# Patient Record
Sex: Male | Born: 1970 | Race: White | Hispanic: No | Smoking: Current every day smoker
Health system: Southern US, Community
[De-identification: ages and names within clinical notes are randomized; demographics above are authoritative.]

## PROBLEM LIST (undated history)

## (undated) DIAGNOSIS — I10 Essential (primary) hypertension: Secondary | ICD-10-CM

## (undated) DIAGNOSIS — I639 Cerebral infarction, unspecified: Secondary | ICD-10-CM

## (undated) DIAGNOSIS — E119 Type 2 diabetes mellitus without complications: Secondary | ICD-10-CM

## (undated) HISTORY — PX: BACK SURGERY: SHX140

## (undated) HISTORY — PX: APPENDECTOMY: SHX54

---

## 2002-09-10 ENCOUNTER — Ambulatory Visit (HOSPITAL_COMMUNITY): Admission: RE | Admit: 2002-09-10 | Discharge: 2002-09-10 | Payer: Self-pay | Admitting: Neurosurgery

## 2002-09-10 ENCOUNTER — Encounter: Payer: Self-pay | Admitting: Neurosurgery

## 2003-01-08 ENCOUNTER — Encounter: Payer: Self-pay | Admitting: Neurosurgery

## 2003-01-08 ENCOUNTER — Ambulatory Visit (HOSPITAL_COMMUNITY): Admission: RE | Admit: 2003-01-08 | Discharge: 2003-01-08 | Payer: Self-pay | Admitting: Neurosurgery

## 2003-03-04 ENCOUNTER — Ambulatory Visit (HOSPITAL_COMMUNITY): Admission: RE | Admit: 2003-03-04 | Discharge: 2003-03-04 | Payer: Self-pay | Admitting: Neurosurgery

## 2003-04-06 ENCOUNTER — Ambulatory Visit (HOSPITAL_COMMUNITY): Admission: RE | Admit: 2003-04-06 | Discharge: 2003-04-07 | Payer: Self-pay | Admitting: Neurosurgery

## 2003-05-28 ENCOUNTER — Encounter: Admission: RE | Admit: 2003-05-28 | Discharge: 2003-05-28 | Payer: Self-pay | Admitting: Neurosurgery

## 2004-03-20 ENCOUNTER — Emergency Department: Payer: Self-pay | Admitting: Internal Medicine

## 2004-09-21 ENCOUNTER — Emergency Department: Payer: Self-pay | Admitting: Emergency Medicine

## 2006-01-26 ENCOUNTER — Ambulatory Visit: Payer: Self-pay | Admitting: Unknown Physician Specialty

## 2008-01-27 ENCOUNTER — Ambulatory Visit: Payer: Self-pay | Admitting: Specialist

## 2011-01-14 ENCOUNTER — Emergency Department: Payer: Self-pay | Admitting: Emergency Medicine

## 2011-01-16 ENCOUNTER — Emergency Department: Payer: Self-pay | Admitting: Emergency Medicine

## 2013-03-09 ENCOUNTER — Emergency Department: Payer: Self-pay | Admitting: Internal Medicine

## 2013-03-09 LAB — ETHANOL
Ethanol %: <0.003 % (ref 0.000–0.080)
Ethanol: <3 mg/dL

## 2013-03-09 LAB — DRUG SCREEN, URINE

## 2013-03-09 LAB — CK TOTAL AND CKMB (NOT AT ARMC)
CK, Total: 145 U/L (ref 35–232)
CK-MB: 1.5 ng/mL (ref 0.5–3.6)

## 2013-03-09 LAB — COMPREHENSIVE METABOLIC PANEL
Albumin: 3.7 g/dL (ref 3.4–5.0)
Alkaline Phosphatase: 111 U/L (ref 50–136)
Anion Gap: 5 — ABNORMAL LOW (ref 7–16)
BUN: 15 mg/dL (ref 7–18)
Bilirubin,Total: 0.5 mg/dL (ref 0.2–1.0)
Calcium, Total: 9.2 mg/dL (ref 8.5–10.1)
Chloride: 97 mmol/L — ABNORMAL LOW (ref 98–107)
Co2: 30 mmol/L (ref 21–32)
Creatinine: 1.01 mg/dL (ref 0.60–1.30)
EGFR (African American): >60
EGFR (Non-African Amer.): >60
Glucose: 392 mg/dL — ABNORMAL HIGH (ref 65–99)
Osmolality: 282 (ref 275–301)
Potassium: 3.5 mmol/L (ref 3.5–5.1)
SGOT(AST): 30 U/L (ref 15–37)
SGPT (ALT): 57 U/L (ref 12–78)
Sodium: 132 mmol/L — ABNORMAL LOW (ref 136–145)
Total Protein: 7.1 g/dL (ref 6.4–8.2)

## 2013-03-09 LAB — CBC
HCT: 42.5 % (ref 40.0–52.0)
HGB: 15 g/dL (ref 13.0–18.0)
MCH: 30.5 pg (ref 26.0–34.0)
MCHC: 35.3 g/dL (ref 32.0–36.0)
MCV: 86 fL (ref 80–100)
Platelet: 192 10*3/uL (ref 150–440)
RBC: 4.92 10*6/uL (ref 4.40–5.90)
RDW: 12.2 % (ref 11.5–14.5)
WBC: 11 10*3/uL — ABNORMAL HIGH (ref 3.8–10.6)

## 2013-03-09 LAB — ACETAMINOPHEN LEVEL: Acetaminophen: <2 ug/mL

## 2013-03-09 LAB — TROPONIN I: Troponin-I: 0.02 ng/mL

## 2013-03-09 LAB — SALICYLATE LEVEL: Salicylates, Serum: <1.7 mg/dL

## 2013-03-09 LAB — HEMOGLOBIN A1C: Hemoglobin A1C: 12.8 % — ABNORMAL HIGH (ref 4.2–6.3)

## 2013-03-09 LAB — TSH: Thyroid Stimulating Horm: 1.08 u[IU]/mL

## 2016-09-26 ENCOUNTER — Emergency Department: Payer: Self-pay

## 2016-09-26 ENCOUNTER — Encounter: Payer: Self-pay | Admitting: Emergency Medicine

## 2016-09-26 ENCOUNTER — Emergency Department
Admission: EM | Admit: 2016-09-26 | Discharge: 2016-09-26 | Disposition: A | Discharge status: Home / Self Care | Payer: Self-pay | Attending: Emergency Medicine | Admitting: Emergency Medicine

## 2016-09-26 DIAGNOSIS — F17200 Nicotine dependence, unspecified, uncomplicated: Secondary | ICD-10-CM | POA: Insufficient documentation

## 2016-09-26 DIAGNOSIS — R739 Hyperglycemia, unspecified: Secondary | ICD-10-CM

## 2016-09-26 DIAGNOSIS — Z794 Long term (current) use of insulin: Secondary | ICD-10-CM | POA: Insufficient documentation

## 2016-09-26 DIAGNOSIS — N23 Unspecified renal colic: Secondary | ICD-10-CM

## 2016-09-26 DIAGNOSIS — E1165 Type 2 diabetes mellitus with hyperglycemia: Secondary | ICD-10-CM | POA: Insufficient documentation

## 2016-09-26 DIAGNOSIS — I10 Essential (primary) hypertension: Secondary | ICD-10-CM | POA: Insufficient documentation

## 2016-09-26 DIAGNOSIS — Z7984 Long term (current) use of oral hypoglycemic drugs: Secondary | ICD-10-CM | POA: Insufficient documentation

## 2016-09-26 DIAGNOSIS — Z79899 Other long term (current) drug therapy: Secondary | ICD-10-CM | POA: Insufficient documentation

## 2016-09-26 DIAGNOSIS — R109 Unspecified abdominal pain: Secondary | ICD-10-CM | POA: Insufficient documentation

## 2016-09-26 HISTORY — DX: Essential (primary) hypertension: I10

## 2016-09-26 HISTORY — DX: Type 2 diabetes mellitus without complications: E11.9

## 2016-09-26 HISTORY — DX: Cerebral infarction, unspecified: I63.9

## 2016-09-26 LAB — COMPREHENSIVE METABOLIC PANEL
ALT: 34 U/L (ref 17–63)
AST: 25 U/L (ref 15–41)
Albumin: 4 g/dL (ref 3.5–5.0)
Alkaline Phosphatase: 106 U/L (ref 38–126)
Anion gap: 11 (ref 5–15)
BUN: 14 mg/dL (ref 6–20)
CO2: 29 mmol/L (ref 22–32)
Calcium: 10.2 mg/dL (ref 8.9–10.3)
Chloride: 91 mmol/L — ABNORMAL LOW (ref 101–111)
Creatinine, Ser: 1.28 mg/dL — ABNORMAL HIGH (ref 0.61–1.24)
GFR calc Af Amer: >60 mL/min (ref >60)
GFR calc non Af Amer: >60 mL/min (ref >60)
Glucose, Bld: 544 mg/dL (ref 65–99)
Potassium: 3.9 mmol/L (ref 3.5–5.1)
Sodium: 131 mmol/L — ABNORMAL LOW (ref 135–145)
Total Bilirubin: 0.8 mg/dL (ref 0.3–1.2)
Total Protein: 8.3 g/dL — ABNORMAL HIGH (ref 6.5–8.1)

## 2016-09-26 LAB — TROPONIN I
Troponin I: 0.03 ng/mL (ref <0.03)
Troponin I: <0.03 ng/mL (ref <0.03)

## 2016-09-26 LAB — URINALYSIS, COMPLETE (UACMP) WITH MICROSCOPIC
Bacteria, UA: NONE SEEN
Bilirubin Urine: NEGATIVE
Glucose, UA: >=500 mg/dL — AB
Ketones, ur: NEGATIVE mg/dL
Leukocytes, UA: NEGATIVE
Nitrite: NEGATIVE
Protein, ur: 100 mg/dL — AB
Specific Gravity, Urine: 1.021 (ref 1.005–1.030)
Squamous Epithelial / HPF: NONE SEEN
pH: 6 (ref 5.0–8.0)

## 2016-09-26 LAB — GLUCOSE, CAPILLARY: Glucose-Capillary: 394 mg/dL — ABNORMAL HIGH (ref 65–99)

## 2016-09-26 LAB — CBC
HCT: 46.3 % (ref 40.0–52.0)
Hemoglobin: 16.1 g/dL (ref 13.0–18.0)
MCH: 29.7 pg (ref 26.0–34.0)
MCHC: 34.8 g/dL (ref 32.0–36.0)
MCV: 85.4 fL (ref 80.0–100.0)
Platelets: 295 10*3/uL (ref 150–440)
RBC: 5.43 MIL/uL (ref 4.40–5.90)
RDW: 12.9 % (ref 11.5–14.5)
WBC: 13.6 10*3/uL — ABNORMAL HIGH (ref 3.8–10.6)

## 2016-09-26 LAB — LIPASE, BLOOD: Lipase: 42 U/L (ref 11–51)

## 2016-09-26 MED ORDER — INSULIN ASPART 100 UNIT/ML ~~LOC~~ SOLN
10.0000 [IU] | Freq: Once | SUBCUTANEOUS | Status: AC
  Administered 2016-09-26: 10 [IU] via INTRAVENOUS
  Filled 2016-09-26: qty 10

## 2016-09-26 MED ORDER — MORPHINE SULFATE (PF) 4 MG/ML IV SOLN: 4.0000 mg | Freq: Once | INTRAVENOUS | Status: DC

## 2016-09-26 MED ORDER — ONDANSETRON HCL 4 MG PO TABS: 4.0000 mg | ORAL_TABLET | Freq: Three times a day (TID) | ORAL | 0 refills | Status: DC | PRN

## 2016-09-26 MED ORDER — IOPAMIDOL (ISOVUE-300) INJECTION 61%
100.0000 mL | Freq: Once | INTRAVENOUS | Status: AC | PRN
  Administered 2016-09-26: 100 mL via INTRAVENOUS

## 2016-09-26 MED ORDER — IOPAMIDOL (ISOVUE-300) INJECTION 61%
30.0000 mL | Freq: Once | INTRAVENOUS | Status: AC | PRN
  Administered 2016-09-26: 30 mL via ORAL

## 2016-09-26 MED ORDER — SODIUM CHLORIDE 0.9 % IV BOLUS (SEPSIS)
1000.0000 mL | Freq: Once | INTRAVENOUS | Status: AC
  Administered 2016-09-26: 1000 mL via INTRAVENOUS

## 2016-09-26 NOTE — ED Notes (Signed)
Pt alert and oriented X4, active, cooperative, pt in NAD. RR even and unlabored, color WNL.  Pt informed to return if any life threatening symptoms occur.   

## 2016-09-26 NOTE — ED Notes (Signed)
Blood Sugar 544, results from lab.  To room 2.  Morrie Sheldon RN aware of lab results and arrival in Room 2.

## 2016-09-26 NOTE — ED Notes (Signed)
Resumed care from laura rn/heather rn.   Pt up to bathroom.  Iv in place. Pt alert.

## 2016-09-26 NOTE — Discharge Instructions (Signed)
Return to the emergency room for any new or worrisome symptoms including increased pain, fever, vomiting, keep your  sugar closely watched, take your medications as prescribed and follow closely with primary care doctor tomorrow. If you cannot reach them, we have also provided the number for the local clinic. We are also giving you nausea medication. We are also giving information as needed with a urologist although you may wish to see her primary care doctor first.

## 2016-09-26 NOTE — ED Provider Notes (Signed)
[facility] Emergency Department Provider Note  ____________________________________________   I have reviewed the triage vital signs and the nursing notes.   HISTORY  Chief Complaint Abdominal Pain    HPI [name] [name] is a 46 y.o. male history of poorly 90 status post appendectomy years ago presents with right-sided middle abdominal pain. Nausea no fever no chills no vomiting, no diarrhea. Normal bowel movements. States he just doesn't feel very well. Denies chest pain shortness of breath. The pain is in the low to mid right abdomen. It is sharp, nothing makes it better, nothing makes it worse. Patient takes insulin at night and then metformin during the day. He is not sure what his normal sugars are. He states he may not have been taking his medications in the last couple days per the pain was sharp, sudden onset. Did not radiate towards the groin. No dysuria no fever.  Past Medical History:  Diagnosis Date  . Diabetes mellitus without complication (HCC)   . Hypertension   . Stroke [facility])     There are no active problems to display for this patient.   Past Surgical History:  Procedure Laterality Date  . APPENDECTOMY    . BACK SURGERY      Prior to Admission medications   Medication Sig Start Date End Date Taking? Authorizing Provider  amLODipine (NORVASC) 10 MG tablet Take 10 mg by mouth daily.   Yes [provider]  atenolol (TENORMIN) 50 MG tablet Take 50 mg by mouth daily.   Yes [provider]  atorvastatin (LIPITOR) 80 MG tablet Take 80 mg by mouth daily.   Yes [provider]  chlorthalidone (HYGROTON) 50 MG tablet Take 50 mg by mouth daily.   Yes [provider]  citalopram (CELEXA) 40 MG tablet Take 40 mg by mouth daily.   Yes [provider]  folic acid (FOLVITE) 1 MG tablet Take 1 mg by mouth daily.   Yes [provider]  insulin glargine (LANTUS) 100 UNIT/ML injection Inject 50 Units  into the skin daily.   Yes [provider]  lisinopril (PRINIVIL,ZESTRIL) 40 MG tablet Take 40 mg by mouth daily.   Yes [provider]  metFORMIN (GLUCOPHAGE) 500 MG tablet Take 2,000 mg by mouth daily with breakfast.   Yes [provider]  sertraline (ZOLOFT) 100 MG tablet Take 100 mg by mouth daily.   Yes [provider]    Allergies Penicillins  No family history on file.  Social History Social History  Substance Use Topics  . Smoking status: Current Every Day Smoker  . Smokeless tobacco: Never Used  . Alcohol use No    Review of Systems Constitutional: No fever/chills Eyes: No visual changes. ENT: No sore throat. No stiff neck no neck pain Cardiovascular: Denies chest pain. Respiratory: Denies shortness of breath. Gastrointestinal:   no vomiting.  No diarrhea.  No constipation. Genitourinary: Negative for dysuria. Musculoskeletal: Negative lower extremity swelling Skin: Negative for rash. Neurological: Negative for severe headaches, focal weakness or numbness.   ____________________________________________   PHYSICAL EXAM:  VITAL SIGNS: ED Triage Vitals  Enc Vitals Group     BP 09/26/16 1014 (!) 186/92     Pulse Rate 09/26/16 1014 78     Resp 09/26/16 1014 20     Temp 09/26/16 1014 97.5 F (36.4 C)     Temp src --      SpO2 09/26/16 1014 98 %     Weight 09/26/16 1015 184  lb (83.5 kg)     Height 09/26/16 1015 5\' 9"  (1.753 m)     Head Circumference --      Peak Flow --      Pain Score 09/26/16 1023 4     Pain Loc --      Pain Edu? --      Excl. in GC? --     Constitutional: Alert and oriented. Well appearing and in no acute distress. Eyes: Conjunctivae are normal Head: Atraumatic HEENT: No congestion/rhinnorhea. Mucous membranes are moist.  Oropharynx non-erythematous Neck:   Nontender with no meningismus, no masses, no stridor Cardiovascular: Normal rate, regular rhythm. Grossly normal heart sounds.  Good peripheral  circulation. Respiratory: Normal respiratory effort.  No retractions. Lungs CTAB. Abdominal: Positive tenderness to palpation in the mid abdominal region not exactly right upper quadrant not right lower quadrant nontender. No distention. No guarding no rebound Back:  There is no focal tenderness or step off.  there is no midline tenderness there are no lesions noted. there is Slight right CVA tenderness Musculoskeletal: No lower extremity tenderness, no upper extremity tenderness. No joint effusions, no DVT signs strong distal pulses no edema Neurologic:  Normal speech and language. No gross focal neurologic deficits are appreciated.  Skin:  Skin is warm, dry and intact. No rash noted. Psychiatric: Mood and affect are normal. Speech and behavior are normal.  ____________________________________________   LABS (all labs ordered are listed, but only abnormal results are displayed)  Labs Reviewed  COMPREHENSIVE METABOLIC PANEL - Abnormal; Notable for the following:       Result Value   Sodium 131 (*)    Chloride 91 (*)    Glucose, Bld 544 (*)    Creatinine, Ser 1.28 (*)    Total Protein 8.3 (*)    All other components within normal limits  CBC - Abnormal; Notable for the following:    WBC 13.6 (*)    All other components within normal limits  URINALYSIS, COMPLETE (UACMP) WITH MICROSCOPIC - Abnormal; Notable for the following:    Color, Urine YELLOW (*)    APPearance CLEAR (*)    Glucose, UA >=500 (*)    Hgb urine dipstick SMALL (*)    Protein, ur 100 (*)    All other components within normal limits  GLUCOSE, CAPILLARY - Abnormal; Notable for the following:    Glucose-Capillary 394 (*)    All other components within normal limits  LIPASE, BLOOD  TROPONIN I   ____________________________________________  EKG  I personally interpreted any EKGs ordered by me or triage Normal sinus rhythm rate 66 bpm, repolarization abnormality noted. Normal axis no acute  ischemia ____________________________________________  RADIOLOGY  I reviewed any imaging ordered by me or triage that were performed during my shift and, if possible, patient and/or family made aware of any abnormal findings. ____________________________________________   PROCEDURES  Procedure(s) performed: None  Procedures  Critical Care performed: None  ____________________________________________   INITIAL IMPRESSION / ASSESSMENT AND PLAN / ED COURSE  Pertinent labs & imaging results that were available during my care of the patient were reviewed by me and considered in my medical decision making (see chart for details).  Patient here with sudden onset right-sided abdominal pain and elevated sugar. Ultrasound gallbladder is reassuring blood work is reassuring evidence of DKA. We did give him insulin and fluids his sugars come down considerably, he is asymptomatic with this. CT scan was performed given elevated sugars, and risk factors for undiagnosed abdominal pathology, this shows  likely passed kidney stone. Very low suspicion for ACS given reproducible plane and CT findings however, we did do cardiac enzymes which are not going up. CT scan findings of possible passed kidney stone are consistent with symptoms which were sudden onset severe right-sided abdominal pain which is now greatly reduced. He has no tenderness or pain at this moment. He is eager to go home. We will discharge him with nausea medication and return precautions given and understood. No evidence of urinary tract infection noted.    ____________________________________________   FINAL CLINICAL IMPRESSION(S) / ED DIAGNOSES  Final diagnoses:  Abdominal pain      This chart was dictated using voice recognition software.  Despite best efforts to proofread,  errors can occur which can change meaning.      Jeanmarie Plant, MD 09/26/16 [phone]

## 2016-09-26 NOTE — ED Triage Notes (Signed)
Pt with right side abd pain started yesterday,

## 2016-12-25 ENCOUNTER — Emergency Department (HOSPITAL_COMMUNITY): Payer: Non-veteran care

## 2016-12-25 ENCOUNTER — Inpatient Hospital Stay (HOSPITAL_COMMUNITY): Payer: Non-veteran care

## 2016-12-25 ENCOUNTER — Encounter (HOSPITAL_COMMUNITY): Payer: Self-pay | Admitting: Emergency Medicine

## 2016-12-25 ENCOUNTER — Inpatient Hospital Stay (HOSPITAL_COMMUNITY)
Admission: EM | Admit: 2016-12-25 | Discharge: 2017-01-02 | DRG: 062 | Disposition: A | Discharge status: Skilled Nursing Facility | Payer: Self-pay | Attending: Neurology | Admitting: Neurology

## 2016-12-25 DIAGNOSIS — Z8673 Personal history of transient ischemic attack (TIA), and cerebral infarction without residual deficits: Secondary | ICD-10-CM | POA: Diagnosis present

## 2016-12-25 DIAGNOSIS — F17210 Nicotine dependence, cigarettes, uncomplicated: Secondary | ICD-10-CM | POA: Diagnosis present

## 2016-12-25 DIAGNOSIS — G8191 Hemiplegia, unspecified affecting right dominant side: Secondary | ICD-10-CM | POA: Diagnosis present

## 2016-12-25 DIAGNOSIS — I6930 Unspecified sequelae of cerebral infarction: Secondary | ICD-10-CM | POA: Diagnosis present

## 2016-12-25 DIAGNOSIS — Z7982 Long term (current) use of aspirin: Secondary | ICD-10-CM

## 2016-12-25 DIAGNOSIS — I639 Cerebral infarction, unspecified: Principal | ICD-10-CM | POA: Diagnosis present

## 2016-12-25 DIAGNOSIS — E785 Hyperlipidemia, unspecified: Secondary | ICD-10-CM

## 2016-12-25 DIAGNOSIS — Z9119 Patient's noncompliance with other medical treatment and regimen: Secondary | ICD-10-CM

## 2016-12-25 DIAGNOSIS — R29706 NIHSS score 6: Secondary | ICD-10-CM | POA: Diagnosis present

## 2016-12-25 DIAGNOSIS — R29810 Facial weakness: Secondary | ICD-10-CM | POA: Diagnosis present

## 2016-12-25 DIAGNOSIS — Z794 Long term (current) use of insulin: Secondary | ICD-10-CM

## 2016-12-25 DIAGNOSIS — E1151 Type 2 diabetes mellitus with diabetic peripheral angiopathy without gangrene: Secondary | ICD-10-CM | POA: Diagnosis present

## 2016-12-25 DIAGNOSIS — E119 Type 2 diabetes mellitus without complications: Secondary | ICD-10-CM

## 2016-12-25 DIAGNOSIS — I10 Essential (primary) hypertension: Secondary | ICD-10-CM

## 2016-12-25 DIAGNOSIS — Z23 Encounter for immunization: Secondary | ICD-10-CM

## 2016-12-25 DIAGNOSIS — E1165 Type 2 diabetes mellitus with hyperglycemia: Secondary | ICD-10-CM | POA: Diagnosis present

## 2016-12-25 DIAGNOSIS — I161 Hypertensive emergency: Secondary | ICD-10-CM | POA: Diagnosis present

## 2016-12-25 DIAGNOSIS — E876 Hypokalemia: Secondary | ICD-10-CM | POA: Diagnosis present

## 2016-12-25 LAB — CBC
HCT: 42.7 % (ref 39.0–52.0)
Hemoglobin: 15.6 g/dL (ref 13.0–17.0)
MCH: 29.9 pg (ref 26.0–34.0)
MCHC: 36.5 g/dL — AB (ref 30.0–36.0)
MCV: 81.8 fL (ref 78.0–100.0)
PLATELETS: 249 10*3/uL (ref 150–400)
RBC: 5.22 MIL/uL (ref 4.22–5.81)
RDW: 12.3 % (ref 11.5–15.5)
WBC: 9.4 10*3/uL (ref 4.0–10.5)

## 2016-12-25 LAB — COMPREHENSIVE METABOLIC PANEL
ALT: 24 U/L (ref 17–63)
ANION GAP: 13 (ref 5–15)
AST: 18 U/L (ref 15–41)
Albumin: 3.8 g/dL (ref 3.5–5.0)
Alkaline Phosphatase: 92 U/L (ref 38–126)
BUN: 9 mg/dL (ref 6–20)
CHLORIDE: 97 mmol/L — AB (ref 101–111)
CO2: 25 mmol/L (ref 22–32)
Calcium: 9.9 mg/dL (ref 8.9–10.3)
Creatinine, Ser: 1.02 mg/dL (ref 0.61–1.24)
GFR calc Af Amer: >60 mL/min (ref >60)
Glucose, Bld: 398 mg/dL — ABNORMAL HIGH (ref 65–99)
POTASSIUM: 3.2 mmol/L — AB (ref 3.5–5.1)
SODIUM: 135 mmol/L (ref 135–145)
Total Bilirubin: 0.6 mg/dL (ref 0.3–1.2)
Total Protein: 7.5 g/dL (ref 6.5–8.1)

## 2016-12-25 LAB — URINALYSIS, ROUTINE W REFLEX MICROSCOPIC
Bilirubin Urine: NEGATIVE
Hgb urine dipstick: NEGATIVE
KETONES UR: NEGATIVE mg/dL
Leukocytes, UA: NEGATIVE
Nitrite: NEGATIVE
SQUAMOUS EPITHELIAL / LPF: NONE SEEN
Specific Gravity, Urine: 1.026 (ref 1.005–1.030)
pH: 6 (ref 5.0–8.0)

## 2016-12-25 LAB — DIFFERENTIAL
BASOS PCT: 0 %
Basophils Absolute: 0 10*3/uL (ref 0.0–0.1)
EOS ABS: 0.1 10*3/uL (ref 0.0–0.7)
Eosinophils Relative: 1 %
Lymphocytes Relative: 27 %
Lymphs Abs: 2.6 10*3/uL (ref 0.7–4.0)
MONO ABS: 0.5 10*3/uL (ref 0.1–1.0)
Monocytes Relative: 5 %
NEUTROS ABS: 6.2 10*3/uL (ref 1.7–7.7)
Neutrophils Relative %: 67 %

## 2016-12-25 LAB — GLUCOSE, CAPILLARY: GLUCOSE-CAPILLARY: 401 mg/dL — AB (ref 65–99)

## 2016-12-25 LAB — I-STAT TROPONIN, ED: TROPONIN I, POC: 0 ng/mL (ref 0.00–0.08)

## 2016-12-25 LAB — I-STAT CHEM 8, ED
BUN: 12 mg/dL (ref 6–20)
CALCIUM ION: 1.1 mmol/L — AB (ref 1.15–1.40)
CHLORIDE: 97 mmol/L — AB (ref 101–111)
CREATININE: 0.8 mg/dL (ref 0.61–1.24)
GLUCOSE: 403 mg/dL — AB (ref 65–99)
HCT: 46 % (ref 39.0–52.0)
Hemoglobin: 15.6 g/dL (ref 13.0–17.0)
Potassium: 4.3 mmol/L (ref 3.5–5.1)
Sodium: 134 mmol/L — ABNORMAL LOW (ref 135–145)
TCO2: 29 mmol/L (ref 22–32)

## 2016-12-25 LAB — RAPID URINE DRUG SCREEN, HOSP PERFORMED
Amphetamines: NOT DETECTED
Barbiturates: NOT DETECTED
Benzodiazepines: NOT DETECTED
COCAINE: NOT DETECTED
OPIATES: NOT DETECTED
Tetrahydrocannabinol: NOT DETECTED

## 2016-12-25 LAB — APTT: aPTT: 22 seconds — ABNORMAL LOW (ref 24–36)

## 2016-12-25 LAB — PROTIME-INR
INR: 0.89
Prothrombin Time: 12 seconds (ref 11.4–15.2)

## 2016-12-25 LAB — ETHANOL: Alcohol, Ethyl (B): <5 mg/dL (ref <5)

## 2016-12-25 MED ORDER — ACETAMINOPHEN 650 MG RE SUPP
650.0000 mg | RECTAL | Status: DC | PRN
Start: 2016-12-25 — End: 2017-01-02

## 2016-12-25 MED ORDER — PANTOPRAZOLE SODIUM 40 MG IV SOLR
40.0000 mg | Freq: Every day | INTRAVENOUS | Status: DC
  Administered 2016-12-26 – 2016-12-27 (×3): 40 mg via INTRAVENOUS
  Filled 2016-12-25 (×3): qty 40

## 2016-12-25 MED ORDER — INSULIN ASPART 100 UNIT/ML ~~LOC~~ SOLN: 2.0000 [IU] | SUBCUTANEOUS | Status: DC

## 2016-12-25 MED ORDER — POTASSIUM CHLORIDE 10 MEQ/100ML IV SOLN
10.0000 meq | INTRAVENOUS | Status: AC
  Administered 2016-12-25 (×2): 10 meq via INTRAVENOUS
  Filled 2016-12-25 (×2): qty 100

## 2016-12-25 MED ORDER — STROKE: EARLY STAGES OF RECOVERY BOOK
Freq: Once | Status: AC
  Administered 2016-12-26
  Filled 2016-12-25: qty 1

## 2016-12-25 MED ORDER — CLEVIDIPINE BUTYRATE 0.5 MG/ML IV EMUL
0.0000 mg/h | INTRAVENOUS | Status: DC
  Administered 2016-12-26: 4 mg/h via INTRAVENOUS
  Administered 2016-12-26: 12 mg/h via INTRAVENOUS
  Administered 2016-12-26 (×2): 10 mg/h via INTRAVENOUS
  Administered 2016-12-26: 6 mg/h via INTRAVENOUS
  Administered 2016-12-26: 4 mg/h via INTRAVENOUS
  Administered 2016-12-26: 12 mg/h via INTRAVENOUS
  Administered 2016-12-26: 4 mg/h via INTRAVENOUS
  Administered 2016-12-26: 12 mg/h via INTRAVENOUS
  Filled 2016-12-25 (×9): qty 50

## 2016-12-25 MED ORDER — ALTEPLASE (STROKE) FULL DOSE INFUSION
0.9000 mg/kg | Freq: Once | INTRAVENOUS | Status: AC
  Administered 2016-12-25: 76 mg via INTRAVENOUS

## 2016-12-25 MED ORDER — SODIUM CHLORIDE 0.9 % IV SOLN
INTRAVENOUS | Status: DC
  Administered 2016-12-25 – 2016-12-29 (×5): via INTRAVENOUS

## 2016-12-25 MED ORDER — ACETAMINOPHEN 325 MG PO TABS
650.0000 mg | ORAL_TABLET | ORAL | Status: DC | PRN
  Administered 2016-12-29 – 2016-12-31 (×2): 650 mg via ORAL
  Filled 2016-12-25 (×3): qty 2

## 2016-12-25 MED ORDER — SODIUM CHLORIDE 0.9 % IV SOLN
50.0000 mL | Freq: Once | INTRAVENOUS | Status: AC
  Administered 2016-12-25: 50 mL via INTRAVENOUS

## 2016-12-25 MED ORDER — CLEVIDIPINE BUTYRATE 0.5 MG/ML IV EMUL
0.0000 mg/h | INTRAVENOUS | Status: DC
  Administered 2016-12-25: 4 mg/h via INTRAVENOUS
  Filled 2016-12-25: qty 50

## 2016-12-25 MED ORDER — LABETALOL HCL 5 MG/ML IV SOLN
20.0000 mg | Freq: Once | INTRAVENOUS | Status: AC
  Administered 2016-12-25: 20 mg via INTRAVENOUS

## 2016-12-25 MED ORDER — ACETAMINOPHEN 160 MG/5ML PO SOLN
650.0000 mg | ORAL | Status: DC | PRN
Start: 2016-12-25 — End: 2017-01-02

## 2016-12-25 MED ORDER — IOPAMIDOL (ISOVUE-370) INJECTION 76%: 50.0000 mL | Freq: Once | INTRAVENOUS | Status: DC | PRN

## 2016-12-25 NOTE — ED Notes (Signed)
tPA ready, working on BP

## 2016-12-25 NOTE — ED Notes (Signed)
Up to 8 on cleviprex, BP 202/131

## 2016-12-25 NOTE — ED Notes (Signed)
SP02 down to 82% on room air when pt removes [place]. Prompted to replace.

## 2016-12-25 NOTE — ED Notes (Signed)
Gum bleeding continues. No other bleeding sites.

## 2016-12-25 NOTE — ED Notes (Signed)
Family at bedside. 

## 2016-12-25 NOTE — ED Notes (Signed)
Up to 16MG  on cleviprex, BP200/126

## 2016-12-25 NOTE — ED Notes (Signed)
Dr. Amada Jupiter aware of progressive weakness in R hand.

## 2016-12-25 NOTE — ED Notes (Signed)
Repositioned.

## 2016-12-25 NOTE — ED Notes (Signed)
TPA start time NOW

## 2016-12-25 NOTE — ED Notes (Signed)
Pt arrives via EMS as a code stroke via [facility]. Was driving in car with sister at onset, began vomiting shortly after slurred speech began.

## 2016-12-25 NOTE — ED Notes (Signed)
Dr. Amada Jupiter at bedside, restart tPA despite gum bleeding. RR RN Verlon Au at bedside.

## 2016-12-25 NOTE — ED Notes (Signed)
cleviprex down to 12

## 2016-12-25 NOTE — ED Notes (Addendum)
Per Dr. Amada Jupiter, avoid anxiety medication at this time with sedating side effects in order to monitor mental status Updated family and patient of this plan. Turned up air, provided cool washcloth and ice chips to relieve anxiety. Coughing a bit on ice chips. Pt aggreeable to this. Opened the curtain to room to [employer].

## 2016-12-25 NOTE — ED Provider Notes (Signed)
[facility]-EMERGENCY DEPT Provider Note   CSN: [id number] Arrival date & time: 12/25/16  1906     History   Chief Complaint No chief complaint on file.   HPI [name] [name] is a 46 y.o. male.  HPI  The pt is a 46 y/o male - has hx of prior ischemic stroke causing L sided weakness - on ASA daily - was normal until 40 minutes prior to arrival at 6:30 PM when he had acute onset of slurred speech and R sided weakness in arm and leg - improved slightly en route but has had constant sx - noted to be hyperglycemic > 400 prior to arrival by EMS - code stroke was not activated in the field.  Pt denies seizures, headache, vomiting, visual changes, C P or SOB.  Sx are constant. tob use + BP noted to be 233 / 140's prehospital  Past Medical History:  Diagnosis Date  . Diabetes mellitus without complication (HCC)   . Hypertension   . Stroke [facility])     There are no active problems to display for this patient.   Past Surgical History:  Procedure Laterality Date  . APPENDECTOMY    . BACK SURGERY         Home Medications    Prior to Admission medications   Medication Sig Start Date End Date Taking? Authorizing Provider  amLODipine (NORVASC) 10 MG tablet Take 10 mg by mouth daily.    [provider]  atenolol (TENORMIN) 50 MG tablet Take 50 mg by mouth daily.    [provider]  atorvastatin (LIPITOR) 80 MG tablet Take 80 mg by mouth daily.    [provider]  chlorthalidone (HYGROTON) 50 MG tablet Take 50 mg by mouth daily.    [provider]  citalopram (CELEXA) 40 MG tablet Take 40 mg by mouth daily.    [provider]  folic acid (FOLVITE) 1 MG tablet Take 1 mg by mouth daily.    [provider]  insulin glargine (LANTUS) 100 UNIT/ML injection Inject 50 Units into the skin daily.    [provider]  lisinopril (PRINIVIL,ZESTRIL) 40 MG tablet Take 40 mg by mouth daily.    [provider]  metFORMIN (GLUCOPHAGE) 500  MG tablet Take 2,000 mg by mouth daily with breakfast.    [provider]  ondansetron (ZOFRAN) 4 MG tablet Take 1 tablet (4 mg total) by mouth every 8 (eight) hours as needed for nausea or vomiting. 09/26/16   Jeanmarie Plant, MD  sertraline (ZOLOFT) 100 MG tablet Take 100 mg by mouth daily.    [provider]    Family History No family history on file.  Social History Social History  Substance Use Topics  . Smoking status: Current Every Day Smoker  . Smokeless tobacco: Never Used  . Alcohol use No     Allergies   Penicillins   Review of Systems Review of Systems  All other systems reviewed and are negative.    Physical Exam Updated Vital Signs BP (!) 156/97   Pulse (!) 103   Temp 99 F (37.2 C)   Resp (!) 22   Wt 84.9 kg (187 lb 3.2 oz)   SpO2 93%   BMI 27.64 kg/m   Physical Exam  Constitutional: He appears well-developed and well-nourished. No distress.  HENT:  Head: Normocephalic and atraumatic.  Mouth/Throat: Oropharynx is clear and moist. No oropharyngeal exudate.  Eyes: Pupils are equal, round, and reactive to light. Conjunctivae  and EOM are normal. Right eye exhibits no discharge. Left eye exhibits no discharge. No scleral icterus.  Neck: Normal range of motion. Neck supple. No JVD present. No thyromegaly present.  Cardiovascular: Regular rhythm, normal heart sounds and intact distal pulses.  Exam reveals no gallop and no friction rub.   No murmur heard. Mild tachycardia  Pulmonary/Chest: Effort normal and breath sounds normal. No respiratory distress. He has no wheezes. He has no rales.  Abdominal: Soft. Bowel sounds are normal. He exhibits no distension and no mass. There is no tenderness.  Musculoskeletal: Normal range of motion. He exhibits no edema or tenderness.  Lymphadenopathy:    He has no cervical adenopathy.  Neurological: He is alert. Coordination normal.  Slight R arm and leg weakness (slight). R sided facial droop and  slurred speech, no neglect, normal peripheral visual fields and normal EOM.  Skin: Skin is warm and dry. No rash noted. No erythema.  Psychiatric: He has a normal mood and affect. His behavior is normal.  Nursing note and vitals reviewed.    ED Treatments / Results  Labs (all labs ordered are listed, but only abnormal results are displayed) Labs Reviewed  APTT - Abnormal; Notable for the following:       Result Value   aPTT 22 (*)    All other components within normal limits  CBC - Abnormal; Notable for the following:    MCHC 36.5 (*)    All other components within normal limits  I-STAT CHEM 8, ED - Abnormal; Notable for the following:    Sodium 134 (*)    Chloride 97 (*)    Glucose, Bld 403 (*)    Calcium, Ion 1.10 (*)    All other components within normal limits  PROTIME-INR  DIFFERENTIAL  ETHANOL  COMPREHENSIVE METABOLIC PANEL  RAPID URINE DRUG SCREEN, HOSP PERFORMED  URINALYSIS, ROUTINE W REFLEX MICROSCOPIC  I-STAT TROPONIN, ED    EKG  EKG Interpretation None       Radiology Ct Head Code Stroke Wo Contrast  Result Date: 12/25/2016 CLINICAL DATA:  Code stroke. 46 year old male with right side weakness. Last seen normal 1830 hours. EXAM: CT HEAD WITHOUT CONTRAST TECHNIQUE: Contiguous axial images were obtained from the base of the skull through the vertex without intravenous contrast. COMPARISON:  Head CT 01/15/2011. FINDINGS: Brain: Age advanced cerebral volume loss, significantly progressed since 2012. New patchy scattered bilateral cerebral white matter hypodensity. 3-4 mm hypodensity in the medial left thalamus. Chronic appearing lacunar infarct in the right lentiform. No acute intracranial hemorrhage identified. No midline shift, mass effect, or evidence of intracranial mass lesion. Ex vacuo appearing ventricular enlargement is new since 2012. No cortically based acute infarct identified. Vascular: Mild Calcified atherosclerosis at the skull base. No suspicious  intracranial vascular hyperdensity. Skull: Negative. No acute osseous abnormality identified. Stable appearing C3-C4 ACDF changes on the scout view today and in 2012. Sinuses/Orbits: Visualized paranasal sinuses and mastoids are stable and well pneumatized. Other: Visualized orbit soft tissues are within normal limits. Visualized scalp soft tissues are within normal limits. ASPECTS [facility] Stroke Program Early CT Score) - Ganglionic level infarction (caudate, lentiform nuclei, internal capsule, insula, M1-M3 cortex): 7 - Supraganglionic infarction (M4-M6 cortex): 3 Total score (0-10 with 10 being normal): 10 IMPRESSION: 1. No acute cortically based infarct or acute intracranial hemorrhage identified. 2. ASPECTS is 10. 3. Markedly advanced cerebral volume loss and new evidence of bilateral small vessel ischemia since a prior head CT in 2012. 4. The above was  relayed via text pager to Dr. Ritta Slot on 12/25/2016 at 19:27 . Electronically Signed   By: Odessa Fleming M.D.   On: 12/25/2016 19:28    Procedures Procedures (including critical care time)  Medications Ordered in ED Medications  labetalol (NORMODYNE,TRANDATE) injection 20 mg (not administered)  iopamidol (ISOVUE-370) 76 % injection 50 mL (not administered)  alteplase (ACTIVASE) 1 mg/mL infusion 76 mg (not administered)    Followed by  0.9 %  sodium chloride infusion (not administered)  clevidipine (CLEVIPREX) infusion 0.5 mg/mL (not administered)     Initial Impression / Assessment and Plan / ED Course  I have reviewed the triage vital signs and the nursing notes.  Pertinent labs & imaging results that were available during my care of the patient were reviewed by me and considered in my medical decision making (see chart for details).    BP is very high - does not qualify for TPA unless treated Labetalol ordered with agreement of Neurolgosit Dr. Amada Jupiter [facility] at the bedside to assist with meds Pt is criticall ill with acute  onset of stroke like syndrome - Ct shows no hemorrhage.  D/w Dr. Amada Jupiter - agreeable to TPA and admit to ICU   CRITICAL CARE Performed by: Vida Roller Total critical care time: 35 minutes Critical care time was exclusive of separately billable procedures and treating other patients. Critical care was necessary to treat or prevent imminent or life-threatening deterioration. Critical care was time spent personally by me on the following activities: development of treatment plan with patient and/or surrogate as well as nursing, discussions with consultants, evaluation of patient's response to treatment, examination of patient, obtaining history from patient or surrogate, ordering and performing treatments and interventions, ordering and review of laboratory studies, ordering and review of radiographic studies, pulse oximetry and re-evaluation of patient's condition.   Final Clinical Impressions(s) / ED Diagnoses   Final diagnoses:  Acute ischemic stroke [facility])  Hypertensive emergency    New Prescriptions New Prescriptions   No medications on file     Eber Hong, MD 12/25/16 1944

## 2016-12-25 NOTE — ED Notes (Signed)
No swallow screen done in ED - tPA actively infusing.

## 2016-12-25 NOTE — H&P (Signed)
Neurology H&P  CC: Facial droop  History is obtained from: Patient  HPI: [name] [name] is a 46 y.o. male who was last seen well 6:30 PM which time he began experiencing slurred speech, difficult walking, facial droop. He has a history of diabetes and hypertension and smokes 2-3 cigarettes per day.  After arrival, he was taken for a stat head CT which was negative, was within the time window for TPA he received  It after some difficulty controlling his blood pressure.  LKW: 6:30 PM tpa given?: Yes Modified Rankin Score: 1  ROS: A 14 point ROS was performed and is negative except as noted in the HPI.   Past Medical History:  Diagnosis Date  . Diabetes mellitus without complication (HCC)   . Hypertension   . Stroke [facility])      Family history: No history of stroke   Social History:  reports that he has been smoking.  He has never used smokeless tobacco. He reports that he does not drink alcohol or use drugs.   Exam: Current vital signs: BP (!) 149/103   Pulse (!) 102   Temp 99 F (37.2 C)   Resp (!) 22   Wt 84.9 kg (187 lb 3.2 oz)   SpO2 (!) 88%   BMI 27.64 kg/m  Vital signs in last 24 hours: Temp:  [99 F (37.2 C)] 99 F (37.2 C) (09/03 1916) Pulse Rate:  [92-103] 102 (09/03 1945) Resp:  [22-104] 22 (09/03 1933) BP: (149-206)/(97-126) 149/103 (09/03 1945) SpO2:  [88 %-94 %] 88 % (09/03 1945) Weight:  [84.9 kg (187 lb 3.2 oz)] 84.9 kg (187 lb 3.2 oz) (09/03 1907)  Physical Exam  Constitutional: Appears well-developed and well-nourished.  Psych: Affect appropriate to situation Eyes: No scleral injection HENT: No OP obstrucion Head: Normocephalic.  Cardiovascular: Normal rate and regular rhythm.  Respiratory: Effort normal and breath sounds normal to anterior ascultation GI: Soft.  No distension. There is no tenderness.  Skin: WDI  Neuro: Mental Status: Patient is awake, alert, oriented to person, place, month, year, and situation. Patient is able to give a  clear and coherent history. No signs of aphasia or neglect Cranial Nerves: II: Visual Fields are full. Pupils are equal, round, and reactive to light.   III,IV, VI: EOMI without ptosis or diploplia.  V: Facial sensation is symmetric to pin VII: Facial movement is notable for right facial droop VIII: hearing is intact to voice X: Uvula elevates symmetrically XI: Shoulder shrug is symmetric. XII: tongue is midline without atrophy or fasciculations.  Motor: Tone is normal. Bulk is normal. He has mild drift to the right arm and leg, but possibly more due to ataxia and weakness, 4+5 Sensory: Sensation is symmetric to light touch and pin in the arms and legs. Cerebellar: He has ataxia in the right arm> right leg. I question mild ataxia in the left leg, but this could be more due to effort. Intact in the left arm.   I have reviewed labs in epic and the results pertinent to this consultation are: CMP-hyperglycemia at 398, hypokalemia at 3.2  I have reviewed the images obtained: CT head-significant atrophy progressed since 2012, CTA-no large vessel occlusion  Impression: 46 year old male with history of hypertension and diabetes who presents with signs of what is likely a small brainstem stroke. He was within the timeframe for IV TPA and received this.  Recommendations: 1. HgbA1c, fasting lipid panel 2. MRI the brain without contrast 3. Frequent neuro checks 4. Echocardiogram  5. Prophylactic therapy- none for 24 hour 6. Risk factor modification 7. Telemetry monitoring 8. PT consult, OT consult, Speech consult 9. ICU hyperglycemia protocol for diabetes 10. 20 mEQ IV potassium for hypokalemia 11. IV cleviprex for accelerated hypertension 12. please page stroke NP  Or  PA  Or MD  from 8am -4 pm as this patient will be followed by the stroke team at this point.   You can look them up on [email or web address]      This patient is critically ill and at significant risk of neurological worsening,  death and care requires constant monitoring of vital signs, hemodynamics,respiratory and cardiac monitoring, neurological assessment, discussion with family, other specialists and medical decision making of high complexity. I spent 45 minutes of neurocritical care time  in the care of  this patient.  Ritta Slot, MD [facility] [phone]  If 7pm- 7am, please page neurology on call as listed in AMION. 12/25/2016  7:59 PM

## 2016-12-25 NOTE — ED Notes (Signed)
Pt c/o increased anxiety/claustophobia. Requests medications, text page sent to Dr. Amada Jupiter.

## 2016-12-25 NOTE — ED Notes (Signed)
Remains dyshaphic , other s/s improving.

## 2016-12-26 ENCOUNTER — Inpatient Hospital Stay (HOSPITAL_COMMUNITY): Payer: Non-veteran care

## 2016-12-26 ENCOUNTER — Other Ambulatory Visit (HOSPITAL_COMMUNITY): Payer: Non-veteran care

## 2016-12-26 DIAGNOSIS — R002 Palpitations: Secondary | ICD-10-CM

## 2016-12-26 DIAGNOSIS — I6300 Cerebral infarction due to thrombosis of unspecified precerebral artery: Secondary | ICD-10-CM

## 2016-12-26 LAB — GLUCOSE, CAPILLARY
GLUCOSE-CAPILLARY: 339 mg/dL — AB (ref 65–99)
GLUCOSE-CAPILLARY: 304 mg/dL — AB (ref 65–99)
GLUCOSE-CAPILLARY: 198 mg/dL — AB (ref 65–99)
GLUCOSE-CAPILLARY: 183 mg/dL — AB (ref 65–99)
GLUCOSE-CAPILLARY: 149 mg/dL — AB (ref 65–99)
GLUCOSE-CAPILLARY: 129 mg/dL — AB (ref 65–99)
GLUCOSE-CAPILLARY: 242 mg/dL — AB (ref 65–99)
GLUCOSE-CAPILLARY: 272 mg/dL — AB (ref 65–99)
GLUCOSE-CAPILLARY: 255 mg/dL — AB (ref 65–99)
GLUCOSE-CAPILLARY: 229 mg/dL — AB (ref 65–99)
Glucose-Capillary: 124 mg/dL — ABNORMAL HIGH (ref 65–99)
Glucose-Capillary: 187 mg/dL — ABNORMAL HIGH (ref 65–99)
Glucose-Capillary: 223 mg/dL — ABNORMAL HIGH (ref 65–99)
Glucose-Capillary: 261 mg/dL — ABNORMAL HIGH (ref 65–99)
Glucose-Capillary: 395 mg/dL — ABNORMAL HIGH (ref 65–99)
Glucose-Capillary: 455 mg/dL — ABNORMAL HIGH (ref 65–99)

## 2016-12-26 LAB — ECHOCARDIOGRAM COMPLETE
AVLVOTPG: 6 mmHg
E decel time: 198 msec
E/e' ratio: 7.87
FS: 25 % — AB (ref 28–44)
Height: 69 in
IV/PV OW: 1.09
LA ID, A-P, ES: 32 mm
LA diam end sys: 32 mm
LA diam index: 1.61 cm/m2
LA vol A4C: 31.1 ml
LA vol: 38.3 mL
LAVOLIN: 19.2 mL/m2
LDCA: 2.54 cm2
LV E/e'average: 7.87
LV PW d: 12.8 mm — AB (ref 0.6–1.1)
LV TDI E'LATERAL: 9.36
LV TDI E'MEDIAL: 7.4
LVEEMED: 7.87
LVELAT: 9.36 cm/s
LVOT SV: 65 mL
LVOT VTI: 25.5 cm
LVOT diameter: 18 mm
LVOTPV: 121 cm/s
Lateral S' vel: 15.9 cm/s
MV Dec: 198
MV pk A vel: 97.4 m/s
MV pk E vel: 73.7 m/s
MVPG: 2 mmHg
RV TAPSE: 20.3 mm
WEIGHTICAEL: 2917.13 [oz_av]

## 2016-12-26 LAB — LIPID PANEL
Cholesterol: 222 mg/dL — ABNORMAL HIGH (ref 0–200)
HDL: 39 mg/dL — ABNORMAL LOW (ref >40)
LDL CALC: UNDETERMINED mg/dL (ref 0–99)
TRIGLYCERIDES: 439 mg/dL — AB (ref <150)
Total CHOL/HDL Ratio: 5.7 RATIO
VLDL: UNDETERMINED mg/dL (ref 0–40)

## 2016-12-26 LAB — HEMOGLOBIN A1C
Hgb A1c MFr Bld: 12.3 % — ABNORMAL HIGH (ref 4.8–5.6)
Mean Plasma Glucose: 306.31 mg/dL

## 2016-12-26 LAB — COMPREHENSIVE METABOLIC PANEL
ALT: 26 U/L (ref 17–63)
AST: 25 U/L (ref 15–41)
Albumin: 3.6 g/dL (ref 3.5–5.0)
Alkaline Phosphatase: 86 U/L (ref 38–126)
Anion gap: 13 (ref 5–15)
BUN: 10 mg/dL (ref 6–20)
CHLORIDE: 100 mmol/L — AB (ref 101–111)
CO2: 23 mmol/L (ref 22–32)
CREATININE: 1.02 mg/dL (ref 0.61–1.24)
Calcium: 9.9 mg/dL (ref 8.9–10.3)
GFR calc Af Amer: >60 mL/min (ref >60)
Glucose, Bld: 363 mg/dL — ABNORMAL HIGH (ref 65–99)
POTASSIUM: 3.5 mmol/L (ref 3.5–5.1)
SODIUM: 136 mmol/L (ref 135–145)
Total Bilirubin: 0.9 mg/dL (ref 0.3–1.2)
Total Protein: 7.5 g/dL (ref 6.5–8.1)

## 2016-12-26 LAB — HIV ANTIBODY (ROUTINE TESTING W REFLEX): HIV SCREEN 4TH GENERATION: NONREACTIVE

## 2016-12-26 LAB — MRSA PCR SCREENING: MRSA BY PCR: NEGATIVE

## 2016-12-26 MED ORDER — PNEUMOCOCCAL VAC POLYVALENT 25 MCG/0.5ML IJ INJ
0.5000 mL | INJECTION | INTRAMUSCULAR | Status: AC
  Administered 2016-12-27: 0.5 mL via INTRAMUSCULAR
  Filled 2016-12-26: qty 0.5

## 2016-12-26 MED ORDER — LORAZEPAM 2 MG/ML IJ SOLN
1.0000 mg | Freq: Once | INTRAMUSCULAR | Status: AC | PRN
  Administered 2016-12-26: 1 mg via INTRAVENOUS
  Filled 2016-12-26: qty 1

## 2016-12-26 MED ORDER — SODIUM CHLORIDE 0.9 % IV SOLN
INTRAVENOUS | Status: DC
  Administered 2016-12-26: 4 [IU]/h via INTRAVENOUS
  Filled 2016-12-26: qty 1

## 2016-12-26 MED ORDER — LIVING WELL WITH DIABETES BOOK
Freq: Once | Status: DC
  Filled 2016-12-26: qty 1

## 2016-12-26 MED ORDER — HALOPERIDOL LACTATE 5 MG/ML IJ SOLN
1.0000 mg | Freq: Once | INTRAMUSCULAR | Status: AC | PRN
  Administered 2016-12-26: 1 mg via INTRAVENOUS
  Filled 2016-12-26: qty 1

## 2016-12-26 MED ORDER — INSULIN GLARGINE 100 UNIT/ML ~~LOC~~ SOLN
20.0000 [IU] | Freq: Every day | SUBCUTANEOUS | Status: DC
  Administered 2016-12-26 – 2017-01-02 (×8): 20 [IU] via SUBCUTANEOUS
  Filled 2016-12-26 (×8): qty 0.2

## 2016-12-26 MED ORDER — RESOURCE THICKENUP CLEAR PO POWD
ORAL | Status: DC | PRN
  Filled 2016-12-26 (×2): qty 125

## 2016-12-26 MED ORDER — INSULIN ASPART 100 UNIT/ML ~~LOC~~ SOLN
0.0000 [IU] | SUBCUTANEOUS | Status: DC
  Administered 2016-12-26: 5 [IU] via SUBCUTANEOUS
  Administered 2016-12-26: 8 [IU] via SUBCUTANEOUS
  Administered 2016-12-26: 5 [IU] via SUBCUTANEOUS
  Administered 2016-12-27: 8 [IU] via SUBCUTANEOUS
  Administered 2016-12-27: 3 [IU] via SUBCUTANEOUS
  Administered 2016-12-27: 8 [IU] via SUBCUTANEOUS
  Administered 2016-12-27: 5 [IU] via SUBCUTANEOUS
  Administered 2016-12-27: 8 [IU] via SUBCUTANEOUS
  Administered 2016-12-28: 5 [IU] via SUBCUTANEOUS
  Administered 2016-12-28 (×3): 8 [IU] via SUBCUTANEOUS
  Administered 2016-12-28: 5 [IU] via SUBCUTANEOUS
  Administered 2016-12-28: 3 [IU] via SUBCUTANEOUS
  Administered 2016-12-29: 8 [IU] via SUBCUTANEOUS
  Administered 2016-12-29: 5 [IU] via SUBCUTANEOUS
  Administered 2016-12-29 (×2): 2 [IU] via SUBCUTANEOUS
  Administered 2016-12-29: 5 [IU] via SUBCUTANEOUS
  Administered 2016-12-29: 8 [IU] via SUBCUTANEOUS
  Administered 2016-12-30: 7 [IU] via SUBCUTANEOUS
  Administered 2016-12-30: 2 [IU] via SUBCUTANEOUS
  Administered 2016-12-30: 8 [IU] via SUBCUTANEOUS
  Administered 2016-12-30: 3 [IU] via SUBCUTANEOUS
  Administered 2016-12-30: 5 [IU] via SUBCUTANEOUS
  Administered 2016-12-31: 2 [IU] via SUBCUTANEOUS
  Administered 2016-12-31: 8 [IU] via SUBCUTANEOUS
  Administered 2016-12-31: 3 [IU] via SUBCUTANEOUS
  Administered 2016-12-31 – 2017-01-01 (×4): 5 [IU] via SUBCUTANEOUS
  Administered 2017-01-01 (×2): 2 [IU] via SUBCUTANEOUS

## 2016-12-26 NOTE — Progress Notes (Signed)
  Echocardiogram 2D Echocardiogram has been performed.  [name] [name] 12/26/2016, 4:29 PM

## 2016-12-26 NOTE — Plan of Care (Signed)
Problem: Nutrition: Goal: Risk of aspiration will decrease Outcome: Progressing SLP evaluated pt - diet of honey thick liquids at this time d/t risk of aspiration. Goal: Dietary intake will improve Outcome: Progressing SLP determined pt able to have honey thick liquids at this time.

## 2016-12-26 NOTE — Progress Notes (Signed)
STROKE TEAM PROGRESS NOTE   HISTORY OF PRESENT ILLNESS (per record) [name] [name] is a 45 y.o. male with a history of DM2, HTN, and current tobacco abuse (2-3 cigarettes per day), LSN 6:30 PM on 12/25/2016, who presented with slurred speech, difficulty walking, L-sided weakness, and L-sided facial droop.  After arrival, he was taken for a stat head CT, which was negative.  He was within the time window for tPA, which, after some difficulty controlling his blood pressure, was administered at 2027 on 12/25/2016.  MRI brain with 9 mm acute infarct in the posterior limb left internal capsule.  LKW: 6:30 PM on 12/25/2016 Modified Rankin Score: 1  Patient was administered IV t-PA at [address]217 on 12/25/2016. He was admitted to the neuro ICU for further evaluation and treatment.   SUBJECTIVE (INTERVAL HISTORY) His nurse is at the bedside.  Pt is very dysarthric.  Pt has sinus tachycardia to 120s on the monitor.  Pt reports only moderate alcohol intake, which his sister confirmed.  Serum ethanol < 5.  Will continue to monitor for any evolving sepsis secondary to aspiration.     OBJECTIVE Temp:  [98 F (36.7 C)-99 F (37.2 C)] 98.2 F (36.8 C) (09/04 0400) Pulse Rate:  [86-129] 92 (09/04 0730) Cardiac Rhythm: Normal sinus rhythm;Sinus tachycardia (09/04 0400) Resp:  [0-104] 14 (09/04 0730) BP: (124-206)/(81-126) 143/86 (09/04 0730) SpO2:  [86 %-96 %] 95 % (09/04 0730) Weight:  [84.9 kg (187 lb 3.2 oz)] 84.9 kg (187 lb 3.2 oz) (09/03 1907)  CBC:   Recent Labs Lab 12/25/16 1902 12/25/16 1922  WBC 9.4  --   NEUTROABS 6.2  --   HGB 15.6 15.6  HCT 42.7 46.0  MCV 81.8  --   PLT 249  --     Basic Metabolic Panel:   Recent Labs Lab 12/25/16 1902 12/25/16 1922  NA 135 134*  K 3.2* 4.3  CL 97* 97*  CO2 25  --   GLUCOSE 398* 403*  BUN 9 12  CREATININE 1.02 0.80  CALCIUM 9.9  --     Lipid Panel:     Component Value Date/Time   CHOL 222 (H) 12/26/2016 0226   TRIG 439 (H) 12/26/2016 0226    HDL 39 (L) 12/26/2016 0226   CHOLHDL 5.7 12/26/2016 0226   VLDL UNABLE TO CALCULATE IF TRIGLYCERIDE OVER 400 mg/dL 16/01/9603 5409   LDLCALC UNABLE TO CALCULATE IF TRIGLYCERIDE OVER 400 mg/dL 81/19/1478 2956   OZHY8M:  Lab Results  Component Value Date   HGBA1C 12.3 (H) 12/26/2016   Urine Drug Screen:     Component Value Date/Time   LABOPIA NONE DETECTED 12/25/2016 1949   COCAINSCRNUR NONE DETECTED 12/25/2016 1949   COCAINSCRNUR NEGATIVE 03/09/2013 1841   LABBENZ NONE DETECTED 12/25/2016 1949   AMPHETMU NONE DETECTED 12/25/2016 1949   THCU NONE DETECTED 12/25/2016 1949   LABBARB NONE DETECTED 12/25/2016 1949    Alcohol Level     Component Value Date/Time   ETH <5 12/25/2016 1902    IMAGING  Ct Angio Head W Or Wo Contrast Ct Angio Neck W And/or Wo Contrast 12/25/2016 IMPRESSION: 1. Negative for emergent large vessel occlusion but positive for severe stenosis of the right PCA P1 and P2 segments. 2. No arterial occlusion identified. Mild or up to moderate irregularity of other circle of Willis branches, including the left PCA and left MCA. 3. No Basilar Artery or extracranial atherosclerosis or stenosis. 4. No acute findings in the neck.  Prior ACDF.  Ct Head Wo Contrast 12/25/2016 IMPRESSION: 1. No acute intracranial abnormality identified. 2. Stable advanced chronic microvascular ischemic changes of the brain for age with multiple chronic lacunar infarcts in basal ganglia and the pons.   Ct Head Code Stroke Wo Contrast 12/25/2016 IMPRESSION: 1. No acute cortically based infarct or acute intracranial hemorrhage identified. 2. ASPECTS is 10. 3. Markedly advanced cerebral volume loss and new evidence of bilateral small vessel ischemia since a prior head CT in 2012. 4.  MRI Brain Wo Contrast 12/26/2016 IMPRESSION: 1. 9 mm acute infarct in the posterior limb left internal capsule. 2. Chronic small vessel ischemia with multiple remote lacunar infarcts, extensive for  age.  TTE 12/26/2016  pending       PHYSICAL EXAM Pleasant middle-age Caucasian male currently not in distress. . Afebrile. Head is nontraumatic. Neck is supple without bruit.    Cardiac exam no murmur or gallop. Lungs are clear to auscultation. Distal pulses are well felt. Blood pressure (!) 149/99, pulse 94, temperature 98.1 F (36.7 C), temperature source Axillary, resp. rate 16, height 5\' 9"  (1.753 m), weight 182 lb 5.1 oz (82.7 kg), SpO2 97 %. Neurological exam ;  Awake alert oriented 3. Severe dysarthria but can be understood. No aphasia. Extraocular moments are full range but he has saccadic dysmetria in either direction. Mild right lower facial weakness. Tongue midline. Weak cough and gag. Motor system exam no upper or lower extremity drift but mild weakness of right grip and intrinsic hand muscles. Fine finger movements are diminished on the right. Orbits left over right apex daily. Symmetric lower extremity strength. Deep tendon reflexes are symmetric. Sensation intact. Plantars downgoing. Gait not tested. ASSESSMENT/PLAN [name] is a 46 y.o. male with history of DM2, HTN, and current tobacco abuse (2-3 cigarettes per day), LSN 6:30 PM on 12/25/2016, who presented with slurred speech, difficulty walking, L-sided weakness, and L-sided facial droop. He received IV t-PA at 2027 on 12/25/2016.   Stroke: 9 mm acute infarct in the posterior limb left internal capsule in the setting of advanced small vessel disease, multiple remote lacunar infarcts, and poorly controlled diabetes and hyperlipidemia.  Resultant  Dysarthria and right face weakness  CT head: no acute stroke  MRI head: . 9 mm acute infarct in the posterior limb left internal capsule  MRA head: not performed  CTA head/neck: Severe R P1 and P2 segment stenosis  2D Echo  pending   LDL > 400  HgbA1c 12.3  SCDs for VTE prophylaxis Diet NPO time specified  aspirin 81 mg daily prior to admission, now on No  antithrombotic  Patient counseled to be compliant with his antithrombotic medications  Ongoing aggressive stroke risk factor management  Therapy recommendations: pending  Disposition:  pending  Hypertension  Unstable  Clevidipine gtt for initial control, discontinued last night  Permissive hypertension (OK if < 220/120) but gradually normalize in 5-7 days  Long-term BP goal normotensive  Hyperlipidemia  Home meds: atorvastatin 80 mg PO, resumed in hospital  Pt not taking statin  LDL > 400, goal < 70  Continue statin at discharge  Diabetes  HgbA1c 12.3, goal < 7.0  Uncontrolled  Home regimen of lantus 50 units QD, 20 units lantus QHS, and lispro 20 units TID with meals  Pt no taking full prescribed dose of home lantus  Diabetes Coordinator consulted  Other Stroke Risk Factors  Cigarette smoker, advised to stop smoking  Other Active Problems  ?UTI: rare bacteria in UA, no squamous cells  Hospital day # 1  I have personally examined this patient, reviewed notes, independently viewed imaging studies, participated in medical decision making and plan of care.ROS completed by me personally and pertinent positives fully documented  I have made any additions or clarifications directly to the above note.  He presented with slurred speech and facial droop secondary to small left subcortical infarct from small vessel disease likely related to uncontrolled multiple risk factors. Recommend Plavix for stroke prevention and consult to quit smoking. Strict control of hypertension diabetes and hyperlipidemia. Continue ongoing stroke workup. Strict control of blood pressure and close neurological monitoring as per post TPA protocol. This patient is critically ill and at significant risk of neurological worsening, death and care requires constant monitoring of vital signs, hemodynamics,respiratory and cardiac monitoring, extensive review of multiple databases, frequent neurological  assessment, discussion with family, other specialists and medical decision making of high complexity.I have made any additions or clarifications directly to the above note.This critical care time does not reflect procedure time, or teaching time or supervisory time of PA/NP/Med Resident etc but could involve care discussion time.  I spent 32 minutes of neurocritical care time  in the care of  this patient.      Delia Heady, MD Medical Director [facility] Pager: [phone] 12/26/2016 5:22 PM   To contact Stroke Continuity provider, please refer to [email or web address]. After hours, contact General Neurology

## 2016-12-26 NOTE — Evaluation (Signed)
Clinical/Bedside Swallow Evaluation Patient Details  Name: [name] [name] MRN: [id number] Date of Birth: 1970-12-17  Today's Date: 12/26/2016 Time: SLP Start Time (ACUTE ONLY): 1000 SLP Stop Time (ACUTE ONLY): 1208 SLP Time Calculation (min) (ACUTE ONLY): 128 min  Past Medical History:  Past Medical History:  Diagnosis Date  . Diabetes mellitus without complication (HCC)   . Hypertension   . Stroke [facility])    Past Surgical History:  Past Surgical History:  Procedure Laterality Date  . APPENDECTOMY    . BACK SURGERY     HPI:  BARTOLOMEO SACHAR a 46 y.o.malewho was last seen well 6:30 PM which time he began experiencing slurred speech, difficult walking, facial droop. He has a history of diabetes and hypertension and smokes 2-3 cigarettes per day. After arrival, he was taken for a stat head CT which was negative, was within the time window for TPA he received    Assessment / Plan / Recommendation Clinical Impression  Pt demosntrates concern for dysphagia secondary to neuromuscular imparment following CVA given moderate to severe dysarthria and immediate coughing with ice chip trials.  Pt able to iniaite realtively timely swallow of puree without signs of aspiration.  Will procced with objective testing to determine best diet.  SLP Visit Diagnosis: Dysphagia, oropharyngeal phase (R13.12)    Aspiration Risk  Severe aspiration risk    Diet Recommendation NPO        Other  Recommendations Oral Care Recommendations: Oral care BID   Follow up Recommendations        Frequency and Duration            Prognosis Prognosis for Safe Diet Advancement: Guarded      Swallow Study   General HPI: Kingslee Zales Harperis a 46 y.o.malewho was last seen well 6:30 PM which time he began experiencing slurred speech, difficult walking, facial droop. He has a history of diabetes and hypertension and smokes 2-3 cigarettes per day. After arrival, he was taken for a stat head CT which was negative,  was within the time window for TPA he received  Type of Study: Bedside Swallow Evaluation Previous Swallow Assessment: none Diet Prior to this Study: NPO Temperature Spikes Noted: No Respiratory Status: Room air History of Recent Intubation: No Behavior/Cognition: Alert;Cooperative Oral Cavity Assessment: Within Functional Limits Oral Care Completed by SLP: No Oral Cavity - Dentition: Adequate natural dentition Vision: Functional for self-feeding Self-Feeding Abilities: Able to feed self Patient Positioning: Upright in bed Baseline Vocal Quality: Suspected CN X (Vagus) involvement Volitional Cough: Weak Volitional Swallow: Able to elicit    Oral/Motor/Sensory Function Overall Oral Motor/Sensory Function: Moderate impairment Facial ROM: Reduced right;Suspected CN VII (facial) dysfunction Facial Symmetry: Abnormal symmetry right;Suspected CN VII (facial) dysfunction Facial Strength: Reduced right;Suspected CN VII (facial) dysfunction Lingual ROM: Suspected CN XII (hypoglossal) dysfunction (base of tongue) Lingual Symmetry: Within Functional Limits Lingual Strength: Reduced Velum: Impaired right;Suspected CN X (Vagus) dysfunction;Impaired left   Ice Chips Ice chips: Impaired Presentation: Cup Pharyngeal Phase Impairments: Cough - Immediate   Thin Liquid Thin Liquid: Not tested    Nectar Thick Nectar Thick Liquid: Not tested   Honey Thick Honey Thick Liquid: Not tested   Puree Puree: Within functional limits   Solid   GO   Solid: Not tested       Harlon Ditty, MA CCC-SLP [phone]  Claudine Mouton 12/26/2016,12:05 PM

## 2016-12-26 NOTE — Progress Notes (Signed)
OT Cancellation Note  Patient Details Name: [name] [name] MRN: [id number] DOB: 03-Dec-1970   Cancelled Treatment:    Reason Eval/Treat Not Completed: Patient not medically ready (strict bedrest) OT order received and appreciated however this conflicts with current bedrest order set. Please increase activity tolerance as appropriate and remove bedrest from orders. OT will hold evaluation at this time and will check back as time allows pending increased activity orders.   Harolyn Rutherford  [phone] 12/26/2016, 7:16 AM

## 2016-12-26 NOTE — Progress Notes (Signed)
Inpatient Diabetes Program Recommendations  AACE/ADA: New Consensus Statement on Inpatient Glycemic Control (2015)  Target Ranges:  Prepandial:   less than 140 mg/dL      Peak postprandial:   less than 180 mg/dL (1-2 hours)      Critically ill patients:  140 - 180 mg/dL   Spoke with patient about diabetes and home regimen for diabetes control. Patient has been living with his sister for about 9 months. Patient has dysarthria and slurred speech. Patient tearful during conversation and states " It doesn't look like a bright future for me." I encouraged patient and discussed his A1c level with him and with his sister who was also present. They live in between [place] and [facility]. Patient mentioned he had been working with his PCP for a long time to control his glucose mentioned to him about seeing an Endocrinologist. Sister mentioned to me that Mr. Cowell is usually home by himself and she is not 100% on him taking his medications as prescribed. Though she does mention seeing him administer insulin occasionally. Spoke with patient and sister about diet modifications and Glucose and A1c goals. Mentioned to patient about checking his glucose frequently every day to determine the need to call the MD for adjustments.  Explained how hyperglycemia leads to damage within blood vessels which lead to the common complications seen with uncontrolled diabetes. Stressed to the patient the importance of improving glycemic control to prevent further complications from uncontrolled diabetes. Discussed impact of nutrition, exercise, stress, sickness, and medications on diabetes control. Patient verbalized understanding of information discussed. Discussed with patient and sister to inform the RN's if they would like another visit while they are here before d/c to clarify information and/or questions not able to be answered by RNs..   Thanks,  Christena Deem RN, MSN, [facility] Inpatient Diabetes Coordinator Team Pager [phone]  (8a-5p)

## 2016-12-26 NOTE — Progress Notes (Signed)
PT Cancellation Note  Patient Details Name: [name] [name] MRN: [id number] DOB: Dec 07, 1970   Cancelled Treatment:    Reason Eval/Treat Not Completed: Medical issues which prohibited therapy; patient s/p TPA and currently on bedrest.  Will attempt to see when ready for OOB mobility.   Elray Mcgregor 12/26/2016, 8:24 AM  Sheran Lawless, PT [phone] 12/26/2016

## 2016-12-26 NOTE — Progress Notes (Signed)
SLP Cancellation Note  Patient Details Name: MADELYN LEET MRN: [id number] DOB: Dec 31, 1970   Cancelled treatment:       Reason Eval/Treat Not Completed: Medical issues which prohibited therapy. Pt currently NPO due to glucose stabilizer, but will need swallow eval orders when pt ready to initiate PO given struggle swallowing water after passing RNSS. Await BSE orders, discussed with RN. Will address cognitive linguistic eval at that time as well.    Nyxon Strupp, Riley Nearing 12/26/2016, 8:29 AM

## 2016-12-26 NOTE — Procedures (Signed)
Objective Swallowing Evaluation: Type of Study: FEES-Fiberoptic Endoscopic Evaluation of Swallow  Patient Details  Name: [name] [name] MRN: [id number] Date of Birth: [birth year]  Today's Date: 12/26/2016 Time: SLP Start Time (ACUTE ONLY): 1036-SLP Stop Time (ACUTE ONLY): 1115 SLP Time Calculation (min) (ACUTE ONLY): 39 min  Past Medical History:  Past Medical History:  Diagnosis Date  . Diabetes mellitus without complication (HCC)   . Hypertension   . Stroke [facility])    Past Surgical History:  Past Surgical History:  Procedure Laterality Date  . APPENDECTOMY    . BACK SURGERY     HPI: RUE TINNEL a 46 y.o.malewho was last seen well 6:30 PM which time he began experiencing slurred speech, difficult walking, facial droop. He has a history of diabetes and hypertension and smokes 2-3 cigarettes per day. 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 Pt observed to have decreased base of tongue retraction, velar elevation, pharyngeal constriction and decreased laryngeal sensation of aspiration. Visualization of airway was difficult to achieve conssitently during FEES, making assessment of moment of aspiration (before/during/after) impossible. Pt was definitley seen to have standing aspiration of nectar thick liquids below vocal cords without sensatin, suspected to have occurred during the swallow. Honey thick liquids, even after consecutive swallows were tolerated well without the moderate residuals seen with puree that pt could not clear. Recommend pt initiate honey thick liquids only with f/u MBS in 2-3 days for diet upgrade. Instructed pt to complete Shaker/CTAR with demonstration and teach back for base of tongue/pharyngeal  strengthening.   SLP Visit Diagnosis Dysphagia, oropharyngeal phase (R13.12)  Attention and concentration deficit following --  Frontal lobe and executive function deficit following --  Impact on safety and function Moderate aspiration risk      CHL IP TREATMENT RECOMMENDATION 12/26/2016  Treatment Recommendations F/U MBS in --- days (Comment)     Prognosis 12/26/2016  Prognosis for Safe Diet Advancement Good  Barriers to Reach Goals --  Barriers/Prognosis Comment --    CHL IP DIET RECOMMENDATION 12/26/2016  SLP Diet Recommendations Honey thick liquids  Liquid Administration via --  Medication Administration Crushed with puree  Compensations Slow rate;Small sips/bites;Multiple dry swallows after each bite/sip  Postural Changes --      CHL IP OTHER RECOMMENDATIONS 12/26/2016  Recommended Consults --  Oral Care Recommendations Oral care BID  Other Recommendations --      CHL IP FOLLOW UP RECOMMENDATIONS 12/26/2016  Follow up Recommendations Inpatient Rehab      CHL IP FREQUENCY AND DURATION 12/26/2016  Speech Therapy Frequency (ACUTE ONLY) min 2x/week  Treatment Duration 2 weeks           CHL IP ORAL PHASE 12/26/2016  Oral Phase Impaired  Oral - Pudding Teaspoon --  Oral - Pudding Cup --  Oral - Honey Teaspoon --  Oral - Honey Cup Delayed oral transit  Oral - Nectar Teaspoon --  Oral - Nectar Cup Delayed oral transit  Oral - Nectar Straw --  Oral - Thin Teaspoon --  Oral - Thin Cup --  Oral - Thin Straw --  Oral - Puree Delayed oral transit  Oral - Mech Soft --  Oral - Regular --  Oral - Multi-Consistency --  Oral - Pill --  Oral Phase - Comment --    CHL IP PHARYNGEAL PHASE 12/26/2016  Pharyngeal Phase Impaired  Pharyngeal- Pudding Teaspoon --  Pharyngeal --  Pharyngeal- Pudding Cup --  Pharyngeal --  Pharyngeal- Honey Teaspoon --  Pharyngeal --  Pharyngeal- Honey Cup Delayed swallow initiation-pyriform sinuses;Reduced tongue base retraction;Reduced pharyngeal  peristalsis;Pharyngeal residue - valleculae;Pharyngeal residue - pyriform;Pharyngeal residue - posterior pharnyx;Lateral channel residue  Pharyngeal --  Pharyngeal- Nectar Teaspoon --  Pharyngeal --  Pharyngeal- Nectar Cup Reduced tongue base retraction;Reduced airway/laryngeal closure;Pharyngeal residue - valleculae;Pharyngeal residue - pyriform;Lateral channel residue;Penetration/Aspiration during swallow;Compensatory strategies attempted (with notebox)  Pharyngeal --  Pharyngeal- Nectar Straw --  Pharyngeal --  Pharyngeal- Thin Teaspoon --  Pharyngeal --  Pharyngeal- Thin Cup --  Pharyngeal --  Pharyngeal- Thin Straw --  Pharyngeal --  Pharyngeal- Puree Pharyngeal residue - valleculae;Pharyngeal residue - pyriform  Pharyngeal --  Pharyngeal- Mechanical Soft --  Pharyngeal --  Pharyngeal- Regular --  Pharyngeal --  Pharyngeal- Multi-consistency --  Pharyngeal --  Pharyngeal- Pill --  Pharyngeal --  Pharyngeal Comment --     No flowsheet data found.  No flowsheet data found. Harlon Ditty, [place] CCC-SLP [phone]  [name] [name] 12/26/2016, 12:31 PM

## 2016-12-27 ENCOUNTER — Other Ambulatory Visit (HOSPITAL_COMMUNITY): Payer: Non-veteran care

## 2016-12-27 LAB — GLUCOSE, CAPILLARY
GLUCOSE-CAPILLARY: 247 mg/dL — AB (ref 65–99)
GLUCOSE-CAPILLARY: 258 mg/dL — AB (ref 65–99)
GLUCOSE-CAPILLARY: 262 mg/dL — AB (ref 65–99)
GLUCOSE-CAPILLARY: 199 mg/dL — AB (ref 65–99)
Glucose-Capillary: 251 mg/dL — ABNORMAL HIGH (ref 65–99)
Glucose-Capillary: 267 mg/dL — ABNORMAL HIGH (ref 65–99)

## 2016-12-27 LAB — CBC
HEMATOCRIT: 41.5 % (ref 39.0–52.0)
HEMOGLOBIN: 14.7 g/dL (ref 13.0–17.0)
MCH: 29.6 pg (ref 26.0–34.0)
MCHC: 35.4 g/dL (ref 30.0–36.0)
MCV: 83.5 fL (ref 78.0–100.0)
Platelets: 271 10*3/uL (ref 150–400)
RBC: 4.97 MIL/uL (ref 4.22–5.81)
RDW: 12.6 % (ref 11.5–15.5)
WBC: 10.7 10*3/uL — AB (ref 4.0–10.5)

## 2016-12-27 MED ORDER — ALPRAZOLAM 0.5 MG PO TABS
0.5000 mg | ORAL_TABLET | Freq: Once | ORAL | Status: AC
  Administered 2016-12-27: 0.5 mg via ORAL
  Filled 2016-12-27: qty 1

## 2016-12-27 MED ORDER — SERTRALINE HCL 100 MG PO TABS
100.0000 mg | ORAL_TABLET | Freq: Every day | ORAL | Status: DC
  Administered 2016-12-27 – 2017-01-02 (×7): 100 mg via ORAL
  Filled 2016-12-27: qty 1
  Filled 2016-12-27: qty 2
  Filled 2016-12-27 (×5): qty 1

## 2016-12-27 MED ORDER — ATORVASTATIN CALCIUM 80 MG PO TABS
80.0000 mg | ORAL_TABLET | Freq: Every day | ORAL | Status: DC
  Administered 2016-12-27 – 2017-01-02 (×7): 80 mg via ORAL
  Filled 2016-12-27 (×7): qty 1

## 2016-12-27 MED ORDER — ATENOLOL 25 MG PO TABS
50.0000 mg | ORAL_TABLET | Freq: Every day | ORAL | Status: DC
  Administered 2016-12-27 – 2017-01-01 (×6): 50 mg via ORAL
  Filled 2016-12-27 (×6): qty 2

## 2016-12-27 MED ORDER — CITALOPRAM HYDROBROMIDE 40 MG PO TABS
40.0000 mg | ORAL_TABLET | Freq: Every day | ORAL | Status: DC
Start: 2016-12-27 — End: 2017-01-02
  Administered 2016-12-27 – 2017-01-02 (×7): 40 mg via ORAL
  Filled 2016-12-27: qty 1
  Filled 2016-12-27: qty 4
  Filled 2016-12-27 (×5): qty 1

## 2016-12-27 MED ORDER — AMLODIPINE BESYLATE 10 MG PO TABS
10.0000 mg | ORAL_TABLET | Freq: Every day | ORAL | Status: DC
  Administered 2016-12-27 – 2017-01-01 (×6): 10 mg via ORAL
  Filled 2016-12-27 (×6): qty 1

## 2016-12-27 MED ORDER — LISINOPRIL 20 MG PO TABS
40.0000 mg | ORAL_TABLET | Freq: Every day | ORAL | Status: DC
  Administered 2016-12-27 – 2017-01-02 (×7): 40 mg via ORAL
  Filled 2016-12-27 (×7): qty 2

## 2016-12-27 MED ORDER — CHLORTHALIDONE 25 MG PO TABS
50.0000 mg | ORAL_TABLET | Freq: Every day | ORAL | Status: DC
Start: 2016-12-27 — End: 2017-01-02
  Administered 2016-12-27 – 2017-01-02 (×7): 50 mg via ORAL
  Filled 2016-12-27 (×3): qty 2
  Filled 2016-12-27: qty 1
  Filled 2016-12-27 (×3): qty 2

## 2016-12-27 MED ORDER — FOLIC ACID 1 MG PO TABS
1.0000 mg | ORAL_TABLET | Freq: Every day | ORAL | Status: DC
Start: 2016-12-27 — End: 2017-01-02
  Administered 2016-12-27 – 2017-01-02 (×7): 1 mg via ORAL
  Filled 2016-12-27 (×7): qty 1

## 2016-12-27 MED ORDER — ASPIRIN 325 MG PO TABS
325.0000 mg | ORAL_TABLET | Freq: Every day | ORAL | Status: DC
  Administered 2016-12-27 – 2016-12-29 (×3): 325 mg via ORAL
  Filled 2016-12-27 (×3): qty 1

## 2016-12-27 MED ORDER — ASPIRIN 300 MG RE SUPP
300.0000 mg | Freq: Every day | RECTAL | Status: DC
  Administered 2016-12-27: 300 mg via RECTAL
  Filled 2016-12-27: qty 1

## 2016-12-27 NOTE — Progress Notes (Signed)
  Speech Language Pathology Treatment: Dysphagia  Patient Details Name: [name] [name] MRN: [id number] DOB: 12/09/1970 Today's Date: 12/27/2016 Time: 3790-2409 SLP Time Calculation (min) (ACUTE ONLY): 23 min  Assessment / Plan / Recommendation Clinical Impression  SLP provided f/u for diet modification and precautions. Pt observed to tolerate honey thick liquids well but was noted to have increased sensation of residuals with pudding textures as noted on FEES. Encouraged requests for assist with meal set up, and self feeding with adaptations as needed. Guided pt in 20 repetitions of Shaker exercise with success, pt reported 5/10 effort. Will plan to complete MBS on or before Friday this week. Recommend CIR at d/c.   HPI HPI: [name] [name] a 46 y.o.malewho was last seen well 6:30 PM which time he began experiencing slurred speech, difficult walking, facial droop. He has a history of diabetes and hypertension and smokes 2-3 cigarettes per day. After arrival, he was taken for a stat head CT which was negative, was within the time window for TPA he received       SLP Plan  Continue with current plan of care  Patient needs continued Speech Lanaguage Pathology Services    Recommendations  Diet recommendations: Honey-thick liquid Liquids provided via: Cup Medication Administration: Whole meds with puree Supervision: Staff to assist with self feeding (needs set up assist) Compensations: Slow rate;Small sips/bites;Multiple dry swallows after each bite/sip                General recommendations: Rehab consult Oral Care Recommendations: Oral care BID Follow up Recommendations: Inpatient Rehab SLP Visit Diagnosis: Dysphagia, oropharyngeal phase (R13.12) Plan: Continue with current plan of care       GO               [facility], MA CCC-SLP [phone]  Claudine Mouton 12/27/2016, 2:41 PM

## 2016-12-27 NOTE — Progress Notes (Signed)
OT Cancellation Note  Patient Details Name: [name] [name] MRN: [id number] DOB: May 19, 1970   Cancelled Treatment:    Reason Eval/Treat Not Completed: Patient not medically ready  Harolyn Rutherford  [phone] 12/27/2016, 7:28 AM

## 2016-12-27 NOTE — Progress Notes (Signed)
PT Cancellation Note  Patient Details Name: KEO SEBASTIANI MRN: [id number] DOB: 02-21-1971   Cancelled Treatment:    Reason Eval/Treat Not Completed: Medical issues which prohibited therapy. Pt currently on bedrest. Will await increased activity orders prior to initiating PT eval.    Marylynn Pearson 12/27/2016, 7:55 AM  Conni Slipper, PT, DPT Acute Rehabilitation Services Pager: [phone]

## 2016-12-27 NOTE — Progress Notes (Signed)
STROKE TEAM PROGRESS NOTE   HISTORY OF PRESENT ILLNESS (per record) [name] [name] is a 46 y.o. male with a history of DM2, HTN, and current tobacco abuse (2-3 cigarettes per day), LSN 6:30 PM on 12/25/2016, who presented with slurred speech, difficulty walking, L-sided weakness, and L-sided facial droop.  After arrival, he was taken for a stat head CT, which was negative.  He was within the time window for tPA, which, after some difficulty controlling his blood pressure, was administered at 2027 on 12/25/2016.  MRI brain with 9 mm acute infarct in the posterior limb left internal capsule.  LKW: 6:30 PM on 12/25/2016 Modified Rankin Score: 1  Patient was administered [place] t-[place] at [address]217 on 12/25/2016. He was admitted to the neuro ICU for further evaluation and treatment.   SUBJECTIVE (INTERVAL HISTORY) His sister is at the bedside.  Pt is improving but remains dysarthric.  Pt has sinus tachycardia to 120s on the monitor.   I have reviewed the patient's medical records in care everywhere. He was admitted to [facility] in [place] for a stroke a few years ago. He did have cardiac tachycardia without explanation hence underwent loop recorder placed by cardiologist there. It is unclear as to who is following the loop recorder as he has moved to [place] area and is followed at the [facility] clinic    OBJECTIVE Temp:  [98.1 F (36.7 C)-98.7 F (37.1 C)] 98.4 F (36.9 C) (09/05 1519) Pulse Rate:  [83-126] 89 (09/05 1519) Cardiac Rhythm: Normal sinus rhythm;Sinus tachycardia (09/05 0800) Resp:  [0-21] 17 (09/05 1519) BP: (135-179)/(80-163) 158/109 (09/05 1519) SpO2:  [92 %-100 %] 97 % (09/05 1519)  CBC:   Recent Labs Lab 12/25/16 1902 12/25/16 1922 12/27/16 0236  WBC 9.4  --  10.7*  NEUTROABS 6.2  --   --   HGB 15.6 15.6 14.7  HCT 42.7 46.0 41.5  MCV 81.8  --  83.5  PLT 249  --  271    Basic Metabolic Panel:   Recent Labs Lab 12/25/16 1902 12/25/16 1922  12/26/16 0226  NA 135 134* 136  K 3.2* 4.3 3.5  CL 97* 97* 100*  CO2 25  --  23  GLUCOSE 398* 403* 363*  BUN 9 12 10   CREATININE 1.02 0.80 1.02  CALCIUM 9.9  --  9.9    Lipid Panel:     Component Value Date/Time   CHOL 222 (H) 12/26/2016 0226   TRIG 439 (H) 12/26/2016 0226   HDL 39 (L) 12/26/2016 0226   CHOLHDL 5.7 12/26/2016 0226   VLDL UNABLE TO CALCULATE IF TRIGLYCERIDE OVER 400 mg/dL 16/01/9603 5409   LDLCALC UNABLE TO CALCULATE IF TRIGLYCERIDE OVER 400 mg/dL 81/19/1478 2956   OZHY8M:  Lab Results  Component Value Date   HGBA1C 12.3 (H) 12/26/2016   Urine Drug Screen:     Component Value Date/Time   LABOPIA NONE DETECTED 12/25/2016 1949   COCAINSCRNUR NONE DETECTED 12/25/2016 1949   COCAINSCRNUR NEGATIVE 03/09/2013 1841   LABBENZ NONE DETECTED 12/25/2016 1949   AMPHETMU NONE DETECTED 12/25/2016 1949   THCU NONE DETECTED 12/25/2016 1949   LABBARB NONE DETECTED 12/25/2016 1949    Alcohol Level     Component Value Date/Time   ETH <5 12/25/2016 1902    IMAGING  Ct Angio Head W Or Wo Contrast Ct Angio Neck W And/or Wo Contrast 12/25/2016 IMPRESSION: 1. Negative for emergent large vessel occlusion but positive for severe stenosis of the right PCA P1 and  P2 segments. 2. No arterial occlusion identified. Mild or up to moderate irregularity of other circle of Willis branches, including the left PCA and left MCA. 3. No Basilar Artery or extracranial atherosclerosis or stenosis. 4. No acute findings in the neck.  Prior ACDF.   Ct Head Wo Contrast 12/25/2016 IMPRESSION: 1. No acute intracranial abnormality identified. 2. Stable advanced chronic microvascular ischemic changes of the brain for age with multiple chronic lacunar infarcts in basal ganglia and the pons.   Ct Head Code Stroke Wo Contrast 12/25/2016 IMPRESSION: 1. No acute cortically based infarct or acute intracranial hemorrhage identified. 2. ASPECTS is 10. 3. Markedly advanced cerebral volume loss and new  evidence of bilateral small vessel ischemia since a prior head CT in 2012. 4.  MRI Brain Wo Contrast 12/26/2016 IMPRESSION: 1. 9 mm acute infarct in the posterior limb left internal capsule. 2. Chronic small vessel ischemia with multiple remote lacunar infarcts, extensive for age.  TTE 12/26/2016  Left ventricle: The cavity size was normal. There was mild   concentric hypertrophy. Systolic function was normal. The   estimated ejection fraction was in the range of 50% to 55%. Wall   motion was normal; there were no regional wall motion   abnormalities     PHYSICAL EXAM Pleasant middle-age Caucasian male currently not in distress. . Afebrile. Head is nontraumatic. Neck is supple without bruit.    Cardiac exam no murmur or gallop. Lungs are clear to auscultation. Distal pulses are well felt. Blood pressure (!) 149/99, pulse 94, temperature 98.1 F (36.7 C), temperature source Axillary, resp. rate 16, height 5\' 9"  (1.753 m), weight 182 lb 5.1 oz (82.7 kg), SpO2 97 %. Neurological exam ;  Awake alert oriented 3. Severe dysarthria but can be understood. No aphasia. Extraocular moments are full range but he has saccadic dysmetria in either direction. Mild right lower facial weakness. Tongue midline. Weak cough and gag. Motor system exam no upper or lower extremity drift but mild weakness of right grip and intrinsic hand muscles. Fine finger movements are diminished on the right. Orbits left over right apex daily. Symmetric lower extremity strength. Deep tendon reflexes are symmetric. Sensation intact. Plantars downgoing. Gait not tested. ASSESSMENT/PLAN [name] is a 46 y.o. male with history of DM2, HTN, and current tobacco abuse (2-3 cigarettes per day), LSN 6:30 PM on 12/25/2016, who presented with slurred speech, difficulty walking, L-sided weakness, and L-sided facial droop. He received IV t-PA at 2027 on 12/25/2016.   Stroke: 9 mm acute infarct in the posterior limb left internal capsule  in the setting of advanced small vessel disease, multiple remote lacunar infarcts, and poorly controlled diabetes and hyperlipidemia.  Resultant  Dysarthria and right face weakness  CT head: no acute stroke  MRI head: . 9 mm acute infarct in the posterior limb left internal capsule  MRA head: not performed  CTA head/neck: Severe R P1 and P2 segment stenosis 2D Echo  Left ventricle: The cavity size was normal. There was mild   concentric hypertrophy. Systolic function was normal. The   estimated ejection fraction was in the range of 50% to 55%. Wall   motion was normal; there were no regional wall motion    abnormalities   LDL > 400  HgbA1c 12.3  SCDs for VTE prophylaxis Diet full liquid Room service appropriate? Yes; Fluid consistency: Honey Thick  aspirin 81 mg daily prior to admission, now on No antithrombotic  Patient counseled to be compliant with his antithrombotic medications  Ongoing aggressive stroke risk factor management  Therapy recommendations: pending  Disposition:  pending  Hypertension  Unstable  Clevidipine gtt for initial control, discontinued last night  Permissive hypertension (OK if < 220/120) but gradually normalize in 5-7 days  Long-term BP goal normotensive  Hyperlipidemia  Home meds: atorvastatin 80 mg PO, resumed in hospital  Pt not taking statin  LDL > 400, goal < 70  Continue statin at discharge  Diabetes  HgbA1c 12.3, goal < 7.0  Uncontrolled  Home regimen of lantus 50 units QD, 20 units lantus QHS, and lispro 20 units TID with meals  Pt no taking full prescribed dose of home lantus  Diabetes Coordinator consulted  Other Stroke Risk Factors  Cigarette smoker, advised to stop smoking  Other Active Problems  ?UTI: rare bacteria in UA, no squamous cells  Hospital day # 2  I have personally examined this patient, reviewed notes, independently viewed imaging studies, participated in medical decision making and plan of  care.ROS completed by me personally and pertinent positives fully documented  I have made any additions or clarifications directly to the above note.  He presented with slurred speech and facial droop secondary to small left subcortical infarct from small vessel disease likely related to uncontrolled multiple risk factors. Recommend Plavix for stroke prevention and consult to quit smoking. Strict control of hypertension diabetes and hyperlipidemia. Recommend mobilize out of bed with physical occupational therapy and rehabilitation consults. Transfer to floor bed. Loop recorder interrogation by cardiology requested. Long discussion with patient and sister and answered questions. Greater than 50% time during this 35 minute visit was spent on counseling and coordination of care about his stroke and loop recorder on answering questions      Delia Heady, MD Medical Director [facility] Pager: [phone] 12/27/2016 4:25 PM   To contact Stroke Continuity provider, please refer to [email or web address]. After hours, contact General Neurology

## 2016-12-27 NOTE — Progress Notes (Signed)
Inpatient Diabetes Program Recommendations  AACE/ADA: New Consensus Statement on Inpatient Glycemic Control (2015)  Target Ranges:  Prepandial:   less than 140 mg/dL      Peak postprandial:   less than 180 mg/dL (1-2 hours)      Critically ill patients:  140 - 180 mg/dL   Results for DORMAN, CLINKSCALES (MRN [id number]) as of 12/27/2016 10:16  Ref. Range 12/26/2016 12:55 12/26/2016 15:05 12/26/2016 15:30 12/26/2016 19:18 12/26/2016 23:12 12/27/2016 03:23 12/27/2016 07:42  Glucose-Capillary Latest Ref Range: 65 - 99 mg/dL 505 (H) 183 (H) 358 (H) 255 (H) 229 (H) 247 (H) 267 (H)   Review of Glycemic Control  Diabetes history: DM 2 Outpatient Diabetes medications: Lantus 20 units QHS, Humalog 20 units tid with meals Current orders for Inpatient glycemic control: Lantus 20 units, Novolog Moderate Correction 0-15 units Q4 hours  Inpatient Diabetes Program Recommendations:    Glucose in 200's. Consider increasing Lantus to 26 units Daily.   Thanks, Christena Deem RN, MSN, [facility] Inpatient Diabetes Coordinator Team Pager [phone] (8a-5p)

## 2016-12-27 NOTE — Evaluation (Signed)
Speech Language Pathology Evaluation Patient Details Name: [name] [name] MRN: [id number] DOB: 03-07-1971 Today's Date: 12/27/2016 Time: 7408-1448 SLP Time Calculation (min) (ACUTE ONLY): 23 min  Problem List:  Patient Active Problem List   Diagnosis Date Noted  . Stroke (HCC) 12/25/2016  . Stroke (cerebrum) (HCC) 12/25/2016   Past Medical History:  Past Medical History:  Diagnosis Date  . Diabetes mellitus without complication (HCC)   . Hypertension   . Stroke [facility])    Past Surgical History:  Past Surgical History:  Procedure Laterality Date  . APPENDECTOMY    . BACK SURGERY     HPI:  [name] [name] a 46 y.o.malewho was last seen well 6:30 PM which time he began experiencing slurred speech, difficult walking, facial droop. He has a history of diabetes and hypertension and smokes 2-3 cigarettes per day. After arrival, he was taken for a stat head CT which was negative, was within the time window for TPA he received    Assessment / Plan / Recommendation Clinical Impression  Pt demonstrates adequate cognition and language function though speech iintelligiliby is impaired due to a moderate to severe dysarthria. Hypernasal resonance and imprecise lingual and labial articualtion noted. Pt is able to improve phrase and conversation from 20% intelligibility to 100% intelligbility after SLP modeled increased volume with a breath between words and overarticulation. Pt successful during the rest of session. Recommend ongoing compensatory strategies.     SLP Assessment  SLP Recommendation/Assessment: Patient needs continued Speech Lanaguage Pathology Services SLP Visit Diagnosis: Dysarthria and anarthria (R47.1)    Follow Up Recommendations  Inpatient Rehab    Frequency and Duration min 2x/week  2 weeks      SLP Evaluation Cognition  Overall Cognitive Status: Within Functional Limits for tasks assessed Orientation Level: Oriented X4       Comprehension  Auditory  Comprehension Overall Auditory Comprehension: Appears within functional limits for tasks assessed    Expression Verbal Expression Overall Verbal Expression: Appears within functional limits for tasks assessed   Oral / Motor  Oral Motor/Sensory Function Overall Oral Motor/Sensory Function: Moderate impairment Facial ROM: Reduced right;Suspected CN VII (facial) dysfunction Facial Symmetry: Abnormal symmetry right;Suspected CN VII (facial) dysfunction Facial Strength: Reduced right;Suspected CN VII (facial) dysfunction Facial Sensation: Reduced right Lingual ROM: Suspected CN XII (hypoglossal) dysfunction Lingual Symmetry: Within Functional Limits Lingual Strength: Reduced Lingual Sensation: Within Functional Limits Velum: Impaired right;Suspected CN X (Vagus) dysfunction;Impaired left Mandible: Within Functional Limits Motor Speech Overall Motor Speech: Impaired Respiration: Within functional limits Phonation: Normal Resonance: Hypernasality Articulation: Impaired Level of Impairment: Word Intelligibility: Intelligibility reduced Word: 25-49% accurate Phrase: 25-49% accurate Sentence: 25-49% accurate Conversation: 25-49% accurate Motor Planning: Witnin functional limits Motor Speech Errors: Aware;Consistent Effective Techniques: Increased vocal intensity;Over-articulate;Pacing   GO                   Harlon Ditty, MA CCC-SLP [phone]  Claudine Mouton 12/27/2016, 2:33 PM

## 2016-12-28 ENCOUNTER — Inpatient Hospital Stay (HOSPITAL_COMMUNITY): Payer: Non-veteran care

## 2016-12-28 DIAGNOSIS — I639 Cerebral infarction, unspecified: Principal | ICD-10-CM

## 2016-12-28 DIAGNOSIS — G8191 Hemiplegia, unspecified affecting right dominant side: Secondary | ICD-10-CM

## 2016-12-28 DIAGNOSIS — R002 Palpitations: Secondary | ICD-10-CM | POA: Diagnosis not present

## 2016-12-28 DIAGNOSIS — I6300 Cerebral infarction due to thrombosis of unspecified precerebral artery: Secondary | ICD-10-CM | POA: Diagnosis not present

## 2016-12-28 LAB — GLUCOSE, CAPILLARY
GLUCOSE-CAPILLARY: 164 mg/dL — AB (ref 65–99)
GLUCOSE-CAPILLARY: 268 mg/dL — AB (ref 65–99)
GLUCOSE-CAPILLARY: 281 mg/dL — AB (ref 65–99)
Glucose-Capillary: 206 mg/dL — ABNORMAL HIGH (ref 65–99)
Glucose-Capillary: 224 mg/dL — ABNORMAL HIGH (ref 65–99)
Glucose-Capillary: 295 mg/dL — ABNORMAL HIGH (ref 65–99)

## 2016-12-28 MED ORDER — PANTOPRAZOLE SODIUM 40 MG PO TBEC
40.0000 mg | DELAYED_RELEASE_TABLET | Freq: Every day | ORAL | Status: DC
  Administered 2016-12-28 – 2017-01-01 (×5): 40 mg via ORAL
  Filled 2016-12-28 (×5): qty 1

## 2016-12-28 NOTE — Consult Note (Signed)
Physical Medicine and Rehabilitation Consult   Reason for Consult: Stroke with dysphagia Referring Physician: Dr. Pearlean Brownie   HPI: [name] [name] is a 46 y.o. male with history of CVA (s/p loop recorder) HTN, T2DM, who was admitted on 12/25/16 with left facial droop with speech difficulty, left sided weakness and difficulty walking. UDS negative.  He was administered tPA as CT head negative and MRI brain done revealing acute infarct in left posterior limb internal capsule.  CTA head/neck with severe stenosis of right PCA P1 and P2 segments2D echo with EF 50-55% with grade 1 diastolic dysfunction and no wall abnormality.  Swallow evaluation with moderate dysphagia and he was placed on honey thick liquids.  Dr. Pearlean Brownie recommended Plavix and strict control on risk factors for stroke felt to be due to small vessel disease.  PT/OT evaluations pending. MD recommending CIR for follow up therapy.    Review of Systems  Constitutional: Negative for fever.  HENT: Negative for hearing loss.   Eyes: Negative for blurred vision.  Respiratory: Negative for cough.   Cardiovascular: Negative for chest pain.  Gastrointestinal: Negative for nausea.  Genitourinary: Negative for dysuria.  Musculoskeletal: Negative for myalgias.  Skin: Negative for rash.  Neurological: Positive for speech change and focal weakness.  Psychiatric/Behavioral: Negative for hallucinations.      Past Medical History:  Diagnosis Date  . Diabetes mellitus without complication (HCC)   . Hypertension   . Stroke [facility])     Past Surgical History:  Procedure Laterality Date  . APPENDECTOMY    . BACK SURGERY      History reviewed. No pertinent family history.    Social History:  reports that he has been smoking.  He has never used smokeless tobacco. He reports that he does not drink alcohol or use drugs.    Allergies  Allergen Reactions  . Penicillins Other (See Comments)    Has patient had a PCN reaction causing  immediate rash, facial/tongue/throat swelling, SOB or lightheadedness with hypotension: Yes Has patient had a PCN reaction causing severe rash involving mucus membranes or skin necrosis: No Has patient had a PCN reaction that required hospitalization: No Has patient had a PCN reaction occurring within the last 10 years: No If all of the above answers are "NO", then may proceed with Cephalosporin use.     Medications Prior to Admission  Medication Sig Dispense Refill  . amLODipine (NORVASC) 10 MG tablet Take 10 mg by mouth at bedtime.     Marland Kitchen aspirin EC 81 MG tablet Take 81 mg by mouth daily.    Marland Kitchen atenolol (TENORMIN) 50 MG tablet Take 50 mg by mouth at bedtime.     Marland Kitchen atorvastatin (LIPITOR) 80 MG tablet Take 80 mg by mouth daily.    . citalopram (CELEXA) 40 MG tablet Take 40 mg by mouth daily.    . folic acid (FOLVITE) 1 MG tablet Take 1 mg by mouth daily.    . insulin glargine (LANTUS) 100 UNIT/ML injection Inject 20 Units into the skin at bedtime.    . insulin lispro (HUMALOG) 100 UNIT/ML injection Inject 20 Units into the skin 3 (three) times daily.    Marland Kitchen lisinopril (PRINIVIL,ZESTRIL) 40 MG tablet Take 40 mg by mouth daily.    . ondansetron (ZOFRAN) 4 MG tablet Take 1 tablet (4 mg total) by mouth every 8 (eight) hours as needed for nausea or vomiting. 8 tablet 0  . sertraline (ZOLOFT) 100 MG tablet Take 100 mg by  mouth daily.    . chlorthalidone (HYGROTON) 50 MG tablet Take 50 mg by mouth daily.      Home: Home Living Family/patient expects to be discharged to:: Inpatient rehab Living Arrangements: Other relatives  Functional History:   Functional Status:  Mobility:          ADL:    Cognition: Cognition Overall Cognitive Status: Within Functional Limits for tasks assessed Orientation Level: Oriented X4 Cognition Overall Cognitive Status: Within Functional Limits for tasks assessed  Blood pressure (!) 166/95, pulse 80, temperature 99 F (37.2 C), temperature source Oral,  resp. rate 20, height 5\' 9"  (1.753 m), weight 82.7 kg (182 lb 5.1 oz), SpO2 95 %. Physical Exam  Constitutional: He appears well-developed.  HENT:  Head: Normocephalic.  Eyes: Pupils are equal, round, and reactive to light.  Cardiovascular: Normal rate.   Respiratory: Effort normal.  GI: He exhibits no distension. There is no tenderness.  Musculoskeletal: He exhibits no edema.  Neurological:  Pt is fairly alert. Very dysarthric. RUE 0/5 prox to distal. RLE 0/5. Pt with flexor tone 2/4 RUE and extensor tone 1-2/4 LLE. DTR's 3+ on right side. Senses pain equally on all 4. Poor sitting balance with lean to right, pushes.   Psychiatric:  flat    Results for orders placed or performed during the hospital encounter of 12/25/16 (from the past 24 hour(s))  Glucose, capillary     Status: Abnormal   Collection Time: 12/27/16 11:32 AM  Result Value Ref Range   Glucose-Capillary 258 (H) 65 - 99 mg/dL  Glucose, capillary     Status: Abnormal   Collection Time: 12/27/16  5:29 PM  Result Value Ref Range   Glucose-Capillary 262 (H) 65 - 99 mg/dL  Glucose, capillary     Status: Abnormal   Collection Time: 12/27/16  8:02 PM  Result Value Ref Range   Glucose-Capillary 199 (H) 65 - 99 mg/dL   Comment 1 Notify RN    Comment 2 Document in Chart   Glucose, capillary     Status: Abnormal   Collection Time: 12/27/16 11:47 PM  Result Value Ref Range   Glucose-Capillary 251 (H) 65 - 99 mg/dL   Comment 1 Notify RN    Comment 2 Document in Chart   Glucose, capillary     Status: Abnormal   Collection Time: 12/28/16  3:57 AM  Result Value Ref Range   Glucose-Capillary 206 (H) 65 - 99 mg/dL   Comment 1 Notify RN    Comment 2 Document in Chart    Ct Head Wo Contrast  Result Date: 12/26/2016 CLINICAL DATA:  Follow-up examination status post tPA. EXAM: CT HEAD WITHOUT CONTRAST TECHNIQUE: Contiguous axial images were obtained from the base of the skull through the vertex without intravenous contrast.  COMPARISON:  Prior CT from 12/25/2016. FINDINGS: Brain: Cerebral atrophy with chronic microvascular ischemic disease, stable. Evolving 9 mm acute lacunar type infarction at the posterior limb of the left internal capsule, stable from recent MRI. No acute intracranial hemorrhage status post tPA. No new acute large vessel territory infarct. No mass lesion, midline shift or mass effect. No hydrocephalus. No extra-axial fluid collection. Vascular: No asymmetric hyperdense vessel. Intracranial atherosclerosis again noted. Skull: Scalp soft tissues and calvarium within normal limits. Sinuses/Orbits: Globes and orbital soft tissues within normal limits. Scattered mucosal thickening within the ethmoidal air cells. Paranasal sinuses are otherwise clear. No mastoid effusion. Other: None. IMPRESSION: 1. No acute intracranial hemorrhage status post tPA. 2. Evolving 9 mm acute ischemic  infarct at the posterior limb of the left internal capsule, stable from recent MRI. 3. No other new acute intracranial process. Electronically Signed   By: Rise Mu M.D.   On: 12/26/2016 21:26   Mr Brain Wo Contrast  Result Date: 12/26/2016 CLINICAL DATA:  Slurred speech with facial droop. Status post tPA. History of diabetes and hypertension. EXAM: MRI HEAD WITHOUT CONTRAST TECHNIQUE: Multiplanar, multiecho pulse sequences of the brain and surrounding structures were obtained without intravenous contrast. COMPARISON:  Head CT and CTA from yesterday FINDINGS: Brain: 9 mm ovoid acute infarct in the posterior limb left internal capsule. There is advanced chronic ischemic injury, especially for age, with nearly confluent the periventricular gliotic signal and chronic lacunes in the pons, bilateral thalamus, and bilateral deep white matter tracts. T2 hyperintensity in the bilateral middle cerebellar peduncle is symmetric and likely wallerian changes. Overall pattern history not typical for demyelination. The pons and cerebellum is  atrophic. Small remote micro hemorrhages seen in the brainstem along the left cerebellum. No acute hemorrhage, hydrocephalus, or masslike findings. Vascular: Recent CTA.  Major flow voids are preserved. Skull and upper cervical spine: Negative for marrow lesion Sinuses/Orbits: Negative IMPRESSION: 1. 9 mm acute infarct in the posterior limb left internal capsule. 2. Chronic small vessel ischemia with multiple remote lacunar infarcts, extensive for age. Electronically Signed   By: Marnee Spring M.D.   On: 12/26/2016 14:41    Assessment/Plan: Diagnosis: Left PLIC infarct with dense right hemiparesis 1. Does the need for close, 24 hr/day medical supervision in concert with the patient's rehab needs make it unreasonable for this patient to be served in a less intensive setting? Yes 2. Co-Morbidities requiring supervision/potential complications: HTN, post-stroke sequelae 3. Due to bladder management, bowel management, safety, skin/wound care, disease management, medication administration, pain management and patient education, does the patient require 24 hr/day rehab nursing? Yes 4. Does the patient require coordinated care of a physician, rehab nurse, PT (1-2 hrs/day, 5 days/week), OT (1-2 hrs/day, 5 days/week) and SLP (1-2 hrs/day, 5 days/week) to address physical and functional deficits in the context of the above medical diagnosis(es)? Yes Addressing deficits in the following areas: balance, endurance, locomotion, strength, transferring, bowel/bladder control, bathing, dressing, feeding, grooming, toileting, speech, swallowing and psychosocial support 5. Can the patient actively participate in an intensive therapy program of at least 3 hrs of therapy per day at least 5 days per week? Yes 6. The potential for patient to make measurable gains while on inpatient rehab is excellent 7. Anticipated functional outcomes upon discharge from inpatient rehab are supervision and min assist  with PT, supervision  and min assist with OT, modified independent and supervision with SLP. 8. Estimated rehab length of stay to reach the above functional goals is: 20-25 days 9. Anticipated D/C setting: Home 10. Anticipated post D/C treatments: HH therapy and Outpatient therapy 11. Overall Rehab/Functional Prognosis: excellent  RECOMMENDATIONS: This patient's condition is appropriate for continued rehabilitative care in the following setting: CIR Patient has agreed to participate in recommended program. Yes Note that insurance prior authorization may be required for reimbursement for recommended care.  Comment: Rehab Admissions Coordinator to follow up.  Thanks,  Ranelle Oyster, MD, Earlie Counts, PA-C 12/28/2016

## 2016-12-28 NOTE — Progress Notes (Signed)
STROKE TEAM PROGRESS NOTE   HISTORY OF PRESENT ILLNESS (per record) [name] [name] is a 46 y.o. male with a history of DM2, HTN, and current tobacco abuse (2-3 cigarettes per day), LSN 6:30 PM on 12/25/2016, who presented with slurred speech, difficulty walking, L-sided weakness, and L-sided facial droop.  After arrival, he was taken for a stat head CT, which was negative.  He was within the time window for tPA, which, after some difficulty controlling his blood pressure, was administered at 2027 on 12/25/2016.  MRI brain with 9 mm acute infarct in the posterior limb left internal capsule.  LKW: 6:30 PM on 12/25/2016 Modified Rankin Score: 1  Patient was administered IV t-PA at [address]217 on 12/25/2016. He was admitted to the neuro ICU for further evaluation and treatment.   SUBJECTIVE (INTERVAL HISTORY) His RN is at the bedside.  Pt is improving but remains dysarthric.   He continues to have tachycardia. Will obtain cardiology consult. Loop recorder interrogation does not show any atrial fibrillation  OBJECTIVE Temp:  [98.4 F (36.9 C)-99 F (37.2 C)] 98.5 F (36.9 C) (09/06 1422) Pulse Rate:  [73-90] 73 (09/06 1422) Cardiac Rhythm: Normal sinus rhythm (09/06 0749) Resp:  [16-20] 16 (09/06 1422) BP: (153-183)/(95-107) 153/99 (09/06 1422) SpO2:  [95 %-98 %] 97 % (09/06 1422)  CBC:   Recent Labs Lab 12/25/16 1902 12/25/16 1922 12/27/16 0236  WBC 9.4  --  10.7*  NEUTROABS 6.2  --   --   HGB 15.6 15.6 14.7  HCT 42.7 46.0 41.5  MCV 81.8  --  83.5  PLT 249  --  271    Basic Metabolic Panel:   Recent Labs Lab 12/25/16 1902 12/25/16 1922 12/26/16 0226  NA 135 134* 136  K 3.2* 4.3 3.5  CL 97* 97* 100*  CO2 25  --  23  GLUCOSE 398* 403* 363*  BUN 9 12 10   CREATININE 1.02 0.80 1.02  CALCIUM 9.9  --  9.9    Lipid Panel:     Component Value Date/Time   CHOL 222 (H) 12/26/2016 0226   TRIG 439 (H) 12/26/2016 0226   HDL 39 (L) 12/26/2016 0226   CHOLHDL 5.7 12/26/2016 0226   VLDL  UNABLE TO CALCULATE IF TRIGLYCERIDE OVER 400 mg/dL 23/30/0762 2633   LDLCALC UNABLE TO CALCULATE IF TRIGLYCERIDE OVER 400 mg/dL 35/45/6256 3893   TDSK8J:  Lab Results  Component Value Date   HGBA1C 12.3 (H) 12/26/2016   Urine Drug Screen:     Component Value Date/Time   LABOPIA NONE DETECTED 12/25/2016 1949   COCAINSCRNUR NONE DETECTED 12/25/2016 1949   COCAINSCRNUR NEGATIVE 03/09/2013 1841   LABBENZ NONE DETECTED 12/25/2016 1949   AMPHETMU NONE DETECTED 12/25/2016 1949   THCU NONE DETECTED 12/25/2016 1949   LABBARB NONE DETECTED 12/25/2016 1949    Alcohol Level     Component Value Date/Time   ETH <5 12/25/2016 1902    IMAGING  Ct Angio Head W Or Wo Contrast Ct Angio Neck W And/or Wo Contrast 12/25/2016 IMPRESSION: 1. Negative for emergent large vessel occlusion but positive for severe stenosis of the right PCA P1 and P2 segments. 2. No arterial occlusion identified. Mild or up to moderate irregularity of other circle of Willis branches, including the left PCA and left MCA. 3. No Basilar Artery or extracranial atherosclerosis or stenosis. 4. No acute findings in the neck.  Prior ACDF.   Ct Head Wo Contrast 12/25/2016 IMPRESSION: 1. No acute intracranial abnormality identified. 2. Stable advanced  chronic microvascular ischemic changes of the brain for age with multiple chronic lacunar infarcts in basal ganglia and the pons.   Ct Head Code Stroke Wo Contrast 12/25/2016 IMPRESSION: 1. No acute cortically based infarct or acute intracranial hemorrhage identified. 2. ASPECTS is 10. 3. Markedly advanced cerebral volume loss and new evidence of bilateral small vessel ischemia since a prior head CT in 2012. 4.  MRI Brain Wo Contrast 12/26/2016 IMPRESSION: 1. 9 mm acute infarct in the posterior limb left internal capsule. 2. Chronic small vessel ischemia with multiple remote lacunar infarcts, extensive for age.  TTE 12/26/2016  Left ventricle: The cavity size was normal. There was mild    concentric hypertrophy. Systolic function was normal. The   estimated ejection fraction was in the range of 50% to 55%. Wall   motion was normal; there were no regional wall motion   abnormalities     PHYSICAL EXAM Pleasant middle-age Caucasian male currently not in distress. . Afebrile. Head is nontraumatic. Neck is supple without bruit.    Cardiac exam no murmur or gallop. Lungs are clear to auscultation. Distal pulses are well felt. Blood pressure (!) 149/99, pulse 94, temperature 98.1 F (36.7 C), temperature source Axillary, resp. rate 16, height  (1.753 m), weight 182 lb 5.1 oz (82.7 kg), SpO2 97 %. Neurological exam ;  Awake alert oriented 3. Severe dysarthria but can be understood. No aphasia. Extraocular moments are full range but he has saccadic dysmetria in either direction. Mild right lower facial weakness. Tongue midline. Weak cough and gag. Motor system exam no upper or lower extremity drift but mild weakness of right grip and intrinsic hand muscles. Fine finger movements are diminished on the right. Orbits left over right apex daily. Symmetric lower extremity strength. Deep tendon reflexes are symmetric. Sensation intact. Plantars downgoing. Gait not tested. ASSESSMENT/PLAN [name] is a 46 y.o. male with history of DM2, HTN, and current tobacco abuse (2-3 cigarettes per day), LSN 6:30 PM on 12/25/2016, who presented with slurred speech, difficulty walking, L-sided weakness, and L-sided facial droop. He received IV t-PA at 2027 on 12/25/2016.   Stroke: 9 mm acute infarct in the posterior limb left internal capsule in the setting of advanced small vessel disease, multiple remote lacunar infarcts, and poorly controlled diabetes and hyperlipidemia.  Resultant  Dysarthria and right face weakness  CT head: no acute stroke  MRI head: . 9 mm acute infarct in the posterior limb left internal capsule  MRA head: not performed  CTA head/neck: Severe R P1 and P2 segment  stenosis 2D Echo  Left ventricle: The cavity size was normal. There was mild   concentric hypertrophy. Systolic function was normal. The   estimated ejection fraction was in the range of 50% to 55%. Wall   motion was normal; there were no regional wall motion    abnormalities   LDL > 400  HgbA1c 12.3  SCDs for VTE prophylaxis DIET DYS 3 Room service appropriate? Yes; Fluid consistency: Nectar Thick  aspirin 81 mg daily prior to admission, now on No antithrombotic  Patient counseled to be compliant with his antithrombotic medications  Ongoing aggressive stroke risk factor management  Therapy recommendations: pending  Disposition:  pending  Hypertension  Unstable  Clevidipine gtt for initial control, discontinued last night  Permissive hypertension (OK if < 220/120) but gradually normalize in 5-7 days  Long-term BP goal normotensive  Hyperlipidemia  Home meds: atorvastatin 80 mg PO, resumed in hospital  Pt not taking  statin  LDL > 400, goal < 70  Continue statin at discharge  Diabetes  HgbA1c 12.3, goal < 7.0  Uncontrolled  Home regimen of lantus 50 units QD, 20 units lantus QHS, and lispro 20 units TID with meals  Pt no taking full prescribed dose of home lantus  Diabetes Coordinator consulted  Other Stroke Risk Factors  Cigarette smoker, advised to stop smoking  Other Active Problems  ?UTI: rare bacteria in UA, no squamous cells  Hospital day # 3  I have personally examined this patient, reviewed notes, independently viewed imaging studies, participated in medical decision making and plan of care.ROS completed by me personally and pertinent positives fully documented  I have made any additions or clarifications directly to the above note.  He presented with slurred speech and facial droop secondary to small left subcortical infarct from small vessel disease likely related to uncontrolled multiple risk factors. Recommend Plavix for stroke prevention  and consult to quit smoking. Strict control of hypertension diabetes and hyperlipidemia. Recommend mobilize out of bed with physical occupational therapy and rehabilitation consults.  . Loop recorder interrogation by cardiology does not show atrial fibrillation.. Patient will likely need rehabilitation in a significant setting as he does not have 24-hour care at home. Greater than 50% time during this 25 minute visit was spent on counseling and coordination of care about his stroke and loop recorder on answering questions      Delia Heady, MD Medical Director [facility] Pager: [phone] 12/28/2016 4:31 PM   To contact Stroke Continuity provider, please refer to [email or web address]. After hours, contact General Neurology

## 2016-12-28 NOTE — Evaluation (Signed)
Occupational Therapy Evaluation Patient Details Name: [name] [name] MRN: [id number] DOB: [birth year] Today's Date: 12/28/2016    History of Present Illness Pt is a 46 y/o male w/ h/o previous stroke admitted with new onset of symptoms involving his dominant right side. MRI revealed 9mm acute infarct in posterior limb left internal capsule. PMH includes: CVA, HTN, hyperlipidemia, DM, smoker, UTI.   Clinical Impression   Pt admitted as above currently demonstrating deficits in all aspects of ADL's, functional mobility and transfers (see OT problem list below). He was living with his sister prior to this event secondary to a CVA last March per his report. He is currently +2 assist for safety during transfers and for sitting EOB +1 as he demonstrates significant right lateral lean. He should benefit from acute OT followed by CIR for intensive in-pt Rehab with goal to return home with sister.    Follow Up Recommendations  CIR;Supervision/Assistance - 24 hour    Equipment Recommendations  Other (comment) (Defer to next venue)    Recommendations for Other Services Rehab consult     Precautions / Restrictions Precautions Precautions: Fall Restrictions Weight Bearing Restrictions: No      Mobility Bed Mobility Overal bed mobility: Needs Assistance Bed Mobility: Supine to Sit     Supine to sit: Max assist;HOB elevated     General bed mobility comments: VC's and physical assist with pad and to trunk. Pt initially with posterior lean and right sided lean  Transfers Overall transfer level: Needs assistance   Transfers: Stand Pivot Transfers   Stand pivot transfers: Max assist;+2 physical assistance       General transfer comment: VC's and second person for safety, assist with trunk and RUE positioning    Balance Overall balance assessment: Needs assistance Sitting-balance support: Single extremity supported;Feet supported Sitting balance-Leahy Scale: Poor Sitting balance -  Comments: Max A as pt w/ significant lean to right. Able to assist some with VC's but then resumes right lean. "I can't stay here long" Postural control: Right lateral lean   Standing balance-Leahy Scale: Poor Standing balance comment: SPT from EOB to chair w/ Max assist (+2 for safety)                           ADL either performed or assessed with clinical judgement   ADL Overall ADL's : Needs assistance/impaired Eating/Feeding: Set up;Minimal assistance;Sitting Eating/Feeding Details (indicate cue type and reason): Min A to open packages and for set up of tray. Grooming: Moderate assistance;Bed level Grooming Details (indicate cue type and reason): Difficulty secondary to being right hand dominant. HOB elevated Upper Body Bathing: Moderate assistance;Bed level Upper Body Bathing Details (indicate cue type and reason): HOB elevated Lower Body Bathing: Maximal assistance;Sit to/from stand;Cueing for safety   Upper Body Dressing : Maximal assistance;Cueing for safety;Bed level   Lower Body Dressing: Sit to/from stand;Maximal assistance;+2 for safety/equipment   Toilet Transfer: Maximal assistance;+2 for physical assistance;+2 for safety/equipment;Stand-pivot;BSC (Simulated transfer from EOB to chair w/ +2 assist)   Toileting- Clothing Manipulation and Hygiene: +2 for physical assistance;+2 for safety/equipment;Maximal assistance;Sit to/from stand       Functional mobility during ADLs: +2 for safety/equipment;+2 for physical assistance;Cueing for safety;Cueing for sequencing General ADL Comments: Pt was assessed followed by ADL retraining session to consist of sitting balance at EOB x5 min given Mod-Max asssit as pt has posterior and right lateral lean noted. Pt able to assist when given vc's but unable to hold with  less than Mod A. Pt was agreeable to transfer from EOB to chair with focus on SPT +2 assist so he could eat sitting up. Self feeding performed after set up.      Vision Baseline Vision/History: No visual deficits (Per pt report) Patient Visual Report:  (Pt denies any changes. Cont to assess in functional context)       Perception     Praxis      Pertinent Vitals/Pain Pain Assessment: No/denies pain     Hand Dominance Right   Extremity/Trunk Assessment Upper Extremity Assessment Upper Extremity Assessment: RUE deficits/detail RUE Deficits / Details: R sided hemiplegia with tone. No A/ROM. R hand dominant RUE Coordination: decreased fine motor;decreased gross motor   Lower Extremity Assessment Lower Extremity Assessment: Defer to PT evaluation       Communication Communication Communication: Expressive difficulties   Cognition Arousal/Alertness: Awake/alert Behavior During Therapy: WFL for tasks assessed/performed Overall Cognitive Status: Within Functional Limits for tasks assessed                                     General Comments       Exercises     Shoulder Instructions      Home Living Family/patient expects to be discharged to:: Inpatient rehab Living Arrangements: Other relatives (Lives with sister)                           Home Equipment: Walker - 2 wheels          Prior Functioning/Environment Level of Independence: Independent                 OT Problem List: Decreased strength;Impaired balance (sitting and/or standing);Decreased cognition;Decreased knowledge of precautions;Decreased range of motion;Decreased activity tolerance;Decreased coordination;Decreased knowledge of use of DME or AE;Impaired sensation;Impaired UE functional use;Decreased safety awareness;Impaired tone      OT Treatment/Interventions: Self-care/ADL training;DME and/or AE instruction;Therapeutic activities;Balance training;Neuromuscular education;Patient/family education    OT Goals(Current goals can be found in the care plan section) Acute Rehab OT Goals Patient Stated Goal: Pt unable to  state Time For Goal Achievement: 01/11/17 Potential to Achieve Goals: Good  OT Frequency: Min 2X/week   Barriers to D/C:            Co-evaluation              AM-PAC PT "6 Clicks" Daily Activity     Outcome Measure Help from another person eating meals?: A Little Help from another person taking care of personal grooming?: A Little Help from another person toileting, which includes using toliet, bedpan, or urinal?: A Lot Help from another person bathing (including washing, rinsing, drying)?: A Lot Help from another person to put on and taking off regular upper body clothing?: A Lot Help from another person to put on and taking off regular lower body clothing?: Total 6 Click Score: 13   End of Session Equipment Utilized During Treatment: Gait belt Nurse Communication: Mobility status;Precautions  Activity Tolerance: Patient tolerated treatment well Patient left: in chair;with call bell/phone within reach;with nursing/sitter in room  OT Visit Diagnosis: Other symptoms and signs involving the nervous system (R29.898);Cognitive communication deficit (R41.841);Hemiplegia and hemiparesis;Other abnormalities of gait and mobility (R26.89);Muscle weakness (generalized) (M62.81) Symptoms and signs involving cognitive functions: Cerebral infarction Hemiplegia - Right/Left: Right Hemiplegia - dominant/non-dominant: Dominant Hemiplegia - caused by: Cerebral infarction  Time: 3846-6599 OT Time Calculation (min): 31 min Charges:  OT General Charges $OT Visit: 1 Visit OT Evaluation $OT Eval Moderate Complexity: 1 Mod OT Treatments $Therapeutic Activity: 8-22 mins G-Codes:      [place]hill, Amy Beth Dixon, OTR/L 12/28/2016, 10:21 AM

## 2016-12-28 NOTE — Progress Notes (Signed)
Inpatient Diabetes Program Recommendations  AACE/ADA: New Consensus Statement on Inpatient Glycemic Control (2015)  Target Ranges:  Prepandial:   less than 140 mg/dL      Peak postprandial:   less than 180 mg/dL (1-2 hours)      Critically ill patients:  140 - 180 mg/dL   Results for [name] [name], [name] [name] (MRN [id number]) as of 12/28/2016 12:24  Ref. Range 12/28/2016 03:57 12/28/2016 08:28 12/28/2016 12:07  Glucose-Capillary Latest Ref Range: 65 - 99 mg/dL 010 (H) 272 (H) 536 (H)   Review of Glycemic Control  Diabetes history: DM 2 Outpatient Diabetes medications: Lantus 20 units QHS, Humalog 20 units tid with meals Current orders for Inpatient glycemic control: Lantus 20 units, Novolog Moderate Correction 0-15 units Q4 hours  Inpatient Diabetes Program Recommendations:    Glucose in 200's. Consider increasing Lantus to 22 units Daily. PO intake at 50% or lower. If glucose trends continue to be elevated with poor po intake consider and increase in Novolog Correction scale.   Thanks, Christena Deem RN, MSN, [facility] Inpatient Diabetes Coordinator Team Pager [phone] (8a-5p)

## 2016-12-28 NOTE — Progress Notes (Signed)
Modified Barium Swallow Progress Note  Patient Details  Name: [name] [name] MRN: [id number] Date of Birth: 11/17/1970  Today's Date: 12/28/2016  Modified Barium Swallow completed.  Full report located under Chart Review in the Imaging Section.  Brief recommendations include the following:  Clinical Impression  MBS provided clearer results of swallow function. Pts oral manipulation of solids and strength and presence of residuals much improved this session. This is likely secondary to decreased standing secretions and general strength and improved participation rather than dramatic change in pharyngeal strength over just two days. Timing of swallow initiation is still a significant barrier to safety with thin liquids. Despite cueing for a chin tuck, breath hold and hold and swallow strategy, pt aspirated thin liquids before/during the swallow due to delayed initiation. Penetration was silent, but aspiration was sensed within 3 seconds of swallow. There were instances of timely initiation and tolerance with small sips but pt was not consistent. Recommend an upgrade to dys 3 solids and nectar thick liquids with an intermittent throat clear to reduce silent penetration events. Cues to hold sip orally and swallow with control to increase awareness were helpful. Would consider upgrade to water protocol in the future as pt continues to improve. Recommend CIR at d/c.    Swallow Evaluation Recommendations       SLP Diet Recommendations: Dysphagia 3 (Mech soft) solids;Nectar thick liquid   Liquid Administration via: Cup   Medication Administration: Whole meds with puree   Supervision: Comment (Pt needs assist)   Compensations: Slow rate;Small sips/bites;Multiple dry swallows after each bite/sip   Postural Changes: Seated upright at 90 degrees   Oral Care Recommendations: Oral care BID   Other Recommendations: Order thickener from pharmacy   [facility], MA CCC-SLP [phone]  Kathlean Cinco,  Riley Nearing 12/28/2016,9:39 AM

## 2016-12-28 NOTE — Progress Notes (Signed)
I met with [name] [name] at bedside and then contacted his sister by phone, Jackelyn Poling, With his permission. [name] [name] previously lived in [place] where he had a CVA and then received inpt rehab in 2017 at [facility]. [name] [name] legally separated and estranged from his 2 adult children. [name] [name] has lived with his sister for 9 months.Sister can not provide the 24/7 assist he needs and states he will eventually need ALF. She is requesting SNF with his [facility] benefits. I have alerted SW and we will sign off. [phone]

## 2016-12-28 NOTE — Consult Note (Signed)
ELECTROPHYSIOLOGY CONSULT NOTE    Patient ID: [name] [name] MRN: [id number], DOB/AGE: 44-Jun-1972 44 y.o.  Admit date: 12/25/2016 Date of Consult: 12/28/2016  Primary Physician: [facility] Primary Cardiologist: [place]  Patient Profile: [name] [name] is a 46 y.o. male with a history of tachycardia, diabetes, and hypertension who is being seen today for the evaluation of ILR evaluation at the request of Dr Pearlean Brownie.  HPI:  [name] [name] is a 46 y.o. male admitted with slurred speech and left sided weakness.  He has been found to have a lacunar stroke.  ILR was previously implanted in [place] for evaluation of palpitations.  EP has been asked to interrogate ILR and evaluate for arrhythmias.  He denies chest pain, palpitations, dyspnea, PND, orthopnea, nausea, vomiting, dizziness, syncope, edema, weight gain, or early satiety.  Past Medical History:  Diagnosis Date  . Diabetes mellitus without complication (HCC)   . Hypertension   . Stroke [facility])      Surgical History:  Past Surgical History:  Procedure Laterality Date  . APPENDECTOMY    . BACK SURGERY       Prescriptions Prior to Admission  Medication Sig Dispense Refill Last Dose  . amLODipine (NORVASC) 10 MG tablet Take 10 mg by mouth at bedtime.    12/24/2016 at Unknown time  . aspirin EC 81 MG tablet Take 81 mg by mouth daily.   12/24/2016 at Unknown time  . atenolol (TENORMIN) 50 MG tablet Take 50 mg by mouth at bedtime.    12/24/2016 at Unknown time  . atorvastatin (LIPITOR) 80 MG tablet Take 80 mg by mouth daily.   12/24/2016 at Unknown time  . citalopram (CELEXA) 40 MG tablet Take 40 mg by mouth daily.   12/24/2016 at Unknown time  . folic acid (FOLVITE) 1 MG tablet Take 1 mg by mouth daily.   12/24/2016 at Unknown time  . insulin glargine (LANTUS) 100 UNIT/ML injection Inject 20 Units into the skin at bedtime.   Past Week at Unknown time  . insulin lispro (HUMALOG) 100 UNIT/ML injection Inject 20 Units into the skin 3  (three) times daily.   Past Week at Unknown time  . lisinopril (PRINIVIL,ZESTRIL) 40 MG tablet Take 40 mg by mouth daily.   12/24/2016 at Unknown time  . ondansetron (ZOFRAN) 4 MG tablet Take 1 tablet (4 mg total) by mouth every 8 (eight) hours as needed for nausea or vomiting. 8 tablet 0 PRN  . sertraline (ZOLOFT) 100 MG tablet Take 100 mg by mouth daily.   12/24/2016 at Unknown time  . chlorthalidone (HYGROTON) 50 MG tablet Take 50 mg by mouth daily.   Unknown at Unknown    Inpatient Medications: . amLODipine  10 mg Oral QHS  . aspirin  325 mg Oral Daily  . atenolol  50 mg Oral QHS  . atorvastatin  80 mg Oral q1800  . chlorthalidone  50 mg Oral Daily  . citalopram  40 mg Oral Daily  . folic acid  1 mg Oral Daily  . insulin aspart  0-15 Units Subcutaneous Q4H  . insulin glargine  20 Units Subcutaneous Daily  . lisinopril  40 mg Oral Daily  . living well with diabetes book   Does not apply Once  . pantoprazole (PROTONIX) IV  40 mg Intravenous QHS  . sertraline  100 mg Oral Daily    Allergies:  Allergies  Allergen Reactions  . Penicillins Other (See Comments)    Has patient had a  PCN reaction causing immediate rash, facial/tongue/throat swelling, SOB or lightheadedness with hypotension: Yes Has patient had a PCN reaction causing severe rash involving mucus membranes or skin necrosis: No Has patient had a PCN reaction that required hospitalization: No Has patient had a PCN reaction occurring within the last 10 years: No If all of the above answers are "NO", then may proceed with Cephalosporin use.     Social History   Social History  . Marital status: Legally Separated    Spouse name: N/A  . Number of children: N/A  . Years of education: N/A   Occupational History  . Not on file.   Social History Main Topics  . Smoking status: Current Every Day Smoker  . Smokeless tobacco: Never Used  . Alcohol use No  . Drug use: No  . Sexual activity: Not on file   Other Topics Concern   . Not on file   Social History Narrative  . No narrative on file     History reviewed. No pertinent family history.   Review of Systems: All other systems reviewed and are otherwise negative except as noted above.  Physical Exam: Vitals:   12/27/16 2054 12/28/16 0053 12/28/16 0418 12/28/16 1047  BP: (!) 183/107 (!) 166/102 (!) 166/95 (!) 157/96  Pulse: 90 83 80 81  Resp: Temp: 98.6 F (37 C) 98.4 F (36.9 C) 99 F (37.2 C) 99 F (37.2 C)  TempSrc: Oral Oral Oral Oral  SpO2: 98% 98% 95% 97%  Weight:      Height:        GEN- The patient is well appearing, alert and oriented x 3 today.   HEENT: normocephalic, atraumatic; sclera clear, conjunctiva pink; hearing intact; oropharynx clear; neck supple Lungs- Clear to ausculation bilaterally, normal work of breathing.  No wheezes, rales, rhonchi Heart- Regular rate and rhythm, no murmurs, rubs or gallops GI- soft, non-tender, non-distended, bowel sounds present Extremities- no clubbing, cyanosis, or edema; DP/PT/radial pulses 2+ bilaterally MS- no significant deformity or atrophy Skin- warm and dry, no rash or lesion Psych- euthymic mood, full affect Neuro- strength and sensation are intact  Labs:  Lab Results  Component Value Date   WBC 10.7 (H) 12/27/2016   HGB 14.7 12/27/2016   HCT 41.5 12/27/2016   MCV 83.5 12/27/2016   PLT 271 12/27/2016    Recent Labs Lab 12/26/16 0226  NA 136  K 3.5  CL 100*  CO2 23  BUN 10  CREATININE 1.02  CALCIUM 9.9  PROT 7.5  BILITOT 0.9  ALKPHOS 86  ALT 26  AST 25  GLUCOSE 363*      Radiology/Studies: Ct Angio Head W Or Wo Contrast  Result Date: 12/25/2016 CLINICAL DATA:  46 year old male code stroke with ataxia. EXAM: CT ANGIOGRAPHY HEAD AND NECK TECHNIQUE: Multidetector CT imaging of the head and neck was performed using the standard protocol during bolus administration of intravenous contrast. Multiplanar CT image reconstructions and MIPs were obtained to  evaluate the vascular anatomy. Carotid stenosis measurements (when applicable) are obtained utilizing NASCET criteria, using the distal internal carotid diameter as the denominator. CONTRAST:  50 mL Isovue 370 COMPARISON:  CT head without contrast 1918 hours today. FINDINGS: CTA NECK Skeleton: Prior C3-C4 ACDF with solid arthrodesis. No acute osseous abnormality identified. Upper chest: Negative lung apices. No superior mediastinal lymphadenopathy. Other neck: Negative; bilateral cervical lymph nodes are within normal limits. Aortic arch: 3 vessel arch configuration. Minimal arch atherosclerosis. Right carotid system: Negative.  Left carotid system: Negative. Vertebral arteries: No proximal right subclavian artery plaque or stenosis. Normal right vertebral artery origin. Normal right vertebral artery to the skullbase. Mild soft plaque at the left subclavian artery origin without stenosis. Normal left vertebral artery origin. The left vertebral artery is mildly dominant and normal to the skullbase. CTA HEAD Posterior circulation: Mildly dominant distal left vertebral artery. Normal left PICA origin. No distal vertebral stenosis. Patent vertebrobasilar junction and dominant appearing right AICA origin. No basilar stenosis. Patent SCA and PCA origins. Posterior communicating arteries are diminutive or absent. Rapid tapering of the right PCA with severe stenosis in the distal P1 and P2 segment (series 10, image 19). Preserved distal enhancement, but P3 branch irregularity. Mild to moderate irregularity and stenosis of the contralateral left PCA P1 and P2 segments. Anterior circulation: Patent ICA siphons with no siphon plaque or stenosis. Normal ophthalmic artery origins. Patent carotid termini. Normal MCA and ACA origins. Anterior communicating artery and bilateral ACA branches are within normal limits. Left MCA M1 segment is mildly irregular but without stenosis. Patent left MCA trifurcation. Left MCA branches are  within normal limits. Right MCA M1 segment and right MCA bifurcation are patent without stenosis. Right MCA branches are within normal limits. Venous sinuses: Patent. Anatomic variants: Mildly dominant left vertebral artery. Review of the MIP images confirms the above findings IMPRESSION: 1. Negative for emergent large vessel occlusion but positive for severe stenosis of the right PCA P1 and P2 segments. This was reviewed in person with Dr. Ritta Slot on 12/25/2016 at 1940 hours. 2. No arterial occlusion identified. Mild or up to moderate irregularity of other circle of Willis branches, including the left PCA and left MCA. 3. No Basilar Artery or extracranial atherosclerosis or stenosis. 4. No acute findings in the neck.  Prior ACDF. Electronically Signed   By: Odessa Fleming M.D.   On: 12/25/2016 19:57   Ct Head Wo Contrast  Result Date: 12/26/2016 CLINICAL DATA:  Follow-up examination status post tPA. EXAM: CT HEAD WITHOUT CONTRAST TECHNIQUE: Contiguous axial images were obtained from the base of the skull through the vertex without intravenous contrast. COMPARISON:  Prior CT from 12/25/2016. FINDINGS: Brain: Cerebral atrophy with chronic microvascular ischemic disease, stable. Evolving 9 mm acute lacunar type infarction at the posterior limb of the left internal capsule, stable from recent MRI. No acute intracranial hemorrhage status post tPA. No new acute large vessel territory infarct. No mass lesion, midline shift or mass effect. No hydrocephalus. No extra-axial fluid collection. Vascular: No asymmetric hyperdense vessel. Intracranial atherosclerosis again noted. Skull: Scalp soft tissues and calvarium within normal limits. Sinuses/Orbits: Globes and orbital soft tissues within normal limits. Scattered mucosal thickening within the ethmoidal air cells. Paranasal sinuses are otherwise clear. No mastoid effusion. Other: None. IMPRESSION: 1. No acute intracranial hemorrhage status post tPA. 2. Evolving 9 mm  acute ischemic infarct at the posterior limb of the left internal capsule, stable from recent MRI. 3. No other new acute intracranial process. Electronically Signed   By: Rise Mu M.D.   On: 12/26/2016 21:26   Ct Head Wo Contrast  Result Date: 12/25/2016 CLINICAL DATA:  46 y/o M; sudden onset of slurred speech and right-sided weakness. EXAM: CT HEAD WITHOUT CONTRAST TECHNIQUE: Contiguous axial images were obtained from the base of the skull through the vertex without intravenous contrast. COMPARISON:  CTA and CT angiogram of the head dated 12/25/2016. FINDINGS: Brain: No large acute infarct or intracranial hemorrhage identified. Multiple stable foci of hypoattenuation in subcortical  and periventricular white matter, likely represent chronic microvascular ischemic changes in the setting of diabetes hypertension. Small lucencies are present in the right caudate head, bile lentiform nucleus, bilateral thalamus, right mid corona radiata, and the left pons compatible with chronic lacunar infarctions. Stable brain parenchymal volume loss. Vascular: No hyperdense vessel or unexpected calcification. Skull: Normal. Negative for fracture or focal lesion. Sinuses/Orbits: No acute finding. Other: None. IMPRESSION: 1. No acute intracranial abnormality identified. 2. Stable advanced chronic microvascular ischemic changes of the brain for age with multiple chronic lacunar infarcts in basal ganglia and the pons. Electronically Signed   By: Mitzi Hansen M.D.   On: 12/25/2016 23:49   Ct Angio Neck W And/or Wo Contrast  Result Date: 12/25/2016 CLINICAL DATA:  46 year old male code stroke with ataxia. EXAM: CT ANGIOGRAPHY HEAD AND NECK TECHNIQUE: Multidetector CT imaging of the head and neck was performed using the standard protocol during bolus administration of intravenous contrast. Multiplanar CT image reconstructions and MIPs were obtained to evaluate the vascular anatomy. Carotid stenosis measurements  (when applicable) are obtained utilizing NASCET criteria, using the distal internal carotid diameter as the denominator. CONTRAST:  50 mL Isovue 370 COMPARISON:  CT head without contrast 1918 hours today. FINDINGS: CTA NECK Skeleton: Prior C3-C4 ACDF with solid arthrodesis. No acute osseous abnormality identified. Upper chest: Negative lung apices. No superior mediastinal lymphadenopathy. Other neck: Negative; bilateral cervical lymph nodes are within normal limits. Aortic arch: 3 vessel arch configuration. Minimal arch atherosclerosis. Right carotid system: Negative. Left carotid system: Negative. Vertebral arteries: No proximal right subclavian artery plaque or stenosis. Normal right vertebral artery origin. Normal right vertebral artery to the skullbase. Mild soft plaque at the left subclavian artery origin without stenosis. Normal left vertebral artery origin. The left vertebral artery is mildly dominant and normal to the skullbase. CTA HEAD Posterior circulation: Mildly dominant distal left vertebral artery. Normal left PICA origin. No distal vertebral stenosis. Patent vertebrobasilar junction and dominant appearing right AICA origin. No basilar stenosis. Patent SCA and PCA origins. Posterior communicating arteries are diminutive or absent. Rapid tapering of the right PCA with severe stenosis in the distal P1 and P2 segment (series 10, image 19). Preserved distal enhancement, but P3 branch irregularity. Mild to moderate irregularity and stenosis of the contralateral left PCA P1 and P2 segments. Anterior circulation: Patent ICA siphons with no siphon plaque or stenosis. Normal ophthalmic artery origins. Patent carotid termini. Normal MCA and ACA origins. Anterior communicating artery and bilateral ACA branches are within normal limits. Left MCA M1 segment is mildly irregular but without stenosis. Patent left MCA trifurcation. Left MCA branches are within normal limits. Right MCA M1 segment and right MCA  bifurcation are patent without stenosis. Right MCA branches are within normal limits. Venous sinuses: Patent. Anatomic variants: Mildly dominant left vertebral artery. Review of the MIP images confirms the above findings IMPRESSION: 1. Negative for emergent large vessel occlusion but positive for severe stenosis of the right PCA P1 and P2 segments. This was reviewed in person with Dr. Ritta Slot on 12/25/2016 at 1940 hours. 2. No arterial occlusion identified. Mild or up to moderate irregularity of other circle of Willis branches, including the left PCA and left MCA. 3. No Basilar Artery or extracranial atherosclerosis or stenosis. 4. No acute findings in the neck.  Prior ACDF. Electronically Signed   By: Odessa Fleming M.D.   On: 12/25/2016 19:57   Mr Brain Wo Contrast  Result Date: 12/26/2016 CLINICAL DATA:  Slurred speech with facial droop.  Status post tPA. History of diabetes and hypertension. EXAM: MRI HEAD WITHOUT CONTRAST TECHNIQUE: Multiplanar, multiecho pulse sequences of the brain and surrounding structures were obtained without intravenous contrast. COMPARISON:  Head CT and CTA from yesterday FINDINGS: Brain: 9 mm ovoid acute infarct in the posterior limb left internal capsule. There is advanced chronic ischemic injury, especially for age, with nearly confluent the periventricular gliotic signal and chronic lacunes in the pons, bilateral thalamus, and bilateral deep white matter tracts. T2 hyperintensity in the bilateral middle cerebellar peduncle is symmetric and likely wallerian changes. Overall pattern history not typical for demyelination. The pons and cerebellum is atrophic. Small remote micro hemorrhages seen in the brainstem along the left cerebellum. No acute hemorrhage, hydrocephalus, or masslike findings. Vascular: Recent CTA.  Major flow voids are preserved. Skull and upper cervical spine: Negative for marrow lesion Sinuses/Orbits: Negative IMPRESSION: 1. 9 mm acute infarct in the posterior  limb left internal capsule. 2. Chronic small vessel ischemia with multiple remote lacunar infarcts, extensive for age. Electronically Signed   By: Marnee Spring M.D.   On: 12/26/2016 14:41   Dg Swallowing Func-speech Pathology  Result Date: 12/28/2016 Objective Swallowing Evaluation: Type of Study: MBS-Modified Barium Swallow Study Patient Details Name: VASHAUN OSMON MRN: [id number] Date of Birth: June 06, 1970 Today's Date: 12/28/2016 Time: SLP Start Time (ACUTE ONLY): 0855-SLP Stop Time (ACUTE ONLY): 0913 SLP Time Calculation (min) (ACUTE ONLY): 18 min Past Medical History: Past Medical History: Diagnosis Date . Diabetes mellitus without complication (HCC)  . Hypertension  . Stroke [facility])  Past Surgical History: Past Surgical History: Procedure Laterality Date . APPENDECTOMY   . BACK SURGERY   HPI: AJEET CASASOLA a 46 y.o.malewho was last seen well 6:30 PM which time he began experiencing slurred speech, difficult walking, facial droop. He has a history of diabetes and hypertension and smokes 2-3 cigarettes per day. After arrival, he was taken for a stat head CT which was negative, was within the time window for TPA he received  No Data Recorded Assessment / Plan / Recommendation CHL IP CLINICAL IMPRESSIONS 12/28/2016 Clinical Impression MBS provided clearer results of swallow function. Pts oral manipulation of solids and strength and presence of residuals much improved this session. This is likely secondary to decreased standing secretions and general strength and improved participation rather than dramatic change in pharyngeal strength over just two days. Timing of swallow initiation is still a significant barrier to safety with thin liquids. Despite cueing for a chin tuck, breath hold and hold and swallow strategy, pt aspirated thin liquids before/during the swallow due to delayed initiation. Penetration was silent, but aspiration was sensed within 3 seconds of swallow. There were instances of timely initiation  and tolerance with small sips but pt was not consistent. Recommend an upgrade to dys 3 solids and nectar thick liquids with an intermittent throat clear to reduce silent penetration events. Cues to hold sip orally and swallow with control to increase awareness were helpful. Would consider upgrade to water protocol in the future as pt continues to improve. Recommend CIR at d/c.  SLP Visit Diagnosis Dysphagia, oropharyngeal phase (R13.12) Attention and concentration deficit following -- Frontal lobe and executive function deficit following -- Impact on safety and function Moderate aspiration risk   CHL IP TREATMENT RECOMMENDATION 12/28/2016 Treatment Recommendations Therapy as outlined in treatment plan below   Prognosis 12/28/2016 Prognosis for Safe Diet Advancement Good Barriers to Reach Goals -- Barriers/Prognosis Comment -- CHL IP DIET RECOMMENDATION 12/28/2016 SLP Diet Recommendations Dysphagia 3 (  Mech soft) solids;Nectar thick liquid Liquid Administration via Cup Medication Administration Whole meds with puree Compensations Slow rate;Small sips/bites;Multiple dry swallows after each bite/sip Postural Changes Seated upright at 90 degrees   CHL IP OTHER RECOMMENDATIONS 12/28/2016 Recommended Consults -- Oral Care Recommendations Oral care BID Other Recommendations Order thickener from pharmacy   CHL IP FOLLOW UP RECOMMENDATIONS 12/28/2016 Follow up Recommendations Inpatient Rehab   CHL IP FREQUENCY AND DURATION 12/28/2016 Speech Therapy Frequency (ACUTE ONLY) min 2x/week Treatment Duration 2 weeks      CHL IP ORAL PHASE 12/28/2016 Oral Phase WFL Oral - Pudding Teaspoon -- Oral - Pudding Cup -- Oral - Honey Teaspoon -- Oral - Honey Cup NT Oral - Nectar Teaspoon -- Oral - Nectar Cup WFL Oral - Nectar Straw -- Oral - Thin Teaspoon -- Oral - Thin Cup -- Oral - Thin Straw -- Oral - Puree WFL Oral - Mech Soft -- Oral - Regular -- Oral - Multi-Consistency -- Oral - Pill -- Oral Phase - Comment --  CHL IP PHARYNGEAL PHASE 12/28/2016  Pharyngeal Phase Impaired Pharyngeal- Pudding Teaspoon -- Pharyngeal -- Pharyngeal- Pudding Cup -- Pharyngeal -- Pharyngeal- Honey Teaspoon -- Pharyngeal -- Pharyngeal- Honey Cup NT Pharyngeal -- Pharyngeal- Nectar Teaspoon -- Pharyngeal -- Pharyngeal- Nectar Cup Delayed swallow initiation-pyriform sinuses;Penetration/Aspiration before swallow;Penetration/Aspiration during swallow Pharyngeal Material enters airway, CONTACTS cords and then ejected out Pharyngeal- Nectar Straw Penetration/Aspiration before swallow;Penetration/Aspiration during swallow Pharyngeal Material enters airway, CONTACTS cords and not ejected out;Material enters airway, remains ABOVE vocal cords and not ejected out Pharyngeal- Thin Teaspoon -- Pharyngeal -- Pharyngeal- Thin Cup Penetration/Aspiration before swallow;Penetration/Aspiration during swallow;Moderate aspiration Pharyngeal Material enters airway, passes BELOW cords and not ejected out despite cough attempt by patient Pharyngeal- Thin Straw Penetration/Aspiration before swallow;Penetration/Aspiration during swallow Pharyngeal Material enters airway, passes BELOW cords and not ejected out despite cough attempt by patient;Material enters airway, passes BELOW cords without attempt by patient to eject out (silent aspiration) Pharyngeal- Puree WFL Pharyngeal -- Pharyngeal- Mechanical Soft -- Pharyngeal -- Pharyngeal- Regular WFL Pharyngeal -- Pharyngeal- Multi-consistency -- Pharyngeal -- Pharyngeal- Pill -- Pharyngeal -- Pharyngeal Comment --  No flowsheet data found. No flowsheet data found. De[place], Riley Nearing 12/28/2016, 9:41 AM              Ct Head Code Stroke Wo Contrast  Result Date: 12/25/2016 CLINICAL DATA:  Code stroke. 46 year old male with right side weakness. Last seen normal 1830 hours. EXAM: CT HEAD WITHOUT CONTRAST TECHNIQUE: Contiguous axial images were obtained from the base of the skull through the vertex without intravenous contrast. COMPARISON:  Head CT  01/15/2011. FINDINGS: Brain: Age advanced cerebral volume loss, significantly progressed since 2012. New patchy scattered bilateral cerebral white matter hypodensity. 3-4 mm hypodensity in the medial left thalamus. Chronic appearing lacunar infarct in the right lentiform. No acute intracranial hemorrhage identified. No midline shift, mass effect, or evidence of intracranial mass lesion. Ex vacuo appearing ventricular enlargement is new since 2012. No cortically based acute infarct identified. Vascular: Mild Calcified atherosclerosis at the skull base. No suspicious intracranial vascular hyperdensity. Skull: Negative. No acute osseous abnormality identified. Stable appearing C3-C4 ACDF changes on the scout view today and in 2012. Sinuses/Orbits: Visualized paranasal sinuses and mastoids are stable and well pneumatized. Other: Visualized orbit soft tissues are within normal limits. Visualized scalp soft tissues are within normal limits. ASPECTS [facility] Stroke Program Early CT Score) - Ganglionic level infarction (caudate, lentiform nuclei, internal capsule, insula, M1-M3 cortex): 7 - Supraganglionic infarction (M4-M6 cortex): 3 Total score (0-10  with 10 being normal): 10 IMPRESSION: 1. No acute cortically based infarct or acute intracranial hemorrhage identified. 2. ASPECTS is 10. 3. Markedly advanced cerebral volume loss and new evidence of bilateral small vessel ischemia since a prior head CT in 2012. 4. The above was relayed via text pager to Dr. Ritta Slot on 12/25/2016 at 19:27 . Electronically Signed   By: Odessa Fleming M.D.   On: 12/25/2016 19:28    EKG:ST rate 103 (personally reviewed)  TELEMETRY: SR/ST (personally reviewed)  Assessment/Plan: 1.  Palpitations With prior ILR implant No arrhythmias on device interrogation No AF Will follow remotely from [facility] going forward   Signed, Gypsy Balsam 12/28/2016 12:13 PM  I have seen, examined the patient, and reviewed the above assessment and  plan.  ILR interrogation is reviewed.  No arrhythmias are noted.  No further inpatient EP workup planned.  Changes to above are made where necessary.    Co Sign: Hillis Range, MD 12/28/2016 1:57 PM

## 2016-12-28 NOTE — Evaluation (Signed)
Physical Therapy Evaluation Patient Details Name: [name] [name] MRN: [id number] DOB: [birth year] Today's Date: 12/28/2016   History of Present Illness  Pt is a 46 y/o male w/ h/o previous stroke admitted with new onset of symptoms involving his dominant right side. MRI revealed 9mm acute infarct in posterior limb left internal capsule. PMH includes: CVA, HTN, hyperlipidemia, DM, smoker, UTI.  Clinical Impression  Pt is max assist to transfer using the steady standing frame from the bed to recliner.  He will need extensive post acute rehab at discharge.   PT to follow acutely for deficits listed below.       Follow Up Recommendations CIR    Equipment Recommendations  Wheelchair (measurements PT);Wheelchair cushion (measurements PT)    Recommendations for Other Services Rehab consult     Precautions / Restrictions Precautions Precautions: Fall Precaution Comments: right sided weakness      Mobility  Bed Mobility Overal bed mobility: Needs Assistance Bed Mobility: Rolling;Sidelying to Sit Rolling: Mod assist Sidelying to sit: Mod assist;HOB elevated       General bed mobility comments: Mod assist to support trunk, pt using left arm to pull to his side, assist at legs and to support trunk to transition to sitting.  Assist with bed pad to weight shift hips to scoot to EOB.   Transfers Overall transfer level: Needs assistance   Transfers: Stand Pivot Transfers;Sit to/from Stand Sit to Stand: +2 physical assistance;Max assist Stand pivot transfers: +2 physical assistance (using the steady standing frame)       General transfer comment: Two person assist to support trunk to come to standing with significant right lateral lean.  Verbal cues to weight shfit to the left , but pt unable to maintain in standing.  Used the steady standing frame to transfer him safely to the recliner chair.    Ambulation/Gait             General Gait Details: unable at this time.   Stairs             Wheelchair Mobility    Modified Rankin (Stroke Patients Only) Modified Rankin (Stroke Patients Only) Pre-Morbid Rankin Score: No significant disability Modified Rankin: Severe disability     Balance Overall balance assessment: Needs assistance Sitting-balance support: Feet supported;Single extremity supported Sitting balance-Leahy Scale: Poor Sitting balance - Comments: Mod assist to help pt maintain midline balance EOB.  Pt able to report he is leaning right, but only able to correct with left upper extremity pull.  Practiced reaching to the left and forward to counteract his right posterior push/lean. Postural control: Right lateral lean;Posterior lean Standing balance support: Single extremity supported Standing balance-Leahy Scale: Zero Standing balance comment: two person max assist.                              Pertinent Vitals/Pain Pain Assessment: Faces Faces Pain Scale: Hurts even more Pain Location: left shoulder (per pt report h/o bursitis) Pain Descriptors / Indicators: Aching;Sore Pain Intervention(s): Limited activity within patient's tolerance;Monitored during session;Repositioned    Home Living Family/patient expects to be discharged to:: Private residence Living Arrangements: Other relatives (sister) Available Help at Discharge: Family;Available PRN/intermittently (sister works) Type of Home: House       Home Layout: One level Home Equipment: Environmental consultant - 2 wheels      Prior Function                 Hand Dominance  Dominant Hand: Right    Extremity/Trunk Assessment   Upper Extremity Assessment Upper Extremity Assessment: Defer to OT evaluation    Lower Extremity Assessment Lower Extremity Assessment: RLE deficits/detail RLE Deficits / Details: right leg with weakness and increased tone, at a bed level MMT ankle was 2/5, hip flexion was 2/5 RLE Sensation:  (intact to LT) RLE Coordination: decreased gross motor     Cervical / Trunk Assessment Cervical / Trunk Assessment: Normal  Communication   Communication: Expressive difficulties (dysarthria)  Cognition Arousal/Alertness: Awake/alert Behavior During Therapy: WFL for tasks assessed/performed Overall Cognitive Status: Within Functional Limits for tasks assessed                                 General Comments: Not specifically tested, but aware of his right lean, knows he cannot get up on his own, and was able to report history questions accurately.        General Comments      Exercises     Assessment/Plan    PT Assessment Patient needs continued PT services  PT Problem List Decreased strength;Decreased range of motion;Decreased activity tolerance;Decreased balance;Decreased mobility;Decreased coordination;Decreased knowledge of precautions;Decreased knowledge of use of DME;Impaired tone       PT Treatment Interventions DME instruction;Gait training;Stair training;Functional mobility training;Therapeutic activities;Therapeutic exercise;Balance training;Neuromuscular re-education;Patient/family education;Wheelchair mobility training    PT Goals (Current goals can be found in the Care Plan section)  Acute Rehab PT Goals 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 : Unable Difficulty sitting down on and standing up from a chair with arms (e.g., wheelchair, bedside commode, etc,.)?: Unable Help needed moving to and from a bed to chair (including a wheelchair)?:  A Lot Help needed walking in hospital room?: Total Help needed climbing 3-5 steps with a railing? : Total 6 Click Score: 7    End of Session Equipment Utilized During Treatment: Gait belt Activity Tolerance: Patient limited by fatigue;Patient limited by pain Patient left: in chair;with call bell/phone within reach Nurse Communication: Mobility status;Need for lift equipment PT Visit Diagnosis: Difficulty in walking, not elsewhere classified (R26.2);Hemiplegia and hemiparesis Hemiplegia - Right/Left: Right Hemiplegia - dominant/non-dominant: Dominant Hemiplegia - caused by: Cerebral infarction    Time: 8811-0315 PT Time Calculation (min) (ACUTE ONLY): 27 min   Charges:   PT Evaluation $PT Eval Moderate Complexity: 1 Mod PT Treatments $Therapeutic Activity: 8-22 mins    Guillermo Difrancesco B. Benjamin Casanas, PT, DPT [phone]   12/28/2016, 10:30 PM

## 2016-12-29 ENCOUNTER — Telehealth: Payer: Self-pay | Admitting: Cardiology

## 2016-12-29 DIAGNOSIS — I6300 Cerebral infarction due to thrombosis of unspecified precerebral artery: Secondary | ICD-10-CM | POA: Diagnosis not present

## 2016-12-29 LAB — GLUCOSE, CAPILLARY
GLUCOSE-CAPILLARY: 145 mg/dL — AB (ref 65–99)
GLUCOSE-CAPILLARY: 277 mg/dL — AB (ref 65–99)
GLUCOSE-CAPILLARY: 269 mg/dL — AB (ref 65–99)
GLUCOSE-CAPILLARY: 237 mg/dL — AB (ref 65–99)
GLUCOSE-CAPILLARY: 140 mg/dL — AB (ref 65–99)
GLUCOSE-CAPILLARY: 233 mg/dL — AB (ref 65–99)

## 2016-12-29 MED ORDER — CLOPIDOGREL BISULFATE 75 MG PO TABS
75.0000 mg | ORAL_TABLET | Freq: Every day | ORAL | Status: DC
  Administered 2016-12-30 – 2017-01-02 (×4): 75 mg via ORAL
  Filled 2016-12-29 (×4): qty 1

## 2016-12-29 NOTE — Progress Notes (Signed)
  Speech Language Pathology Treatment: Dysphagia;Cognitive-Linquistic  Patient Details Name: [name] [name] MRN: [id number] DOB: August 16, 1970 Today's Date: 12/29/2016 Time: 3151-7616 SLP Time Calculation (min) (ACUTE ONLY): 18 min  Assessment / Plan / Recommendation Clinical Impression  SLP provided Min cues for recall of swallowing strategies from MBS on previous date, as well as Min cues for implementation during PO trials. No overt s/s of aspiration were observed and he subjectively reports feeling like his swallowing is improving. SLP also provided education about speech intelligibility strategies and gave Min-Mod cues for use throughout conversation.    HPI HPI: [name] [name] a 46 y.o.malewho was last seen well 6:30 PM which time he began experiencing slurred speech, difficult walking, facial droop. He has a history of diabetes and hypertension and smokes 2-3 cigarettes per day. After arrival, he was taken for a stat head CT which was negative, was within the time window for TPA he received       SLP Plan  Continue with current plan of care       Recommendations  Diet recommendations: Dysphagia 3 (mechanical soft);Nectar-thick liquid Liquids provided via: Cup Medication Administration: Whole meds with puree Supervision: Staff to assist with self feeding (set up assist) Compensations: Slow rate;Small sips/bites;Multiple dry swallows after each bite/sip Postural Changes and/or Swallow Maneuvers: Seated upright 90 degrees                Oral Care Recommendations: Oral care BID Follow up Recommendations: Inpatient Rehab SLP Visit Diagnosis: Dysphagia, oropharyngeal phase (R13.12);Dysarthria and anarthria (R47.1) Plan: Continue with current plan of care       GO                Maxcine Ham 12/29/2016, 3:31 PM  Maxcine Ham, M.A. CCC-SLP [phone]

## 2016-12-29 NOTE — Progress Notes (Signed)
CM has called 3 times and left 2 messages for patients CSW at [facility], Ms Derrell Lolling, without a return call. CM has also left message with Morrie Sheldon CSW at [facility] for a return call. Plan is for SNF rehab when patient is medically ready for d/c. CSW updated. CM and CSW continuing to follow for d/c disposition.

## 2016-12-29 NOTE — Progress Notes (Signed)
STROKE TEAM PROGRESS NOTE   HISTORY OF PRESENT ILLNESS (per record) [name] [name] is a 46 y.o. male with a history of DM2, HTN, and current tobacco abuse (2-3 cigarettes per day), LSN 6:30 PM on 12/25/2016, who presented with slurred speech, difficulty walking, L-sided weakness, and L-sided facial droop.  After arrival, he was taken for a stat head CT, which was negative.  He was within the time window for tPA, which, after some difficulty controlling his blood pressure, was administered at 2027 on 12/25/2016.  MRI brain with 9 mm acute infarct in the posterior limb left internal capsule.  LKW: 6:30 PM on 12/25/2016 Modified Rankin Score: 1  Patient was administered IV t-PA at [address]217 on 12/25/2016. He was admitted to the neuro ICU for further evaluation and treatment.   SUBJECTIVE (INTERVAL HISTORY) His RN is at the bedside.  Pt is improving but remains dysarthric.   He  Is awaiting DC to SNF at [facility] OBJECTIVE Temp:  [98 F (36.7 C)-99 F (37.2 C)] 99 F (37.2 C) (09/07 2109) Pulse Rate:  [69-79] 79 (09/07 2109) Cardiac Rhythm: Normal sinus rhythm;Bundle branch block (09/07 2109) Resp:  [16-18] 18 (09/07 2109) BP: (121-154)/(88-97) 121/89 (09/07 2109) SpO2:  [94 %-99 %] 99 % (09/07 2109)  CBC:   Recent Labs Lab 12/25/16 1902 12/25/16 1922 12/27/16 0236  WBC 9.4  --  10.7*  NEUTROABS 6.2  --   --   HGB 15.6 15.6 14.7  HCT 42.7 46.0 41.5  MCV 81.8  --  83.5  PLT 249  --  271    Basic Metabolic Panel:   Recent Labs Lab 12/25/16 1902 12/25/16 1922 12/26/16 0226  NA 135 134* 136  K 3.2* 4.3 3.5  CL 97* 97* 100*  CO2 25  --  23  GLUCOSE 398* 403* 363*  BUN CREATININE 1.02 0.80 1.02  CALCIUM 9.9  --  9.9    Lipid Panel:     Component Value Date/Time   CHOL 222 (H) 12/26/2016 0226   TRIG 439 (H) 12/26/2016 0226   HDL 39 (L) 12/26/2016 0226   CHOLHDL 5.7 12/26/2016 0226   VLDL UNABLE TO CALCULATE IF TRIGLYCERIDE OVER 400 mg/dL 78/29/5621 3086   LDLCALC  UNABLE TO CALCULATE IF TRIGLYCERIDE OVER 400 mg/dL 57/84/6962 9528   UXLK4M:  Lab Results  Component Value Date   HGBA1C 12.3 (H) 12/26/2016   Urine Drug Screen:     Component Value Date/Time   LABOPIA NONE DETECTED 12/25/2016 1949   COCAINSCRNUR NONE DETECTED 12/25/2016 1949   COCAINSCRNUR NEGATIVE 03/09/2013 1841   LABBENZ NONE DETECTED 12/25/2016 1949   AMPHETMU NONE DETECTED 12/25/2016 1949   THCU NONE DETECTED 12/25/2016 1949   LABBARB NONE DETECTED 12/25/2016 1949    Alcohol Level     Component Value Date/Time   ETH <5 12/25/2016 1902    IMAGING  Ct Angio Head W Or Wo Contrast Ct Angio Neck W And/or Wo Contrast 12/25/2016 IMPRESSION: 1. Negative for emergent large vessel occlusion but positive for severe stenosis of the right PCA P1 and P2 segments. 2. No arterial occlusion identified. Mild or up to moderate irregularity of other circle of Willis branches, including the left PCA and left MCA. 3. No Basilar Artery or extracranial atherosclerosis or stenosis. 4. No acute findings in the neck.  Prior ACDF.   Ct Head Wo Contrast 12/25/2016 IMPRESSION: 1. No acute intracranial abnormality identified. 2. Stable advanced chronic microvascular ischemic changes of the brain  for age with multiple chronic lacunar infarcts in basal ganglia and the pons.   Ct Head Code Stroke Wo Contrast 12/25/2016 IMPRESSION: 1. No acute cortically based infarct or acute intracranial hemorrhage identified. 2. ASPECTS is 10. 3. Markedly advanced cerebral volume loss and new evidence of bilateral small vessel ischemia since a prior head CT in 2012. 4.  MRI Brain Wo Contrast 12/26/2016 IMPRESSION: 1. 9 mm acute infarct in the posterior limb left internal capsule. 2. Chronic small vessel ischemia with multiple remote lacunar infarcts, extensive for age.  TTE 12/26/2016  Left ventricle: The cavity size was normal. There was mild   concentric hypertrophy. Systolic function was normal. The   estimated  ejection fraction was in the range of 50% to 55%. Wall   motion was normal; there were no regional wall motion   abnormalities     PHYSICAL EXAM Pleasant middle-age Caucasian male currently not in distress. . Afebrile. Head is nontraumatic. Neck is supple without bruit.    Cardiac exam no murmur or gallop. Lungs are clear to auscultation. Distal pulses are well felt. Blood pressure (!) 149/99, pulse 94, temperature 98.1 F (36.7 C), temperature source Axillary, resp. rate 16, height  (1.753 m), weight 182 lb 5.1 oz (82.7 kg), SpO2 97 %. Neurological exam ;  Awake alert oriented 3. Severe dysarthria but can be understood. No aphasia. Extraocular moments are full range but he has saccadic dysmetria in either direction. Mild right lower facial weakness. Tongue midline. Weak cough and gag. Motor system exam no upper or lower extremity drift but mild weakness of right grip and intrinsic hand muscles. Fine finger movements are diminished on the right. Orbits left over right apex daily. Symmetric lower extremity strength. Deep tendon reflexes are symmetric. Sensation intact. Plantars downgoing. Gait not tested. ASSESSMENT/PLAN [name] is a 46 y.o. male with history of DM2, HTN, and current tobacco abuse (2-3 cigarettes per day), LSN 6:30 PM on 12/25/2016, who presented with slurred speech, difficulty walking, L-sided weakness, and L-sided facial droop. He received IV t-PA at 2027 on 12/25/2016.   Stroke: 9 mm acute infarct in the posterior limb left internal capsule in the setting of advanced small vessel disease, multiple remote lacunar infarcts, and poorly controlled diabetes and hyperlipidemia.  Resultant  Dysarthria and right face weakness  CT head: no acute stroke  MRI head: . 9 mm acute infarct in the posterior limb left internal capsule  MRA head: not performed  CTA head/neck: Severe R P1 and P2 segment stenosis 2D Echo  Left ventricle: The cavity size was normal. There was  mild   concentric hypertrophy. Systolic function was normal. The   estimated ejection fraction was in the range of 50% to 55%. Wall   motion was normal; there were no regional wall motion    abnormalities   LDL > 400  HgbA1c 12.3  SCDs for VTE prophylaxis DIET DYS 3 Room service appropriate? Yes; Fluid consistency: Nectar Thick  aspirin 81 mg daily prior to admission, now on No antithrombotic  Patient counseled to be compliant with his antithrombotic medications  Ongoing aggressive stroke risk factor management  Therapy recommendations: SNF Disposition:  SNF Hypertension  Unstable  Clevidipine gtt for initial control, discontinued last night  Permissive hypertension (OK if < 220/120) but gradually normalize in 5-7 days  Long-term BP goal normotensive  Hyperlipidemia  Home meds: atorvastatin 80 mg PO, resumed in hospital  Pt not taking statin  LDL > 400, goal < 70  Continue statin at discharge  Diabetes  HgbA1c 12.3, goal < 7.0  Uncontrolled  Home regimen of lantus 50 units QD, 20 units lantus QHS, and lispro 20 units TID with meals  Pt no taking full prescribed dose of home lantus  Diabetes Coordinator consulted  Other Stroke Risk Factors  Cigarette smoker, advised to stop smoking  Other Active Problems  ?UTI: rare bacteria in UA, no squamous cells  Hospital day # 4  Await SNF discharge to [place] in [place] next week.  Delia Heady, MD Medical Director [facility] Pager: [phone] 12/29/2016 9:58 PM   To contact Stroke Continuity provider, please refer to [email or web address]. After hours, contact General Neurology

## 2016-12-29 NOTE — Progress Notes (Signed)
Inpatient Diabetes Program Recommendations  AACE/ADA: New Consensus Statement on Inpatient Glycemic Control (2015)  Target Ranges:  Prepandial:   less than 140 mg/dL      Peak postprandial:   less than 180 mg/dL (1-2 hours)      Critically ill patients:  140 - 180 mg/dL   Review of Glycemic Control  Diabetes history:DM 2 Outpatient Diabetes medications: Lantus 20 units QHS, Humalog 20 units tid with meals Current orders for Inpatient glycemic control: Lantus 20 units, Novolog Moderate Correction 0-15 units Q4 hours  Inpatient Diabetes Program Recommendations:   Glucose in 200's. Consider increasing Lantus to 22 units Daily. PO intake at 50% or lower. Consider increasing Novolog Correction scale to Novolog Resistant 0-20 units tid + Novolog 0-5 units QHS scale.  Thanks,  Christena Deem RN, MSN, [facility] Inpatient Diabetes Coordinator Team Pager [phone] (8a-5p)

## 2016-12-29 NOTE — Progress Notes (Signed)
Physical Therapy Treatment Patient Details Name: [name] [name] MRN: [id number] DOB: 08-15-1970 Today's Date: 12/29/2016    History of Present Illness Pt is a 46 y/o male w/ h/o previous stroke admitted with new onset of symptoms involving his dominant right side. MRI revealed 71mm acute infarct in posterior limb left internal capsule. PMH includes: CVA, HTN, hyperlipidemia, DM, smoker, UTI.    PT Comments    Pt performed increased activity but limited to bed level exercises and balance training edge of bed.  Pt with R lateral and posterior lean in sitting.  Pt required assist with trunk control throughout and weight bearing through RLE.  RLE in sitting posture toward midline.     Follow Up Recommendations  CIR     Equipment Recommendations  Wheelchair (measurements PT);Wheelchair cushion (measurements PT)    Recommendations for Other Services Rehab consult     Precautions / Restrictions Precautions Precautions: Fall Precaution Comments: right sided weakness Restrictions Weight Bearing Restrictions: No    Mobility  Bed Mobility Overal bed mobility: Needs Assistance Bed Mobility: Rolling;Sidelying to Sit;Sit to Sidelying Rolling: Mod assist Sidelying to sit: Mod assist;HOB elevated Supine to sit: Max assist;HOB elevated     General bed mobility comments: Mod assist to support trunk, pt using left arm to pull to his side, assist at legs and to support trunk to transition to sitting.  Assist with bed pad to weight shift hips to scoot to EOB.   Pt with posterior and lateral lean to R.  Pt required assist to ground B LEs in sitting with cues for trunk control and support.  Multiple LOB noted in sitting.  Focus on righting and weight shifting in sitting.    Transfers Overall transfer level:  (deferred OOB as trunk remains weak and patient began to fatigue as tx progressed.  )                  Ambulation/Gait                 Stairs            Wheelchair  Mobility    Modified Rankin (Stroke Patients Only) Modified Rankin (Stroke Patients Only) Pre-Morbid Rankin Score: No significant disability Modified Rankin: Severe disability     Balance Overall balance assessment: Needs assistance   Sitting balance-Leahy Scale: Poor Sitting balance - Comments: Mod assist to help pt maintain midline balance EOB.  Pt able to report he is leaning right, but only able to correct with left upper extremity pull.  Practiced reaching to the left and forward to counteract his right posterior push/lean.  Pt also performed LUE elbow propping to exxagerate weight shifting to the L.  Constant cueing for forward weight shifting and head control.       Standing balance-Leahy Scale: Zero                              Cognition Arousal/Alertness: Awake/alert Behavior During Therapy: WFL for tasks assessed/performed Overall Cognitive Status: Within Functional Limits for tasks assessed                                 General Comments: Not specifically tested, but aware of his right lean, knows he cannot get up on his own, and was able to report history questions accurately.        Exercises Low Level/ICU Exercises  Hip ABduction/ADduction: Right;10 reps;Supine;AAROM (via clam shell.  ) Heel Slides: AAROM;Right;10 reps;Supine Stabilized Bridging: AROM;Both;10 reps;Supine;AAROM (assist with RLE during bridging to maintain hook lying position.  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 : Unable Difficulty sitting down on and standing up from a chair with arms (e.g., wheelchair, bedside commode, etc,.)?: Unable Help needed moving to and from a bed to chair (including a wheelchair)?: A Lot Help needed walking in hospital room?: Total Help needed climbing 3-5 steps with a railing? : Total 6 Click Score: 7    End of Session         PT Visit Diagnosis: Difficulty in walking, not elsewhere classified (R26.2);Hemiplegia and hemiparesis Hemiplegia - Right/Left: Right Hemiplegia - dominant/non-dominant: Dominant Hemiplegia - caused by: Cerebral infarction     Time: 1547-1610 PT Time Calculation (min) (ACUTE ONLY): 23 min  Charges:  $Therapeutic Exercise: 8-22 mins $Therapeutic Activity: 8-22 mins                    G Codes:       Joycelyn Rua, PTA pager [phone]    Florestine Avers 12/29/2016, 4:19 PM

## 2016-12-29 NOTE — Telephone Encounter (Signed)
Attempted to call patient to get loop recorder serial number. Automated message stating that call was rejected. Will try again next week.

## 2016-12-30 DIAGNOSIS — E782 Mixed hyperlipidemia: Secondary | ICD-10-CM | POA: Diagnosis not present

## 2016-12-30 DIAGNOSIS — F17200 Nicotine dependence, unspecified, uncomplicated: Secondary | ICD-10-CM | POA: Diagnosis not present

## 2016-12-30 DIAGNOSIS — E1159 Type 2 diabetes mellitus with other circulatory complications: Secondary | ICD-10-CM

## 2016-12-30 DIAGNOSIS — I10 Essential (primary) hypertension: Secondary | ICD-10-CM | POA: Diagnosis not present

## 2016-12-30 DIAGNOSIS — E876 Hypokalemia: Secondary | ICD-10-CM | POA: Diagnosis not present

## 2016-12-30 DIAGNOSIS — I63312 Cerebral infarction due to thrombosis of left middle cerebral artery: Secondary | ICD-10-CM

## 2016-12-30 DIAGNOSIS — Z794 Long term (current) use of insulin: Secondary | ICD-10-CM | POA: Diagnosis not present

## 2016-12-30 DIAGNOSIS — E785 Hyperlipidemia, unspecified: Secondary | ICD-10-CM | POA: Diagnosis not present

## 2016-12-30 LAB — GLUCOSE, CAPILLARY
GLUCOSE-CAPILLARY: 181 mg/dL — AB (ref 65–99)
GLUCOSE-CAPILLARY: 272 mg/dL — AB (ref 65–99)
Glucose-Capillary: 124 mg/dL — ABNORMAL HIGH (ref 65–99)
Glucose-Capillary: 116 mg/dL — ABNORMAL HIGH (ref 65–99)
Glucose-Capillary: 279 mg/dL — ABNORMAL HIGH (ref 65–99)
Glucose-Capillary: 219 mg/dL — ABNORMAL HIGH (ref 65–99)

## 2016-12-30 NOTE — Progress Notes (Signed)
STROKE TEAM PROGRESS NOTE   SUBJECTIVE (INTERVAL HISTORY) His RN is at the bedside.  Pt is improving but remains dysarthric, right hemiparesis. He is awaiting DC to SNF at [facility].   OBJECTIVE Temp:  [98.1 F (36.7 C)-99 F (37.2 C)] 98.1 F (36.7 C) (09/08 1006) Pulse Rate:  [57-79] 59 (09/08 1006) Cardiac Rhythm: Sinus bradycardia;Heart block (09/08 0700) Resp:  [18-20] 20 (09/08 1006) BP: (117-134)/(62-89) 117/62 (09/08 1006) SpO2:  [97 %-99 %] 97 % (09/08 1006)  CBC:   Recent Labs Lab 12/25/16 1902 12/25/16 1922 12/27/16 0236  WBC 9.4  --  10.7*  NEUTROABS 6.2  --   --   HGB 15.6 15.6 14.7  HCT 42.7 46.0 41.5  MCV 81.8  --  83.5  PLT 249  --  271    Basic Metabolic Panel:   Recent Labs Lab 12/25/16 1902 12/25/16 1922 12/26/16 0226  NA 135 134* 136  K 3.2* 4.3 3.5  CL 97* 97* 100*  CO2 25  --  23  GLUCOSE 398* 403* 363*  BUN CREATININE 1.02 0.80 1.02  CALCIUM 9.9  --  9.9    Lipid Panel:     Component Value Date/Time   CHOL 222 (H) 12/26/2016 0226   TRIG 439 (H) 12/26/2016 0226   HDL 39 (L) 12/26/2016 0226   CHOLHDL 5.7 12/26/2016 0226   VLDL UNABLE TO CALCULATE IF TRIGLYCERIDE OVER 400 mg/dL 16/01/9603 5409   LDLCALC UNABLE TO CALCULATE IF TRIGLYCERIDE OVER 400 mg/dL 81/19/1478 2956   OZHY8M:  Lab Results  Component Value Date   HGBA1C 12.3 (H) 12/26/2016   Urine Drug Screen:     Component Value Date/Time   LABOPIA NONE DETECTED 12/25/2016 1949   COCAINSCRNUR NONE DETECTED 12/25/2016 1949   COCAINSCRNUR NEGATIVE 03/09/2013 1841   LABBENZ NONE DETECTED 12/25/2016 1949   AMPHETMU NONE DETECTED 12/25/2016 1949   THCU NONE DETECTED 12/25/2016 1949   LABBARB NONE DETECTED 12/25/2016 1949    Alcohol Level     Component Value Date/Time   ETH <5 12/25/2016 1902    IMAGING I have personally reviewed the radiological images below and agree with the radiology interpretations.  Ct Angio Head W Or Wo Contrast Ct Angio Neck W  And/or Wo Contrast 12/25/2016 IMPRESSION:  1. Negative for emergent large vessel occlusion but positive for severe stenosis of the right PCA P1 and P2 segments.  2. No arterial occlusion identified. Mild or up to moderate irregularity of other circle of Willis branches, including the left PCA and left MCA.  3. No Basilar Artery or extracranial atherosclerosis or stenosis.  4. No acute findings in the neck.  Prior ACDF.   Ct Head Wo Contrast 12/25/2016 IMPRESSION:  1. No acute intracranial abnormality identified.  2. Stable advanced chronic microvascular ischemic changes of the brain for age with multiple chronic lacunar infarcts in basal ganglia and the pons.   12/25/2016 IMPRESSION:  1. No acute cortically based infarct or acute intracranial hemorrhage identified.  2. ASPECTS is 10.  3. Markedly advanced cerebral volume loss and new evidence of bilateral small vessel ischemia since a prior head CT in 2012. 4.  MRI Brain Wo Contrast 12/26/2016 IMPRESSION: 1. 9 mm acute infarct in the posterior limb left internal capsule. 2. Chronic small vessel ischemia with multiple remote lacunar infarcts, extensive for age.  TTE 12/26/2016  Left ventricle: The cavity size was normal. There was mild   concentric hypertrophy. Systolic function was normal. The  estimated ejection fraction was in the range of 50% to 55%. Wall   motion was normal; there were no regional wall motion   abnormalities     PHYSICAL EXAM Vitals:   12/29/16 2109 12/30/16 0054 12/30/16 0457 12/30/16 1006  BP: 121/89 130/74 134/70 117/62  Pulse: 79 64 (!) 57 (!) 59  Resp: 18 18 18 20   Temp: 99 F (37.2 C) 98.4 F (36.9 C) 98.2 F (36.8 C) 98.1 F (36.7 C)  TempSrc: Oral Oral Oral Oral  SpO2: 99% 98% 97% 97%  Weight:      Height:        Pleasant middle-age Caucasian male currently not in distress. . Afebrile. Head is nontraumatic. Neck is supple without bruit.    Cardiac exam no murmur or gallop. Lungs are clear to  auscultation. Distal pulses are well felt. Blood pressure (!) 149/99, pulse 94, temperature 98.1 F (36.7 C), temperature source Axillary, resp. rate 16, height 5\' 9"  (1.753 m), weight 182 lb 5.1 oz (82.7 kg), SpO2 97 %. Neurological exam ;  Awake alert oriented 3. Moderate dysarthria. No aphasia. Extraocular moments are full range but he has saccadic dysmetria in either direction. Right lower facial weakness. Tongue midline. Motor system exam right UE 2-/5 with increase muscle tone, RLE 3+/5 with increased tone. LUE and LLE 5/5. Deep tendon reflexes are 3+ on the right. Right babinski positive. Sensation intact. Left FTN intact. Gait not tested.   ASSESSMENT/PLAN [name] [name] is a 46 y.o. male with history of DM2, HTN, previous stroke, and current tobacco abuse (2-3 cigarettes per day), LSN 6:30 PM on 12/25/2016, who presented with slurred speech, difficulty walking, L-sided weakness, and L-sided facial droop. He received IV t-PA at 2027 on 12/25/2016.   Stroke: acute infarct at left PLIC, likely small vessel disease due to poorly controlled diabetes and hyperlipidemia.  Resultant  Dysarthria and right facial droop and right hemiparesis  CT head: no acute stroke  MRI head: . 9 mm acute infarct in the posterior limb left internal capsule  CTA head/neck: Severe R P1 and P2 segment stenosis  2D Echo  -  EF 50% to 55%. No cardiac source of emboli identified.  LDL NTC with TG > 400  HgbA1c 12.3  SCDs for VTE prophylaxis DIET DYS 3 Room service appropriate? Yes; Fluid consistency: Nectar Thick  aspirin 81 mg daily prior to admission, now on plavix 75mg  daily.  Patient counseled to be compliant with his antithrombotic medications  Ongoing aggressive stroke risk factor management  Therapy recommendations: SNF in [place]  Disposition:  pending  Hypertension  stable  On norvasc, atenolol, chlorthalidone, and lisinopril  Long-term BP goal normotensive  Hyperlipidemia  Home  meds: atorvastatin 80 mg PO, resumed in hospital  Pt was not taking his statin medication at home.  LDL NTC with TG > 400, goal < 70  Continue statin at discharge  Diabetes  HgbA1c 12.3, goal < 7.0  Uncontrolled  Home regimen of lantus 50 units QD, 20 units lantus QHS, and lispro 20 units TID with meals  Pt no taking full prescribed dose of home lantus  On lantus now  SSI  Diabetes Coordinator consulted  Tobacco abuse  Current smoker  Smoking cessation counseling provided  Pt is willing to quit  Other Stroke Risk Factors  noncompliance  Other Active Problems    Hospital day # 5  Marvel Plan, MD PhD Stroke Neurology 12/31/2016 12:05 AM  To contact Stroke Continuity provider, please refer  to [email or web address] After hours, contact General Neurology

## 2016-12-31 DIAGNOSIS — Z794 Long term (current) use of insulin: Secondary | ICD-10-CM | POA: Diagnosis not present

## 2016-12-31 DIAGNOSIS — E1159 Type 2 diabetes mellitus with other circulatory complications: Secondary | ICD-10-CM | POA: Diagnosis not present

## 2016-12-31 DIAGNOSIS — I63312 Cerebral infarction due to thrombosis of left middle cerebral artery: Secondary | ICD-10-CM | POA: Diagnosis not present

## 2016-12-31 DIAGNOSIS — E782 Mixed hyperlipidemia: Secondary | ICD-10-CM

## 2016-12-31 LAB — GLUCOSE, CAPILLARY
GLUCOSE-CAPILLARY: 120 mg/dL — AB (ref 65–99)
GLUCOSE-CAPILLARY: 165 mg/dL — AB (ref 65–99)
GLUCOSE-CAPILLARY: 203 mg/dL — AB (ref 65–99)
GLUCOSE-CAPILLARY: 249 mg/dL — AB (ref 65–99)
Glucose-Capillary: 142 mg/dL — ABNORMAL HIGH (ref 65–99)
Glucose-Capillary: 275 mg/dL — ABNORMAL HIGH (ref 65–99)

## 2016-12-31 LAB — BASIC METABOLIC PANEL
Anion gap: 9 (ref 5–15)
BUN: 10 mg/dL (ref 6–20)
CO2: 28 mmol/L (ref 22–32)
CREATININE: 0.96 mg/dL (ref 0.61–1.24)
Calcium: 9.4 mg/dL (ref 8.9–10.3)
Chloride: 100 mmol/L — ABNORMAL LOW (ref 101–111)
GFR calc Af Amer: >60 mL/min (ref >60)
GLUCOSE: 172 mg/dL — AB (ref 65–99)
Potassium: 3.2 mmol/L — ABNORMAL LOW (ref 3.5–5.1)
SODIUM: 137 mmol/L (ref 135–145)

## 2016-12-31 LAB — CBC
HCT: 42 % (ref 39.0–52.0)
Hemoglobin: 14.7 g/dL (ref 13.0–17.0)
MCH: 29.4 pg (ref 26.0–34.0)
MCHC: 35 g/dL (ref 30.0–36.0)
MCV: 84 fL (ref 78.0–100.0)
PLATELETS: 251 10*3/uL (ref 150–400)
RBC: 5 MIL/uL (ref 4.22–5.81)
RDW: 12.4 % (ref 11.5–15.5)
WBC: 9.7 10*3/uL (ref 4.0–10.5)

## 2016-12-31 MED ORDER — POTASSIUM CHLORIDE CRYS ER 20 MEQ PO TBCR
40.0000 meq | EXTENDED_RELEASE_TABLET | ORAL | Status: AC
  Administered 2016-12-31 (×2): 40 meq via ORAL
  Filled 2016-12-31 (×2): qty 2

## 2016-12-31 NOTE — Progress Notes (Signed)
CSW received call back from patient's sister, Eunice Blase, as she arrived to the hospital to visit patient. Debbie confirmed she had received VA paperwork left at bedside and has completed it; however, forgot to bring it to the hospital today. Patient's sister indicated she can bring the paperwork back tomorrow so that it can be faxed over to [place] office.  CSW to continue to follow for discharge planning.  Blenda Nicely, [place] Clinical Social Worker [phone]

## 2016-12-31 NOTE — Progress Notes (Signed)
Occupational Therapy Treatment Patient Details Name: [name] [name] MRN: [id number] DOB: 02/02/1971 Today's Date: 12/31/2016    History of present illness Pt is a 46 y/o male w/ h/o previous stroke admitted with new onset of symptoms involving his dominant right side. MRI revealed 43mm acute infarct in posterior limb left internal capsule. PMH includes: CVA, HTN, hyperlipidemia, DM, smoker, UTI.   OT comments  When therapist entered room nursing was assisting Pt. Back to bed. Pt. Was complaining of fatigue. Pt. Was agreeable to EOB balance training. Pt. Was leaning to R and required Min a to return to baseline. Pt. Worked on side pushups using L ue and bringing patient to midline. Pt. Was able to perform this task at min guard assist. Pt. Was encouraged to st in midline and to sit bring head into midline position. Pt. Stated he needed to lay down and was assisted with task. Pt. Was able to repostion in bed at [facility] a level. Pt was agreeable to performing neuro re-education of R UE in supine with HOB elevated. Pt. Does not have volitional use of R UE but is having overflow movement. Pt. Will need continued OT services.   Follow Up Recommendations  CIR;SNF    Equipment Recommendations       Recommendations for Other Services      Precautions / Restrictions Precautions Precautions: Fall Precaution Comments: right sided weakness       Mobility Bed Mobility   Bed Mobility: Sit to Supine       Sit to supine: Min assist      Transfers                      Balance       Sitting balance - Comments: Patient was min A to maintain balace while sitting eob.                                    ADL either performed or assessed with clinical judgement   ADL                                               Vision       Perception     Praxis      Cognition Arousal/Alertness: Awake/alert Behavior During Therapy: WFL for tasks  assessed/performed Overall Cognitive Status: Within Functional Limits for tasks assessed                                          Exercises     Shoulder Instructions       General Comments      Pertinent Vitals/ Pain       Pain Assessment: No/denies pain  Home Living                                          Prior Functioning/Environment              Frequency  Min 2X/week        Progress Toward Goals  OT Goals(current goals can now be found in  the care plan section)  Progress towards OT goals: Progressing toward goals  Acute Rehab OT Goals Patient Stated Goal: get stronger  Plan      Co-evaluation                 AM-PAC PT "6 Clicks" Daily Activity     Outcome Measure   Help from another person eating meals?: A Little Help from another person taking care of personal grooming?: A Little Help from another person toileting, which includes using toliet, bedpan, or urinal?: A Lot Help from another person bathing (including washing, rinsing, drying)?: A Lot Help from another person to put on and taking off regular upper body clothing?: A Lot Help from another person to put on and taking off regular lower body clothing?: Total 6 Click Score: 13    End of Session    OT Visit Diagnosis: Unsteadiness on feet (R26.81);Other abnormalities of gait and mobility (R26.89);Hemiplegia and hemiparesis Hemiplegia - Right/Left: Right Hemiplegia - caused by: Cerebral infarction   Activity Tolerance Patient limited by fatigue   Patient Left in bed;with call bell/phone within reach;with bed alarm set;with nursing/sitter in room   Nurse Communication          Time: 0920-0950 OT Time Calculation (min): 30 min  Charges: OT General Charges $OT Visit: 1 Visit OT Treatments $Neuromuscular Re-education: 23-37 mins  6 clicks   Salaya Holtrop 12/31/2016, 10:00 AM

## 2016-12-31 NOTE — Clinical Social Work Note (Signed)
Clinical Social Work Assessment  Patient Details  Name: [name] [name] MRN: [id number] Date of Birth: 12/03/1970  Date of referral:  12/31/16               Reason for consult:  Facility Placement                Permission sought to share information with:  Facility Sport and exercise psychologist, Family Supports Permission granted to share information::  Yes, Verbal Permission Granted  Name::     Community education officer::  [place], SNF  Relationship::  Sister  Contact Information:     Housing/Transportation Living arrangements for the past 2 months:  Single Family Home Source of Information:  Patient Patient Interpreter Needed:  None Criminal Activity/Legal Involvement Pertinent to Current Situation/Hospitalization:  No - Comment as needed Significant Relationships:  Siblings Lives with:  Self, Siblings Do you feel safe going back to the place where you live?  Yes Need for family participation in patient care:  No (Coment)  Care giving concerns:  Patient has been residing with his sister, but currently requires short term rehab to improve mobility and independence before determining if he can return home or need long term care.   Social Worker assessment / plan:  CSW met with patient to discuss discharge planning. CSW discussed with patient that calls had been made to his [place] worker with no response, but that CSW was working towards getting a [place] SNF placement for the patient. CSW confirmed that patient's sister had received the paperwork left in the room the other day, and patient confirmed that she had picked it up but didn't know if she had been able to fill it out yet. Patient gave permission for CSW to call sister. CSW left a voicemail for patient's sister. CSW has completed VA referral and faxed to the [facility] office. CSW left a voicemail for placement coordinator at the [place]. CSW to fax out referrals to VA-contracted SNF and follow up with any bed offers. CSW to follow to facilitate discharge  planning.  Employment status:  Unemployed Forensic scientist:  VA Benefit PT Recommendations:  [place], [place] / Referral to community resources:  [place]  Patient/Family's Response to care:  Patient agreeable to SNF placement.  Patient/Family's Understanding of and Emotional Response to Diagnosis, Current Treatment, and Prognosis:  Patient indicated understanding of discussion, although his responses were delayed. Patient acknowledged that he would need rehabilitation at discharge, and is aware that he needs assistance at this time. Patient appreciative of CSW assistance.  Emotional Assessment Appearance:  Appears stated age Attitude/Demeanor/Rapport:    Affect (typically observed):  Appropriate Orientation:  Oriented to Situation, Oriented to  Time, Oriented to Place, Oriented to Self Alcohol / Substance use:  Not Applicable Psych involvement (Current and /or in the community):  No (Comment)  Discharge Needs  Concerns to be addressed:  Care Coordination Readmission within the last 30 days:  No Current discharge risk:  Physical Impairment Barriers to Discharge:  Continued Medical Work up, Inadequate or no insurance, [place], [place] 12/31/2016, 3:08 PM

## 2016-12-31 NOTE — Progress Notes (Signed)
STROKE TEAM PROGRESS NOTE   SUBJECTIVE (INTERVAL HISTORY) No family is at the bedside.  Pt is lying in bed sleeping. Easily arousable. SW is working for placement.    OBJECTIVE Temp:  [97.7 F (36.5 C)-99 F (37.2 C)] 97.7 F (36.5 C) (09/09 1424) Pulse Rate:  [59-67] 59 (09/09 1424) Cardiac Rhythm: Normal sinus rhythm (09/09 0859) Resp:  [16-20] 16 (09/09 1424) BP: (110-146)/(68-94) 114/72 (09/09 1424) SpO2:  [95 %-98 %] 95 % (09/09 1424)  CBC:   Recent Labs Lab 12/25/16 1902  12/27/16 0236 12/31/16 0444  WBC 9.4  --  10.7* 9.7  NEUTROABS 6.2  --   --   --   HGB 15.6  < > 14.7 14.7  HCT 42.7  < > 41.5 42.0  MCV 81.8  --  83.5 84.0  PLT 249  --  271 251  < > = values in this interval not displayed.  Basic Metabolic Panel:   Recent Labs Lab 12/26/16 0226 12/31/16 0444  NA 136 137  K 3.5 3.2*  CL 100* 100*  CO2 23 28  GLUCOSE 363* 172*  BUN 10 10  CREATININE 1.02 0.96  CALCIUM 9.9 9.4    Lipid Panel:     Component Value Date/Time   CHOL 222 (H) 12/26/2016 0226   TRIG 439 (H) 12/26/2016 0226   HDL 39 (L) 12/26/2016 0226   CHOLHDL 5.7 12/26/2016 0226   VLDL UNABLE TO CALCULATE IF TRIGLYCERIDE OVER 400 mg/dL 54/12/8117 1478   LDLCALC UNABLE TO CALCULATE IF TRIGLYCERIDE OVER 400 mg/dL 29/56/2130 8657   QION6E:  Lab Results  Component Value Date   HGBA1C 12.3 (H) 12/26/2016   Urine Drug Screen:     Component Value Date/Time   LABOPIA NONE DETECTED 12/25/2016 1949   COCAINSCRNUR NONE DETECTED 12/25/2016 1949   COCAINSCRNUR NEGATIVE 03/09/2013 1841   LABBENZ NONE DETECTED 12/25/2016 1949   AMPHETMU NONE DETECTED 12/25/2016 1949   THCU NONE DETECTED 12/25/2016 1949   LABBARB NONE DETECTED 12/25/2016 1949    Alcohol Level     Component Value Date/Time   ETH <5 12/25/2016 1902    IMAGING I have personally reviewed the radiological images below and agree with the radiology interpretations.  Ct Angio Head W Or Wo Contrast Ct Angio Neck W And/or Wo  Contrast 12/25/2016 IMPRESSION:  1. Negative for emergent large vessel occlusion but positive for severe stenosis of the right PCA P1 and P2 segments.  2. No arterial occlusion identified. Mild or up to moderate irregularity of other circle of Willis branches, including the left PCA and left MCA.  3. No Basilar Artery or extracranial atherosclerosis or stenosis.  4. No acute findings in the neck.  Prior ACDF.   Ct Head Wo Contrast 12/25/2016 IMPRESSION:  1. No acute intracranial abnormality identified.  2. Stable advanced chronic microvascular ischemic changes of the brain for age with multiple chronic lacunar infarcts in basal ganglia and the pons.   12/25/2016 IMPRESSION:  1. No acute cortically based infarct or acute intracranial hemorrhage identified.  2. ASPECTS is 10.  3. Markedly advanced cerebral volume loss and new evidence of bilateral small vessel ischemia since a prior head CT in 2012. 4.  MRI Brain Wo Contrast 12/26/2016 IMPRESSION: 1. 9 mm acute infarct in the posterior limb left internal capsule. 2. Chronic small vessel ischemia with multiple remote lacunar infarcts, extensive for age.  TTE 12/26/2016  Left ventricle: The cavity size was normal. There was mild   concentric hypertrophy. Systolic function  was normal. The   estimated ejection fraction was in the range of 50% to 55%. Wall   motion was normal; there were no regional wall motion   abnormalities     PHYSICAL EXAM Vitals:   12/31/16 0123 12/31/16 0542 12/31/16 0859 12/31/16 1424  BP: 116/72 110/68 114/74 114/72  Pulse: 63 66 67 (!) 59  Resp: Temp: 98.9 F (37.2 C) 98.9 F (37.2 C) 98.9 F (37.2 C) 97.7 F (36.5 C)  TempSrc: Oral Oral Oral Oral  SpO2: 97% 98% 98% 95%  Weight:      Height:        Pleasant middle-age Caucasian male currently not in distress. . Afebrile. Head is nontraumatic. Neck is supple without bruit.    Cardiac exam no murmur or gallop. Lungs are clear to auscultation.  Distal pulses are well felt. Blood pressure (!) 149/99, pulse 94, temperature 98.1 F (36.7 C), temperature source Axillary, resp. rate 16, height  (1.753 m), weight 182 lb 5.1 oz (82.7 kg), SpO2 97 %. Neurological exam ;  Awake alert oriented 3. Moderate dysarthria. No aphasia. Extraocular moments are full range but he has saccadic dysmetria in either direction. Right lower facial weakness. Tongue midline. Motor system exam right UE 2-/5 with increase muscle tone, RLE 3+/5 with increased tone. LUE and LLE 5/5. Deep tendon reflexes are 3+ on the right. Right babinski positive. Sensation intact. Left FTN intact. Gait not tested.   ASSESSMENT/PLAN [name] is a 47 y.o. male with history of DM2, HTN, previous stroke, and current tobacco abuse (2-3 cigarettes per day), LSN 6:30 PM on 12/25/2016, who presented with slurred speech, difficulty walking, L-sided weakness, and L-sided facial droop. He received IV t-PA at 2027 on 12/25/2016.   Stroke: acute infarct at left PLIC, likely small vessel disease due to poorly controlled diabetes and hyperlipidemia.  Resultant  Dysarthria and right facial droop and right hemiparesis  CT head: no acute stroke  MRI head: . 9 mm acute infarct in the posterior limb left internal capsule  CTA head/neck: Severe R P1 and P2 segment stenosis  2D Echo  -  EF 50% to 55%. No cardiac source of emboli identified.  LDL NTC with TG > 400  HgbA1c 12.3  SCDs for VTE prophylaxis DIET DYS 3 Room service appropriate? Yes; Fluid consistency: Nectar Thick  aspirin 81 mg daily prior to admission, now on plavix  daily.  Patient counseled to be compliant with his antithrombotic medications  Ongoing aggressive stroke risk factor management  Therapy recommendations: SNF in [place]  Disposition:  pending  Hypertension  stable  On norvasc, atenolol, chlorthalidone, and lisinopril  Long-term BP goal normotensive  Hyperlipidemia  Home meds:  atorvastatin 80 mg PO, resumed in hospital  Pt was not taking his statin medication at home.  LDL NTC with TG > 400, goal < 70  Continue statin at discharge  Diabetes  HgbA1c 12.3, goal < 7.0  Uncontrolled  Home regimen of lantus 50 units QD, 20 units lantus QHS, and lispro 20 units TID with meals  Pt no taking full prescribed dose of home lantus  On lantus now  SSI  Diabetes Coordinator consulted  Tobacco abuse  Current smoker  Smoking cessation counseling provided  Pt is willing to quit  Other Stroke Risk Factors  noncompliance  Other Active Problems  Hypokalemia - supplementesd - recheck in a.m.   Hospital day # 6  Marvel Plan, MD PhD Stroke Neurology  12/31/2016 6:37 PM   To contact Stroke Continuity provider, please refer to [email or web address]. After hours, contact General Neurology

## 2016-12-31 NOTE — Progress Notes (Signed)
Physical Therapy Treatment Patient Details Name: [name] [name] MRN: [id number] DOB: April 05, 1971 Today's Date: 12/31/2016    History of Present Illness Pt is a 46 y/o male w/ h/o previous stroke admitted with new onset of symptoms involving his dominant right side. MRI revealed 13mm acute infarct in posterior limb left internal capsule. PMH includes: CVA, HTN, hyperlipidemia, DM, smoker, UTI.    PT Comments    Continuing work on functional mobility and activity tolerance;  Noted improvements in bed mobility and transfers, and pt recognized he was moving better today than previous sessions; Session focused on transfers and getting up to standing, and to work toward better midline orientation; Good use of stedy to get up; Will continue to follow;   Noted plan for rehab at SNF at the [facility] in [facility]  Follow Up Recommendations  SNF     Equipment Recommendations  Wheelchair (measurements PT);Wheelchair cushion (measurements PT)    Recommendations for Other Services Rehab consult     Precautions / Restrictions Precautions Precautions: Fall Precaution Comments: right sided weakness; Pusher syndrome    Mobility  Bed Mobility Overal bed mobility: Needs Assistance Bed Mobility: Rolling;Sidelying to Sit Rolling: Mod assist Sidelying to sit: Mod assist;HOB elevated   Sit to supine: Min assist   General bed mobility comments: Mod assist to support trunk, pt using left arm to pull to his side, assist at legs and to support trunk to transition to sitting.  Assist with bed pad to weight shift hips to scoot to EOB.   Pt with posterior and lateral lean to R.  Pt required assist to ground B LEs in sitting with cues for trunk control and support.   Focus on righting and weight shifting in sitting.    Transfers Overall transfer level: Needs assistance Equipment used: 2 person hand held assist Transfers: Sit to/from Stand Sit to Stand: +2 physical assistance;Mod assist         General transfer  comment: 2 person assist and use of bed pad to assist in powering up; Stood to [place], and used L hand to help with pulling up to stand; multimodal cues to initiate with anterior weight shift; Heavy mod assist, but food effort  Ambulation/Gait                 Stairs            Wheelchair Mobility    Modified Rankin (Stroke Patients Only) Modified Rankin (Stroke Patients Only) Pre-Morbid Rankin Score: No significant disability Modified Rankin: Severe disability     Balance Overall balance assessment: Needs assistance Sitting-balance support: Feet supported;Single extremity supported Sitting balance-Leahy Scale:  (approaching Fair) Sitting balance - Comments: Noting tendency to use LUE to push trunk towards R; worked on L lean, L reaching, L elbow prop Postural control: Right lateral lean;Posterior lean                                  Cognition Arousal/Alertness: Awake/alert Behavior During Therapy: WFL for tasks assessed/performed Overall Cognitive Status: Within Functional Limits for tasks assessed                                 General Comments: Not specifically tested, but aware of his right lean, knows he cannot get up on his own, and was able to report history questions accurately.  Exercises      General Comments        Pertinent Vitals/Pain Pain Assessment: No/denies pain Faces Pain Scale: No hurt Pain Intervention(s): Monitored during session    Home Living                      Prior Function            PT Goals (current goals can now be found in the care plan section) Acute Rehab PT Goals Patient Stated Goal: get stronger PT Goal Formulation: With patient Time For Goal Achievement: 01/11/17 Potential to Achieve Goals: Good Progress towards PT goals: Progressing toward goals    Frequency    Min 4X/week      PT Plan Discharge plan needs to be updated    Co-evaluation               AM-PAC PT "6 Clicks" Daily Activity  Outcome Measure  Difficulty turning over in bed (including adjusting bedclothes, sheets and blankets)?: Unable Difficulty moving from lying on back to sitting on the side of the bed? : Unable Difficulty sitting down on and standing up from a chair with arms (e.g., wheelchair, bedside commode, etc,.)?: Unable Help needed moving to and from a bed to chair (including a wheelchair)?: A Lot Help needed walking in hospital room?: A Lot Help needed climbing 3-5 steps with a railing? : Total 6 Click Score: 8    End of Session Equipment Utilized During Treatment: Gait belt Activity Tolerance: Patient tolerated treatment well Patient left: in chair;with call bell/phone within reach;with chair alarm set Nurse Communication: Mobility status;Need for lift equipment PT Visit Diagnosis: Difficulty in walking, not elsewhere classified (R26.2);Hemiplegia and hemiparesis Hemiplegia - Right/Left: Right Hemiplegia - dominant/non-dominant: Dominant Hemiplegia - caused by: Cerebral infarction     Time: 1610-9604 PT Time Calculation (min) (ACUTE ONLY): 25 min  Charges:  $Therapeutic Activity: 23-37 mins                    G Codes:       Van Clines, PT  Acute Rehabilitation Services Pager [phone] Office [phone]    Levi Aland 12/31/2016, 11:55 AM

## 2016-12-31 NOTE — NC FL2 (Signed)
MEDICAID FL2 LEVEL OF CARE SCREENING TOOL     IDENTIFICATION  Patient Name: [name] [name] Birthdate: February 12, 1971 Sex: male Admission Date (Current Location): 12/25/2016  [facility] and [place] Number:  Chiropodist and Address:  The [place], [address] [place], [place] [address]      Provider Number: [id number]  Attending Physician Name and Address:  Marvel Plan, MD  Relative Name and Phone Number:       Current Level of Care: Hospital Recommended Level of Care: Skilled Nursing Facility Prior Approval Number:    Date Approved/Denied:   PASRR Number: [id number]  Discharge Plan: SNF    Current Diagnoses: Patient Active Problem List   Diagnosis Date Noted  . Stroke (HCC) 12/25/2016  . Stroke (cerebrum) (HCC) 12/25/2016    Orientation RESPIRATION BLADDER Height & Weight     Self, Time, Situation, Place    Incontinent, External catheter Weight: 182 lb 5.1 oz (82.7 kg) Height:  5\' 9"  (175.3 cm)  BEHAVIORAL SYMPTOMS/MOOD NEUROLOGICAL BOWEL NUTRITION STATUS       (unknown) Diet (mechanical soft; nectar thick liquids)  AMBULATORY STATUS COMMUNICATION OF NEEDS Skin   Extensive Assist Verbally Normal                       Personal Care Assistance Level of Assistance  Bathing, Feeding, Dressing Bathing Assistance: Maximum assistance Feeding assistance: Limited assistance Dressing Assistance: Maximum assistance     Functional Limitations Info             SPECIAL CARE FACTORS FREQUENCY  PT (By licensed PT), OT (By licensed OT)     PT Frequency: 5x/wk OT Frequency: 5x/wk            Contractures      Additional Factors Info  Code Status, Allergies, Psychotropic, Insulin Sliding Scale Code Status Info: Full Allergies Info: Penicillins Psychotropic Info: Celexa 40mg , Zoloft 100mg  Insulin Sliding Scale Info: every 4 hours       Current Medications (12/31/2016):  This is the current hospital active medication  list Current Facility-Administered Medications  Medication Dose Route Frequency Provider Last Rate Last Dose  . 0.9 %  sodium chloride infusion   Intravenous Continuous Marvel Plan, MD 50 mL/hr at 12/31/16 0027    . acetaminophen (TYLENOL) tablet 650 mg  650 mg Oral Q4H PRN Rejeana Brock, MD   650 mg at 12/29/16 2109   Or  . acetaminophen (TYLENOL) solution 650 mg  650 mg Per Tube Q4H PRN Rejeana Brock, MD       Or  . acetaminophen (TYLENOL) suppository 650 mg  650 mg Rectal Q4H PRN Rejeana Brock, MD      . amLODipine (NORVASC) tablet 10 mg  10 mg Oral QHS Patteson, Samuel A, NP   10 mg at 12/30/16 2213  . atenolol (TENORMIN) tablet 50 mg  50 mg Oral QHS Patteson, Samuel A, NP   50 mg at 12/30/16 2213  . atorvastatin (LIPITOR) tablet 80 mg  80 mg Oral q1800 Carolyn Stare A, NP   80 mg at 12/30/16 1708  . chlorthalidone (HYGROTON) tablet 50 mg  50 mg Oral Daily Patteson, Samuel A, NP   50 mg at 12/31/16 0934  . citalopram (CELEXA) tablet 40 mg  40 mg Oral Daily Patteson, Samuel A, NP   40 mg at 12/31/16 0934  . clopidogrel (PLAVIX) tablet 75 mg  75 mg Oral Daily Marvel Plan, MD  75 mg at 12/31/16 0937  . folic acid (FOLVITE) tablet 1 mg  1 mg Oral Daily Patteson, Samuel A, NP   1 mg at 12/31/16 0938  . insulin aspart (novoLOG) injection 0-15 Units  0-15 Units Subcutaneous Q4H Marton Redwood, NP   3 Units at 12/31/16 [phone]  . insulin glargine (LANTUS) injection 20 Units  20 Units Subcutaneous Daily Marton Redwood, NP   20 Units at 12/31/16 [phone]  . iopamidol (ISOVUE-370) 76 % injection 50 mL  50 mL Intravenous Once PRN Eber Hong, MD      . lisinopril (PRINIVIL,ZESTRIL) tablet 40 mg  40 mg Oral Daily Carolyn Stare A, NP   40 mg at 12/31/16 0936  . living well with diabetes book MISC   Does not apply Once Micki Riley, MD      . pantoprazole (PROTONIX) EC tablet 40 mg  40 mg Oral QHS Micki Riley, MD   40 mg at 12/30/16 2213  . potassium chloride SA  (K-DUR,KLOR-CON) CR tablet 40 mEq  40 mEq Oral Q4H Marvel Plan, MD   40 mEq at 12/31/16 0933  . RESOURCE THICKENUP CLEAR   Oral PRN Micki Riley, MD      . sertraline (ZOLOFT) tablet 100 mg  100 mg Oral Daily Patteson, Samuel A, NP   100 mg at 12/31/16 4782     Discharge Medications: Please see discharge summary for a list of discharge medications.  Relevant Imaging Results:  Relevant Lab Results:   Additional Information SS#: [id number]  Baldemar Lenis, LCSW

## 2017-01-01 DIAGNOSIS — E785 Hyperlipidemia, unspecified: Secondary | ICD-10-CM

## 2017-01-01 DIAGNOSIS — I63312 Cerebral infarction due to thrombosis of left middle cerebral artery: Secondary | ICD-10-CM | POA: Diagnosis not present

## 2017-01-01 DIAGNOSIS — I10 Essential (primary) hypertension: Secondary | ICD-10-CM

## 2017-01-01 DIAGNOSIS — E876 Hypokalemia: Secondary | ICD-10-CM

## 2017-01-01 DIAGNOSIS — E782 Mixed hyperlipidemia: Secondary | ICD-10-CM | POA: Diagnosis not present

## 2017-01-01 DIAGNOSIS — E1159 Type 2 diabetes mellitus with other circulatory complications: Secondary | ICD-10-CM | POA: Diagnosis not present

## 2017-01-01 DIAGNOSIS — E119 Type 2 diabetes mellitus without complications: Secondary | ICD-10-CM

## 2017-01-01 LAB — GLUCOSE, CAPILLARY
GLUCOSE-CAPILLARY: 145 mg/dL — AB (ref 65–99)
GLUCOSE-CAPILLARY: 238 mg/dL — AB (ref 65–99)
Glucose-Capillary: 140 mg/dL — ABNORMAL HIGH (ref 65–99)
Glucose-Capillary: 184 mg/dL — ABNORMAL HIGH (ref 65–99)
Glucose-Capillary: 235 mg/dL — ABNORMAL HIGH (ref 65–99)
Glucose-Capillary: 276 mg/dL — ABNORMAL HIGH (ref 65–99)
Glucose-Capillary: 287 mg/dL — ABNORMAL HIGH (ref 65–99)
Glucose-Capillary: 355 mg/dL — ABNORMAL HIGH (ref 65–99)

## 2017-01-01 LAB — BASIC METABOLIC PANEL
ANION GAP: 9 (ref 5–15)
BUN: 13 mg/dL (ref 6–20)
CALCIUM: 9.5 mg/dL (ref 8.9–10.3)
CO2: 28 mmol/L (ref 22–32)
Chloride: 100 mmol/L — ABNORMAL LOW (ref 101–111)
Creatinine, Ser: 1.05 mg/dL (ref 0.61–1.24)
GLUCOSE: 145 mg/dL — AB (ref 65–99)
POTASSIUM: 3.2 mmol/L — AB (ref 3.5–5.1)
Sodium: 137 mmol/L (ref 135–145)

## 2017-01-01 MED ORDER — POTASSIUM CHLORIDE CRYS ER 20 MEQ PO TBCR
40.0000 meq | EXTENDED_RELEASE_TABLET | ORAL | Status: AC
  Administered 2017-01-01 (×2): 40 meq via ORAL
  Filled 2017-01-01 (×2): qty 2

## 2017-01-01 MED ORDER — INSULIN ASPART 100 UNIT/ML ~~LOC~~ SOLN
0.0000 [IU] | Freq: Three times a day (TID) | SUBCUTANEOUS | Status: DC
  Administered 2017-01-01: 8 [IU] via SUBCUTANEOUS
  Administered 2017-01-02: 3 [IU] via SUBCUTANEOUS
  Administered 2017-01-02: 5 [IU] via SUBCUTANEOUS
  Administered 2017-01-02: 3 [IU] via SUBCUTANEOUS

## 2017-01-01 MED ORDER — INSULIN ASPART 100 UNIT/ML ~~LOC~~ SOLN
5.0000 [IU] | Freq: Three times a day (TID) | SUBCUTANEOUS | Status: DC
  Administered 2017-01-02 (×3): 5 [IU] via SUBCUTANEOUS

## 2017-01-01 NOTE — Progress Notes (Addendum)
Physical Therapy Treatment Patient Details Name: [name] [name] MRN: [id number] DOB: Mar 12, 1971 Today's Date: 01/01/2017    History of Present Illness Pt is a 46 y/o male w/ h/o previous stroke admitted with new onset of symptoms involving his dominant right side. MRI revealed 9mm acute infarct in posterior limb left internal capsule. PMH includes: CVA, HTN, hyperlipidemia, DM, smoker, UTI.    PT Comments    Pt tolerated therapy well this date. Focused on sitting and standing balance and achieving and maintaining midline. Pt remains to have no R UE function and increased tone. Pt demo'd some active movements in R LE. Pt to strongly benefit from inpatient rehab upon d/c to maximize functional recovery for safe d/c home with sister.    Follow Up Recommendations  CIR     Equipment Recommendations  Wheelchair (measurements PT);Wheelchair cushion (measurements PT)    Recommendations for Other Services Rehab consult     Precautions / Restrictions Precautions Precautions: Fall Precaution Comments: right sided weakness; Pusher syndrome Restrictions Weight Bearing Restrictions: No    Mobility  Bed Mobility Overal bed mobility: Needs Assistance Bed Mobility: Rolling;Sidelying to Sit;Sit to Supine Rolling: Min assist Sidelying to sit: Mod assist;+2 for physical assistance   Sit to supine: Mod assist;+2 for physical assistance   General bed mobility comments: pt initiated rolling, v/c's to use L LE to assist R LE off EOB, pt able to push up with L UE but unable to brace self on R elbow or use R UE functionally during transfer. modA for trunk elevation and to bring hips to EOB  Transfers Overall transfer level: Needs assistance Equipment used:  (2 person lift with gait belt and stedy) Transfers: Sit to/from Stand Sit to Stand: Mod assist;+2 physical assistance         General transfer comment: PT at R foot and knee for optimal support and tactile stimulation to improve quad  contractibility and 2nd peson to assist with power up, completed 2 trials.  Ambulation/Gait             General Gait Details: unable at this time.    Stairs            Wheelchair Mobility    Modified Rankin (Stroke Patients Only) Modified Rankin (Stroke Patients Only) Pre-Morbid Rankin Score: No significant disability Modified Rankin: Severe disability     Balance Overall balance assessment: Needs assistance Sitting-balance support: Feet supported;Single extremity supported Sitting balance-Leahy Scale: Poor Sitting balance - Comments: worked on finding midline and maintaining. used long mirror for biofeedback. pt able to with max cuing and minA achieve midline position and had 1 trial where he was able to maintain for 10 sec. pt fatigues quickly and has a hard time achieving midline without using L UE. focused on using trunk muscles Postural control: Right lateral lean Standing balance support: Single extremity supported Standing balance-Leahy Scale: Zero Standing balance comment: worked in stedy, 1 person at R knee to assist with optimal position and support and then tech at hips and PT under R arm pit to achieve optimal position. 2 episodes, pt able to stand x 1 min each                            Cognition Arousal/Alertness: Awake/alert Behavior During Therapy: Flat affect Overall Cognitive Status: Within Functional Limits for tasks assessed  Exercises General Exercises - Lower Extremity Quad Sets: AROM;Right;5 reps;Supine Heel Slides: AAROM;Right;10 reps;Supine    General Comments        Pertinent Vitals/Pain Pain Assessment: No/denies pain    Home Living                      Prior Function            PT Goals (current goals can now be found in the care plan section) Progress towards PT goals: Progressing toward goals    Frequency    Min 4X/week      PT Plan  Current plan remains appropriate    Co-evaluation              AM-PAC PT "6 Clicks" Daily Activity  Outcome Measure  Difficulty turning over in bed (including adjusting bedclothes, sheets and blankets)?: Unable Difficulty moving from lying on back to sitting on the side of the bed? : Unable Difficulty sitting down on and standing up from a chair with arms (e.g., wheelchair, bedside commode, etc,.)?: Unable Help needed moving to and from a bed to chair (including a wheelchair)?: Total Help needed walking in hospital room?: Total Help needed climbing 3-5 steps with a railing? : Total 6 Click Score: 6    End of Session Equipment Utilized During Treatment: Gait belt Activity Tolerance: Patient tolerated treatment well Patient left: in bed;with call bell/phone within reach Nurse Communication: Mobility status;Need for lift equipment (assist pt to chair later today) PT Visit Diagnosis: Difficulty in walking, not elsewhere classified (R26.2);Hemiplegia and hemiparesis Hemiplegia - Right/Left: Right Hemiplegia - dominant/non-dominant: Dominant Hemiplegia - caused by: Cerebral infarction     Time: 8115-7262 PT Time Calculation (min) (ACUTE ONLY): 28 min  Charges:  $Therapeutic Activity: 8-22 mins $Neuromuscular Re-education: 8-22 mins                    G Codes:       Lewis Shock, PT, DPT Pager #: [phone] Office #: [phone]    Lively Haberman M Gerre Ranum 01/01/2017, 11:16 AM

## 2017-01-01 NOTE — Progress Notes (Signed)
Occupational Therapy Treatment Patient Details Name: [name] [name] MRN: [id number] DOB: April 08, 1971 Today's Date: 01/01/2017    History of present illness Pt is a 46 y/o male w/ h/o previous stroke admitted with new onset of symptoms involving his dominant right side. MRI revealed 9mm acute infarct in posterior limb left internal capsule. PMH includes: CVA, HTN, hyperlipidemia, DM, smoker, UTI.   OT comments  Pt progressing toward established OT goals. Pt performed grooming task at EOB with Mod-Max A to maintain sitting posture due to R lateral lean. Pt requiring hand-over-hand A to incorporate RUE into grooming task and use to WB against bed. Will continue to follow acute OT to facilitate safe dc. Pt would benefit from intense OT through CIR to optimize pt safety and independence with ADL and functional mobility.    Follow Up Recommendations  CIR    Equipment Recommendations  Other (comment) (Defer to next venue)    Recommendations for Other Services Rehab consult    Precautions / Restrictions Precautions Precautions: Fall Precaution Comments: right sided weakness; Pusher syndrome Restrictions Weight Bearing Restrictions: No       Mobility Bed Mobility Overal bed mobility: Needs Assistance Bed Mobility: Rolling;Sidelying to Sit;Sit to Supine Rolling: Min assist Sidelying to sit: Mod assist;+2 for physical assistance Supine to sit: Max assist;HOB elevated Sit to supine: Mod assist;+2 for physical assistance   General bed mobility comments: pt initiated rolling, v/c's to use L LE to assist R LE off EOB, pt able to push up with L UE but unable to brace self on R elbow or use R UE functionally during transfer. modA for trunk elevation and to bring hips to EOB  Transfers                 General transfer comment: Focused session on grooming and sitting balance    Balance Overall balance assessment: Needs assistance Sitting-balance support: Feet supported;Single extremity  supported Sitting balance-Leahy Scale: Poor Sitting balance - Comments: Provided Mod-Max A to maintain upright posture at EOB.  Postural control: Right lateral lean                                 ADL either performed or assessed with clinical judgement   ADL Overall ADL's : Needs assistance/impaired     Grooming: Moderate assistance;Sitting;Cueing for compensatory techniques;Cueing for sequencing;Cueing for safety;Maximal assistance Grooming Details (indicate cue type and reason): Pt requiring Mod-Max A for maintaining sitting posture at EOB. Pt with significant lean to R. Pt changing from needing mod-max A to maintain sitting. Pt requiring hand over hand A to use RUE in grooming task. Used hand over hand to hold toothpaste while pt twist off cap with L hand. Provided hand over hand to use RUE for weight bearing on bed and therapist knee.                                General ADL Comments: Pt performed grooming task at EOB.      Vision       Perception     Praxis      Cognition Arousal/Alertness: Awake/alert Behavior During Therapy: Flat affect Overall Cognitive Status: Impaired/Different from baseline Area of Impairment: Following commands;Problem solving                       Following Commands: Follows one step  commands inconsistently;Follows one step commands with increased time     Problem Solving: Requires verbal cues;Requires tactile cues General Comments: Provided verbal and visual cues to sequence grooming task at EOB        Exercises Exercises: General Upper Extremity General Exercises - Upper Extremity Shoulder Flexion: PROM;Right;5 reps;Supine Elbow Flexion: PROM;Right;10 reps;Supine Elbow Extension: PROM;Right;10 reps;Supine Wrist Flexion: PROM;Right;5 reps;Supine Wrist Extension: PROM;Right;5 reps;Supine Digit Composite Flexion: PROM;Right;5 reps;Supine Composite Extension: PROM;Right;5 reps;Supine   Shoulder  Instructions       General Comments Pt verbalizing frustration with decreased strength and functional use of RUE. Pt states "it is dead."    Pertinent Vitals/ Pain       Pain Assessment: No/denies pain  Home Living                                          Prior Functioning/Environment              Frequency  Min 2X/week        Progress Toward Goals  OT Goals(current goals can now be found in the care plan section)  Progress towards OT goals: Progressing toward goals  Acute Rehab OT Goals Patient Stated Goal: get stronger Time For Goal Achievement: 01/11/17 Potential to Achieve Goals: Good ADL Goals Pt Will Perform Grooming: with set-up;with min assist;sitting Pt Will Perform Upper Body Dressing: with supervision;with set-up;sitting Pt Will Transfer to Toilet: stand pivot transfer;bedside commode;grab bars;with min guard assist (drop arm commode) Pt Will Perform Toileting - Clothing Manipulation and hygiene: with min assist;sitting/lateral leans;sit to/from stand Additional ADL Goal #1: Pt will sit EOB with Supervison-Min A in preparation for increased performance of ADL's Additional ADL Goal #2: Pt will be Supervision-Min A for RUE HEP for P/ROM & neuromuscular re-education  Plan Discharge plan remains appropriate    Co-evaluation                 AM-PAC PT "6 Clicks" Daily Activity     Outcome Measure   Help from another person eating meals?: A Little Help from another person taking care of personal grooming?: A Little Help from another person toileting, which includes using toliet, bedpan, or urinal?: A Lot Help from another person bathing (including washing, rinsing, drying)?: A Lot Help from another person to put on and taking off regular upper body clothing?: A Lot Help from another person to put on and taking off regular lower body clothing?: Total 6 Click Score: 13    End of Session    OT Visit Diagnosis: Unsteadiness on feet  (R26.81);Other abnormalities of gait and mobility (R26.89);Hemiplegia and hemiparesis Symptoms and signs involving cognitive functions: Cerebral infarction Hemiplegia - Right/Left: Right Hemiplegia - dominant/non-dominant: Dominant Hemiplegia - caused by: Cerebral infarction   Activity Tolerance Patient tolerated treatment well;Patient limited by fatigue   Patient Left in bed;with call bell/phone within reach;Other (comment) (with SLP)   Nurse Communication Mobility status;Precautions        Time: [phone] OT Time Calculation (min): 15 min  Charges: OT General Charges $OT Visit: 1 Visit OT Treatments $Self Care/Home Management : 8-22 mins  Falicia Lizotte MSOT, OTR/L Acute Rehab Pager: [phone] Office: [phone]   Theodoro Grist Nazaiah Navarrete 01/01/2017, 3:52 PM

## 2017-01-01 NOTE — Progress Notes (Signed)
  Speech Language Pathology Treatment: Dysphagia;Cognitive-Linquistic  Patient Details Name: [name] [name] MRN: [id number] DOB: 04-Dec-1970 Today's Date: 01/01/2017 Time: 0867-6195 SLP Time Calculation (min) (ACUTE ONLY): 24 min  Assessment / Plan / Recommendation Clinical Impression  Patient able to self feed clinician provided po trials of prescribed diet with independent use of compensatory strategies and aspiration precautions. Participated in speech intelligibility task as the sentence level with SLP facilitating session via verbal and demonstration cues. Patient able to produce intelligible speech in 75%  of opportunities with moderate clinician verbal and visual cueing for use of strategies including over-articulation. Noted frequent vocal quality and intensity changes at the conversation level, with periods of strained, rough vocal quality, and burst of loud phonatory output. Question component of ataxic vs spasdic dysarthria. Patient denies awareness. Continued treatment to address above as well as prosody, particularly monopitch tone.    HPI HPI: RAFEL [name] a 46 y.o.malewho was last seen well 6:30 PM which time he began experiencing slurred speech, difficult walking, facial droop. He has a history of diabetes and hypertension and smokes 2-3 cigarettes per day. MRI showed 9 mm acute infarct in the posterior limb left internal capsule, Chronic small vessel ischemia with multiple remote lacunar      SLP Plan  Continue with current plan of care       Recommendations  Diet recommendations: Dysphagia 3 (mechanical soft);Nectar-thick liquid Liquids provided via: Cup Medication Administration: Whole meds with puree Supervision: Staff to assist with self feeding Compensations: Slow rate;Small sips/bites;Multiple dry swallows after each bite/sip Postural Changes and/or Swallow Maneuvers: Seated upright 90 degrees                General recommendations: Rehab consult Oral Care  Recommendations: Oral care BID Follow up Recommendations: Inpatient Rehab SLP Visit Diagnosis: Dysphagia, oropharyngeal phase (R13.12);Dysarthria and anarthria (R47.1) Plan: Continue with current plan of care       GO             [facility] MA, CCC-SLP [phone]    Ferdinand Lango Meryl 01/01/2017, 3:42 PM

## 2017-01-01 NOTE — Progress Notes (Signed)
CSW spoke with [place] with [facility]- she states that patient does NOT have VA benefits for SNF so would not be eligible for placement through the [place]  CSW spoke with supervisor who will approved LOG pending financial counseling evaluation to ensure patient does not have too many resources  CSW spoke with financial counseling who will investigate  CSW will continue to follow  Burna Sis, LCSW Clinical Social Worker [phone]

## 2017-01-01 NOTE — Progress Notes (Signed)
STROKE TEAM PROGRESS NOTE   SUBJECTIVE (INTERVAL HISTORY) No family is at the bedside.  Pt is awake and alert. CSW is working for placement.  Pt does not have VA benefits for SNF.  CSW will likely place pt in non-VA SNF with letter of guarantee after conducting financial counseling with pt. PT/OT still recommend CIR.   OBJECTIVE Temp:  [97.6 F (36.4 C)-99 F (37.2 C)] 98.9 F (37.2 C) (09/10 1300) Pulse Rate:  [54-68] 62 (09/10 1300) Cardiac Rhythm: Normal sinus rhythm (09/10 0900) Resp:  [16-20] 16 (09/10 1300) BP: (104-142)/(72-88) 132/80 (09/10 1300) SpO2:  [97 %-100 %] 97 % (09/10 1300)  CBC:   Recent Labs Lab 12/25/16 1902  12/27/16 0236 12/31/16 0444  WBC 9.4  --  10.7* 9.7  NEUTROABS 6.2  --   --   --   HGB 15.6  < > 14.7 14.7  HCT 42.7  < > 41.5 42.0  MCV 81.8  --  83.5 84.0  PLT 249  --  271 251  < > = values in this interval not displayed.  Basic Metabolic Panel:   Recent Labs Lab 12/31/16 0444 01/01/17 0400  NA 137 137  K 3.2* 3.2*  CL 100* 100*  CO2 28 28  GLUCOSE 172* 145*  BUN 10 13  CREATININE 0.96 1.05  CALCIUM 9.4 9.5    Lipid Panel:     Component Value Date/Time   CHOL 222 (H) 12/26/2016 0226   TRIG 439 (H) 12/26/2016 0226   HDL 39 (L) 12/26/2016 0226   CHOLHDL 5.7 12/26/2016 0226   VLDL UNABLE TO CALCULATE IF TRIGLYCERIDE OVER 400 mg/dL 59/97/7414 2395   LDLCALC UNABLE TO CALCULATE IF TRIGLYCERIDE OVER 400 mg/dL 32/05/3341 5686   HUOH7G:  Lab Results  Component Value Date   HGBA1C 12.3 (H) 12/26/2016   Urine Drug Screen:     Component Value Date/Time   LABOPIA NONE DETECTED 12/25/2016 1949   COCAINSCRNUR NONE DETECTED 12/25/2016 1949   COCAINSCRNUR NEGATIVE 03/09/2013 1841   LABBENZ NONE DETECTED 12/25/2016 1949   AMPHETMU NONE DETECTED 12/25/2016 1949   THCU NONE DETECTED 12/25/2016 1949   LABBARB NONE DETECTED 12/25/2016 1949    Alcohol Level     Component Value Date/Time   ETH <5 12/25/2016 1902    IMAGING I have  personally reviewed the radiological images below and agree with the radiology interpretations.  Ct Angio Head W Or Wo Contrast Ct Angio Neck W And/or Wo Contrast 12/25/2016 IMPRESSION:  1. Negative for emergent large vessel occlusion but positive for severe stenosis of the right PCA P1 and P2 segments.  2. No arterial occlusion identified. Mild or up to moderate irregularity of other circle of Willis branches, including the left PCA and left MCA.  3. No Basilar Artery or extracranial atherosclerosis or stenosis.  4. No acute findings in the neck.  Prior ACDF.   Ct Head Wo Contrast 12/25/2016 IMPRESSION:  1. No acute intracranial abnormality identified.  2. Stable advanced chronic microvascular ischemic changes of the brain for age with multiple chronic lacunar infarcts in basal ganglia and the pons.   12/25/2016 IMPRESSION:  1. No acute cortically based infarct or acute intracranial hemorrhage identified.  2. ASPECTS is 10.  3. Markedly advanced cerebral volume loss and new evidence of bilateral small vessel ischemia since a prior head CT in 2012. 4.  MRI Brain Wo Contrast 12/26/2016 IMPRESSION: 1. 9 mm acute infarct in the posterior limb left internal capsule. 2. Chronic small vessel ischemia  with multiple remote lacunar infarcts, extensive for age.  TTE 12/26/2016  Left ventricle: The cavity size was normal. There was mild   concentric hypertrophy. Systolic function was normal. The   estimated ejection fraction was in the range of 50% to 55%. Wall   motion was normal; there were no regional wall motion   abnormalities     PHYSICAL EXAM Vitals:   01/01/17 0150 01/01/17 0628 01/01/17 0917 01/01/17 1300  BP: 115/72 130/88 104/75 132/80  Pulse: (!) 54 60 63 62  Resp: Temp: 97.9 F (36.6 C) 98.2 F (36.8 C) 97.6 F (36.4 C) 98.9 F (37.2 C)  TempSrc: Oral Oral Oral Oral  SpO2: 98% 100% 100% 97%  Weight:      Height:        Pleasant middle-age Caucasian male  currently not in distress. . Afebrile. Head is nontraumatic. Neck is supple without bruit.    Cardiac exam no murmur or gallop. Lungs are clear to auscultation. Distal pulses are well felt. Blood pressure (!) 149/99, pulse 94, temperature 98.1 F (36.7 C), temperature source Axillary, resp. rate 16, height  (1.753 m), weight 182 lb 5.1 oz (82.7 kg), SpO2 97 %. Neurological exam ;  Awake alert oriented 3. Moderate dysarthria. No aphasia. Extraocular moments are full range but he has saccadic dysmetria in either direction. Right lower facial weakness. Tongue midline. Motor system exam right UE 2-/5 with increase muscle tone, RLE 3+/5 with increased tone. LUE and LLE 5/5. Deep tendon reflexes are 3+ on the right. Right babinski positive. Sensation intact. Left FTN intact. Gait not tested.   ASSESSMENT/PLAN [name] [name] is a 46 y.o. male with history of DM2, HTN, previous stroke, and current tobacco abuse (2-3 cigarettes per day), LSN 6:30 PM on 12/25/2016, who presented with slurred speech, difficulty walking, L-sided weakness, and L-sided facial droop. He received IV t-PA at 2027 on 12/25/2016.   Stroke: acute infarct at left PLIC, likely small vessel disease due to poorly controlled diabetes and hyperlipidemia.  Resultant  Dysarthria and right facial droop and right hemiparesis  CT head: no acute stroke  MRI head: 9 mm acute infarct in the posterior limb left internal capsule  CTA head/neck: Severe R P1 and P2 segment stenosis  2D Echo  -  EF 50% to 55%. No cardiac source of emboli identified.  LDL NTC with TG > 400  HgbA1c 12.3  SCDs for VTE prophylaxis DIET DYS 3 Room service appropriate? Yes; Fluid consistency: Nectar Thick  aspirin 81 mg daily prior to admission, now on plavix  daily.  Patient counseled to be compliant with his antithrombotic medications  Ongoing aggressive stroke risk factor management  Therapy recommendations: non-VA SNF with letter of guarantee    Disposition:  pending  Hypertension  Stable On norvasc, atenolol, chlorthalidone, and lisinopril Long-term BP goal normotensive  Hyperlipidemia  Home meds: atorvastatin 80 mg PO, resumed in hospital  Pt was not taking his statin medication at home.  LDL NTC with TG > 400, goal < 70  Continue statin at discharge  Diabetes  HgbA1c 12.3, goal < 7.0  Uncontrolled  Home regimen of lantus 50 units QD, 20 units lantus QHS, and lispro 20 units TIDwc   Pt not taking full prescribed dose of home lantus  On lantus 20U, novolog 5U tidwc  SSI Novolog 0-15 units to tidwc and hs.  Diabetes Coordinator recs appreciated  Tobacco abuse  Current smoker  Smoking cessation counseling provided  Pt is willing to quit  Other Stroke Risk Factors  Noncompliance  Other Active Problems  Hypokalemia - continued to supplement - recheck in a.m.   Hospital day # 7  Marvel Plan, MD PhD Stroke Neurology 01/01/2017 3:11 PM   To contact Stroke Continuity provider, please refer to [email or web address]. After hours, contact General Neurology

## 2017-01-01 NOTE — Progress Notes (Signed)
Inpatient Diabetes Program Recommendations  AACE/ADA: New Consensus Statement on Inpatient Glycemic Control (2015)  Target Ranges:  Prepandial:   less than 140 mg/dL      Peak postprandial:   less than 180 mg/dL (1-2 hours)      Critically ill patients:  140 - 180 mg/dL   Lab Results  Component Value Date   GLUCAP 287 (H) 01/01/2017   HGBA1C 12.3 (H) 12/26/2016    Review of Glycemic Control  Post-prandial blood sugars > 180 mg/dL. Needs meal coverage insulin. On Humalog 20 units tidwc.   Inpatient Diabetes Program Recommendations:     Add Novolog 5 units tidwc. Change Novolog 0-15 units to tidwc and hs.   Will continue to follow.   Thank you. Ailene Ards, RD, LDN, CDE Inpatient Diabetes Coordinator [phone]

## 2017-01-02 DIAGNOSIS — I63312 Cerebral infarction due to thrombosis of left middle cerebral artery: Secondary | ICD-10-CM | POA: Diagnosis not present

## 2017-01-02 DIAGNOSIS — E1159 Type 2 diabetes mellitus with other circulatory complications: Secondary | ICD-10-CM | POA: Diagnosis not present

## 2017-01-02 DIAGNOSIS — E785 Hyperlipidemia, unspecified: Secondary | ICD-10-CM | POA: Diagnosis not present

## 2017-01-02 DIAGNOSIS — I10 Essential (primary) hypertension: Secondary | ICD-10-CM | POA: Diagnosis not present

## 2017-01-02 LAB — BASIC METABOLIC PANEL
Anion gap: 6 (ref 5–15)
BUN: 13 mg/dL (ref 6–20)
CHLORIDE: 99 mmol/L — AB (ref 101–111)
CO2: 32 mmol/L (ref 22–32)
CREATININE: 1.15 mg/dL (ref 0.61–1.24)
Calcium: 9.9 mg/dL (ref 8.9–10.3)
GFR calc Af Amer: >60 mL/min (ref >60)
GFR calc non Af Amer: >60 mL/min (ref >60)
Glucose, Bld: 145 mg/dL — ABNORMAL HIGH (ref 65–99)
Potassium: 3.5 mmol/L (ref 3.5–5.1)
Sodium: 137 mmol/L (ref 135–145)

## 2017-01-02 LAB — GLUCOSE, CAPILLARY
GLUCOSE-CAPILLARY: 205 mg/dL — AB (ref 65–99)
Glucose-Capillary: 170 mg/dL — ABNORMAL HIGH (ref 65–99)
Glucose-Capillary: 175 mg/dL — ABNORMAL HIGH (ref 65–99)
Glucose-Capillary: 209 mg/dL — ABNORMAL HIGH (ref 65–99)

## 2017-01-02 LAB — CBC
HEMATOCRIT: 41.7 % (ref 39.0–52.0)
HEMOGLOBIN: 14.5 g/dL (ref 13.0–17.0)
MCH: 29.5 pg (ref 26.0–34.0)
MCHC: 34.8 g/dL (ref 30.0–36.0)
MCV: 84.8 fL (ref 78.0–100.0)
Platelets: 257 10*3/uL (ref 150–400)
RBC: 4.92 MIL/uL (ref 4.22–5.81)
RDW: 12.2 % (ref 11.5–15.5)
WBC: 9.7 10*3/uL (ref 4.0–10.5)

## 2017-01-02 LAB — MAGNESIUM: Magnesium: 1.8 mg/dL (ref 1.7–2.4)

## 2017-01-02 MED ORDER — INSULIN ASPART 100 UNIT/ML ~~LOC~~ SOLN: 5.0000 [IU] | Freq: Three times a day (TID) | SUBCUTANEOUS | 11 refills | Status: DC

## 2017-01-02 MED ORDER — CLOPIDOGREL BISULFATE 75 MG PO TABS
75.0000 mg | ORAL_TABLET | Freq: Every day | ORAL | 0 refills | Status: DC
Start: 2017-01-03 — End: 2020-01-25

## 2017-01-02 MED ORDER — INSULIN LISPRO 100 UNIT/ML ~~LOC~~ SOLN: 20.0000 [IU] | Freq: Three times a day (TID) | SUBCUTANEOUS | 11 refills | Status: DC

## 2017-01-02 NOTE — Care Management Note (Signed)
Case Management Note  Patient Details  Name: [name] [name] MRN: [id number] Date of Birth: [birth year]  Subjective/Objective:                    Action/Plan: Pt is discharging to [facility] today. No further needs per CM.   Expected Discharge Date:  01/02/17               Expected Discharge Plan:  Skilled Nursing Facility  In-House Referral:  Clinical Social Work  Discharge planning Services     Post Acute Care Choice:    Choice offered to:     DME Arranged:    DME Agency:     HH Arranged:    HH Agency:     Status of Service:  Completed, signed off  If discussed at [employer], dates discussed:    Additional Comments:  Kermit Balo, RN 01/02/2017, 4:47 PM

## 2017-01-02 NOTE — Clinical Social Work Placement (Signed)
   CLINICAL SOCIAL WORK PLACEMENT  NOTE  Date:  01/02/2017  Patient Details  Name: [name] [name] MRN: [id number] Date of Birth: [birth year]  Clinical Social Work is seeking post-discharge placement for this patient at the Skilled  Nursing Facility level of care (*CSW will initial, date and re-position this form in  chart as items are completed):  Yes   Patient/family provided with [facility] Clinical Social Work Department's list of facilities offering this level of care within the geographic area requested by the patient (or if unable, by the patient's family).  Yes   Patient/family informed of their freedom to choose among providers that offer the needed level of care, that participate in Medicare, Medicaid or managed care program needed by the patient, have an available bed and are willing to accept the patient.  Yes   Patient/family informed of [facility]'s ownership interest in [facility] and [facility], as well as of the fact that they are under no obligation to receive care at these facilities.  PASRR submitted to EDS on 12/31/16     PASRR number received on 12/31/16     Existing PASRR number confirmed on       FL2 transmitted to all facilities in geographic area requested by pt/family on 12/31/16     FL2 transmitted to all facilities within larger geographic area on       Patient informed that his/her managed care company has contracts with or will negotiate with certain facilities, including the following:        Yes   Patient/family informed of bed offers received.  Patient chooses bed at [facility]     Physician recommends and patient chooses bed at      Patient to be transferred to [facility] on 01/02/17.  Patient to be transferred to facility by ptar     Patient family notified on 01/02/17 of transfer.  Name of family member notified:  Debbie     PHYSICIAN Please sign FL2     Additional Comment:     _______________________________________________ Burna Sis, LCSW 01/02/2017, 4:36 PM

## 2017-01-02 NOTE — Progress Notes (Signed)
Pt discharge education and instructions completed with pt. IV and telemetry removed; pt discharged to [facility] and RN attempted x3 to report off but no one answered the phone to [phone]. Pt transported off unit via stretcher by PTAR to be transported off to disposition. Pt transported off unit with belongings to the side. Dionne Bucy RN

## 2017-01-02 NOTE — Progress Notes (Signed)
Patient will discharge to [facility] Anticipated discharge date: 9/11 Family notified: sister Transportation by [facility]- called at 4:30 Report# [phone]  CSW signing off.  Burna Sis, LCSW Clinical Social Worker [phone]

## 2017-01-02 NOTE — Discharge Summary (Signed)
Stroke Discharge Summary  Patient ID: [name] [name]   MRN: [id number]      DOB: 18-Dec-1970  Date of Admission: 12/25/2016 Date of Discharge: 01/02/2017  Attending Physician:  Marvel Plan, MD, Stroke MD Consultant(s):   Treatment Team:  Stroke, Md, MD Patient's PCP:  [facility]  DISCHARGE DIAGNOSIS: Principal Problem:   Stroke [facility]) -  acute infarct at left PLIC, likely small vessel disease due to poorly controlled diabetes and hyperlipidemia. Active Problems:   Stroke (cerebrum) (HCC)   DM (diabetes mellitus) (HCC)   HTN (hypertension)   HLD (hyperlipidemia)   Past Medical History:  Diagnosis Date  . Diabetes mellitus without complication (HCC)   . Hypertension   . Stroke [facility])    Past Surgical History:  Procedure Laterality Date  . APPENDECTOMY    . BACK SURGERY      Allergies as of 01/02/2017      Reactions   Penicillins Other (See Comments)   Has patient had a PCN reaction causing immediate rash, facial/tongue/throat swelling, SOB or lightheadedness with hypotension: Yes Has patient had a PCN reaction causing severe rash involving mucus membranes or skin necrosis: No Has patient had a PCN reaction that required hospitalization: No Has patient had a PCN reaction occurring within the last 10 years: No If all of the above answers are "NO", then may proceed with Cephalosporin use.      Medication List    STOP taking these medications   aspirin EC 81 MG tablet   ondansetron 4 MG tablet Commonly known as:  ZOFRAN     TAKE these medications   amLODipine 10 MG tablet Commonly known as:  NORVASC Take 10 mg by mouth at bedtime.   atenolol 50 MG tablet Commonly known as:  TENORMIN Take 50 mg by mouth at bedtime.   atorvastatin 80 MG tablet Commonly known as:  LIPITOR Take 80 mg by mouth daily.   chlorthalidone 50 MG tablet Commonly known as:  HYGROTON Take 50 mg by mouth daily.   citalopram 40 MG tablet Commonly known as:  CELEXA Take 40  mg by mouth daily.   clopidogrel 75 MG tablet Commonly known as:  PLAVIX Take 1 tablet (75 mg total) by mouth daily.   folic acid 1 MG tablet Commonly known as:  FOLVITE Take 1 mg by mouth daily.   insulin aspart 100 UNIT/ML injection Commonly known as:  novoLOG Inject 5 Units into the skin 3 (three) times daily with meals.   insulin glargine 100 UNIT/ML injection Commonly known as:  LANTUS Inject 20 Units into the skin at bedtime.   insulin lispro 100 UNIT/ML injection Commonly known as:  HUMALOG Inject 0.2 mLs (20 Units total) into the skin 3 (three) times daily.   lisinopril 40 MG tablet Commonly known as:  PRINIVIL,ZESTRIL Take 40 mg by mouth daily.   sertraline 100 MG tablet Commonly known as:  ZOLOFT Take 100 mg by mouth daily.            Discharge Care Instructions        Start     Ordered   01/03/17 0000  clopidogrel (PLAVIX) 75 MG tablet  Daily     01/02/17 1529   01/02/17 0000  insulin aspart (NOVOLOG) 100 UNIT/ML injection  3 times daily with meals     01/02/17 1529   01/02/17 0000  insulin lispro (HUMALOG) 100 UNIT/ML injection  3 times daily     01/02/17 1529  01/02/17 0000  Ambulatory referral to Neurology    Comments:  An appointment is requested with [name] Angel, NP in approximately: 6 - 8 weeks   01/02/17 1542      LABORATORY STUDIES CBC    Component Value Date/Time   WBC 9.7 01/02/2017 0441   RBC 4.92 01/02/2017 0441   HGB 14.5 01/02/2017 0441   HGB 15.0 03/09/2013 1727   HCT 41.7 01/02/2017 0441   HCT 42.5 03/09/2013 1727   PLT 257 01/02/2017 0441   PLT 192 03/09/2013 1727   MCV 84.8 01/02/2017 0441   MCV 86 03/09/2013 1727   MCH 29.5 01/02/2017 0441   MCHC 34.8 01/02/2017 0441   RDW 12.2 01/02/2017 0441   RDW 12.2 03/09/2013 1727   LYMPHSABS 2.6 12/25/2016 1902   MONOABS 0.5 12/25/2016 1902   EOSABS 0.1 12/25/2016 1902   BASOSABS 0.0 12/25/2016 1902   CMP    Component Value Date/Time   NA 137 01/02/2017 0441   NA  132 (L) 03/09/2013 1727   K 3.5 01/02/2017 0441   K 3.5 03/09/2013 1727   CL 99 (L) 01/02/2017 0441   CL 97 (L) 03/09/2013 1727   CO2 32 01/02/2017 0441   CO2 30 03/09/2013 1727   GLUCOSE 145 (H) 01/02/2017 0441   GLUCOSE 392 (H) 03/09/2013 1727   BUN 13 01/02/2017 0441   BUN 15 03/09/2013 1727   CREATININE 1.15 01/02/2017 0441   CREATININE 1.01 03/09/2013 1727   CALCIUM 9.9 01/02/2017 0441   CALCIUM 9.2 03/09/2013 1727   PROT 7.5 12/26/2016 0226   PROT 7.1 03/09/2013 1727   ALBUMIN 3.6 12/26/2016 0226   ALBUMIN 3.7 03/09/2013 1727   AST 25 12/26/2016 0226   AST 30 03/09/2013 1727   ALT 26 12/26/2016 0226   ALT 57 03/09/2013 1727   ALKPHOS 86 12/26/2016 0226   ALKPHOS 111 03/09/2013 1727   BILITOT 0.9 12/26/2016 0226   BILITOT 0.5 03/09/2013 1727   GFRNONAA >60 01/02/2017 0441   GFRNONAA >60 03/09/2013 1727   GFRAA >60 01/02/2017 0441   GFRAA >60 03/09/2013 1727   COAGS Lab Results  Component Value Date   INR 0.89 12/25/2016   Lipid Panel    Component Value Date/Time   CHOL 222 (H) 12/26/2016 0226   TRIG 439 (H) 12/26/2016 0226   HDL 39 (L) 12/26/2016 0226   CHOLHDL 5.7 12/26/2016 0226   VLDL UNABLE TO CALCULATE IF TRIGLYCERIDE OVER 400 mg/dL 40/98/1191 4782   LDLCALC UNABLE TO CALCULATE IF TRIGLYCERIDE OVER 400 mg/dL 95/62/1308 6578   IONG2X  Lab Results  Component Value Date   HGBA1C 12.3 (H) 12/26/2016   Urinalysis    Component Value Date/Time   COLORURINE YELLOW 12/25/2016 1949   APPEARANCEUR CLEAR 12/25/2016 1949   LABSPEC 1.026 12/25/2016 1949   PHURINE 6.0 12/25/2016 1949   GLUCOSEU >=500 (A) 12/25/2016 1949   HGBUR NEGATIVE 12/25/2016 1949   BILIRUBINUR NEGATIVE 12/25/2016 1949   KETONESUR NEGATIVE 12/25/2016 1949   PROTEINUR >=300 (A) 12/25/2016 1949   NITRITE NEGATIVE 12/25/2016 1949   LEUKOCYTESUR NEGATIVE 12/25/2016 1949   Urine Drug Screen     Component Value Date/Time   LABOPIA NONE DETECTED 12/25/2016 1949   COCAINSCRNUR NONE  DETECTED 12/25/2016 1949   COCAINSCRNUR NEGATIVE 03/09/2013 1841   LABBENZ NONE DETECTED 12/25/2016 1949   AMPHETMU NONE DETECTED 12/25/2016 1949   THCU NONE DETECTED 12/25/2016 1949   LABBARB NONE DETECTED 12/25/2016 1949    Alcohol Level    Component Value  Date/Time   ETH <5 12/25/2016 1902     SIGNIFICANT DIAGNOSTIC STUDIES I have personally reviewed the radiological images below and agree with the radiology interpretations.  Ct Angio Head W Or Wo Contrast Ct Angio Neck W And/or Wo Contrast 12/25/2016 IMPRESSION:  1. Negative for emergent large vessel occlusion but positive for severe stenosis of the right PCA P1 and P2 segments.  2. No arterial occlusion identified. Mild or up to moderate irregularity of other circle of Willis branches, including the left PCA and left MCA.  3. No Basilar Artery or extracranial atherosclerosis or stenosis.  4. No acute findings in the neck.  Prior ACDF.   Ct Head Wo Contrast 12/25/2016 IMPRESSION:  1. No acute intracranial abnormality identified.  2. Stable advanced chronic microvascular ischemic changes of the brain for age with multiple chronic lacunar infarcts in basal ganglia and the pons.   12/25/2016 IMPRESSION:  1. No acute cortically based infarct or acute intracranial hemorrhage identified.  2. ASPECTS is 10.  3. Markedly advanced cerebral volume loss and new evidence of bilateral small vessel ischemia since a prior head CT in 2012. 4.  MRI Brain Wo Contrast 12/26/2016 IMPRESSION: 1. 9 mm acute infarct in the posterior limb left internal capsule. 2. Chronic small vessel ischemia with multiple remote lacunar infarcts, extensive for age.  TTE 12/26/2016  Left ventricle: The cavity size was normal. There was mild concentric hypertrophy. Systolic function was normal. The estimated ejection fraction was in the range of 50% to 55%. Wall motion was normal; there were no regional wall motion abnormalities     HISTORY OF  PRESENT ILLNESS [name] [name] is a 46 y.o. male who was last seen well 6:30 PM which time he began experiencing slurred speech, difficult walking, facial droop. He has a history of diabetes and hypertension and smokes 2-3 cigarettes per day.  After arrival, he was taken for a stat head CT which was negative, was within the time window for TPA he received  It after some difficulty controlling his blood pressure.  LKW: 6:30 PM tpa given?: Yes Modified Rankin Score: 1  ROS: A 14 point ROS was performed and is negative except as noted in the HPI.    HOSPITAL COURSE [name] [name] is a 46 y.o. male with history of DM2, HTN, previous stroke, and current tobacco abuse (2-3 cigarettes per day), LSN 6:30 PM on 12/25/2016, who presented with slurred speech, difficulty walking, L-sided weakness, and L-sided facial droop. He received IV t-PA at 2027 on 12/25/2016.   Stroke: acute infarct at left PLIC, likely small vessel disease due to poorly controlled diabetes and hyperlipidemia.  Resultant  Dysarthria and right facial droop and right hemiparesis  CT head: no acute stroke  MRI head: 9 mm acute infarct in the posterior limb left internal capsule  CTA head/neck: Severe R P1 and P2 segment stenosis  2D Echo  -  EF 50% to 55%. No cardiac source of emboli identified.  LDL NTC with TG > 400  HgbA1c 12.3  SCDs for VTE prophylaxis  DIET DYS 3 Room service appropriate? Yes; Fluid consistency: Nectar Thick  aspirin 81 mg daily prior to admission, now on plavix  daily.  Patient counseled to be compliant with his antithrombotic medications  Ongoing aggressive stroke risk factor management  Therapy recommendations: non-VA SNF with letter of guarantee   Disposition: Discharge to SNF  Hypertension  Stable  On norvasc, atenolol, chlorthalidone, and lisinopril  Long-term BP goal normotensive  Hyperlipidemia  Home meds: atorvastatin 80 mg PO, resumed in hospital  Pt was not  taking his statin medication at home.  LDL NTC with TG > 400, goal < 70  Continue statin at discharge  Diabetes  HgbA1c 12.3, goal < 7.0  Uncontrolled  Home regimen of lantus 50 units QD, 20 units lantus QHS, and lispro 20 units TIDwc   Pt not taking full prescribed dose of home lantus  On lantus 20U, novolog 5U tidwc  SSI Novolog 0-15 units to tidwc and hs.  Diabetes Coordinator recs appreciated  Tobacco abuse  Current smoker  Smoking cessation counseling provided  Pt is willing to quit  Other Stroke Risk Factors  Noncompliance  Other Active Problems  Hypokalemia - continued to supplement - recheck in a.m.   DISCHARGE EXAM  Blood pressure 111/69, pulse 63, temperature 98.4 F (36.9 C), temperature source Oral, resp. rate 20, height 5\' 9"  (1.753 m), weight 182 lb 5.1 oz (82.7 kg), SpO2 95 %.   Pleasant middle-age Caucasian male currently not in distress. . Afebrile. Head is nontraumatic. Neck is supple without bruit.    Cardiac exam no murmur or gallop. Lungs are clear to auscultation. Distal pulses are well felt. Blood pressure (!) 149/99, pulse 94, temperature 98.1 F (36.7 C), temperature source Axillary, resp. rate 16, height 5\' 9"  (1.753 m), weight 182 lb 5.1 oz (82.7 kg), SpO2 97 %. Neurological exam ;  Awake alert oriented 3. Moderate dysarthria. No aphasia. Extraocular moments are full range but he has saccadic dysmetria in either direction. Right lower facial weakness. Tongue midline. Motor system exam right UE 2-/5 with increase muscle tone, RLE 3+/5 with increased tone. LUE and LLE 5/5. Deep tendon reflexes are 3+ on the right. Right babinski positive. Sensation intact. Left FTN intact. Gait not tested.  Discharge Diet   DIET DYS 3 Room service appropriate? Yes; Fluid consistency: Nectar Thick liquids  DISCHARGE PLAN  Disposition: Discharge to SNF  clopidogrel 75 mg daily for secondary stroke prevention.  Ongoing risk factor control by Primary  Care Physician at time of discharge  Follow-up Center, [facility] in 2 weeks.  Follow-up with [name] Angel, NP, Stroke Clinic in 6 weeks, office to schedule an appointment.  35 minutes were spent preparing discharge.  Marvel Plan, MD PhD Stroke Neurology 01/02/2017 4:31 PM

## 2017-01-02 NOTE — Progress Notes (Signed)
Physical Therapy Treatment Patient Details Name: [name] [name] MRN: [id number] DOB: 1970/12/06 Today's Date: 01/02/2017    History of Present Illness Pt is a 46 y/o male w/ h/o previous stroke admitted with new onset of symptoms involving his dominant right side. MRI revealed 38mm acute infarct in posterior limb left internal capsule. PMH includes: CVA, HTN, hyperlipidemia, DM, smoker, UTI.    PT Comments    Progressing steadily.  Emphasis on normal movement to complete roll, transition to sit, sitting balance, standing balance.    Follow Up Recommendations  CIR     Equipment Recommendations  Wheelchair (measurements PT);Wheelchair cushion (measurements PT)    Recommendations for Other Services       Precautions / Restrictions Precautions Precautions: Fall    Mobility  Bed Mobility Overal bed mobility: Needs Assistance Bed Mobility: Rolling;Sidelying to Sit;Sit to Supine Rolling: Min assist Sidelying to sit: Mod assist;+2 for physical assistance (x2)   Sit to supine: Mod assist;+2 for physical assistance   General bed mobility comments: Cued for sequencing for best roll without using the rail and having pt work to get both knees flexed.  Cued for assist with R LE.  Assist trunk to roll and come up with R side shortening.  Transfers Overall transfer level: Needs assistance Equipment used: 2 person hand held assist (used chair back) Transfers: Sit to/from Stand Sit to Stand: Mod assist;+2 physical assistance         General transfer comment: Worked on coming forward until pt able to [facility] then with knee supported assisted boost up to upright standing x2  Ambulation/Gait             General Gait Details: unable at this time.    Stairs            Wheelchair Mobility    Modified Rankin (Stroke Patients Only) Modified Rankin (Stroke Patients Only) Pre-Morbid Rankin Score: No significant disability Modified Rankin: Severe disability     Balance  Overall balance assessment: Needs assistance   Sitting balance-Leahy Scale: Poor Sitting balance - Comments: Worked on symmetrical midline sitting, esp, coming into midline from his resting posture slumped to R.  Pt able to inhibit his pushing R with VC's.  Facilitated midline and pt able to hold withou assist and kick out legs in LAQ alternately holding midline.  But any loss of focus and pt falls off R and post. All supported lightly with L UE Postural control: Right lateral lean;Posterior lean Standing balance support: Single extremity supported;Bilateral upper extremity supported Standing balance-Leahy Scale: Zero Standing balance comment: Worked in standing x2 for 3-4 min each working on full upright stance, w/shifts, knee control/balance                            Cognition Arousal/Alertness: Awake/alert Behavior During Therapy: Flat affect;WFL for tasks assessed/performed Overall Cognitive Status: Impaired/Different from baseline Area of Impairment: Following commands;Problem solving                       Following Commands: Follows one step commands consistently              Exercises      General Comments        Pertinent Vitals/Pain Pain Assessment: Faces Faces Pain Scale: No hurt    Home Living                      Prior  Function            PT Goals (current goals can now be found in the care plan section) Acute Rehab PT Goals Patient Stated Goal: get stronger PT Goal Formulation: With patient Time For Goal Achievement: 01/11/17 Potential to Achieve Goals: Good Progress towards PT goals: Progressing toward goals    Frequency    Min 4X/week      PT Plan Current plan remains appropriate    Co-evaluation              AM-PAC PT "6 Clicks" Daily Activity  Outcome Measure  Difficulty turning over in bed (including adjusting bedclothes, sheets and blankets)?: Unable Difficulty moving from lying on back to sitting  on the side of the bed? : Unable Difficulty sitting down on and standing up from a chair with arms (e.g., wheelchair, bedside commode, etc,.)?: Unable Help needed moving to and from a bed to chair (including a wheelchair)?: Total Help needed walking in hospital room?: Total Help needed climbing 3-5 steps with a railing? : Total 6 Click Score: 6    End of Session Equipment Utilized During Treatment: Gait belt Activity Tolerance: Patient tolerated treatment well Patient left: in bed;with call bell/phone within reach;with bed alarm set Nurse Communication: Mobility status;Need for lift equipment PT Visit Diagnosis: Other abnormalities of gait and mobility (R26.89);Difficulty in walking, not elsewhere classified (R26.2);Hemiplegia and hemiparesis Hemiplegia - Right/Left: Right Hemiplegia - dominant/non-dominant: Dominant Hemiplegia - caused by: Cerebral infarction     Time: 1435-1501 PT Time Calculation (min) (ACUTE ONLY): 26 min  Charges:  $Therapeutic Activity: 8-22 mins $Neuromuscular Re-education: 8-22 mins                    G Codes:       2017-01-31  [name] [name], PT [phone]  (pager)   Eliseo Gum Javyn Havlin January 31, 2017, 4:27 PM

## 2017-01-03 ENCOUNTER — Encounter: Payer: Self-pay | Admitting: Adult Health

## 2017-01-03 ENCOUNTER — Non-Acute Institutional Stay (SKILLED_NURSING_FACILITY): Payer: Self-pay | Admitting: Adult Health

## 2017-01-03 DIAGNOSIS — E785 Hyperlipidemia, unspecified: Secondary | ICD-10-CM

## 2017-01-03 DIAGNOSIS — F418 Other specified anxiety disorders: Secondary | ICD-10-CM

## 2017-01-03 DIAGNOSIS — I69359 Hemiplegia and hemiparesis following cerebral infarction affecting unspecified side: Secondary | ICD-10-CM

## 2017-01-03 DIAGNOSIS — I63312 Cerebral infarction due to thrombosis of left middle cerebral artery: Secondary | ICD-10-CM

## 2017-01-03 DIAGNOSIS — E1169 Type 2 diabetes mellitus with other specified complication: Secondary | ICD-10-CM

## 2017-01-03 DIAGNOSIS — Z794 Long term (current) use of insulin: Secondary | ICD-10-CM

## 2017-01-03 DIAGNOSIS — I10 Essential (primary) hypertension: Secondary | ICD-10-CM

## 2017-01-03 DIAGNOSIS — E1149 Type 2 diabetes mellitus with other diabetic neurological complication: Secondary | ICD-10-CM

## 2017-01-03 DIAGNOSIS — E1165 Type 2 diabetes mellitus with hyperglycemia: Secondary | ICD-10-CM

## 2017-01-03 DIAGNOSIS — IMO0002 Reserved for concepts with insufficient information to code with codable children: Secondary | ICD-10-CM

## 2017-01-03 NOTE — Progress Notes (Signed)
Location:   [facility] Room Number: 119 Place of Service:  SNF (31)   CODE STATUS: Full Code  Allergies  Allergen Reactions  . Penicillins Other (See Comments)    Has patient had a PCN reaction causing immediate rash, facial/tongue/throat swelling, SOB or lightheadedness with hypotension: Yes Has patient had a PCN reaction causing severe rash involving mucus membranes or skin necrosis: No Has patient had a PCN reaction that required hospitalization: No Has patient had a PCN reaction occurring within the last 10 years: No If all of the above answers are "NO", then may proceed with Cephalosporin use.     Chief Complaint  Patient presents with  . Hospitalization Follow-up    Hospital Follow up    HPI:  He has a history of smoking; diabetes and hypertension; who was hospitalized for an acute cva at left PLIC, likely small vessel due to poorly controlled diabetes and hyperlipidemia.  He has moderate dysarthria; right facial droop; has right sided weakness; and dysphagia. He is here for short term rehab with his goal to return back home. He is unable to fully participate in the hpi or ros but does tell me that he has no pain; no choking; and no emotional changes.   His goals are to walk a short distance and to become continent. There are no nursing concerns at this time.   Past Medical History:  Diagnosis Date  . Diabetes mellitus without complication (HCC)   . Hypertension   . Stroke [facility])     Past Surgical History:  Procedure Laterality Date  . APPENDECTOMY    . BACK SURGERY      Social History   Social History  . Marital status: Legally Separated    Spouse name: N/A  . Number of children: N/A  . Years of education: N/A   Occupational History  . Not on file.   Social History Main Topics  . Smoking status: Current Every Day Smoker  . Smokeless tobacco: Never Used  . Alcohol use No  . Drug use: No  . Sexual activity: Not on file   Other Topics  Concern  . Not on file   Social History Narrative  . No narrative on file   History reviewed. No pertinent family history.    VITAL SIGNS BP 121/74   Pulse 63   Temp 98 F (36.7 C)   Resp 16   Ht  (1.753 m)   Wt 179 lb 11.2 oz (81.5 kg)   SpO2 94%   BMI 26.54 kg/m   Patient's Medications  New Prescriptions   No medications on file  Previous Medications   AMLODIPINE (NORVASC) 10 MG TABLET    Take 10 mg by mouth at bedtime.    ATENOLOL (TENORMIN) 50 MG TABLET    Take 50 mg by mouth at bedtime.    ATORVASTATIN (LIPITOR) 80 MG TABLET    Take 80 mg by mouth daily.   CHLORTHALIDONE (HYGROTON) 50 MG TABLET    Take 50 mg by mouth daily.   CITALOPRAM (CELEXA) 40 MG TABLET    Take 40 mg by mouth daily.   CLOPIDOGREL (PLAVIX) 75 MG TABLET    Take 1 tablet (75 mg total) by mouth daily.   FOLIC ACID (FOLVITE) 1 MG TABLET    Take 1 mg by mouth daily.   INSULIN ASPART (NOVOLOG) 100 UNIT/ML INJECTION    Inject 5 Units into the skin 3 (three) times daily with meals.   INSULIN GLARGINE (  LANTUS) 100 UNIT/ML INJECTION    Inject 20 Units into the skin at bedtime.   INSULIN LISPRO (HUMALOG) 100 UNIT/ML INJECTION    Inject 0.2 mLs (20 Units total) into the skin 3 (three) times daily.   LISINOPRIL (PRINIVIL,ZESTRIL) 40 MG TABLET    Take 40 mg by mouth daily.   SERTRALINE (ZOLOFT) 100 MG TABLET    Take 100 mg by mouth daily.  Modified Medications   No medications on file  Discontinued Medications   No medications on file     SIGNIFICANT DIAGNOSTIC EXAMS  TODAY:   12-25-16: ct of head:  1. No acute cortically based infarct or acute intracranial hemorrhage identified. 2. ASPECTS is 10. 3. Markedly advanced cerebral volume loss and new evidence of bilateral small vessel ischemia since a prior head CT in 2012.Marland Kitchen  12-25-16: ct angio of head and neck:  1. Negative for emergent large vessel occlusion but positive for severe stenosis of the right PCA P1 and P2 segments.. 2. No arterial  occlusion identified. Mild or up to moderate irregularity of other circle of Willis branches, including the left PCA and left MCA. 3. No Basilar Artery or extracranial atherosclerosis or stenosis. 4. No acute findings in the neck.  Prior ACDF.  12-26-16: ct of head:  1. No acute intracranial hemorrhage status post tPA. 2. Evolving 9 mm acute ischemic infarct at the posterior limb of the left internal capsule, stable from recent MRI. 3. No other new acute intracranial process.  12-26-16: 2-d echo:   - Left ventricle: The cavity size was normal. There was mild concentric hypertrophy. Systolic function was normal. The estimated ejection fraction was in the range of 50% to 55%. Wall motion was normal; there were no regional wall motion abnormalities. Doppler parameters are consistent with abnormal left ventricular relaxation (grade 1 diastolic dysfunction)  Doppler parameters are consistent with indeterminate ventricular filling pressure. - Aortic valve: Transvalvular velocity was within the normal range. There was no stenosis. There was no regurgitation. - Mitral valve: Transvalvular velocity was within the normal range.There was no evidence for stenosis. There was no regurgitation. - Right ventricle: The cavity size was normal. Wall thickness was normal. Systolic function was normal. - Atrial septum: No defect or patent foramen ovale was identified. - Tricuspid valve: There was trivial regurgitation. - Pulmonary arteries: Systolic pressure was within the normal range.  12-26-16: mri brain:  1. 9 mm acute infarct in the posterior limb left internal capsule. 2. Chronic small vessel ischemia with multiple remote lacunar infarcts, extensive for age.  12-28-16: swallow study:  nectar thick liquids   LABS REVIEWED TODAY:   12-25-16: wbc 9.4; hgb 15.6; hct 42.7; mcv 81.8; plt 249; glucose 398; bun 9; creat 1.02; k+ 3.2; na++ 135; ca 9.9 liver normal albumin 3.8 12-26-16: glucose 363; bun 10; creat 1.02; k+  3.5; na++ 136; ca 9.9  liver normal albumin 3.6; hgb a1c 12.3; chol 222; trig 439; hdl 39 12-31-16: wbc 9.7; hgb 14.7; hct 42.0; mcv 84.0; plt 251; glucose 172; bun 10; creat 0;96 k+ 3.2 ;na++ 137; ca 9.4  01-02-17: wbc 9.7; hgb 14.5; hct 41.7 ;mcv 84.8; plt 257; glucose 145; bun 13; creat 1.15; k+ 3.5; na++137; ca 9.9; mag 1.8  Review of Systems  Reason unable to perform ROS: difficult due to dysarthia    Constitutional: Negative for malaise/fatigue.  Respiratory: Negative for cough.   Cardiovascular: Negative for chest pain.  Gastrointestinal: Negative for abdominal pain.  Musculoskeletal: Negative for myalgias.  Psychiatric/Behavioral: The  patient is not nervous/anxious.      Physical Exam  Constitutional: He appears well-developed and well-nourished. No distress.  HENT:  Head: Normocephalic.  Eyes: Conjunctivae are normal.  Neck: Neck supple. No thyromegaly present.  Cardiovascular: Normal rate, regular rhythm, normal heart sounds and intact distal pulses.   Pulmonary/Chest: Effort normal and breath sounds normal. No respiratory distress.  Abdominal: Soft. Bowel sounds are normal. He exhibits no distension.  Musculoskeletal: He exhibits no edema.  Right facial weakness Right side weakness   Lymphadenopathy:    He has no cervical adenopathy.  Neurological: He is alert.  Skin: Skin is warm and dry. He is not diaphoretic.    ASSESSMENT/ PLAN:  TODAY:   1. Hypertension: stable b/p: 121/74; will continue norvasc 10 mg daily; tenormin 50 mg nightly ; lisinopril 40 mg daily and hygroton 50 mg daily   2.  Diabetes: unchanged hgb a1c 12.3 will stop humalog and will keep novolog and will increase to 10 units after meals and lantus 20 units nightly   3. Dyslipidemia: no change in status: trig 439; hdl 39: will continue lipitor 80 mg daily   4. Acute CVA at left PLIC: is neuologicaly stable; has right side hemiparesis:  will continue therapy as directed to improve upon his independence  wit his adls: will continue plavix 75 mg daily   5. Depression with anxiety: is stable; will continue zoloft 100 mg daily and will stop celexa; will monitor    MD is aware of resident's narcotic use and is in agreement with current plan of care. We will attempt to wean resident as apropriate   Synthia Innocent NP [facility]  Contact [phone] Monday through Friday 8am- 5pm  After hours call [phone]

## 2017-01-04 ENCOUNTER — Non-Acute Institutional Stay (SKILLED_NURSING_FACILITY): Payer: Self-pay | Admitting: Internal Medicine

## 2017-01-04 ENCOUNTER — Encounter: Payer: Self-pay | Admitting: Internal Medicine

## 2017-01-04 DIAGNOSIS — E1159 Type 2 diabetes mellitus with other circulatory complications: Secondary | ICD-10-CM

## 2017-01-04 DIAGNOSIS — I69322 Dysarthria following cerebral infarction: Secondary | ICD-10-CM

## 2017-01-04 DIAGNOSIS — I69359 Hemiplegia and hemiparesis following cerebral infarction affecting unspecified side: Secondary | ICD-10-CM

## 2017-01-04 DIAGNOSIS — Z8673 Personal history of transient ischemic attack (TIA), and cerebral infarction without residual deficits: Secondary | ICD-10-CM

## 2017-01-04 DIAGNOSIS — Z794 Long term (current) use of insulin: Secondary | ICD-10-CM

## 2017-01-04 DIAGNOSIS — I10 Essential (primary) hypertension: Secondary | ICD-10-CM

## 2017-01-04 DIAGNOSIS — Z720 Tobacco use: Secondary | ICD-10-CM

## 2017-01-04 DIAGNOSIS — L231 Allergic contact dermatitis due to adhesives: Secondary | ICD-10-CM

## 2017-01-04 DIAGNOSIS — E782 Mixed hyperlipidemia: Secondary | ICD-10-CM

## 2017-01-04 NOTE — Telephone Encounter (Signed)
LMOVM for device clinic to release patient in [facility]link.

## 2017-01-04 NOTE — Progress Notes (Signed)
Patient ID: [name] [name], male   DOB: [birth year], 46 y.o.   MRN: [id number]     HISTORY AND PHYSICAL   DATE:  January 04, 2017  Location:   [facility] Room Number: 119 Place of Service: SNF (31)   Extended Emergency Contact Information Primary Emergency Contact: Dickerson,Debbie  [place] Home Phone: [phone] Mobile Phone: [phone] Relation: Sister  Advanced Directive information Does Patient Have a Medical Advance Directive?: No, Would patient like information on creating a medical advance directive?: No - Patient declined  Chief Complaint  Patient presents with  . New Admit To SNF    Admission    HPI:  46 yo male seen today as a new admission into SNF following hospital stay for acute left PLIC CVA, DM, HTN. He presented to the ED with slurred speech, difficulty walking and speaking. CT head initially neg for acute process. MRI brain revealed 9mm acute infarct in posterior limb of internal capsule; chronic small vessel ischemia with multiple remote old lacunar infarcts extensive for age. TTE showed EF 50-55%. CTA head and neck revealed severe right P1 and P2 segment stenosis. LDL uncalculable 2/2 TG >400. A1c 12.3%. He is a pt at [facility]. He presents to SNF for short term rehab.  Today he reports no concerns. He has RUE>RLE weakness and slurred speech. No dysphagia. No nursing issues. No falls but he is c/a gait issues. Appetite ok. Sleeps well. He is a poor historian due to dysarthria. Hx obtained from char.  HTN - stable on amlodipine, atenolol,chlothalidone, lisinopril  Hyperlipidemia - uncontrolled. He is on lipitor  qhs. 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 He takes statin and plavix. He req'd tPA for CVA in March 2017; also had implantable loop recorder during that time  DM - uncontrolled. Takes lantus and novolog. He is on ACEI and  statin tx. A1c 12.3% (previously 13.3%)  Anxiety - mood stable on citalopram. 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 Steroids caused worsening BS and med stopped. He was to f/u with rheumatology. Initially presented with palpable purpura, abdominal pain and arthralgias. He takes folate  Hx medical noncompliance   Past Medical History:  Diagnosis Date  . Diabetes mellitus without complication (HCC)   . Hypertension   . Stroke [facility])     Past Surgical History:  Procedure Laterality Date  . APPENDECTOMY    . BACK SURGERY      Patient Care Team: [facility] as PCP - General (General Practice)  Social History   Social History  . Marital status: Legally Separated    Spouse name: N/A  . Number of children: N/A  . Years of education: N/A   Occupational History  . Not on file.   Social History Main Topics  . Smoking status: Current Every Day Smoker  . Smokeless tobacco: Never Used  . Alcohol use No  . Drug use: No  . Sexual activity: Not on file   Other Topics Concern  . Not on file   Social History Narrative  . No narrative on file     reports that he has been smoking.  He has never used smokeless tobacco. He reports that he does not drink alcohol or use drugs.  History reviewed. No pertinent family history. No family status information on file.  Immunization History  Administered Date(s) Administered  . Pneumococcal Polysaccharide-23 12/27/2016  . Tdap 04/29/2014    Allergies  Allergen Reactions  . Penicillins Other (See Comments)    Has patient had a PCN reaction causing immediate rash, facial/tongue/throat swelling, SOB or lightheadedness with hypotension: Yes Has patient had a PCN reaction causing severe rash involving mucus membranes or skin necrosis: No Has patient had a PCN reaction that required hospitalization: No Has patient had a PCN reaction occurring within  the last 10 years: No If all of the above answers are "NO", then may proceed with Cephalosporin use.     Medications: Patient's Medications  New Prescriptions   No medications on file  Previous Medications   AMLODIPINE (NORVASC) 10 MG TABLET    Take 10 mg by mouth at bedtime.    ATENOLOL (TENORMIN) 50 MG TABLET    Take 50 mg by mouth at bedtime.    ATORVASTATIN (LIPITOR) 80 MG TABLET    Take 80 mg by mouth daily.   CHLORTHALIDONE (HYGROTON) 50 MG TABLET    Take 50 mg by mouth daily.   CITALOPRAM (CELEXA) 40 MG TABLET    Take 40 mg by mouth daily.   CLOPIDOGREL (PLAVIX) 75 MG TABLET    Take 1 tablet (75 mg total) by mouth daily.   FOLIC ACID (FOLVITE) 1 MG TABLET    Take 1 mg by mouth daily.   INSULIN ASPART (NOVOLOG) 100 UNIT/ML INJECTION    Inject 10 Units into the skin 3 (three) times daily before meals.   INSULIN GLARGINE (LANTUS) 100 UNIT/ML INJECTION    Inject 20 Units into the skin at bedtime.   LISINOPRIL (PRINIVIL,ZESTRIL) 40 MG TABLET    Take 40 mg by mouth daily.   SERTRALINE (ZOLOFT) 100 MG TABLET    Take 100 mg by mouth daily.  Modified Medications   No medications on file  Discontinued Medications   INSULIN ASPART (NOVOLOG) 100 UNIT/ML INJECTION    Inject 5 Units into the skin 3 (three) times daily with meals.   INSULIN LISPRO (HUMALOG) 100 UNIT/ML INJECTION    Inject 0.2 mLs (20 Units total) into the skin 3 (three) times daily.    Review of Systems  Musculoskeletal: Positive for gait problem.  Neurological: Positive for speech difficulty and weakness.  All other systems reviewed and are negative.   Vitals:   01/04/17 1000  BP: 121/74  Pulse: 63  Resp: 16  Temp: 98 F (36.7 C)  SpO2: 94%  Weight: 178 lb 12.8 oz (81.1 kg)  Height:  (1.753 m)   Body mass index is 26.4 kg/m.  Physical Exam  Constitutional: He is oriented to person, place, and time. He appears well-developed and well-nourished.  Sitting up in bed in NAD  HENT:  Mouth/Throat:  Oropharynx is clear and moist.  Tongue deviates to right on protrusion  Eyes: Pupils are equal, round, and reactive to light. No scleral icterus.  Neck: Neck supple. Carotid bruit is present (right). No thyromegaly present.  Cardiovascular: Normal rate, regular rhythm and intact distal pulses.  Exam reveals no gallop and no friction rub.   Murmur (1/6 SEM) heard. Trace RUE swelling but no LE swelling. No calf TTP  Pulmonary/Chest: Effort normal and breath sounds normal. He has no wheezes. He has no rales. He exhibits no tenderness.  Abdominal: Soft. Normal appearance and bowel sounds are normal. He exhibits no distension, no abdominal bruit, no pulsatile midline mass and no mass. There is no hepatomegaly. There  is no tenderness. There is no rigidity, no rebound and no guarding. No hernia.  Musculoskeletal: He exhibits edema.  Lymphadenopathy:    He has no cervical adenopathy.  Neurological: He is alert and oriented to person, place, and time.  No grip strength on right; intact grip on left; RLE 3/5 strength; intact on LLE  Skin: Skin is warm and dry. Rash (facial seborrhea dermatitis to include eyebrows and beard. (+) rosacea; right forearm local red reaction consists of open vesicles) noted.  Psychiatric: He has a normal mood and affect. His behavior is normal. Judgment and thought content normal.     Labs reviewed: Admission on 12/25/2016, Discharged on 01/02/2017  No results displayed because visit has over 200 results.  CBC Latest Ref Rng & Units 01/02/2017 12/31/2016 12/27/2016  WBC 4.0 - 10.5 K/uL 9.7 9.7 10.7(H)  Hemoglobin 13.0 - 17.0 g/dL 30.8 65.7 84.6  Hematocrit 39.0 - 52.0 % 41.7 42.0 41.5  Platelets 150 - 400 K/uL 257 251 271   Lab Results  Component Value Date   HGBA1C 12.3 (H) 12/26/2016   CMP Latest Ref Rng & Units 01/02/2017 01/01/2017 12/31/2016  Glucose 65 - 99 mg/dL 962(X) 528(U) 132(G)  BUN 6 - 20 mg/dL Creatinine 0.61 - 1.24 mg/dL 4.01 0.27 2.53  Sodium 135  - 145 mmol/L 137 137 137  Potassium 3.5 - 5.1 mmol/L 3.5 3.2(L) 3.2(L)  Chloride 101 - 111 mmol/L 99(L) 100(L) 100(L)  CO2 22 - 32 mmol/L 32 28 28  Calcium 8.9 - 10.3 mg/dL 9.9 9.5 9.4  Total Protein 6.5 - 8.1 g/dL - - -  Total Bilirubin 0.3 - 1.2 mg/dL - - -  Alkaline Phos 38 - 126 U/L - - -  AST 15 - 41 U/L - - -  ALT 17 - 63 U/L - - -   Lipid Panel     Component Value Date/Time   CHOL 222 (H) 12/26/2016 0226   TRIG 439 (H) 12/26/2016 0226   HDL 39 (L) 12/26/2016 0226   CHOLHDL 5.7 12/26/2016 0226   VLDL UNABLE TO CALCULATE IF TRIGLYCERIDE OVER 400 mg/dL 66/44/0347 4259   LDLCALC UNABLE TO CALCULATE IF TRIGLYCERIDE OVER 400 mg/dL 56/38/7564 3329       Ct Angio Head W Or Wo Contrast  Result Date: 12/25/2016 CLINICAL DATA:  46 year old male code stroke with ataxia. EXAM: CT ANGIOGRAPHY HEAD AND NECK TECHNIQUE: Multidetector CT imaging of the head and neck was performed using the standard protocol during bolus administration of intravenous contrast. Multiplanar CT image reconstructions and MIPs were obtained to evaluate the vascular anatomy. Carotid stenosis measurements (when applicable) are obtained utilizing NASCET criteria, using the distal internal carotid diameter as the denominator. CONTRAST:  50 mL Isovue 370 COMPARISON:  CT head without contrast 1918 hours today. FINDINGS: CTA NECK Skeleton: Prior C3-C4 ACDF with solid arthrodesis. No acute osseous abnormality identified. Upper chest: Negative lung apices. No superior mediastinal lymphadenopathy. Other neck: Negative; bilateral cervical lymph nodes are within normal limits. Aortic arch: 3 vessel arch configuration. Minimal arch atherosclerosis. Right carotid system: Negative. Left carotid system: Negative. Vertebral arteries: No proximal right subclavian artery plaque or stenosis. Normal right vertebral artery origin. Normal right vertebral artery to the skullbase. Mild soft plaque at the left subclavian artery origin without  stenosis. Normal left vertebral artery origin. The left vertebral artery is mildly dominant and normal to the skullbase. CTA HEAD Posterior circulation: Mildly dominant distal left vertebral artery. Normal left PICA origin. No distal  vertebral stenosis. Patent vertebrobasilar junction and dominant appearing right AICA origin. No basilar stenosis. Patent SCA and PCA origins. Posterior communicating arteries are diminutive or absent. Rapid tapering of the right PCA with severe stenosis in the distal P1 and P2 segment (series 10, image 19). Preserved distal enhancement, but P3 branch irregularity. Mild to moderate irregularity and stenosis of the contralateral left PCA P1 and P2 segments. Anterior circulation: Patent ICA siphons with no siphon plaque or stenosis. Normal ophthalmic artery origins. Patent carotid termini. Normal MCA and ACA origins. Anterior communicating artery and bilateral ACA branches are within normal limits. Left MCA M1 segment is mildly irregular but without stenosis. Patent left MCA trifurcation. Left MCA branches are within normal limits. Right MCA M1 segment and right MCA bifurcation are patent without stenosis. Right MCA branches are within normal limits. Venous sinuses: Patent. Anatomic variants: Mildly dominant left vertebral artery. Review of the MIP images confirms the above findings IMPRESSION: 1. Negative for emergent large vessel occlusion but positive for severe stenosis of the right PCA P1 and P2 segments. This was reviewed in person with Dr. Ritta Slot on 12/25/2016 at 1940 hours. 2. No arterial occlusion identified. Mild or up to moderate irregularity of other circle of Willis branches, including the left PCA and left MCA. 3. No Basilar Artery or extracranial atherosclerosis or stenosis. 4. No acute findings in the neck.  Prior ACDF. Electronically Signed   By: Odessa Fleming M.D.   On: 12/25/2016 19:57   Ct Head Wo Contrast  Result Date: 12/26/2016 CLINICAL DATA:  Follow-up  examination status post tPA. EXAM: CT HEAD WITHOUT CONTRAST TECHNIQUE: Contiguous axial images were obtained from the base of the skull through the vertex without intravenous contrast. COMPARISON:  Prior CT from 12/25/2016. FINDINGS: Brain: Cerebral atrophy with chronic microvascular ischemic disease, stable. Evolving 9 mm acute lacunar type infarction at the posterior limb of the left internal capsule, stable from recent MRI. No acute intracranial hemorrhage status post tPA. No new acute large vessel territory infarct. No mass lesion, midline shift or mass effect. No hydrocephalus. No extra-axial fluid collection. Vascular: No asymmetric hyperdense vessel. Intracranial atherosclerosis again noted. Skull: Scalp soft tissues and calvarium within normal limits. Sinuses/Orbits: Globes and orbital soft tissues within normal limits. Scattered mucosal thickening within the ethmoidal air cells. Paranasal sinuses are otherwise clear. No mastoid effusion. Other: None. IMPRESSION: 1. No acute intracranial hemorrhage status post tPA. 2. Evolving 9 mm acute ischemic infarct at the posterior limb of the left internal capsule, stable from recent MRI. 3. No other new acute intracranial process. Electronically Signed   By: Rise Mu M.D.   On: 12/26/2016 21:26   Ct Head Wo Contrast  Result Date: 12/25/2016 CLINICAL DATA:  46 y/o M; sudden onset of slurred speech and right-sided weakness. EXAM: CT HEAD WITHOUT CONTRAST TECHNIQUE: Contiguous axial images were obtained from the base of the skull through the vertex without intravenous contrast. COMPARISON:  CTA and CT angiogram of the head dated 12/25/2016. FINDINGS: Brain: No large acute infarct or intracranial hemorrhage identified. Multiple stable foci of hypoattenuation in subcortical and periventricular white matter, likely represent chronic microvascular ischemic changes in the setting of diabetes hypertension. Small lucencies are present in the right caudate head,  bile lentiform nucleus, bilateral thalamus, right mid corona radiata, and the left pons compatible with chronic lacunar infarctions. Stable brain parenchymal volume loss. Vascular: No hyperdense vessel or unexpected calcification. Skull: Normal. Negative for fracture or focal lesion. Sinuses/Orbits: No acute finding. Other: None. IMPRESSION:  1. No acute intracranial abnormality identified. 2. Stable advanced chronic microvascular ischemic changes of the brain for age with multiple chronic lacunar infarcts in basal ganglia and the pons. Electronically Signed   By: Mitzi Hansen M.D.   On: 12/25/2016 23:49   Ct Angio Neck W And/or Wo Contrast  Result Date: 12/25/2016 CLINICAL DATA:  46 year old male code stroke with ataxia. EXAM: CT ANGIOGRAPHY HEAD AND NECK TECHNIQUE: Multidetector CT imaging of the head and neck was performed using the standard protocol during bolus administration of intravenous contrast. Multiplanar CT image reconstructions and MIPs were obtained to evaluate the vascular anatomy. Carotid stenosis measurements (when applicable) are obtained utilizing NASCET criteria, using the distal internal carotid diameter as the denominator. CONTRAST:  50 mL Isovue 370 COMPARISON:  CT head without contrast 1918 hours today. FINDINGS: CTA NECK Skeleton: Prior C3-C4 ACDF with solid arthrodesis. No acute osseous abnormality identified. Upper chest: Negative lung apices. No superior mediastinal lymphadenopathy. Other neck: Negative; bilateral cervical lymph nodes are within normal limits. Aortic arch: 3 vessel arch configuration. Minimal arch atherosclerosis. Right carotid system: Negative. Left carotid system: Negative. Vertebral arteries: No proximal right subclavian artery plaque or stenosis. Normal right vertebral artery origin. Normal right vertebral artery to the skullbase. Mild soft plaque at the left subclavian artery origin without stenosis. Normal left vertebral artery origin. The left  vertebral artery is mildly dominant and normal to the skullbase. CTA HEAD Posterior circulation: Mildly dominant distal left vertebral artery. Normal left PICA origin. No distal vertebral stenosis. Patent vertebrobasilar junction and dominant appearing right AICA origin. No basilar stenosis. Patent SCA and PCA origins. Posterior communicating arteries are diminutive or absent. Rapid tapering of the right PCA with severe stenosis in the distal P1 and P2 segment (series 10, image 19). Preserved distal enhancement, but P3 branch irregularity. Mild to moderate irregularity and stenosis of the contralateral left PCA P1 and P2 segments. Anterior circulation: Patent ICA siphons with no siphon plaque or stenosis. Normal ophthalmic artery origins. Patent carotid termini. Normal MCA and ACA origins. Anterior communicating artery and bilateral ACA branches are within normal limits. Left MCA M1 segment is mildly irregular but without stenosis. Patent left MCA trifurcation. Left MCA branches are within normal limits. Right MCA M1 segment and right MCA bifurcation are patent without stenosis. Right MCA branches are within normal limits. Venous sinuses: Patent. Anatomic variants: Mildly dominant left vertebral artery. Review of the MIP images confirms the above findings IMPRESSION: 1. Negative for emergent large vessel occlusion but positive for severe stenosis of the right PCA P1 and P2 segments. This was reviewed in person with Dr. Ritta Slot on 12/25/2016 at 1940 hours. 2. No arterial occlusion identified. Mild or up to moderate irregularity of other circle of Willis branches, including the left PCA and left MCA. 3. No Basilar Artery or extracranial atherosclerosis or stenosis. 4. No acute findings in the neck.  Prior ACDF. Electronically Signed   By: Odessa Fleming M.D.   On: 12/25/2016 19:57   Mr Brain Wo Contrast  Result Date: 12/26/2016 CLINICAL DATA:  Slurred speech with facial droop. Status post tPA. History of  diabetes and hypertension. EXAM: MRI HEAD WITHOUT CONTRAST TECHNIQUE: Multiplanar, multiecho pulse sequences of the brain and surrounding structures were obtained without intravenous contrast. COMPARISON:  Head CT and CTA from yesterday FINDINGS: Brain: 9 mm ovoid acute infarct in the posterior limb left internal capsule. There is advanced chronic ischemic injury, especially for age, with nearly confluent the periventricular gliotic signal and chronic lacunes  in the pons, bilateral thalamus, and bilateral deep white matter tracts. T2 hyperintensity in the bilateral middle cerebellar peduncle is symmetric and likely wallerian changes. Overall pattern history not typical for demyelination. The pons and cerebellum is atrophic. Small remote micro hemorrhages seen in the brainstem along the left cerebellum. No acute hemorrhage, hydrocephalus, or masslike findings. Vascular: Recent CTA.  Major flow voids are preserved. Skull and upper cervical spine: Negative for marrow lesion Sinuses/Orbits: Negative IMPRESSION: 1. 9 mm acute infarct in the posterior limb left internal capsule. 2. Chronic small vessel ischemia with multiple remote lacunar infarcts, extensive for age. Electronically Signed   By: Marnee Spring M.D.   On: 12/26/2016 14:41   Dg Swallowing Func-speech Pathology  Result Date: 12/28/2016 Objective Swallowing Evaluation: Type of Study: MBS-Modified Barium Swallow Study Patient Details Name: [name] [name] MRN: [id number] Date of Birth: 03-16-1971 Today's Date: 12/28/2016 Time: SLP Start Time (ACUTE ONLY): 0855-SLP Stop Time (ACUTE ONLY): 0913 SLP Time Calculation (min) (ACUTE ONLY): 18 min Past Medical History: Past Medical History: Diagnosis Date . Diabetes mellitus without complication (HCC)  . Hypertension  . Stroke [facility])  Past Surgical History: Past Surgical History: Procedure Laterality Date . APPENDECTOMY   . BACK SURGERY   HPI: [name] [name] a 46 y.o.malewho was last seen well 6:30 PM which time he  began experiencing slurred speech, difficult walking, facial droop. He has a history of diabetes and hypertension and smokes 2-3 cigarettes per day. After arrival, he was taken for a stat head CT which was negative, was within the time window for TPA he received  No Data Recorded Assessment / Plan / Recommendation CHL IP CLINICAL IMPRESSIONS 12/28/2016 Clinical Impression MBS provided clearer results of swallow function. Pts oral manipulation of solids and strength and presence of residuals much improved this session. This is likely secondary to decreased standing secretions and general strength and improved participation rather than dramatic change in pharyngeal strength over just two days. Timing of swallow initiation is still a significant barrier to safety with thin liquids. Despite cueing for a chin tuck, breath hold and hold and swallow strategy, pt aspirated thin liquids before/during the swallow due to delayed initiation. Penetration was silent, but aspiration was sensed within 3 seconds of swallow. There were instances of timely initiation and tolerance with small sips but pt was not consistent. Recommend an upgrade to dys 3 solids and nectar thick liquids with an intermittent throat clear to reduce silent penetration events. Cues to hold sip orally and swallow with control to increase awareness were helpful. Would consider upgrade to water protocol in the future as pt continues to improve. Recommend CIR at d/c.  SLP Visit Diagnosis Dysphagia, oropharyngeal phase (R13.12) Attention and concentration deficit following -- Frontal lobe and executive function deficit following -- Impact on safety and function Moderate aspiration risk   CHL IP TREATMENT RECOMMENDATION 12/28/2016 Treatment Recommendations Therapy as outlined in treatment plan below   Prognosis 12/28/2016 Prognosis for Safe Diet Advancement Good Barriers to Reach Goals -- Barriers/Prognosis Comment -- CHL IP DIET RECOMMENDATION 12/28/2016 SLP Diet  Recommendations Dysphagia 3 (Mech soft) solids;Nectar thick liquid Liquid Administration via Cup Medication Administration Whole meds with puree Compensations Slow rate;Small sips/bites;Multiple dry swallows after each bite/sip Postural Changes Seated upright at 90 degrees   CHL IP OTHER RECOMMENDATIONS 12/28/2016 Recommended Consults -- Oral Care Recommendations Oral care BID Other Recommendations Order thickener from pharmacy   CHL IP FOLLOW UP RECOMMENDATIONS 12/28/2016 Follow up Recommendations Inpatient Rehab   CHL IP  FREQUENCY AND DURATION 12/28/2016 Speech Therapy Frequency (ACUTE ONLY) min 2x/week Treatment Duration 2 weeks      CHL IP ORAL PHASE 12/28/2016 Oral Phase WFL Oral - Pudding Teaspoon -- Oral - Pudding Cup -- Oral - Honey Teaspoon -- Oral - Honey Cup NT Oral - Nectar Teaspoon -- Oral - Nectar Cup WFL Oral - Nectar Straw -- Oral - Thin Teaspoon -- Oral - Thin Cup -- Oral - Thin Straw -- Oral - Puree WFL Oral - Mech Soft -- Oral - Regular -- Oral - Multi-Consistency -- Oral - Pill -- Oral Phase - Comment --  CHL IP PHARYNGEAL PHASE 12/28/2016 Pharyngeal Phase Impaired Pharyngeal- Pudding Teaspoon -- Pharyngeal -- Pharyngeal- Pudding Cup -- Pharyngeal -- Pharyngeal- Honey Teaspoon -- Pharyngeal -- Pharyngeal- Honey Cup NT Pharyngeal -- Pharyngeal- Nectar Teaspoon -- Pharyngeal -- Pharyngeal- Nectar Cup Delayed swallow initiation-pyriform sinuses;Penetration/Aspiration before swallow;Penetration/Aspiration during swallow Pharyngeal Material enters airway, CONTACTS cords and then ejected out Pharyngeal- Nectar Straw Penetration/Aspiration before swallow;Penetration/Aspiration during swallow Pharyngeal Material enters airway, CONTACTS cords and not ejected out;Material enters airway, remains ABOVE vocal cords and not ejected out Pharyngeal- Thin Teaspoon -- Pharyngeal -- Pharyngeal- Thin Cup Penetration/Aspiration before swallow;Penetration/Aspiration during swallow;Moderate aspiration Pharyngeal Material enters  airway, passes BELOW cords and not ejected out despite cough attempt by patient Pharyngeal- Thin Straw Penetration/Aspiration before swallow;Penetration/Aspiration during swallow Pharyngeal Material enters airway, passes BELOW cords and not ejected out despite cough attempt by patient;Material enters airway, passes BELOW cords without attempt by patient to eject out (silent aspiration) Pharyngeal- Puree WFL Pharyngeal -- Pharyngeal- Mechanical Soft -- Pharyngeal -- Pharyngeal- Regular WFL Pharyngeal -- Pharyngeal- Multi-consistency -- Pharyngeal -- Pharyngeal- Pill -- Pharyngeal -- Pharyngeal Comment --  No flowsheet data found. No flowsheet data found. [name] [name], [name] [name] 12/28/2016, 9:41 AM              Ct Head Code Stroke Wo Contrast  Result Date: 12/25/2016 CLINICAL DATA:  Code stroke. 46 year old male with right side weakness. Last seen normal 1830 hours. EXAM: CT HEAD WITHOUT CONTRAST TECHNIQUE: Contiguous axial images were obtained from the base of the skull through the vertex without intravenous contrast. COMPARISON:  Head CT 01/15/2011. FINDINGS: Brain: Age advanced cerebral volume loss, significantly progressed since 2012. New patchy scattered bilateral cerebral white matter hypodensity. 3-4 mm hypodensity in the medial left thalamus. Chronic appearing lacunar infarct in the right lentiform. No acute intracranial hemorrhage identified. No midline shift, mass effect, or evidence of intracranial mass lesion. Ex vacuo appearing ventricular enlargement is new since 2012. No cortically based acute infarct identified. Vascular: Mild Calcified atherosclerosis at the skull base. No suspicious intracranial vascular hyperdensity. Skull: Negative. No acute osseous abnormality identified. Stable appearing C3-C4 ACDF changes on the scout view today and in 2012. Sinuses/Orbits: Visualized paranasal sinuses and mastoids are stable and well pneumatized. Other: Visualized orbit soft tissues are within normal limits.  Visualized scalp soft tissues are within normal limits. ASPECTS [facility] Stroke Program Early CT Score) - Ganglionic level infarction (caudate, lentiform nuclei, internal capsule, insula, M1-M3 cortex): 7 - Supraganglionic infarction (M4-M6 cortex): 3 Total score (0-10 with 10 being normal): 10 IMPRESSION: 1. No acute cortically based infarct or acute intracranial hemorrhage identified. 2. ASPECTS is 10. 3. Markedly advanced cerebral volume loss and new evidence of bilateral small vessel ischemia since a prior head CT in 2012. 4. The above was relayed via text pager to Dr. Ritta Slot on 12/25/2016 at 19:27 . Electronically Signed   By: Althea Grimmer.D.  On: 12/25/2016 19:28     Assessment/Plan   ICD-10-CM   1. Hemiparesis of dominant side due to recent cerebrovascular accident (CVA) (HCC) I69.359   2. Dysarthria due to recent cerebrovascular accident (CVA) Z61.096   3. Recent cerebrovascular accident (CVA) Z86.73   4. Type 2 diabetes mellitus with other circulatory complication, with long-term current use of insulin (HCC) E11.59    Z79.4   5. Mixed hyperlipidemia E78.2   6. Essential hypertension I10   7. Tobacco abuse Z72.0   8. Allergic contact dermatitis due to adhesives L23.1    right forearm    Local care to rash on right forearm  confirm if he should be taking both sertraline and citalopram. Cont other meds as ordered  [facility] and [facility]  PT/OT/ST as ordered  GOAL: short term rehab and d/c home when medically appropriate. Communicated with pt and nursing.  Will follow  Yann Biehn S. Ancil Linsey  [facility] [address] [place], [place] [address] [phone] Cell (Monday-Friday 8 AM - 5 PM) [phone] After 5 PM and follow prompts

## 2017-01-09 ENCOUNTER — Encounter: Payer: Self-pay | Admitting: Adult Health

## 2017-01-09 ENCOUNTER — Non-Acute Institutional Stay (SKILLED_NURSING_FACILITY): Payer: Self-pay | Admitting: Adult Health

## 2017-01-09 DIAGNOSIS — L959 Vasculitis limited to the skin, unspecified: Secondary | ICD-10-CM

## 2017-01-09 NOTE — Progress Notes (Signed)
Location:   [facility] Room Number: 119 Place of Service:  SNF (31)   CODE STATUS: Full Code  Allergies  Allergen Reactions  . Penicillins Other (See Comments)    Has patient had a PCN reaction causing immediate rash, facial/tongue/throat swelling, SOB or lightheadedness with hypotension: Yes Has patient had a PCN reaction causing severe rash involving mucus membranes or skin necrosis: No Has patient had a PCN reaction that required hospitalization: No Has patient had a PCN reaction occurring within the last 10 years: No If all of the above answers are "NO", then may proceed with Cephalosporin use.     Chief Complaint  Patient presents with  . Acute Visit    rash on left elbow    HPI:  He has a history of vasculitis. He states that his doctor has just monitored him in the past; and the rash resolved on its own. He does have the rash on left elbow; right foot; right wrist. He denies any itching; pain of fever associated with the rash. The nurses tell me that they have noticed the rash today.    Past Medical History:  Diagnosis Date  . Diabetes mellitus without complication (HCC)   . Hypertension   . Stroke [facility])     Past Surgical History:  Procedure Laterality Date  . APPENDECTOMY    . BACK SURGERY      Social History   Social History  . Marital status: Legally Separated    Spouse name: N/A  . Number of children: N/A  . Years of education: N/A   Occupational History  . Not on file.   Social History Main Topics  . Smoking status: Current Every Day Smoker  . Smokeless tobacco: Never Used  . Alcohol use No  . Drug use: No  . Sexual activity: Not on file   Other Topics Concern  . Not on file   Social History Narrative  . No narrative on file   History reviewed. No pertinent family history.    VITAL SIGNS There were no vitals taken for this visit.  None available   Patient's Medications  New Prescriptions   No medications on file    Previous Medications   AMLODIPINE (NORVASC) 10 MG TABLET    Take 10 mg by mouth at bedtime.    ATENOLOL (TENORMIN) 50 MG TABLET    Take 50 mg by mouth at bedtime.    ATORVASTATIN (LIPITOR) 80 MG TABLET    Take 80 mg by mouth daily.   CHLORTHALIDONE (HYGROTON) 50 MG TABLET    Take 50 mg by mouth daily.   CITALOPRAM (CELEXA) 40 MG TABLET    Take 40 mg by mouth daily.   CLOPIDOGREL (PLAVIX) 75 MG TABLET    Take 1 tablet (75 mg total) by mouth daily.   FOLIC ACID (FOLVITE) 1 MG TABLET    Take 1 mg by mouth daily.   INSULIN ASPART (NOVOLOG) 100 UNIT/ML INJECTION    Inject 10 Units into the skin 3 (three) times daily before meals.   INSULIN GLARGINE (LANTUS) 100 UNIT/ML INJECTION    Inject 20 Units into the skin at bedtime.   LISINOPRIL (PRINIVIL,ZESTRIL) 40 MG TABLET    Take 40 mg by mouth daily.   NUTRITIONAL SUPPLEMENTS (NUTRITIONAL SUPPLEMENT PO)    HSG Regular Diet - HSG Mech soft texture, Nectar consistency   SERTRALINE (ZOLOFT) 100 MG TABLET    Take 100 mg by mouth daily.  Modified Medications   No medications  on file  Discontinued Medications   No medications on file     SIGNIFICANT DIAGNOSTIC EXAMS   TODAY: PREVIOUS   12-25-16: ct of head:  1. No acute cortically based infarct or acute intracranial hemorrhage identified. 2. ASPECTS is 10. 3. Markedly advanced cerebral volume loss and new evidence of bilateral small vessel ischemia since a prior head CT in 2012.Marland Kitchen  12-25-16: ct angio of head and neck:  1. Negative for emergent large vessel occlusion but positive for severe stenosis of the right PCA P1 and P2 segments.. 2. No arterial occlusion identified. Mild or up to moderate irregularity of other circle of Willis branches, including the left PCA and left MCA. 3. No Basilar Artery or extracranial atherosclerosis or stenosis. 4. No acute findings in the neck.  Prior ACDF.  12-26-16: ct of head:  1. No acute intracranial hemorrhage status post tPA. 2. Evolving 9 mm acute ischemic  infarct at the posterior limb of the left internal capsule, stable from recent MRI. 3. No other new acute intracranial process.  12-26-16: 2-d echo:   - Left ventricle: The cavity size was normal. There was mild concentric hypertrophy. Systolic function was normal. The estimated ejection fraction was in the range of 50% to 55%. Wall motion was normal; there were no regional wall motion abnormalities. Doppler parameters are consistent with abnormal left ventricular relaxation (grade 1 diastolic dysfunction)  Doppler parameters are consistent with indeterminate ventricular filling pressure. - Aortic valve: Transvalvular velocity was within the normal range. There was no stenosis. There was no regurgitation. - Mitral valve: Transvalvular velocity was within the normal range.There was no evidence for stenosis. There was no regurgitation. - Right ventricle: The cavity size was normal. Wall thickness was normal. Systolic function was normal. - Atrial septum: No defect or patent foramen ovale was identified. - Tricuspid valve: There was trivial regurgitation. - Pulmonary arteries: Systolic pressure was within the normal range.  12-26-16: mri brain:  1. 9 mm acute infarct in the posterior limb left internal capsule. 2. Chronic small vessel ischemia with multiple remote lacunar infarcts, extensive for age.  12-28-16: swallow study:  nectar thick liquids  NO NEW EXAMS   LABS REVIEWED PREVIOUS:   12-25-16: wbc 9.4; hgb 15.6; hct 42.7; mcv 81.8; plt 249; glucose 398; bun 9; creat 1.02; k+ 3.2; na++ 135; ca 9.9 liver normal albumin 3.8 12-26-16: glucose 363; bun 10; creat 1.02; k+ 3.5; na++ 136; ca 9.9  liver normal albumin 3.6; hgb a1c 12.3; chol 222; trig 439; hdl 39 12-31-16: wbc 9.7; hgb 14.7; hct 42.0; mcv 84.0; plt 251; glucose 172; bun 10; creat 0;96 k+ 3.2 ;na++ 137; ca 9.4  01-02-17: wbc 9.7; hgb 14.5; hct 41.7 ;mcv 84.8; plt 257; glucose 145; bun 13; creat 1.15; k+ 3.5; na++137; ca 9.9; mag 1.8  NO NEW  LABS   Review of Systems  Reason unable to perform ROS: difficult due to dysarthia     Constitutional: Negative for fever.  Respiratory: Negative for cough and shortness of breath.   Cardiovascular: Negative for chest pain, palpitations and leg swelling.  Gastrointestinal: Negative for abdominal pain.  Musculoskeletal: Negative for joint pain and myalgias.  Skin: Positive for rash.  Psychiatric/Behavioral: The patient is not nervous/anxious.     Physical Exam  Constitutional: He is oriented to person, place, and time. He appears well-developed and well-nourished. No distress.  Eyes: Conjunctivae are normal.  Neck: Neck supple. No thyromegaly present.  Cardiovascular: Normal rate, regular rhythm, normal heart sounds and intact  distal pulses.   Pulmonary/Chest: Effort normal and breath sounds normal. No respiratory distress.  Abdominal: Soft. Bowel sounds are normal. He exhibits no distension. There is no tenderness.  Musculoskeletal: He exhibits no edema.  Has right facial droop Right hemiplegia   Lymphadenopathy:    He has no cervical adenopathy.  Neurological: He is alert and oriented to person, place, and time.  Skin: Skin is warm and dry. Rash noted. He is not diaphoretic.  Has vasculitis rash on left elbow right foot and right wrist Is red and raised   Psychiatric: He has a normal mood and affect.    ASSESSMENT/ PLAN:  TODAY:   1. Vasculitis: will monitor his status at this time; if his rash gets worse; will change his treatment at that time.    MD is aware of resident's narcotic use and is in agreement with current plan of care. We will attempt to wean resident as apropriate   Synthia Innocent NP [facility]  Contact [phone] Monday through Friday 8am- 5pm  After hours call [phone]

## 2017-01-11 ENCOUNTER — Encounter: Payer: Self-pay | Admitting: Adult Health

## 2017-01-11 ENCOUNTER — Non-Acute Institutional Stay (SKILLED_NURSING_FACILITY): Payer: Self-pay | Admitting: Adult Health

## 2017-01-11 DIAGNOSIS — L959 Vasculitis limited to the skin, unspecified: Secondary | ICD-10-CM

## 2017-01-11 NOTE — Progress Notes (Signed)
Location:   [facility] Room Number: 119 Place of Service:  SNF (31)   CODE STATUS: Full Code  Allergies  Allergen Reactions  . Penicillins Other (See Comments)    Has patient had a PCN reaction causing immediate rash, facial/tongue/throat swelling, SOB or lightheadedness with hypotension: Yes Has patient had a PCN reaction causing severe rash involving mucus membranes or skin necrosis: No Has patient had a PCN reaction that required hospitalization: No Has patient had a PCN reaction occurring within the last 10 years: No If all of the above answers are "NO", then may proceed with Cephalosporin use.     Chief Complaint  Patient presents with  . Acute Visit    Re-assess vasculitis    HPI:  He has a vasculitis rash which is resolving the right wrist area has resolved the elbow and foot are improving the rash is no longer raised. He tells me that does happen on occasions. There are no reports of fever; pain; or further areas of vasculitis present. There are no nursing concerns at this time.    Past Medical History:  Diagnosis Date  . Diabetes mellitus without complication (HCC)   . Hypertension   . Stroke [facility])     Past Surgical History:  Procedure Laterality Date  . APPENDECTOMY    . BACK SURGERY      Social History   Social History  . Marital status: Legally Separated    Spouse name: N/A  . Number of children: N/A  . Years of education: N/A   Occupational History  . Not on file.   Social History Main Topics  . Smoking status: Current Every Day Smoker  . Smokeless tobacco: Never Used  . Alcohol use No  . Drug use: No  . Sexual activity: Not on file   Other Topics Concern  . Not on file   Social History Narrative  . No narrative on file   History reviewed. No pertinent family history.    VITAL SIGNS BP 100/67   Pulse 65   Temp 98.1 F (36.7 C)   Resp 18   Ht  (1.753 m)   Wt 178 lb 12.8 oz (81.1 kg)   SpO2 95%   BMI 26.40  kg/m   Patient's Medications  New Prescriptions   No medications on file  Previous Medications   AMLODIPINE (NORVASC) 10 MG TABLET    Take 10 mg by mouth at bedtime.    ATENOLOL (TENORMIN) 50 MG TABLET    Take 50 mg by mouth at bedtime.    ATORVASTATIN (LIPITOR) 80 MG TABLET    Take 80 mg by mouth daily.   CHLORTHALIDONE (HYGROTON) 50 MG TABLET    Take 50 mg by mouth daily.   CITALOPRAM (CELEXA) 40 MG TABLET    Take 40 mg by mouth daily.   CLOPIDOGREL (PLAVIX) 75 MG TABLET    Take 1 tablet (75 mg total) by mouth daily.   FOLIC ACID (FOLVITE) 1 MG TABLET    Take 1 mg by mouth daily.   INSULIN ASPART (NOVOLOG) 100 UNIT/ML INJECTION    Inject 10 Units into the skin 3 (three) times daily before meals.   INSULIN GLARGINE (LANTUS) 100 UNIT/ML INJECTION    Inject 20 Units into the skin at bedtime.   LISINOPRIL (PRINIVIL,ZESTRIL) 40 MG TABLET    Take 40 mg by mouth daily.   NUTRITIONAL SUPPLEMENTS (NUTRITIONAL SUPPLEMENT PO)    HSG Regular Diet - HSG Mech soft texture, Nectar  consistency   SERTRALINE (ZOLOFT) 100 MG TABLET    Take 100 mg by mouth daily.  Modified Medications   No medications on file  Discontinued Medications   No medications on file     SIGNIFICANT DIAGNOSTIC EXAMS  TODAY: PREVIOUS   12-25-16: ct of head:  1. No acute cortically based infarct or acute intracranial hemorrhage identified. 2. ASPECTS is 10. 3. Markedly advanced cerebral volume loss and new evidence of bilateral small vessel ischemia since a prior head CT in 2012.Marland Kitchen  12-25-16: ct angio of head and neck:  1. Negative for emergent large vessel occlusion but positive for severe stenosis of the right PCA P1 and P2 segments.. 2. No arterial occlusion identified. Mild or up to moderate irregularity of other circle of Willis branches, including the left PCA and left MCA. 3. No Basilar Artery or extracranial atherosclerosis or stenosis. 4. No acute findings in the neck.  Prior ACDF.  12-26-16: ct of head:  1. No acute  intracranial hemorrhage status post tPA. 2. Evolving 9 mm acute ischemic infarct at the posterior limb of the left internal capsule, stable from recent MRI. 3. No other new acute intracranial process.  12-26-16: 2-d echo:   - Left ventricle: The cavity size was normal. There was mild concentric hypertrophy. Systolic function was normal. The estimated ejection fraction was in the range of 50% to 55%. Wall motion was normal; there were no regional wall motion abnormalities. Doppler parameters are consistent with abnormal left ventricular relaxation (grade 1 diastolic dysfunction)  Doppler parameters are consistent with indeterminate ventricular filling pressure. - Aortic valve: Transvalvular velocity was within the normal range. There was no stenosis. There was no regurgitation. - Mitral valve: Transvalvular velocity was within the normal range.There was no evidence for stenosis. There was no regurgitation. - Right ventricle: The cavity size was normal. Wall thickness was normal. Systolic function was normal. - Atrial septum: No defect or patent foramen ovale was identified. - Tricuspid valve: There was trivial regurgitation. - Pulmonary arteries: Systolic pressure was within the normal range.  12-26-16: mri brain:  1. 9 mm acute infarct in the posterior limb left internal capsule. 2. Chronic small vessel ischemia with multiple remote lacunar infarcts, extensive for age.  12-28-16: swallow study:  nectar thick liquids  NO NEW EXAMS   LABS REVIEWED PREVIOUS:   12-25-16: wbc 9.4; hgb 15.6; hct 42.7; mcv 81.8; plt 249; glucose 398; bun 9; creat 1.02; k+ 3.2; na++ 135; ca 9.9 liver normal albumin 3.8 12-26-16: glucose 363; bun 10; creat 1.02; k+ 3.5; na++ 136; ca 9.9  liver normal albumin 3.6; hgb a1c 12.3; chol 222; trig 439; hdl 39 12-31-16: wbc 9.7; hgb 14.7; hct 42.0; mcv 84.0; plt 251; glucose 172; bun 10; creat 0;96 k+ 3.2 ;na++ 137; ca 9.4  01-02-17: wbc 9.7; hgb 14.5; hct 41.7 ;mcv 84.8; plt 257;  glucose 145; bun 13; creat 1.15; k+ 3.5; na++137; ca 9.9; mag 1.8  NO NEW LABS   Review of Systems  Reason unable to perform ROS: difficult due to dysarthia   Constitutional: Negative for fever.  Respiratory: Negative for cough and shortness of breath.   Cardiovascular: Negative for chest pain and leg swelling.  Gastrointestinal: Negative for abdominal pain, constipation and heartburn.  Musculoskeletal: Negative for back pain and myalgias.  Skin: Positive for rash.       Improving   Psychiatric/Behavioral: Negative for depression.    Physical Exam  Constitutional: He is oriented to person, place, and time. No distress.  Eyes: Conjunctivae are normal.  Neck: Neck supple. No thyromegaly present.  Cardiovascular: Normal rate, regular rhythm, normal heart sounds and intact distal pulses.   Pulmonary/Chest: Effort normal and breath sounds normal. No respiratory distress.  Abdominal: Soft. Bowel sounds are normal. He exhibits no distension. There is no tenderness.  Musculoskeletal:  Has right facial droop Right hemiplegia     Lymphadenopathy:    He has no cervical adenopathy.  Neurological: He is alert and oriented to person, place, and time.  Skin: Skin is warm and dry. He is not diaphoretic.  Rash is resolving   Psychiatric: He has a normal mood and affect.    ASSESSMENT/ PLAN:  TODAY:   1. Vasculitis: is resolving: will continue to monitor his status             MD is aware of resident's narcotic use and is in agreement with current plan of care. We will attempt to wean resident as apropriate   Synthia Innocent NP [facility]  Contact [phone] Monday through Friday 8am- 5pm  After hours call [phone]

## 2017-01-16 DIAGNOSIS — E785 Hyperlipidemia, unspecified: Secondary | ICD-10-CM

## 2017-01-16 DIAGNOSIS — E1169 Type 2 diabetes mellitus with other specified complication: Secondary | ICD-10-CM | POA: Insufficient documentation

## 2017-01-16 DIAGNOSIS — F418 Other specified anxiety disorders: Secondary | ICD-10-CM | POA: Insufficient documentation

## 2017-01-16 DIAGNOSIS — E1165 Type 2 diabetes mellitus with hyperglycemia: Secondary | ICD-10-CM

## 2017-01-16 DIAGNOSIS — I10 Essential (primary) hypertension: Secondary | ICD-10-CM | POA: Insufficient documentation

## 2017-01-16 DIAGNOSIS — E1149 Type 2 diabetes mellitus with other diabetic neurological complication: Secondary | ICD-10-CM | POA: Insufficient documentation

## 2017-01-16 DIAGNOSIS — IMO0002 Reserved for concepts with insufficient information to code with codable children: Secondary | ICD-10-CM | POA: Insufficient documentation

## 2017-01-17 ENCOUNTER — Other Ambulatory Visit (HOSPITAL_COMMUNITY): Payer: Self-pay | Admitting: Internal Medicine

## 2017-01-17 DIAGNOSIS — R1310 Dysphagia, unspecified: Secondary | ICD-10-CM

## 2017-01-19 DIAGNOSIS — L959 Vasculitis limited to the skin, unspecified: Secondary | ICD-10-CM | POA: Insufficient documentation

## 2017-01-23 ENCOUNTER — Ambulatory Visit (HOSPITAL_COMMUNITY)
Admission: RE | Admit: 2017-01-23 | Discharge: 2017-01-23 | Disposition: A | Discharge status: Home / Self Care | Payer: Non-veteran care | Source: Ambulatory Visit | Attending: Internal Medicine | Admitting: Internal Medicine

## 2017-01-23 DIAGNOSIS — Z8673 Personal history of transient ischemic attack (TIA), and cerebral infarction without residual deficits: Secondary | ICD-10-CM | POA: Insufficient documentation

## 2017-01-23 DIAGNOSIS — I10 Essential (primary) hypertension: Secondary | ICD-10-CM | POA: Insufficient documentation

## 2017-01-23 DIAGNOSIS — E119 Type 2 diabetes mellitus without complications: Secondary | ICD-10-CM | POA: Insufficient documentation

## 2017-01-23 DIAGNOSIS — R1310 Dysphagia, unspecified: Secondary | ICD-10-CM | POA: Insufficient documentation

## 2017-02-05 ENCOUNTER — Encounter: Payer: Self-pay | Admitting: Adult Health

## 2017-02-05 ENCOUNTER — Non-Acute Institutional Stay (SKILLED_NURSING_FACILITY): Payer: Self-pay | Admitting: Adult Health

## 2017-02-05 DIAGNOSIS — IMO0002 Reserved for concepts with insufficient information to code with codable children: Secondary | ICD-10-CM

## 2017-02-05 DIAGNOSIS — I69322 Dysarthria following cerebral infarction: Secondary | ICD-10-CM

## 2017-02-05 DIAGNOSIS — E785 Hyperlipidemia, unspecified: Secondary | ICD-10-CM

## 2017-02-05 DIAGNOSIS — E1169 Type 2 diabetes mellitus with other specified complication: Secondary | ICD-10-CM

## 2017-02-05 DIAGNOSIS — E1165 Type 2 diabetes mellitus with hyperglycemia: Secondary | ICD-10-CM

## 2017-02-05 DIAGNOSIS — I6930 Unspecified sequelae of cerebral infarction: Secondary | ICD-10-CM

## 2017-02-05 DIAGNOSIS — I10 Essential (primary) hypertension: Secondary | ICD-10-CM

## 2017-02-05 DIAGNOSIS — E1149 Type 2 diabetes mellitus with other diabetic neurological complication: Secondary | ICD-10-CM

## 2017-02-05 DIAGNOSIS — F418 Other specified anxiety disorders: Secondary | ICD-10-CM

## 2017-02-05 NOTE — Progress Notes (Signed)
Location:   [facility] Room Number: 119 B Place of Service:  SNF (31)   CODE STATUS:  Full Code  Allergies  Allergen Reactions  . Penicillins Other (See Comments) and Anaphylaxis    Has patient had a PCN reaction causing immediate rash, facial/tongue/throat swelling, SOB or lightheadedness with hypotension: Yes Has patient had a PCN reaction causing severe rash involving mucus membranes or skin necrosis: No Has patient had a PCN reaction that required hospitalization: No Has patient had a PCN reaction occurring within the last 10 years: No If all of the above answers are "NO", then may proceed with Cephalosporin use.     Chief Complaint  Patient presents with  . 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 He is doing well. He is not voicing any complaints of pain; no anxiety; depression or headaches. There are no nursing concerns at this time.   Past Medical History:  Diagnosis Date  . Diabetes mellitus without complication (HCC)   . Hypertension   . Stroke [facility])     Past Surgical History:  Procedure Laterality Date  . APPENDECTOMY    . BACK SURGERY      Social History   Social History  . Marital status: Legally Separated    Spouse name: N/A  . Number of children: N/A  . Years of education: N/A   Occupational History  . Not on file.   Social History Main Topics  . Smoking status: Current Every Day Smoker  . Smokeless tobacco: Never Used  . Alcohol use No  . Drug use: No  . Sexual activity: Not on file   Other  Topics Concern  . Not on file   Social History Narrative  . No narrative on file   History reviewed. No pertinent family history.    VITAL SIGNS Ht  (1.753 m)   Wt 176 lb 4.8 oz (80 kg)   BMI 26.03 kg/m    No other vitals available  Patient's Medications  New Prescriptions   No medications on file  Previous Medications   AMLODIPINE (NORVASC) 10 MG TABLET    Take 10 mg by mouth at bedtime.    ATENOLOL (TENORMIN) 50 MG TABLET    Take 50 mg by mouth at bedtime.    ATORVASTATIN (LIPITOR) 80 MG TABLET    Take 80 mg by mouth daily.   CHLORTHALIDONE (HYGROTON) 50 MG TABLET    Take 50 mg by mouth daily.   CLOPIDOGREL (PLAVIX) 75 MG TABLET    Take 1 tablet (75 mg total) by mouth daily.   FOLIC ACID (FOLVITE) 1 MG TABLET    Take 1 mg by mouth daily.   INSULIN ASPART (NOVOLOG) 100 UNIT/ML INJECTION    Inject 10 Units into the skin 3 (three) times daily before meals.   INSULIN GLARGINE (LANTUS) 100 UNIT/ML INJECTION    Inject 20 Units into the skin at bedtime.   LISINOPRIL (PRINIVIL,ZESTRIL) 40 MG TABLET    Take 40 mg by mouth daily.   SERTRALINE (ZOLOFT) 100 MG TABLET    Take 100 mg by mouth daily.  Modified Medications   No medications on file  Discontinued Medications   CITALOPRAM (CELEXA) 40 MG TABLET    Take 40 mg by mouth daily.   NUTRITIONAL SUPPLEMENTS (NUTRITIONAL SUPPLEMENT PO)    HSG Regular Diet - HSG Mech soft texture, Nectar consistency     SIGNIFICANT DIAGNOSTIC EXAMS  TODAY: PREVIOUS   12-25-16: ct of head:  1. No acute cortically based infarct or acute intracranial hemorrhage identified. 2. ASPECTS is 10. 3. Markedly advanced cerebral volume loss and new evidence of bilateral small vessel ischemia since a prior head CT in 2012.Marland Kitchen  12-25-16: ct angio of head and neck:  1. Negative for emergent large vessel occlusion but positive for severe stenosis of the right PCA P1 and P2 segments.. 2. No arterial occlusion identified. Mild or up to moderate irregularity of  other circle of Willis branches, including the left PCA and left MCA. 3. No Basilar Artery or extracranial atherosclerosis or stenosis. 4. No acute findings in the neck.  Prior ACDF.  12-26-16: ct of head:  1. No acute intracranial hemorrhage status post tPA. 2. Evolving 9 mm acute ischemic infarct at the posterior limb of the left internal capsule, stable from recent MRI. 3. No other new acute intracranial process.  12-26-16: 2-d echo:   - Left ventricle: The cavity size was normal. There was mild concentric hypertrophy. Systolic function was normal. The estimated ejection fraction was in the range of 50% to 55%. Wall motion was normal; there were no regional wall motion abnormalities. Doppler parameters are consistent with abnormal left ventricular relaxation (grade 1 diastolic dysfunction)  Doppler parameters are consistent with indeterminate ventricular filling pressure. - Aortic valve: Transvalvular velocity was within the normal range. There was no stenosis. There was no regurgitation. - Mitral valve: Transvalvular velocity was within the normal range.There was no evidence for stenosis. There was no regurgitation. - Right ventricle: The cavity size was normal. Wall thickness was normal. Systolic function was normal. - Atrial septum: No defect or patent foramen ovale was identified. - Tricuspid valve: There was trivial regurgitation. - Pulmonary arteries: Systolic pressure was within the normal range.  12-26-16: mri brain:  1. 9 mm acute infarct in the posterior limb left internal capsule. 2. Chronic small vessel ischemia with multiple remote lacunar infarcts, extensive for ageischemic infar 12-28-16: swallow study:  nectar thick liquids  NO NEW EXAMS   LABS REVIEWED PREVIOUS:   12-25-16: wbc 9.4; hgb 15.6; hct 42.7; mcv 81.8; plt 249; glucose 398; bun 9; creat 1.02; k+ 3.2; na++ 135; ca 9.9 liver normal albumin 3.8 12-26-16: glucose 363; bun 10; creat 1.02; k+ 3.5; na++ 136; ca 9.9  liver  normal albumin 3.6; hgb a1c 12.3; chol 222; trig 439; hdl 39 12-31-16: wbc 9.7; hgb 14.7; hct 42.0; mcv 84.0; plt 251; glucose 172; bun 10; creat 0;96 k+ 3.2 ;na++ 137; ca 9.4  01-02-17: wbc 9.7; hgb 14.5; hct 41.7 ;mcv 84.8; plt 257; glucose 145; bun 13; creat 1.15; k+ 3.5; na++137; ca 9.9; mag 1.8  NO NEW LABS   Review of Systems  Constitutional: Negative for malaise/fatigue.       Has dysarthria   Respiratory: Negative for cough and shortness of breath.   Cardiovascular: Negative for chest pain, palpitations and leg swelling.  Gastrointestinal: Negative for abdominal pain, constipation and heartburn.  Musculoskeletal: Negative for back pain, joint pain and myalgias.  Skin: Negative.   Neurological: Negative for dizziness.  Psychiatric/Behavioral: The patient is not nervous/anxious.    Physical Exam  Constitutional: He is oriented to person, place, and time. He appears well-developed and well-nourished. No distress.  Eyes: Conjunctivae are normal.  Neck: Neck supple. No thyromegaly present.  Abdominal: Soft. Bowel sounds are normal. He exhibits no distension. There is no tenderness.  Musculoskeletal: He exhibits no edema.  Has right hemiplegia Has slight right facial droop   Lymphadenopathy:    He has no cervical adenopathy.  Neurological: He is alert and oriented to person, place, and time.  Skin: Skin is warm and dry. He is not diaphoretic.  Psychiatric: He has a normal mood and affect.    ASSESSMENT/ PLAN:  TODAY:   1. Hypertension: stable  will continue norvasc 10 mg daily; tenormin 50 mg nightly ; lisinopril 40 mg daily and hygroton 50 mg daily   2.  Diabetes: unchanged hgb a1c 12.3 will stop humalog and will keep novolog and will increase to 10 units after meals and lantus 20 units nightly   3. Dyslipidemia: no change in status: trig 439; hdl 39: will continue lipitor 80 mg daily   4. Chronic CVA at left PLIC: is neuologicaly stable; has right side hemiparesis:  will  continue therapy as directed to improve upon his independence wit his adls: will continue plavix 75 mg daily   5. Depression with anxiety: is stable; will continue zoloft 100 mg daily and will stop celexa; will monitor      MD is aware of resident's narcotic use and is in agreement with current plan of care. We will attempt to wean resident as apropriate     Synthia Innocent NP [facility]  Contact [phone] Monday through Friday 8am- 5pm  After hours call [phone]

## 2017-02-14 ENCOUNTER — Non-Acute Institutional Stay (SKILLED_NURSING_FACILITY): Payer: Self-pay | Admitting: Adult Health

## 2017-02-14 ENCOUNTER — Encounter: Payer: Self-pay | Admitting: Adult Health

## 2017-02-14 DIAGNOSIS — I6930 Unspecified sequelae of cerebral infarction: Secondary | ICD-10-CM

## 2017-02-14 DIAGNOSIS — I69359 Hemiplegia and hemiparesis following cerebral infarction affecting unspecified side: Secondary | ICD-10-CM

## 2017-02-14 DIAGNOSIS — I69322 Dysarthria following cerebral infarction: Secondary | ICD-10-CM

## 2017-02-14 NOTE — Progress Notes (Signed)
Location:   [facility] Room Number: 119 B Place of Service:  SNF (31)   CODE STATUS: Full Code  Allergies  Allergen Reactions  . Penicillins Other (See Comments) and Anaphylaxis    Has patient had a PCN reaction causing immediate rash, facial/tongue/throat swelling, SOB or lightheadedness with hypotension: Yes Has patient had a PCN reaction causing severe rash involving mucus membranes or skin necrosis: No Has patient had a PCN reaction that required hospitalization: No Has patient had a PCN reaction occurring within the last 10 years: No If all of the above answers are "NO", then may proceed with Cephalosporin use.     Chief Complaint  Patient presents with  . Acute Visit    Care Plan Meeting    HPI:  We have come together to for his routine care plan meeting. We have discussed his progress with therapy; he is ambulating 20 feet with a walker. His goal remains to go to home when he is able. He will need a wheelchair, he has 4 steps to get into his sister's house where he will then transition to his own apartment. We did discuss his medications and he did verbalize understanding. He did not have nursing concerns and did not have any medical concerns. There are no concerns from the nursing staff.    Past Medical History:  Diagnosis Date  . Diabetes mellitus without complication (HCC)   . Hypertension   . Stroke [facility](HCC)     Past Surgical History:  Procedure Laterality Date  . APPENDECTOMY    . BACK SURGERY      Social History   Social History  . Marital status: Legally Separated    Spouse name: N/A  . Number of children: N/A  . Years of education: N/A   Occupational History  . Not on file.   Social History Main Topics  . Smoking status: Current Every Day Smoker  . Smokeless tobacco: Never Used  . Alcohol use No  . Drug use: No  . Sexual activity: Not on file   Other Topics Concern  . Not on file   Social History Narrative  . No narrative on  file   History reviewed. No pertinent family history.    VITAL SIGNS Ht 5\' 9"  (1.753 m)   Wt 176 lb 4.8 oz (80 kg)   BMI 26.03 kg/m    Patient's Medications  New Prescriptions   No medications on file  Previous Medications   AMLODIPINE (NORVASC) 10 MG TABLET    Take 10 mg by mouth at bedtime.    ATENOLOL (TENORMIN) 50 MG TABLET    Take 50 mg by mouth at bedtime.    ATORVASTATIN (LIPITOR) 80 MG TABLET    Take 80 mg by mouth daily.   CHLORTHALIDONE (HYGROTON) 50 MG TABLET    Take 50 mg by mouth daily.   CLOPIDOGREL (PLAVIX) 75 MG TABLET    Take 1 tablet (75 mg total) by mouth daily.   FOLIC ACID (FOLVITE) 1 MG TABLET    Take 1 mg by mouth daily.   INSULIN ASPART (NOVOLOG) 100 UNIT/ML INJECTION    Inject 10 Units into the skin 3 (three) times daily before meals.   INSULIN GLARGINE (LANTUS) 100 UNIT/ML INJECTION    Inject 20 Units into the skin at bedtime.   LISINOPRIL (PRINIVIL,ZESTRIL) 40 MG TABLET    Take 40 mg by mouth daily.   SERTRALINE (ZOLOFT) 100 MG TABLET    Take 100 mg by mouth daily.  Modified Medications   No medications on file  Discontinued Medications   NUTRITIONAL SUPPLEMENTS (NUTRITIONAL SUPPLEMENT PO)    HSG Regular Diet - HSG Mech soft texture, Nectar consistency     SIGNIFICANT DIAGNOSTIC EXAMS    PREVIOUS   12-25-16: ct of head:  1. No acute cortically based infarct or acute intracranial hemorrhage identified. 2. ASPECTS is 10. 3. Markedly advanced cerebral volume loss and new evidence of bilateral small vessel ischemia since a prior head CT in 2012.Marland Kitchen  12-25-16: ct angio of head and neck:  1. Negative for emergent large vessel occlusion but positive for severe stenosis of the right PCA P1 and P2 segments.. 2. No arterial occlusion identified. Mild or up to moderate irregularity of other circle of Willis branches, including the left PCA and left MCA. 3. No Basilar Artery or extracranial atherosclerosis or stenosis. 4. No acute findings in the neck.  Prior  ACDF.  12-26-16: ct of head:  1. No acute intracranial hemorrhage status post tPA. 2. Evolving 9 mm acute ischemic infarct at the posterior limb of the left internal capsule, stable from recent MRI. 3. No other new acute intracranial process.  12-26-16: 2-d echo:   - Left ventricle: The cavity size was normal. There was mild concentric hypertrophy. Systolic function was normal. The estimated ejection fraction was in the range of 50% to 55%. Wall motion was normal; there were no regional wall motion abnormalities. Doppler parameters are consistent with abnormal left ventricular relaxation (grade 1 diastolic dysfunction)  Doppler parameters are consistent with indeterminate ventricular filling pressure. - Aortic valve: Transvalvular velocity was within the normal range. There was no stenosis. There was no regurgitation. - Mitral valve: Transvalvular velocity was within the normal range.There was no evidence for stenosis. There was no regurgitation. - Right ventricle: The cavity size was normal. Wall thickness was normal. Systolic function was normal. - Atrial septum: No defect or patent foramen ovale was identified. - Tricuspid valve: There was trivial regurgitation. - Pulmonary arteries: Systolic pressure was within the normal range.  12-26-16: mri brain:  1. 9 mm acute infarct in the posterior limb left internal capsule. 2. Chronic small vessel ischemia with multiple remote lacunar infarcts, extensive for ageischemic infar 12-28-16: swallow study:  nectar thick liquids  NO NEW EXAMS   LABS REVIEWED PREVIOUS:   12-25-16: wbc 9.4; hgb 15.6; hct 42.7; mcv 81.8; plt 249; glucose 398; bun 9; creat 1.02; k+ 3.2; na++ 135; ca 9.9 liver normal albumin 3.8 12-26-16: glucose 363; bun 10; creat 1.02; k+ 3.5; na++ 136; ca 9.9  liver normal albumin 3.6; hgb a1c 12.3; chol 222; trig 439; hdl 39 12-31-16: wbc 9.7; hgb 14.7; hct 42.0; mcv 84.0; plt 251; glucose 172; bun 10; creat 0;96 k+ 3.2 ;na++ 137; ca 9.4    01-02-17: wbc 9.7; hgb 14.5; hct 41.7 ;mcv 84.8; plt 257; glucose 145; bun 13; creat 1.15; k+ 3.5; na++137; ca 9.9; mag 1.8  NO NEW LABS   Review of Systems  Constitutional: Negative for malaise/fatigue.       Has mild dysarthria   Respiratory: Negative for cough and shortness of breath.   Cardiovascular: Negative for chest pain, palpitations and leg swelling.  Gastrointestinal: Negative for abdominal pain, constipation and heartburn.  Musculoskeletal: Negative for back pain, joint pain and myalgias.  Skin: Negative.   Neurological: Negative for dizziness.  Psychiatric/Behavioral: The patient is not nervous/anxious.    Physical Exam  Constitutional: He is oriented to person, place, and time. He appears well-developed and  well-nourished. No distress.  Neck: Neck supple. Carotid bruit is present. No thyromegaly present.  right  Cardiovascular: Normal rate, regular rhythm and intact distal pulses.  Murmur heard. 1/6  Pulmonary/Chest: Effort normal and breath sounds normal. No respiratory distress.  Abdominal: Bowel sounds are normal. He exhibits no distension. There is no tenderness.  Musculoskeletal: Normal range of motion.  Has right hemiplegia Has slight right facial droop     Lymphadenopathy:    He has no cervical adenopathy.  Neurological: He is alert and oriented to person, place, and time.  Skin: Skin is warm and dry. He is not diaphoretic.  Psychiatric: He has a normal mood and affect.     ASSESSMENT/ PLAN:  1. Chronic cerebrovascular accident 2. Dysarthria due to cva 3. Hemiparesis of dominant side due to recent cva  At this time will continue his current plan of care. Will continue therapy as directed His goal is for short term rehab  Time spent with patient: 30 minutes: discussed goals of care; expectations of what to expect upon discharge; discharge plan and medications; did verbalize understanding.   MD is aware of resident's narcotic use and is in agreement  with current plan of care. We will attempt to wean resident as apropriate   Synthia Innocent NP [facility]  Contact [phone] Monday through Friday 8am- 5pm  After hours call [phone]

## 2017-03-07 ENCOUNTER — Ambulatory Visit: Payer: Self-pay | Admitting: Neurology

## 2017-03-08 ENCOUNTER — Encounter: Payer: Self-pay | Admitting: Neurology

## 2017-03-09 ENCOUNTER — Non-Acute Institutional Stay (SKILLED_NURSING_FACILITY): Payer: Self-pay | Admitting: Adult Health

## 2017-03-09 ENCOUNTER — Encounter: Payer: Self-pay | Admitting: Adult Health

## 2017-03-09 DIAGNOSIS — E785 Hyperlipidemia, unspecified: Secondary | ICD-10-CM

## 2017-03-09 DIAGNOSIS — I10 Essential (primary) hypertension: Secondary | ICD-10-CM

## 2017-03-09 DIAGNOSIS — E1149 Type 2 diabetes mellitus with other diabetic neurological complication: Secondary | ICD-10-CM

## 2017-03-09 DIAGNOSIS — E1165 Type 2 diabetes mellitus with hyperglycemia: Secondary | ICD-10-CM

## 2017-03-09 DIAGNOSIS — E1169 Type 2 diabetes mellitus with other specified complication: Secondary | ICD-10-CM

## 2017-03-09 DIAGNOSIS — IMO0002 Reserved for concepts with insufficient information to code with codable children: Secondary | ICD-10-CM

## 2017-03-09 NOTE — Progress Notes (Signed)
Location:   [facility] Room Number: 119 B Place of Service:  SNF (31)   CODE STATUS: Full Code  Allergies  Allergen Reactions  . Penicillins Other (See Comments) and Anaphylaxis    Has patient had a PCN reaction causing immediate rash, facial/tongue/throat swelling, SOB or lightheadedness with hypotension: Yes Has patient had a PCN reaction causing severe rash involving mucus membranes or skin necrosis: No Has patient had a PCN reaction that required hospitalization: No Has patient had a PCN reaction occurring within the last 10 years: No If all of the above answers are "NO", then may proceed with Cephalosporin use.     Chief Complaint  Patient presents with  . Medical Management of Chronic Issues    Hypertension; diabetes; dyslipidemia     HPI:  He is a 46 year old term resident of this facility being seen for the management of his chronic illnesses: type II mellitus with neurological manifestation, uncontrolled; dyslipidemia associated with type 2 diabetes mellitus; essential hypertension, benign.  He tells me that he is feeling good. He denies any changes in appetite; denies any back pain or joint pain; denies insomnia. There are no nursing concerns at this time.    Past Medical History:  Diagnosis Date  . Diabetes mellitus without complication (HCC)   . Hypertension   . Stroke [facility])     Past Surgical History:  Procedure Laterality Date  . APPENDECTOMY    . BACK SURGERY      Social History   Socioeconomic History  . Marital status: Legally Separated    Spouse name: Not on file  . Number of children: Not on file  . Years of education: Not on file  . Highest education level: Not on file  Social Needs  . Financial resource strain: Not on file  . Food insecurity - worry: Not on file  . Food insecurity - inability: Not on file  . Transportation needs - medical: Not on file  . Transportation needs - non-medical: Not on file  Occupational History  .  Not on file  Tobacco Use  . Smoking status: Current Every Day Smoker  . Smokeless tobacco: Never Used  Substance and Sexual Activity  . Alcohol use: No  . Drug use: No  . Sexual activity: Not on file  Other Topics Concern  . Not on file  Social History Narrative  . Not on file   History reviewed. No pertinent family history.    VITAL SIGNS BP 115/72   Pulse (!) 59   Temp (!) 96.8 F (36 C)   Resp 18   Ht 5\' 9"  (1.753 m)   Wt 182 lb (82.6 kg)   SpO2 95%   BMI 26.88 kg/m     Medication List        Accurate as of 03/09/17 11:59 PM. Always use your most recent med list.          amLODipine 10 MG tablet Commonly known as:  NORVASC   atenolol 50 MG tablet Commonly known as:  TENORMIN   atorvastatin 80 MG tablet Commonly known as:  LIPITOR   chlorthalidone 50 MG tablet Commonly known as:  HYGROTON   clopidogrel 75 MG tablet Commonly known as:  PLAVIX Take 1 tablet (75 mg total) by mouth daily.   folic acid 1 MG tablet Commonly known as:  FOLVITE   insulin glargine 100 UNIT/ML injection Commonly known as:  LANTUS   lisinopril 40 MG tablet Commonly known as:  PRINIVIL,ZESTRIL  NOVOLOG 100 UNIT/ML injection Generic drug:  insulin aspart   sertraline 100 MG tablet Commonly known as:  ZOLOFT        SIGNIFICANT DIAGNOSTIC EXAMS  PREVIOUS   12-25-16: ct of head:  1. No acute cortically based infarct or acute intracranial hemorrhage identified. 2. ASPECTS is 10. 3. Markedly advanced cerebral volume loss and new evidence of bilateral small vessel ischemia since a prior head CT in 2012.Marland Kitchen  12-25-16: ct angio of head and neck:  1. Negative for emergent large vessel occlusion but positive for severe stenosis of the right PCA P1 and P2 segments.. 2. No arterial occlusion identified. Mild or up to moderate irregularity of other circle of Willis branches, including the left PCA and left MCA. 3. No Basilar Artery or extracranial atherosclerosis or  stenosis. 4. No acute findings in the neck.  Prior ACDF.  12-26-16: ct of head:  1. No acute intracranial hemorrhage status post tPA. 2. Evolving 9 mm acute ischemic infarct at the posterior limb of the left internal capsule, stable from recent MRI. 3. No other new acute intracranial process.  12-26-16: 2-d echo:   - Left ventricle: The cavity size was normal. There was mild concentric hypertrophy. Systolic function was normal. The estimated ejection fraction was in the range of 50% to 55%. Wall motion was normal; there were no regional wall motion abnormalities. Doppler parameters are consistent with abnormal left ventricular relaxation (grade 1 diastolic dysfunction)  Doppler parameters are consistent with indeterminate ventricular filling pressure. - Aortic valve: Transvalvular velocity was within the normal range. There was no stenosis. There was no regurgitation. - Mitral valve: Transvalvular velocity was within the normal range.There was no evidence for stenosis. There was no regurgitation. - Right ventricle: The cavity size was normal. Wall thickness was normal. Systolic function was normal. - Atrial septum: No defect or patent foramen ovale was identified. - Tricuspid valve: There was trivial regurgitation. - Pulmonary arteries: Systolic pressure was within the normal range.  12-26-16: mri brain:  1. 9 mm acute infarct in the posterior limb left internal capsule. 2. Chronic small vessel ischemia with multiple remote lacunar infarcts, extensive for ageischemic infar 12-28-16: swallow study:  nectar thick liquids  NO NEW EXAMS   LABS REVIEWED PREVIOUS:   12-25-16: wbc 9.4; hgb 15.6; hct 42.7; mcv 81.8; plt 249; glucose 398; bun 9; creat 1.02; k+ 3.2; na++ 135; ca 9.9 liver normal albumin 3.8 12-26-16: glucose 363; bun 10; creat 1.02; k+ 3.5; na++ 136; ca 9.9  liver normal albumin 3.6; hgb a1c 12.3; chol 222; trig 439; hdl 39 12-31-16: wbc 9.7; hgb 14.7; hct 42.0; mcv 84.0; plt 251; glucose 172;  bun 10; creat 0;96 k+ 3.2 ;na++ 137; ca 9.4  01-02-17: wbc 9.7; hgb 14.5; hct 41.7 ;mcv 84.8; plt 257; glucose 145; bun 13; creat 1.15; k+ 3.5; na++137; ca 9.9; mag 1.8  NO NEW LABS   Review of Systems  Constitutional: Negative for malaise/fatigue.       Very mild dysarthria   Respiratory: Negative for cough and shortness of breath.   Cardiovascular: Negative for chest pain, palpitations and leg swelling.  Gastrointestinal: Negative for abdominal pain, constipation and heartburn.  Musculoskeletal: Negative for back pain, joint pain and myalgias.  Skin: Negative.   Neurological: Negative for dizziness.  Psychiatric/Behavioral: The patient is not nervous/anxious.    Physical Exam  Constitutional: He is oriented to person, place, and time. He appears well-developed and well-nourished. No distress.  Neck: Neck supple. Carotid bruit is present.  No thyromegaly present.  Right   Cardiovascular: Normal rate, regular rhythm and intact distal pulses.  Murmur heard. 1/6  Pulmonary/Chest: Effort normal and breath sounds normal. No respiratory distress.  Abdominal: Soft. Bowel sounds are normal. He exhibits no distension. There is no tenderness.  Musculoskeletal: He exhibits no edema.  Has right hemiplegia Has slight right facial droop   Lymphadenopathy:    He has no cervical adenopathy.  Neurological: He is alert and oriented to person, place, and time.  Skin: Skin is warm and dry. He is not diaphoretic.  Psychiatric: He has a normal mood and affect.    ASSESSMENT/ PLAN:   TODAY:   1. Hypertension: stable B/P 115/72 will continue norvasc 10 mg daily; tenormin 50 mg nightly ; lisinopril 40 mg daily and hygroton 50 mg daily   2.  Diabetes: unchanged hgb a1c 12.3 will stop humalog and will keep novolog and will increase to 10 units after meals and lantus 20 units nightly   3. Dyslipidemia: no change in status: trig 439; hdl 39: will continue lipitor 80 mg daily  PREVIOUS    4. 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 Depression with anxiety: is stable; will continue zoloft 100 mg daily and will stop celexa; will monitor   Will check hgb a1c and lipids    MD is aware of resident's narcotic use and is in agreement with current plan of care. We will attempt to wean resident as apropriate     Synthia Innocenteborah Delvina Mizzell NP [facility]  Contact [phone] Monday through Friday 8am- 5pm  After hours call [phone]

## 2017-03-10 LAB — LIPID PANEL
CHOLESTEROL: 108 (ref 0–200)
HDL: 25 — AB (ref 35–70)
LDL Cholesterol: 39
TRIGLYCERIDES: 219 — AB (ref 40–160)

## 2017-03-12 LAB — HEMOGLOBIN A1C
HEMOGLOBIN A1C: 9
Hemoglobin A1C: 9

## 2017-03-14 ENCOUNTER — Non-Acute Institutional Stay (SKILLED_NURSING_FACILITY): Payer: Self-pay | Admitting: Adult Health

## 2017-03-14 ENCOUNTER — Encounter: Payer: Self-pay | Admitting: Adult Health

## 2017-03-14 DIAGNOSIS — IMO0002 Reserved for concepts with insufficient information to code with codable children: Secondary | ICD-10-CM

## 2017-03-14 DIAGNOSIS — E1165 Type 2 diabetes mellitus with hyperglycemia: Secondary | ICD-10-CM

## 2017-03-14 DIAGNOSIS — E1149 Type 2 diabetes mellitus with other diabetic neurological complication: Secondary | ICD-10-CM

## 2017-03-14 NOTE — Progress Notes (Signed)
Location:   [facility] Room Number: 119 B Place of Service:  SNF (31)   CODE STATUS: Full code  Allergies  Allergen Reactions  . Penicillins Other (See Comments) and Anaphylaxis    Has patient had a PCN reaction causing immediate rash, facial/tongue/throat swelling, SOB or lightheadedness with hypotension: Yes Has patient had a PCN reaction causing severe rash involving mucus membranes or skin necrosis: No Has patient had a PCN reaction that required hospitalization: No Has patient had a PCN reaction occurring within the last 10 years: No If all of the above answers are "NO", then may proceed with Cephalosporin use.     Chief Complaint  Patient presents with  . Acute Visit    DM    HPI:  He has a hgb a1c of 9.0 with the previous one of 12.3. His cbgs are improving. He is tolerating his insulin without issues. There are no reports of hypo or hyperglycemia. There are no reports of changes in appetite; no behavioral issues; and no changes in sleep. There are no nursing concerns at this time.   Past Medical History:  Diagnosis Date  . Diabetes mellitus without complication (HCC)   . Hypertension   . Stroke [facility])     Past Surgical History:  Procedure Laterality Date  . APPENDECTOMY    . BACK SURGERY      Social History   Socioeconomic History  . Marital status: Legally Separated    Spouse name: Not on file  . Number of children: Not on file  . Years of education: Not on file  . Highest education level: Not on file  Social Needs  . Financial resource strain: Not on file  . Food insecurity - worry: Not on file  . Food insecurity - inability: Not on file  . Transportation needs - medical: Not on file  . Transportation needs - non-medical: Not on file  Occupational History  . Not on file  Tobacco Use  . Smoking status: Current Every Day Smoker  . Smokeless tobacco: Never Used  Substance and Sexual Activity  . Alcohol use: No  . Drug use: No  .  Sexual activity: Not on file  Other Topics Concern  . Not on file  Social History Narrative  . Not on file   History reviewed. No pertinent family history.    VITAL SIGNS BP 110/60   Pulse 61   Temp (!) 96.8 F (36 C)   Resp 18   Ht 5\' 9"  (1.753 m)   Wt 182 lb (82.6 kg)   SpO2 95%   BMI 26.88 kg/m    Outpatient Encounter Medications as of 03/14/2017  Medication Sig  . amLODipine (NORVASC) 10 MG tablet Take 10 mg by mouth at bedtime.   Marland Kitchen atenolol (TENORMIN) 50 MG tablet Take 50 mg by mouth at bedtime.   Marland Kitchen atorvastatin (LIPITOR) 80 MG tablet Take 80 mg by mouth daily.  . chlorthalidone (HYGROTON) 50 MG tablet Take 50 mg by mouth daily.  . clopidogrel (PLAVIX) 75 MG tablet Take 1 tablet (75 mg total) by mouth daily.  . folic acid (FOLVITE) 1 MG tablet Take 1 mg by mouth daily.  . insulin aspart (NOVOLOG) 100 UNIT/ML injection Inject 10 Units into the skin 3 (three) times daily before meals.  . insulin glargine (LANTUS) 100 UNIT/ML injection Inject 20 Units into the skin at bedtime.  Marland Kitchen lisinopril (PRINIVIL,ZESTRIL) 40 MG tablet Take 40 mg by mouth daily.  . sertraline (ZOLOFT) 100 MG  tablet Take 100 mg by mouth daily.   No facility-administered encounter medications on file as of 03/14/2017.      SIGNIFICANT DIAGNOSTIC EXAMS  PREVIOUS   12-25-16: ct of head:  1. No acute cortically based infarct or acute intracranial hemorrhage identified. 2. ASPECTS is 10. 3. Markedly advanced cerebral volume loss and new evidence of bilateral small vessel ischemia since a prior head CT in 2012.Marland Kitchen  12-25-16: ct angio of head and neck:  1. Negative for emergent large vessel occlusion but positive for severe stenosis of the right PCA P1 and P2 segments.. 2. No arterial occlusion identified. Mild or up to moderate irregularity of other circle of Willis branches, including the left PCA and left MCA. 3. No Basilar Artery or extracranial atherosclerosis or stenosis. 4. No acute findings in the  neck.  Prior ACDF.  12-26-16: ct of head:  1. No acute intracranial hemorrhage status post tPA. 2. Evolving 9 mm acute ischemic infarct at the posterior limb of the left internal capsule, stable from recent MRI. 3. No other new acute intracranial process.  12-26-16: 2-d echo:   - Left ventricle: The cavity size was normal. There was mild concentric hypertrophy. Systolic function was normal. The estimated ejection fraction was in the range of 50% to 55%. Wall motion was normal; there were no regional wall motion abnormalities. Doppler parameters are consistent with abnormal left ventricular relaxation (grade 1 diastolic dysfunction)  Doppler parameters are consistent with indeterminate ventricular filling pressure. - Aortic valve: Transvalvular velocity was within the normal range. There was no stenosis. There was no regurgitation. - Mitral valve: Transvalvular velocity was within the normal range.There was no evidence for stenosis. There was no regurgitation. - Right ventricle: The cavity size was normal. Wall thickness was normal. Systolic function was normal. - Atrial septum: No defect or patent foramen ovale was identified. - Tricuspid valve: There was trivial regurgitation. - Pulmonary arteries: Systolic pressure was within the normal range.  12-26-16: mri brain:  1. 9 mm acute infarct in the posterior limb left internal capsule. 2. Chronic small vessel ischemia with multiple remote lacunar infarcts, extensive for ageischemic infar 12-28-16: swallow study:  nectar thick liquids  NO NEW EXAMS   LABS REVIEWED PREVIOUS:   12-25-16: wbc 9.4; hgb 15.6; hct 42.7; mcv 81.8; plt 249; glucose 398; bun 9; creat 1.02; k+ 3.2; na++ 135; ca 9.9 liver normal albumin 3.8 12-26-16: glucose 363; bun 10; creat 1.02; k+ 3.5; na++ 136; ca 9.9  liver normal albumin 3.6; hgb a1c 12.3; chol 222; trig 439; hdl 39 12-31-16: wbc 9.7; hgb 14.7; hct 42.0; mcv 84.0; plt 251; glucose 172; bun 10; creat 0;96 k+ 3.2 ;na++ 137; ca  9.4  01-02-17: wbc 9.7; hgb 14.5; hct 41.7 ;mcv 84.8; plt 257; glucose 145; bun 13; creat 1.15; k+ 3.5; na++137; ca 9.9; mag 1.8  TODAY:   03-12-17: hgb a1c 9.0     Review of Systems  Constitutional: Negative for malaise/fatigue.  Respiratory: Negative for cough and shortness of breath.   Cardiovascular: Negative for chest pain, palpitations and leg swelling.  Gastrointestinal: Negative for abdominal pain, constipation and heartburn.  Musculoskeletal: Negative for back pain, joint pain and myalgias.  Skin: Negative.   Neurological: Negative for dizziness.  Psychiatric/Behavioral: The patient is not nervous/anxious.       Physical Exam  Constitutional: He is oriented to person, place, and time. He appears well-developed and well-nourished. No distress.  Neck: Neck supple. Carotid bruit is present. No thyromegaly present.  Right  Cardiovascular: Normal rate, regular rhythm and intact distal pulses.  Murmur heard. 1/6  Pulmonary/Chest: Effort normal and breath sounds normal. No respiratory distress.  Abdominal: Soft. Bowel sounds are normal. He exhibits no distension. There is no tenderness.  Musculoskeletal: He exhibits no edema.  Has right hemiplegia Has slight right facial droop    Lymphadenopathy:    He has no cervical adenopathy.  Neurological: He is alert and oriented to person, place, and time.  Skin: Skin is warm and dry. He is not diaphoretic.  Psychiatric: He has a normal mood and affect.    ASSESSMENT/ PLAN:  TODAY:   1. Diabetes type II with neurological manifestations uncontrolled: improving hgb a1c 9.0 (previous hgb a1c 12.3) will continue lantus 20 units nightly and will continue novolog 10 units after meals; will begin metform xr 500 mg daily and will monitor   MD is aware of resident's narcotic use and is in agreement with current plan of care. We will attempt to wean resident as apropriate   Synthia Innocenteborah Rivaldo Hineman NP [facility]  Contact [phone]  Monday through Friday 8am- 5pm  After hours call [phone]

## 2017-03-22 ENCOUNTER — Non-Acute Institutional Stay (SKILLED_NURSING_FACILITY): Payer: Self-pay | Admitting: Adult Health

## 2017-03-22 ENCOUNTER — Encounter: Payer: Self-pay | Admitting: Adult Health

## 2017-03-22 DIAGNOSIS — E1149 Type 2 diabetes mellitus with other diabetic neurological complication: Secondary | ICD-10-CM

## 2017-03-22 DIAGNOSIS — E1165 Type 2 diabetes mellitus with hyperglycemia: Secondary | ICD-10-CM

## 2017-03-22 DIAGNOSIS — I6930 Unspecified sequelae of cerebral infarction: Secondary | ICD-10-CM

## 2017-03-22 DIAGNOSIS — I69322 Dysarthria following cerebral infarction: Secondary | ICD-10-CM

## 2017-03-22 DIAGNOSIS — I69359 Hemiplegia and hemiparesis following cerebral infarction affecting unspecified side: Secondary | ICD-10-CM

## 2017-03-22 DIAGNOSIS — IMO0002 Reserved for concepts with insufficient information to code with codable children: Secondary | ICD-10-CM

## 2017-03-22 NOTE — Progress Notes (Signed)
Location:   [facility] Room Number: 119 B Place of Service:  SNF (31)    CODE STATUS: Full Code  Allergies  Allergen Reactions  . Penicillins Other (See Comments) and Anaphylaxis    Has patient had a PCN reaction causing immediate rash, facial/tongue/throat swelling, SOB or lightheadedness with hypotension: Yes Has patient had a PCN reaction causing severe rash involving mucus membranes or skin necrosis: No Has patient had a PCN reaction that required hospitalization: No Has patient had a PCN reaction occurring within the last 10 years: No If all of the above answers are "NO", then may proceed with Cephalosporin use.     Chief Complaint  Patient presents with  . Discharge Note    Discharging to home on 03/24/16    HPI:  He is being discharged to home. He does not insurance and will not qualify for any home health. He will need a standard wheel chair and a front wheel walker with a platform. He will need his prescriptions written and will need to follow uo with his medical provider. He had been hospitalized following a cva. He was admitted to this facility for short term rehab. He is going home with his sister with his goal to return to his own apartment.    Past Medical History:  Diagnosis Date  . Diabetes mellitus without complication (HCC)   . Hypertension   . Stroke [facility])     Past Surgical History:  Procedure Laterality Date  . APPENDECTOMY    . BACK SURGERY      Social History   Socioeconomic History  . Marital status: Legally Separated    Spouse name: Not on file  . Number of children: Not on file  . Years of education: Not on file  . Highest education level: Not on file  Social Needs  . Financial resource strain: Not on file  . Food insecurity - worry: Not on file  . Food insecurity - inability: Not on file  . Transportation needs - medical: Not on file  . Transportation needs - non-medical: Not on file  Occupational History  . Not on file    Tobacco Use  . Smoking status: Current Every Day Smoker  . Smokeless tobacco: Never Used  Substance and Sexual Activity  . Alcohol use: No  . Drug use: No  . Sexual activity: Not on file  Other Topics Concern  . Not on file  Social History Narrative  . Not on file   History reviewed. No pertinent family history.  VITAL SIGNS BP 100/65   Pulse (!) 58   Ht 5\' 9"  (1.753 m)   Wt 182 lb (82.6 kg)   BMI 26.88 kg/m      Medication List        Accurate as of 03/22/17  2:25 PM. Always use your most recent med list.          amLODipine 10 MG tablet Commonly known as:  NORVASC   atenolol 50 MG tablet Commonly known as:  TENORMIN   atorvastatin 80 MG tablet Commonly known as:  LIPITOR   chlorthalidone 50 MG tablet Commonly known as:  HYGROTON   clopidogrel 75 MG tablet Commonly known as:  PLAVIX Take 1 tablet (75 mg total) by mouth daily.   folic acid 1 MG tablet Commonly known as:  FOLVITE   insulin glargine 100 UNIT/ML injection Commonly known as:  LANTUS   lisinopril 40 MG tablet Commonly known as:  PRINIVIL,ZESTRIL   metFORMIN  500 MG tablet Commonly known as:  GLUCOPHAGE   NOVOLOG 100 UNIT/ML injection Generic drug:  insulin aspart   sertraline 100 MG tablet Commonly known as:  ZOLOFT        SIGNIFICANT DIAGNOSTIC EXAMS  PREVIOUS   12-25-16: ct of head:  1. No acute cortically based infarct or acute intracranial hemorrhage identified. 2. ASPECTS is 10. 3. Markedly advanced cerebral volume loss and new evidence of bilateral small vessel ischemia since a prior head CT in 2012.Marland Kitchen  12-25-16: ct angio of head and neck:  1. Negative for emergent large vessel occlusion but positive for severe stenosis of the right PCA P1 and P2 segments.. 2. No arterial occlusion identified. Mild or up to moderate irregularity of other circle of Willis branches, including the left PCA and left MCA. 3. No Basilar Artery or extracranial atherosclerosis or stenosis. 4. No  acute findings in the neck.  Prior ACDF.  12-26-16: ct of head:  1. No acute intracranial hemorrhage status post tPA. 2. Evolving 9 mm acute ischemic infarct at the posterior limb of the left internal capsule, stable from recent MRI. 3. No other new acute intracranial process.  12-26-16: 2-d echo:   - Left ventricle: The cavity size was normal. There was mild concentric hypertrophy. Systolic function was normal. The estimated ejection fraction was in the range of 50% to 55%. Wall motion was normal; there were no regional wall motion abnormalities. Doppler parameters are consistent with abnormal left ventricular relaxation (grade 1 diastolic dysfunction)  Doppler parameters are consistent with indeterminate ventricular filling pressure. - Aortic valve: Transvalvular velocity was within the normal range. There was no stenosis. There was no regurgitation. - Mitral valve: Transvalvular velocity was within the normal range.There was no evidence for stenosis. There was no regurgitation. - Right ventricle: The cavity size was normal. Wall thickness was normal. Systolic function was normal. - Atrial septum: No defect or patent foramen ovale was identified. - Tricuspid valve: There was trivial regurgitation. - Pulmonary arteries: Systolic pressure was within the normal range.  12-26-16: mri brain:  1. 9 mm acute infarct in the posterior limb left internal capsule. 2. Chronic small vessel ischemia with multiple remote lacunar infarcts, extensive for ageischemic infar 12-28-16: swallow study:  nectar thick liquids  NO NEW EXAMS   LABS REVIEWED PREVIOUS:   12-25-16: wbc 9.4; hgb 15.6; hct 42.7; mcv 81.8; plt 249; glucose 398; bun 9; creat 1.02; k+ 3.2; na++ 135; ca 9.9 liver normal albumin 3.8 12-26-16: glucose 363; bun 10; creat 1.02; k+ 3.5; na++ 136; ca 9.9  liver normal albumin 3.6; hgb a1c 12.3; chol 222; trig 439; hdl 39 12-31-16: wbc 9.7; hgb 14.7; hct 42.0; mcv 84.0; plt 251; glucose 172; bun 10; creat  0;96 k+ 3.2 ;na++ 137; ca 9.4  01-02-17: wbc 9.7; hgb 14.5; hct 41.7 ;mcv 84.8; plt 257; glucose 145; bun 13; creat 1.15; k+ 3.5; na++137; ca 9.9; mag 1.8 03-12-17: hgb a1c 9.0    NO NEW LABS    Review of Systems  Constitutional: Negative for malaise/fatigue.  Respiratory: Negative for cough and shortness of breath.   Cardiovascular: Negative for chest pain, palpitations and leg swelling.  Gastrointestinal: Negative for abdominal pain, constipation and heartburn.  Musculoskeletal: Negative for back pain, joint pain and myalgias.  Skin: Negative.   Neurological: Negative for dizziness.  Psychiatric/Behavioral: The patient is not nervous/anxious.       Physical Exam  Constitutional: He is oriented to person, place, and time. He appears well-developed and well-nourished.  No distress.  Neck: Neck supple. Carotid bruit is present. No thyromegaly present.  Right   Cardiovascular:  Murmur heard. 1/6  Pulmonary/Chest: Effort normal and breath sounds normal. No respiratory distress.  Abdominal: Soft. Bowel sounds are normal. He exhibits no distension. There is no tenderness.  Musculoskeletal: He exhibits no edema.  Right hemiplegia   Lymphadenopathy:    He has no cervical adenopathy.  Neurological: He is alert and oriented to person, place, and time.  Skin: Skin is warm and dry. He is not diaphoretic.  Psychiatric: He has a normal mood and affect.   ASSESSMENT/ PLAN:  Patient is being discharged with the following home health services:  none  Patient is being discharged with the following durable medical equipment: front wheel walker with platform to allow him to maintain his current level of independence within the community. Standard wheelchair with elevated leg rests cushion; brake extensions; anti-tippers to allow him to maintain his current level of independence with his adls which cannot be achieved with a standard walker. Can self propel    Patient has been advised to f/u with  their PCP in 1-2 weeks to bring them up to date on their rehab stay.  Social services at facility was responsible for arranging this appointment.  Pt was provided with a 30 day supply of prescriptions for medications and refills must be obtained from their PCP.  For controlled substances, a more limited supply may be provided adequate until PCP appointment only.  A 30 day supply prescriptions written for his medications per the medication list as above.    Time spent with patient: 40 minutes: discharge process; discussed lack of home health; needed dme and how to obtain the needed items being discharge to his sister and medications: verbalized understanding.    Synthia Innocenteborah Crispin Vogel NP [facility]  Contact [phone] Monday through Friday 8am- 5pm  After hours call [phone]

## 2017-04-11 ENCOUNTER — Other Ambulatory Visit: Payer: Self-pay

## 2017-04-13 ENCOUNTER — Other Ambulatory Visit: Payer: Self-pay

## 2017-04-13 NOTE — Patient Outreach (Signed)
Telephone outreach to patient to obtain mRs was successfully completed. mRs= 1. 

## 2017-10-19 ENCOUNTER — Emergency Department (HOSPITAL_COMMUNITY): Payer: Self-pay

## 2017-10-19 ENCOUNTER — Emergency Department (HOSPITAL_COMMUNITY)
Admission: EM | Admit: 2017-10-19 | Discharge: 2017-10-19 | Disposition: A | Discharge status: Home / Self Care | Payer: Self-pay | Attending: Emergency Medicine | Admitting: Emergency Medicine

## 2017-10-19 DIAGNOSIS — Z794 Long term (current) use of insulin: Secondary | ICD-10-CM | POA: Insufficient documentation

## 2017-10-19 DIAGNOSIS — Z7902 Long term (current) use of antithrombotics/antiplatelets: Secondary | ICD-10-CM | POA: Insufficient documentation

## 2017-10-19 DIAGNOSIS — F17200 Nicotine dependence, unspecified, uncomplicated: Secondary | ICD-10-CM | POA: Insufficient documentation

## 2017-10-19 DIAGNOSIS — Z8673 Personal history of transient ischemic attack (TIA), and cerebral infarction without residual deficits: Secondary | ICD-10-CM | POA: Insufficient documentation

## 2017-10-19 DIAGNOSIS — Z79899 Other long term (current) drug therapy: Secondary | ICD-10-CM | POA: Insufficient documentation

## 2017-10-19 DIAGNOSIS — I10 Essential (primary) hypertension: Secondary | ICD-10-CM | POA: Insufficient documentation

## 2017-10-19 DIAGNOSIS — M25571 Pain in right ankle and joints of right foot: Secondary | ICD-10-CM | POA: Insufficient documentation

## 2017-10-19 DIAGNOSIS — E1149 Type 2 diabetes mellitus with other diabetic neurological complication: Secondary | ICD-10-CM | POA: Insufficient documentation

## 2017-10-19 NOTE — ED Provider Notes (Signed)
[facility]-EMERGENCY DEPT Provider Note   CSN: [id number] Arrival date & time: 10/19/17  1706     History   Chief Complaint Chief Complaint  Patient presents with  . Ankle Pain    HPI [name] [name] is a 47 y.o. male with hx of DM, HTN and stroke who presents to the ED with right foot and ankle pain. The pain started about a months ago but has gotten worse over the past few days. Patient reports that he has hit his ankle on the wheel chair several times and has a sore where it knocked the skin off.   HPI  Past Medical History:  Diagnosis Date  . Diabetes mellitus without complication (HCC)   . Hypertension   . Stroke [facility])     Patient Active Problem List   Diagnosis Date Noted  . 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 Essential hypertension, benign 01/16/2017  . Type II diabetes mellitus with neurological manifestations, uncontrolled (HCC) 01/16/2017  . Dyslipidemia associated with type 2 diabetes mellitus (HCC) 01/16/2017  . Depression with anxiety 01/16/2017  . Hemiparesis of dominant side due to recent cerebrovascular accident (CVA) (HCC) 01/04/2017  . Dysarthria due to recent cerebrovascular accident (CVA) 01/04/2017  . Recent cerebrovascular accident (CVA) 01/04/2017  . Stroke [facility]) -  acute infarct at left PLIC, likely small vessel disease due to poorly controlled diabetes and hyperlipidemia. 12/25/2016  . Chronic cerebrovascular accident (CVA) 12/25/2016    Past Surgical History:  Procedure Laterality Date  . APPENDECTOMY    . BACK SURGERY          Home Medications    Prior to Admission medications   Medication Sig Start Date End Date Taking? Authorizing Provider  amLODipine (NORVASC) 10 MG tablet Take 10 mg by mouth at bedtime.     [provider]  atenolol (TENORMIN) 50 MG tablet Take 50 mg by mouth at bedtime.     [provider]  atorvastatin (LIPITOR) 80 MG tablet Take 80 mg by mouth daily.    [provider]  chlorthalidone (HYGROTON) 50 MG tablet Take 50 mg by mouth daily.    [provider]  clopidogrel (PLAVIX) 75 MG tablet Take 1 tablet (75 mg total) by mouth daily. 01/03/17   Patteson, Paul Dykes, NP  folic acid (FOLVITE) 1 MG tablet Take 1 mg by mouth daily.    [provider]  insulin aspart (NOVOLOG) 100 UNIT/ML injection Inject 10 Units into the skin 3 (three) times daily before meals.    [provider]  insulin glargine (LANTUS) 100 UNIT/ML injection Inject 20 Units into the skin at bedtime.    [provider]  lisinopril (PRINIVIL,ZESTRIL) 40 MG tablet Take 40 mg by mouth daily.    [provider]  metFORMIN (GLUCOPHAGE) 500 MG tablet Take 500 mg by mouth daily with breakfast. 03/15/17   [provider]  sertraline (ZOLOFT) 100 MG tablet Take 100 mg by mouth daily.    [provider]    Family History No family history on file.  Social History Social History   Tobacco Use  . Smoking status: Current Every Day Smoker  . Smokeless tobacco: Never Used  Substance Use Topics  . Alcohol use: No  . Drug use: No     Allergies   Penicillins   Review of Systems Review of Systems  Constitutional: Negative for fever.  HENT: Negative.   Musculoskeletal: Positive for arthralgias.  Right ankle pain  Skin: Negative for wound.  Psychiatric/Behavioral: Negative for confusion.     Physical Exam Updated Vital Signs BP (!) 124/91   Pulse 71   Temp 97.9 F (36.6 C) (Oral)   Resp 18   SpO2 97%   Physical Exam  Constitutional: He appears well-developed and well-nourished. No distress.  HENT:  Head: Normocephalic.  Eyes: EOM are normal.  Neck: Neck supple.  Cardiovascular: Normal rate.  Pulmonary/Chest: Effort normal.  Musculoskeletal:       Right ankle: He exhibits swelling and abnormal pulse. He exhibits normal range of motion and no deformity. Tenderness. Lateral malleolus tenderness found.  Achilles tendon normal.  There is a small healing wound to the lateral aspect of the right ankle, no redness or drainage. There is mild swelling of the right ankle. I can do full passive range of motion with minimal pain.  Neurological: He is alert.  Skin: Skin is warm and dry.  Psychiatric: He has a normal mood and affect.  Nursing note and vitals reviewed.    ED Treatments / Results  Labs (all labs ordered are listed, but only abnormal results are displayed) Labs Reviewed - No data to display  Radiology Dg Ankle Complete Right  Result Date: 10/19/2017 CLINICAL DATA:  Right ankle pain and swelling, initial encounter EXAM: RIGHT ANKLE - COMPLETE 3+ VIEW COMPARISON:  None. FINDINGS: Degenerative changes of the tibiotalar joint are noted. Calcaneal spurring is seen. No acute fracture or dislocation is noted. No soft tissue changes are seen. IMPRESSION: Chronic changes without acute abnormality. Electronically Signed   By: Alcide Clever M.D.   On: 10/19/2017 18:13    Procedures Procedures (including critical care time)  Medications Ordered in ED Medications - No data to display   Initial Impression / Assessment and Plan / ED Course  I have reviewed the triage vital signs and the nursing notes. 47 y.o. male with right ankle pain stable for d/c without focal neuro deficits. ASO and f/u with PCP. Tylenol for pain. Discussed with the patient wound care and need for f/u. Patient agrees with plan.  Final Clinical Impressions(s) / ED Diagnoses   Final diagnoses:  Acute right ankle pain    ED Discharge Orders    None       Kerrie Buffalo [place], [place] 10/19/17 Doristine Devoid, MD 10/19/17 [phone]

## 2017-10-19 NOTE — ED Notes (Signed)
Ortho tech contacted regarding ASO brace.

## 2017-10-19 NOTE — Discharge Instructions (Signed)
Take tylenol for pain and follow up with your primary care doctor. Clean the wound on your ankle and use neosporin ointment. Follow up with your doctor for rechedk.

## 2017-10-19 NOTE — ED Triage Notes (Addendum)
Right foot/ankle pain that has been ongoing for the last month. Pain has gotten worse over the last few days. Denies any injury. Pain currently 2/10.

## 2017-11-17 ENCOUNTER — Encounter (HOSPITAL_COMMUNITY): Payer: Self-pay | Admitting: *Deleted

## 2017-11-17 DIAGNOSIS — M79671 Pain in right foot: Secondary | ICD-10-CM | POA: Diagnosis not present

## 2017-11-17 DIAGNOSIS — Z5321 Procedure and treatment not carried out due to patient leaving prior to being seen by health care provider: Secondary | ICD-10-CM | POA: Insufficient documentation

## 2017-11-17 NOTE — ED Triage Notes (Signed)
Pt complains of sores to top of right foot and lateral ankle for the past month, pain became worse 2 weeks ago. Pt denies fever or chills. Pt has hx of stroke, uses walker but has been unable to walk recently due to pain from blisters.

## 2017-11-18 ENCOUNTER — Emergency Department (HOSPITAL_COMMUNITY)
Admission: EM | Admit: 2017-11-18 | Discharge: 2017-11-18 | Disposition: A | Discharge status: Home / Self Care | Payer: Non-veteran care | Source: Home / Self Care | Attending: Emergency Medicine | Admitting: Emergency Medicine

## 2017-11-18 ENCOUNTER — Emergency Department (HOSPITAL_COMMUNITY)
Admission: EM | Admit: 2017-11-18 | Discharge: 2017-11-18 | Discharge status: Against Medical Advice | Payer: Non-veteran care | Attending: Emergency Medicine | Admitting: Emergency Medicine

## 2017-11-18 ENCOUNTER — Encounter (HOSPITAL_COMMUNITY): Payer: Self-pay | Admitting: Nurse Practitioner

## 2017-11-18 DIAGNOSIS — I69354 Hemiplegia and hemiparesis following cerebral infarction affecting left non-dominant side: Secondary | ICD-10-CM | POA: Insufficient documentation

## 2017-11-18 DIAGNOSIS — L959 Vasculitis limited to the skin, unspecified: Secondary | ICD-10-CM | POA: Insufficient documentation

## 2017-11-18 DIAGNOSIS — I10 Essential (primary) hypertension: Secondary | ICD-10-CM

## 2017-11-18 DIAGNOSIS — Z794 Long term (current) use of insulin: Secondary | ICD-10-CM | POA: Insufficient documentation

## 2017-11-18 DIAGNOSIS — E1149 Type 2 diabetes mellitus with other diabetic neurological complication: Secondary | ICD-10-CM

## 2017-11-18 DIAGNOSIS — L03115 Cellulitis of right lower limb: Secondary | ICD-10-CM

## 2017-11-18 DIAGNOSIS — F17210 Nicotine dependence, cigarettes, uncomplicated: Secondary | ICD-10-CM | POA: Insufficient documentation

## 2017-11-18 DIAGNOSIS — Z7901 Long term (current) use of anticoagulants: Secondary | ICD-10-CM

## 2017-11-18 DIAGNOSIS — Z79899 Other long term (current) drug therapy: Secondary | ICD-10-CM

## 2017-11-18 DIAGNOSIS — L97909 Non-pressure chronic ulcer of unspecified part of unspecified lower leg with unspecified severity: Secondary | ICD-10-CM

## 2017-11-18 LAB — CBC
HCT: 43.1 % (ref 39.0–52.0)
Hemoglobin: 15.6 g/dL (ref 13.0–17.0)
MCH: 31.6 pg (ref 26.0–34.0)
MCHC: 36.2 g/dL — ABNORMAL HIGH (ref 30.0–36.0)
MCV: 87.2 fL (ref 78.0–100.0)
PLATELETS: 243 10*3/uL (ref 150–400)
RBC: 4.94 MIL/uL (ref 4.22–5.81)
RDW: 12 % (ref 11.5–15.5)
WBC: 8.9 10*3/uL (ref 4.0–10.5)

## 2017-11-18 LAB — BASIC METABOLIC PANEL
Anion gap: 11 (ref 5–15)
BUN: 25 mg/dL — AB (ref 6–20)
CHLORIDE: 98 mmol/L (ref 98–111)
CO2: 28 mmol/L (ref 22–32)
CREATININE: 1.22 mg/dL (ref 0.61–1.24)
Calcium: 10 mg/dL (ref 8.9–10.3)
Glucose, Bld: 278 mg/dL — ABNORMAL HIGH (ref 70–99)
Potassium: 3.4 mmol/L — ABNORMAL LOW (ref 3.5–5.1)
SODIUM: 137 mmol/L (ref 135–145)

## 2017-11-18 MED ORDER — INSULIN ASPART PROT & ASPART (70-30 MIX) 100 UNIT/ML ~~LOC~~ SUSP: 6.0000 [IU] | Freq: Once | SUBCUTANEOUS | Status: DC

## 2017-11-18 MED ORDER — DOXYCYCLINE HYCLATE 100 MG PO TABS
100.0000 mg | ORAL_TABLET | Freq: Once | ORAL | Status: AC
  Administered 2017-11-18: 100 mg via ORAL
  Filled 2017-11-18: qty 1

## 2017-11-18 MED ORDER — DOXYCYCLINE HYCLATE 100 MG PO CAPS: 100.0000 mg | ORAL_CAPSULE | Freq: Two times a day (BID) | ORAL | 0 refills | Status: DC

## 2017-11-18 MED ORDER — TRAMADOL HCL 50 MG PO TABS: 50.0000 mg | ORAL_TABLET | Freq: Four times a day (QID) | ORAL | 0 refills | Status: DC | PRN

## 2017-11-18 MED ORDER — TRAMADOL HCL 50 MG PO TABS
50.0000 mg | ORAL_TABLET | Freq: Once | ORAL | Status: AC
  Administered 2017-11-18: 50 mg via ORAL
  Filled 2017-11-18: qty 1

## 2017-11-18 MED ORDER — POTASSIUM CHLORIDE CRYS ER 20 MEQ PO TBCR
40.0000 meq | EXTENDED_RELEASE_TABLET | Freq: Once | ORAL | Status: AC
  Administered 2017-11-18: 40 meq via ORAL
  Filled 2017-11-18: qty 2

## 2017-11-18 MED ORDER — INSULIN ASPART 100 UNIT/ML ~~LOC~~ SOLN
6.0000 [IU] | Freq: Once | SUBCUTANEOUS | Status: AC
  Administered 2017-11-18: 6 [IU] via SUBCUTANEOUS
  Filled 2017-11-18: qty 1

## 2017-11-18 NOTE — ED Notes (Signed)
No answer when called for a room. 

## 2017-11-18 NOTE — ED Notes (Addendum)
No answer when called for a room. x3  

## 2017-11-18 NOTE — ED Notes (Signed)
Bed: WA04 Expected date:  Expected time:  Means of arrival:  Comments: 

## 2017-11-18 NOTE — ED Provider Notes (Signed)
[facility]-EMERGENCY DEPT Provider Note   CSN: [id number] Arrival date & time: 11/18/17  1505     History   Chief Complaint No chief complaint on file.   HPI [name] [name] is a 47 y.o. male.  Patient c/o painful rash to right foot for past month. Pt notes sparse, erythematous macules/papules, persistent for past month. Pain dull, mild-moderate, constant. Mild erythema to dorsum foot. Hx dm and vasculitis - pt unsure type of vasculitis - states 1 time previously had developed a rash. Denies any recent new meds or change in meds. No fever or chills. No foot/leg numbness/weakness. No injury to area. No change in any home or personal products.    The history is provided by the patient.    Past Medical History:  Diagnosis Date  . Diabetes mellitus without complication (HCC)   . Hypertension   . Stroke [facility])     Patient Active Problem List   Diagnosis Date Noted  . 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 Essential hypertension, benign 01/16/2017  . Type II diabetes mellitus with neurological manifestations, uncontrolled (HCC) 01/16/2017  . Dyslipidemia associated with type 2 diabetes mellitus (HCC) 01/16/2017  . Depression with anxiety 01/16/2017  . Hemiparesis of dominant side due to recent cerebrovascular accident (CVA) (HCC) 01/04/2017  . Dysarthria due to recent cerebrovascular accident (CVA) 01/04/2017  . Recent cerebrovascular accident (CVA) 01/04/2017  . Stroke [facility]) -  acute infarct at left PLIC, likely small vessel disease due to poorly controlled diabetes and hyperlipidemia. 12/25/2016  . Chronic cerebrovascular accident (CVA) 12/25/2016    Past Surgical History:  Procedure Laterality Date  . APPENDECTOMY    . BACK SURGERY          Home Medications    Prior to Admission medications   Medication Sig Start Date End Date Taking? Authorizing Provider  amLODipine (NORVASC) 10 MG tablet Take 10 mg by mouth at bedtime.     [provider]  atenolol (TENORMIN) 50 MG tablet Take 50 mg by mouth at bedtime.     [provider]  atorvastatin (LIPITOR) 80 MG tablet Take 80 mg by mouth daily.    [provider]  chlorthalidone (HYGROTON) 50 MG tablet Take 50 mg by mouth daily.    [provider]  clopidogrel (PLAVIX) 75 MG tablet Take 1 tablet (75 mg total) by mouth daily. 01/03/17   Patteson, Paul Dykes, NP  folic acid (FOLVITE) 1 MG tablet Take 1 mg by mouth daily.    [provider]  insulin aspart (NOVOLOG) 100 UNIT/ML injection Inject 10 Units into the skin 3 (three) times daily before meals.    [provider]  insulin glargine (LANTUS) 100 UNIT/ML injection Inject 20 Units into the skin at bedtime.    [provider]  lisinopril (PRINIVIL,ZESTRIL) 40 MG tablet Take 40 mg by mouth daily.    [provider]  metFORMIN (GLUCOPHAGE) 500 MG tablet Take 500 mg by mouth daily with breakfast. 03/15/17   [provider]  sertraline (ZOLOFT) 100 MG tablet Take 100 mg by mouth daily.    [provider]    Family History History reviewed. No pertinent family history.  Social History Social History   Tobacco Use  . Smoking status: Current Every Day Smoker  . Smokeless tobacco: Never Used  Substance Use Topics  . Alcohol use: No  . Drug use: No     Allergies   Penicillins   Review of Systems Review  of Systems  Constitutional: Negative for fever.  HENT: Negative for sore throat.   Eyes: Negative for redness.  Respiratory: Negative for shortness of breath.   Cardiovascular: Negative for chest pain.  Gastrointestinal: Negative for abdominal pain, diarrhea and vomiting.  Genitourinary: Negative for dysuria and flank pain.  Musculoskeletal: Negative for back pain and neck pain.  Skin: Positive for rash.  Neurological: Negative for headaches.  Hematological: Does not bruise/bleed easily.  Psychiatric/Behavioral: Negative for  confusion.     Physical Exam Updated Vital Signs BP (!) 124/94 (BP Location: Right Arm)   Pulse 64   Temp 98.5 F (36.9 C) (Oral)   Resp 18   SpO2 98%   Physical Exam  Constitutional: He appears well-developed and well-nourished.  HENT:  Mouth/Throat: Oropharynx is clear and moist.  No mm lesions.   Eyes: Conjunctivae are normal.  Neck: Neck supple. No tracheal deviation present.  Cardiovascular: Normal rate, regular rhythm, normal heart sounds and intact distal pulses.  Pulmonary/Chest: Effort normal and breath sounds normal. No accessory muscle usage. No respiratory distress.  Abdominal: Soft. Bowel sounds are normal. He exhibits no distension. There is no tenderness.  Genitourinary:  Genitourinary Comments: No cva tenderness  Musculoskeletal: He exhibits no edema.  Distal pulses palp bil.   Neurological: He is alert.  Skin: Skin is warm and dry.  Erythema to dorsum right foot, w mild increased warmth c/w mild cellulitis, also w few scattered petechial/vasculitic appearing lesions to dorsum right foot and toes. Normal cap refill distally.   Psychiatric: He has a normal mood and affect.  Nursing note and vitals reviewed.    ED Treatments / Results  Labs (all labs ordered are listed, but only abnormal results are displayed) Results for orders placed or performed during the hospital encounter of 11/18/17  Basic metabolic panel  Result Value Ref Range   Sodium 137 135 - 145 mmol/L   Potassium 3.4 (L) 3.5 - 5.1 mmol/L   Chloride 98 98 - 111 mmol/L   CO2 28 22 - 32 mmol/L   Glucose, Bld 278 (H) 70 - 99 mg/dL   BUN 25 (H) 6 - 20 mg/dL   Creatinine, Ser 4.09 0.61 - 1.24 mg/dL   Calcium 81.1 8.9 - 91.4 mg/dL   GFR calc non Af Amer >60 >60 mL/min   GFR calc Af Amer >60 >60 mL/min   Anion gap 11 5 - 15  CBC  Result Value Ref Range   WBC 8.9 4.0 - 10.5 K/uL   RBC 4.94 4.22 - 5.81 MIL/uL   Hemoglobin 15.6 13.0 - 17.0 g/dL   HCT 78.2 95.6 - 21.3 %   MCV 87.2 78.0 - 100.0  fL   MCH 31.6 26.0 - 34.0 pg   MCHC 36.2 (H) 30.0 - 36.0 g/dL   RDW 08.6 57.8 - 46.9 %   Platelets 243 150 - 400 K/uL   EKG None  Radiology No results found.  Procedures Procedures (including critical care time)  Medications Ordered in ED Medications - No data to display   Initial Impression / Assessment and Plan / ED Course  I have reviewed the triage vital signs and the nursing notes.  Pertinent labs & imaging results that were available during my care of the patient were reviewed by me and considered in my medical decision making (see chart for details).  Labs sent.  Reviewed nursing notes and prior charts for additional history.  Pt noted with hx suspected hsp. Currently no abd or flank pain,  no gu c/o, no arthralgias.   Labs reviewed, glucose elev, hx dm, hco3 normal. k sl low. k po. novolog sq.   Suspect mild superimposed cellulitis. Doxycycline po.   rec pcp f/u in the next couple days.  Pt currently appears stable for d/c.      Final Clinical Impressions(s) / ED Diagnoses   Final diagnoses:  None    ED Discharge Orders    None       Cathren Laine, MD 11/18/17 [phone]

## 2017-11-18 NOTE — Discharge Instructions (Addendum)
It was our pleasure to provide your ER care today - we hope that you feel better.  Take doxycycline (antibiotic) as prescribed.  You may take ultram as need for pain - no driving when taking.  Keep skin area very clean - wash with soap and water 2x/day.   Follow up with your primary care doctor for recheck of area in the next 2-3 days.  From today's lab tests your blood sugar is high (278) and potassium slightly low (3.4) - eat plenty of fruits and vegetables, continue your diabetes medication, and follow up with primary care doctor for recheck.   Return to ER if worse, new symptoms, high fevers, spreading redness, other concern.

## 2017-11-18 NOTE — ED Triage Notes (Signed)
Pt is requesting evaluation for sore on his right toes. Reports hx of vasculitis, diabetes and CVA.

## 2017-11-18 NOTE — ED Notes (Signed)
No answer when called for a room x2 

## 2017-12-06 ENCOUNTER — Other Ambulatory Visit: Payer: Self-pay

## 2017-12-06 ENCOUNTER — Emergency Department (HOSPITAL_COMMUNITY)
Admission: EM | Admit: 2017-12-06 | Discharge: 2017-12-06 | Disposition: A | Discharge status: Home / Self Care | Payer: Non-veteran care | Attending: Emergency Medicine | Admitting: Emergency Medicine

## 2017-12-06 ENCOUNTER — Encounter (HOSPITAL_COMMUNITY): Payer: Self-pay

## 2017-12-06 DIAGNOSIS — F17210 Nicotine dependence, cigarettes, uncomplicated: Secondary | ICD-10-CM | POA: Insufficient documentation

## 2017-12-06 DIAGNOSIS — I10 Essential (primary) hypertension: Secondary | ICD-10-CM | POA: Insufficient documentation

## 2017-12-06 DIAGNOSIS — L03115 Cellulitis of right lower limb: Secondary | ICD-10-CM | POA: Insufficient documentation

## 2017-12-06 DIAGNOSIS — E119 Type 2 diabetes mellitus without complications: Secondary | ICD-10-CM | POA: Insufficient documentation

## 2017-12-06 MED ORDER — TRAMADOL HCL 50 MG PO TABS: 50.0000 mg | ORAL_TABLET | Freq: Four times a day (QID) | ORAL | 0 refills | Status: DC | PRN

## 2017-12-06 MED ORDER — TRAMADOL HCL 50 MG PO TABS
50.0000 mg | ORAL_TABLET | Freq: Once | ORAL | Status: AC
  Administered 2017-12-06: 50 mg via ORAL
  Filled 2017-12-06: qty 1

## 2017-12-06 MED ORDER — SULFAMETHOXAZOLE-TRIMETHOPRIM 800-160 MG PO TABS
1.0000 | ORAL_TABLET | Freq: Once | ORAL | Status: AC
Start: 2017-12-06 — End: 2017-12-06
  Administered 2017-12-06: 1 via ORAL
  Filled 2017-12-06: qty 1

## 2017-12-06 MED ORDER — SULFAMETHOXAZOLE-TRIMETHOPRIM 800-160 MG PO TABS: 1.0000 | ORAL_TABLET | Freq: Two times a day (BID) | ORAL | 0 refills | Status: AC

## 2017-12-06 NOTE — ED Provider Notes (Signed)
Emergency Department Provider Note   I have reviewed the triage vital signs and the nursing notes.   HISTORY  Chief Complaint Foot Pain and foot redness   HPI [name] [name] is a 47 y.o. male with multiple medical problems as documented below the presents to the emergency department today secondary to worsening right foot redness and pain.  Patient was seen here a couple weeks ago start on doxycycline but is too expensive for her to get it filled so did not get it filled.  Symptoms have continued to worsen. No other associated or modifying symptoms.    Past Medical History:  Diagnosis Date  . Diabetes mellitus without complication (HCC)   . Hypertension   . Stroke [facility])     Patient Active Problem List   Diagnosis Date Noted  . 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 Essential hypertension, benign 01/16/2017  . Type II diabetes mellitus with neurological manifestations, uncontrolled (HCC) 01/16/2017  . Dyslipidemia associated with type 2 diabetes mellitus (HCC) 01/16/2017  . Depression with anxiety 01/16/2017  . Hemiparesis of dominant side due to recent cerebrovascular accident (CVA) (HCC) 01/04/2017  . Dysarthria due to recent cerebrovascular accident (CVA) 01/04/2017  . Recent cerebrovascular accident (CVA) 01/04/2017  . Stroke [facility]) -  acute infarct at left PLIC, likely small vessel disease due to poorly controlled diabetes and hyperlipidemia. 12/25/2016  . Chronic cerebrovascular accident (CVA) 12/25/2016    Past Surgical History:  Procedure Laterality Date  . APPENDECTOMY    . BACK SURGERY      Current Outpatient Rx  . Order #: [id number] Class: Historical Med  . Order #: [id number] Class: Historical Med  . Order #: [id number] Class: Historical Med  . Order #: [id number] Class: Print  . Order #: [id number] Class: Historical Med  . Order #: [id number] Class: Historical Med  . Order #: [id number] Class: Historical Med  . Order #: [id number] Class: Historical Med  . Order  #: [id number] Class: Historical Med  . Order #: [id number] Class: Print  . Order #: [id number] Class: Print    Allergies Penicillins  Family History  Problem Relation Age of Onset  . Stroke Mother   . Heart failure Mother   . Heart failure Father   . Diabetes Father     Social History Social History   Tobacco Use  . Smoking status: Current Every Day Smoker    Packs/day: 0.25    Types: Cigarettes  . Smokeless tobacco: Never Used  Substance Use Topics  . Alcohol use: No  . Drug use: No    Review of Systems  All other systems negative except as documented in the HPI. All pertinent positives and negatives as reviewed in the HPI. ____________________________________________   PHYSICAL EXAM:  VITAL SIGNS: ED Triage Vitals  Enc Vitals Group     BP 12/06/17 1121 (!) 144/109     Pulse Rate 12/06/17 1121 85     Resp 12/06/17 1121 12     Temp 12/06/17 1121 98 F (36.7 C)     Temp Source 12/06/17 1121 Oral     SpO2 12/06/17 1121 97 %     Weight 12/06/17 1130 190 lb (86.2 kg)     Height 12/06/17 1130 5\' 9"  (1.753 m)    Constitutional: Alert and oriented. Well appearing and in no acute distress. Eyes: Conjunctivae are normal. PERRL. EOMI. Head: Atraumatic. Nose: No congestion/rhinnorhea. Mouth/Throat: Mucous membranes are moist.  Oropharynx non-erythematous. Neck: No stridor.  No meningeal signs.   Cardiovascular: Normal rate, regular rhythm. Good peripheral circulation. Grossly  normal heart sounds.   Respiratory: Normal respiratory effort.  No retractions. Lungs CTAB. Gastrointestinal: Soft and nontender. No distention.  Musculoskeletal: No lower extremity tenderness nor edema. No gross deformities of extremities. Neurologic:  Normal speech and language. No gross focal neurologic deficits are appreciated.  Skin:  Skin is warm, dry and intact. Erythema to right foot with associated open wounds.   ____________________________________________   INITIAL IMPRESSION /  ASSESSMENT AND PLAN / ED COURSE  Noncompliance with medications 2/2 cost. Will start bactrim instead. Refill ultram. PCP follow up.     Pertinent labs & imaging results that were available during my care of the patient were reviewed by me and considered in my medical decision making (see chart for details).  ____________________________________________  FINAL CLINICAL IMPRESSION(S) / ED DIAGNOSES  Final diagnoses:  None     MEDICATIONS GIVEN DURING THIS VISIT:  Medications  sulfamethoxazole-trimethoprim (BACTRIM DS,SEPTRA DS) 800-160 MG per tablet 1 tablet (1 tablet Oral Given 12/06/17 1212)  traMADol (ULTRAM) tablet 50 mg (50 mg Oral Given 12/06/17 1213)     NEW OUTPATIENT MEDICATIONS STARTED DURING THIS VISIT:  New Prescriptions   No medications on file    Note:  This note was prepared with assistance of Dragon voice recognition software. Occasional wrong-word or sound-a-like substitutions may have occurred due to the inherent limitations of voice recognition software.   Marily Memos, MD 12/06/17 [phone]

## 2017-12-06 NOTE — ED Triage Notes (Addendum)
Patient c/o right foot redness and pain of the right foot and toes. Patient does have an open are on the right 4th toe. Patient states this occurred x 3 months and was seen about a month ago in the ED for the same.   Patient states he was able to pay for Tramadol that was prescribed one month ago,but could not afford the antibiotic that was prescribed.

## 2017-12-06 NOTE — ED Notes (Signed)
Patient verbalized understanding of discharge instructions, no questions. Patient out of ED via electronic wheelchair in no distress.

## 2017-12-06 NOTE — ED Notes (Signed)
Patient refused to change into a hospital gown

## 2018-02-22 ENCOUNTER — Other Ambulatory Visit: Payer: Self-pay

## 2018-02-22 ENCOUNTER — Encounter (HOSPITAL_COMMUNITY): Payer: Self-pay | Admitting: Obstetrics and Gynecology

## 2018-02-22 ENCOUNTER — Emergency Department (HOSPITAL_COMMUNITY): Payer: Self-pay

## 2018-02-22 ENCOUNTER — Emergency Department (HOSPITAL_COMMUNITY)
Admission: EM | Admit: 2018-02-22 | Discharge: 2018-02-22 | Disposition: A | Discharge status: Home / Self Care | Payer: Self-pay | Attending: Emergency Medicine | Admitting: Emergency Medicine

## 2018-02-22 DIAGNOSIS — I10 Essential (primary) hypertension: Secondary | ICD-10-CM | POA: Insufficient documentation

## 2018-02-22 DIAGNOSIS — F17210 Nicotine dependence, cigarettes, uncomplicated: Secondary | ICD-10-CM | POA: Insufficient documentation

## 2018-02-22 DIAGNOSIS — M25511 Pain in right shoulder: Secondary | ICD-10-CM | POA: Insufficient documentation

## 2018-02-22 DIAGNOSIS — M542 Cervicalgia: Secondary | ICD-10-CM | POA: Insufficient documentation

## 2018-02-22 DIAGNOSIS — E119 Type 2 diabetes mellitus without complications: Secondary | ICD-10-CM | POA: Insufficient documentation

## 2018-02-22 MED ORDER — METHOCARBAMOL 500 MG PO TABS: 500.0000 mg | ORAL_TABLET | Freq: Three times a day (TID) | ORAL | 0 refills | Status: DC | PRN

## 2018-02-22 MED ORDER — METHOCARBAMOL 500 MG PO TABS
500.0000 mg | ORAL_TABLET | Freq: Once | ORAL | Status: AC
  Administered 2018-02-22: 500 mg via ORAL
  Filled 2018-02-22: qty 1

## 2018-02-22 NOTE — Discharge Instructions (Addendum)

## 2018-02-22 NOTE — ED Triage Notes (Signed)
Pt reports pain in the back of his neck x2 months that is increasing in pain. Pt reports it is radiating to his right shoulder
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
## 2019-04-11 ENCOUNTER — Other Ambulatory Visit: Payer: Self-pay

## 2019-04-11 ENCOUNTER — Inpatient Hospital Stay
Admission: EM | Admit: 2019-04-11 | Discharge: 2019-04-13 | DRG: 311 | Disposition: A | Discharge status: Home / Self Care | Payer: No Typology Code available for payment source | Attending: Internal Medicine | Admitting: Internal Medicine

## 2019-04-11 DIAGNOSIS — R350 Frequency of micturition: Secondary | ICD-10-CM | POA: Diagnosis present

## 2019-04-11 DIAGNOSIS — E1149 Type 2 diabetes mellitus with other diabetic neurological complication: Secondary | ICD-10-CM | POA: Diagnosis present

## 2019-04-11 DIAGNOSIS — R319 Hematuria, unspecified: Secondary | ICD-10-CM | POA: Diagnosis present

## 2019-04-11 DIAGNOSIS — F17210 Nicotine dependence, cigarettes, uncomplicated: Secondary | ICD-10-CM | POA: Diagnosis present

## 2019-04-11 DIAGNOSIS — Z833 Family history of diabetes mellitus: Secondary | ICD-10-CM

## 2019-04-11 DIAGNOSIS — Z9049 Acquired absence of other specified parts of digestive tract: Secondary | ICD-10-CM

## 2019-04-11 DIAGNOSIS — E1169 Type 2 diabetes mellitus with other specified complication: Secondary | ICD-10-CM | POA: Diagnosis present

## 2019-04-11 DIAGNOSIS — Z8673 Personal history of transient ischemic attack (TIA), and cerebral infarction without residual deficits: Secondary | ICD-10-CM

## 2019-04-11 DIAGNOSIS — I160 Hypertensive urgency: Secondary | ICD-10-CM | POA: Diagnosis present

## 2019-04-11 DIAGNOSIS — E1165 Type 2 diabetes mellitus with hyperglycemia: Secondary | ICD-10-CM | POA: Diagnosis present

## 2019-04-11 DIAGNOSIS — E785 Hyperlipidemia, unspecified: Secondary | ICD-10-CM | POA: Diagnosis present

## 2019-04-11 DIAGNOSIS — R112 Nausea with vomiting, unspecified: Secondary | ICD-10-CM | POA: Diagnosis not present

## 2019-04-11 DIAGNOSIS — E1151 Type 2 diabetes mellitus with diabetic peripheral angiopathy without gangrene: Secondary | ICD-10-CM | POA: Diagnosis present

## 2019-04-11 DIAGNOSIS — Z8249 Family history of ischemic heart disease and other diseases of the circulatory system: Secondary | ICD-10-CM

## 2019-04-11 DIAGNOSIS — IMO0002 Reserved for concepts with insufficient information to code with codable children: Secondary | ICD-10-CM

## 2019-04-11 DIAGNOSIS — Z880 Allergy status to penicillin: Secondary | ICD-10-CM

## 2019-04-11 DIAGNOSIS — Z7902 Long term (current) use of antithrombotics/antiplatelets: Secondary | ICD-10-CM

## 2019-04-11 DIAGNOSIS — I248 Other forms of acute ischemic heart disease: Secondary | ICD-10-CM | POA: Diagnosis not present

## 2019-04-11 DIAGNOSIS — E871 Hypo-osmolality and hyponatremia: Secondary | ICD-10-CM | POA: Diagnosis present

## 2019-04-11 DIAGNOSIS — I214 Non-ST elevation (NSTEMI) myocardial infarction: Secondary | ICD-10-CM | POA: Diagnosis present

## 2019-04-11 DIAGNOSIS — Z823 Family history of stroke: Secondary | ICD-10-CM

## 2019-04-11 DIAGNOSIS — Z79899 Other long term (current) drug therapy: Secondary | ICD-10-CM

## 2019-04-11 DIAGNOSIS — L959 Vasculitis limited to the skin, unspecified: Secondary | ICD-10-CM | POA: Diagnosis present

## 2019-04-11 DIAGNOSIS — Z794 Long term (current) use of insulin: Secondary | ICD-10-CM

## 2019-04-11 DIAGNOSIS — Z20828 Contact with and (suspected) exposure to other viral communicable diseases: Secondary | ICD-10-CM | POA: Diagnosis present

## 2019-04-11 DIAGNOSIS — I10 Essential (primary) hypertension: Secondary | ICD-10-CM | POA: Diagnosis present

## 2019-04-11 DIAGNOSIS — Z79891 Long term (current) use of opiate analgesic: Secondary | ICD-10-CM

## 2019-04-11 LAB — CBC
HCT: 43.6 % (ref 39.0–52.0)
Hemoglobin: 15.1 g/dL (ref 13.0–17.0)
MCH: 29.5 pg (ref 26.0–34.0)
MCHC: 34.6 g/dL (ref 30.0–36.0)
MCV: 85.3 fL (ref 80.0–100.0)
Platelets: 260 10*3/uL (ref 150–400)
RBC: 5.11 MIL/uL (ref 4.22–5.81)
RDW: 11.5 % (ref 11.5–15.5)
WBC: 9.9 10*3/uL (ref 4.0–10.5)
nRBC: 0 % (ref 0.0–0.2)

## 2019-04-11 LAB — BASIC METABOLIC PANEL
Anion gap: 12 (ref 5–15)
BUN: 16 mg/dL (ref 6–20)
CO2: 24 mmol/L (ref 22–32)
Calcium: 9.4 mg/dL (ref 8.9–10.3)
Chloride: 98 mmol/L (ref 98–111)
Creatinine, Ser: 1.05 mg/dL (ref 0.61–1.24)
GFR calc Af Amer: >60 mL/min (ref >60)
GFR calc non Af Amer: >60 mL/min (ref >60)
Glucose, Bld: 336 mg/dL — ABNORMAL HIGH (ref 70–99)
Potassium: 4.1 mmol/L (ref 3.5–5.1)
Sodium: 134 mmol/L — ABNORMAL LOW (ref 135–145)

## 2019-04-11 LAB — TROPONIN I (HIGH SENSITIVITY): Troponin I (High Sensitivity): 221 ng/L (ref <18)

## 2019-04-11 MED ORDER — LACTATED RINGERS IV BOLUS
1000.0000 mL | Freq: Once | INTRAVENOUS | Status: AC
  Administered 2019-04-11: 23:00:00 1000 mL via INTRAVENOUS

## 2019-04-11 MED ORDER — ASPIRIN 81 MG PO CHEW
324.0000 mg | CHEWABLE_TABLET | Freq: Once | ORAL | Status: AC
  Administered 2019-04-11: 324 mg via ORAL
  Filled 2019-04-11: qty 4

## 2019-04-11 NOTE — ED Provider Notes (Signed)
[facility] Emergency Department Provider Note   ____________________________________________   First MD Initiated Contact with Patient 04/11/19 2259     (approximate)  I have reviewed the triage vital signs and the nursing notes.   HISTORY  Chief Complaint Emesis and Rash   HPI [name] [name] is a 48 y.o. male with past medical history of hypertension, diabetes, and stroke who presents to the ED complaining of nausea and vomiting.  Patient reports that he he had sudden onset of nausea with multiple episodes of vomiting around 1030 this evening.  He denies any associated pain and has not had any diarrhea.  He states he was feeling well prior to this episode, has not had any recent fevers, cough, chest pain, or shortness of breath.  He was given Zofran IV by EMS and states he "feels much better".  He reports being compliant with his diabetic medications, has not yet had his evening dose of blood pressure medications.  He also noticed a rash earlier today to his bilateral lower extremities, but is not sure exactly how long it has been there.  He states he has dealt with similar rashes before and was diagnosed with "vasculitis".  He does not currently take medications for this and states he follows at the [facility].        Past Medical History:  Diagnosis Date  . Diabetes mellitus without complication ([place])   . Hypertension   . Stroke [facility])     Patient Active Problem List   Diagnosis Date Noted  . 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 Essential hypertension, benign 01/16/2017  . Type II diabetes mellitus with neurological manifestations, uncontrolled ([place]) 01/16/2017  . Dyslipidemia associated with type 2 diabetes mellitus ([place]) 01/16/2017  . Depression with anxiety 01/16/2017  . Hemiparesis of dominant side due to recent cerebrovascular accident (CVA) ([place]) 01/04/2017  . Dysarthria due to recent cerebrovascular accident (CVA) 01/04/2017  . Recent  cerebrovascular accident (CVA) 01/04/2017  . Stroke [facility]) -  acute infarct at left PLIC, likely small vessel disease due to poorly controlled diabetes and hyperlipidemia. 12/25/2016  . Chronic cerebrovascular accident (CVA) 12/25/2016    Past Surgical History:  Procedure Laterality Date  . APPENDECTOMY    . BACK SURGERY      Prior to Admission medications   Medication Sig Start Date End Date Taking? Authorizing Provider  atenolol (TENORMIN) 50 MG tablet Take 50 mg by mouth at bedtime.     [provider]  Calcium Carb-Cholecalciferol (CALCIUM/VITAMIN D) 500-200 MG-UNIT TABS Take 1 tablet by mouth daily.    [provider]  chlorthalidone (HYGROTON) 50 MG tablet Take 50 mg by mouth daily.    [provider]  clopidogrel (PLAVIX) 75 MG tablet Take 1 tablet (75 mg total) by mouth daily. 01/03/17   Patteson, Arlan Organ, NP  insulin aspart (NOVOLOG) 100 UNIT/ML injection Inject 0-10 Units into the skin 3 (three) times daily as needed for high blood sugar.     [provider]  insulin glargine (LANTUS) 100 UNIT/ML injection Inject 20 Units into the skin at bedtime.    [provider]  losartan (COZAAR) 100 MG tablet Take 100 mg by mouth every evening.    [provider]  methocarbamol (ROBAXIN) 500 MG tablet Take 1 tablet (500 mg total) by mouth every 8 (eight) hours as needed for muscle spasms. 02/22/18   Long, Wonda Olds, MD  Multiple Vitamins-Minerals (CENTRUM FLAVOR BURST ADULT) CHEW Chew 1 each  by mouth daily.    [provider]  Multiple Vitamins-Minerals (MULTIVITAMIN ADULT) TABS Take 1 tablet by mouth daily.    [provider]  traMADol (ULTRAM) 50 MG tablet Take 1 tablet (50 mg total) by mouth every 6 (six) hours as needed. 12/06/17   Mesner, Barbara Cower, MD    Allergies Penicillins  Family History  Problem Relation Age of Onset  . Stroke Mother   . Heart failure Mother   . Heart failure Father   . Diabetes Father      Social History Social History   Tobacco Use  . Smoking status: Current Every Day Smoker    Packs/day: 0.25    Types: Cigarettes  . Smokeless tobacco: Never Used  Substance Use Topics  . Alcohol use: No  . Drug use: No    Review of Systems  Constitutional: No fever/chills Eyes: No visual changes. ENT: No sore throat. Cardiovascular: Denies chest pain. Respiratory: Denies shortness of breath. Gastrointestinal: No abdominal pain.  Positive for nausea and vomiting.  No diarrhea.  No constipation. Genitourinary: Negative for dysuria. Musculoskeletal: Negative for back pain. Skin: Positive for rash. Neurological: Negative for headaches, focal weakness or numbness.  ____________________________________________   PHYSICAL EXAM:  VITAL SIGNS: ED Triage Vitals  Enc Vitals Group     BP 04/11/19 2226 (!) 190/106     Pulse Rate 04/11/19 2225 77     Resp 04/11/19 2222 18     Temp 04/11/19 2226 97.8 F (36.6 C)     Temp Source 04/11/19 2222 Oral     SpO2 04/11/19 2225 97 %     Weight 04/11/19 2221 180 lb (81.6 kg)     Height 04/11/19 2221 5\' 9"  (1.753 m)     Head Circumference --      Peak Flow --      Pain Score 04/11/19 2221 0     Pain Loc --      Pain Edu? --      Excl. in GC? --     Constitutional: Alert and oriented. Eyes: Conjunctivae are normal. Head: Atraumatic. Nose: No congestion/rhinnorhea. Mouth/Throat: Mucous membranes are moist. Neck: Normal ROM Cardiovascular: Normal rate, regular rhythm. Grossly normal heart sounds.  2+ radial and DP pulses bilaterally. Respiratory: Normal respiratory effort.  No retractions. Lungs CTAB. Gastrointestinal: Soft and nontender. No distention. Genitourinary: deferred Musculoskeletal: No lower extremity tenderness nor edema. Neurologic:  Normal speech and language. No gross focal neurologic deficits are appreciated. Skin:  Skin is warm, dry and intact.  Petechial rash noted to bilateral lower extremities extending up  to knees.  No erythema, tenderness, or warmth.  No sloughing of skin noted and no vesicles. Psychiatric: Mood and affect are normal. Speech and behavior are normal.  ____________________________________________   LABS (all labs ordered are listed, but only abnormal results are displayed)  Labs Reviewed  BASIC METABOLIC PANEL - Abnormal; Notable for the following components:      Result Value   Sodium 134 (*)    Glucose, Bld 336 (*)    All other components within normal limits  TROPONIN I (HIGH SENSITIVITY) - Abnormal; Notable for the following components:   Troponin I (High Sensitivity) 221 (*)    All other components within normal limits  SARS CORONAVIRUS 2 (TAT 6-24 HRS)  CBC  HEPATIC FUNCTION PANEL  LIPASE, BLOOD  TROPONIN I (HIGH SENSITIVITY)   ____________________________________________  EKG  ED ECG REPORT I, Chesley Noon, the attending physician, personally viewed and interpreted this ECG.  Date: 04/11/2019  EKG Time: 22:26  Rate: 73  Rhythm: normal sinus rhythm  Axis: Normal  Intervals:none  ST&T Change: None  ED ECG REPORT I, Chesley Noon, the attending physician, personally viewed and interpreted this ECG.   Date: 04/11/2019  EKG Time: 23:42  Rate: 64  Rhythm: normal sinus rhythm  Axis: Normal  Intervals:none  ST&T Change: None    PROCEDURES  Procedure(s) performed (including Critical Care):  Procedures   ____________________________________________   INITIAL IMPRESSION / ASSESSMENT AND PLAN / ED COURSE       48 year old male with history of hypertension, diabetes, and stroke presents to the ED for acute onset of nausea and vomiting around 1030 this evening.  He denies any associated abdominal pain, has not had any diarrhea or other symptoms.  He has a benign and nonfocal abdominal exam, no indication for CT imaging at this time.  He was noted to be hyperglycemic by EMS, will hydrate with IV fluids, check labs to ensure no DKA.  EKG  shows no acute ischemic changes, doubt cardiac etiology.  As for his rash, it is likely recurrent vasculitis similar to his prior episodes.  His platelets are within normal limits, there is no evidence of cellulitis, and there is are no vesicles or evidence of skin sloughing.  Would consider topical steroid cream if work-up in the ED is unremarkable.  Patient noted to have significantly elevated troponin at 221.  He continues to deny chest pain and repeat EKG shows no acute ischemic changes, however given this we will start him on a heparin drip and give aspirin load.  Remainder of labs are unremarkable, no evidence of DKA.  Will discuss with hospitalist for admission.        ____________________________________________   FINAL CLINICAL IMPRESSION(S) / ED DIAGNOSES  Final diagnoses:  Non-intractable vomiting with nausea, unspecified vomiting type  Type II diabetes mellitus with neurological manifestations, uncontrolled (HCC)  NSTEMI (non-ST elevated myocardial infarction) [facility])     ED Discharge Orders    None       Note:  This document was prepared using Dragon voice recognition software and may include unintentional dictation errors.   Chesley Noon, MD 04/12/19 0005

## 2019-04-11 NOTE — ED Notes (Signed)
Date and time results received: 04/11/19 2340 (use smartphrase ".now" to insert current time)  Test: Troponin Critical Value: 221  Name of Provider Notified: Charna Archer, MD, via verbal  Orders Received? Or Actions Taken?: Order placed for chewable aspirin

## 2019-04-11 NOTE — ED Triage Notes (Signed)
Pt with emesis since 2030. Ems gave 4mg  iv zofran. Pt arrives vomiting, rash noted to bilateral legs. fsbs with ems 395.

## 2019-04-12 ENCOUNTER — Inpatient Hospital Stay (HOSPITAL_COMMUNITY)
Admit: 2019-04-12 | Discharge: 2019-04-12 | Disposition: A | Discharge status: Home / Self Care | Payer: No Typology Code available for payment source | Attending: Family Medicine | Admitting: Family Medicine

## 2019-04-12 DIAGNOSIS — L959 Vasculitis limited to the skin, unspecified: Secondary | ICD-10-CM | POA: Diagnosis present

## 2019-04-12 DIAGNOSIS — Z794 Long term (current) use of insulin: Secondary | ICD-10-CM | POA: Diagnosis not present

## 2019-04-12 DIAGNOSIS — E785 Hyperlipidemia, unspecified: Secondary | ICD-10-CM | POA: Diagnosis present

## 2019-04-12 DIAGNOSIS — Z7902 Long term (current) use of antithrombotics/antiplatelets: Secondary | ICD-10-CM | POA: Diagnosis not present

## 2019-04-12 DIAGNOSIS — I10 Essential (primary) hypertension: Secondary | ICD-10-CM | POA: Diagnosis present

## 2019-04-12 DIAGNOSIS — I214 Non-ST elevation (NSTEMI) myocardial infarction: Secondary | ICD-10-CM

## 2019-04-12 DIAGNOSIS — I160 Hypertensive urgency: Secondary | ICD-10-CM

## 2019-04-12 DIAGNOSIS — E1169 Type 2 diabetes mellitus with other specified complication: Secondary | ICD-10-CM | POA: Diagnosis present

## 2019-04-12 DIAGNOSIS — I248 Other forms of acute ischemic heart disease: Secondary | ICD-10-CM | POA: Diagnosis present

## 2019-04-12 DIAGNOSIS — R319 Hematuria, unspecified: Secondary | ICD-10-CM | POA: Diagnosis present

## 2019-04-12 DIAGNOSIS — Z20828 Contact with and (suspected) exposure to other viral communicable diseases: Secondary | ICD-10-CM | POA: Diagnosis present

## 2019-04-12 DIAGNOSIS — E1165 Type 2 diabetes mellitus with hyperglycemia: Secondary | ICD-10-CM

## 2019-04-12 DIAGNOSIS — Z8249 Family history of ischemic heart disease and other diseases of the circulatory system: Secondary | ICD-10-CM | POA: Diagnosis not present

## 2019-04-12 DIAGNOSIS — R350 Frequency of micturition: Secondary | ICD-10-CM | POA: Diagnosis present

## 2019-04-12 DIAGNOSIS — R112 Nausea with vomiting, unspecified: Secondary | ICD-10-CM

## 2019-04-12 DIAGNOSIS — Z9049 Acquired absence of other specified parts of digestive tract: Secondary | ICD-10-CM | POA: Diagnosis not present

## 2019-04-12 DIAGNOSIS — E871 Hypo-osmolality and hyponatremia: Secondary | ICD-10-CM | POA: Diagnosis present

## 2019-04-12 DIAGNOSIS — Z880 Allergy status to penicillin: Secondary | ICD-10-CM | POA: Diagnosis not present

## 2019-04-12 DIAGNOSIS — E1149 Type 2 diabetes mellitus with other diabetic neurological complication: Secondary | ICD-10-CM | POA: Diagnosis present

## 2019-04-12 DIAGNOSIS — E1151 Type 2 diabetes mellitus with diabetic peripheral angiopathy without gangrene: Secondary | ICD-10-CM | POA: Diagnosis present

## 2019-04-12 DIAGNOSIS — Z8673 Personal history of transient ischemic attack (TIA), and cerebral infarction without residual deficits: Secondary | ICD-10-CM | POA: Diagnosis not present

## 2019-04-12 DIAGNOSIS — F17210 Nicotine dependence, cigarettes, uncomplicated: Secondary | ICD-10-CM | POA: Diagnosis present

## 2019-04-12 DIAGNOSIS — Z79899 Other long term (current) drug therapy: Secondary | ICD-10-CM | POA: Diagnosis not present

## 2019-04-12 DIAGNOSIS — Z79891 Long term (current) use of opiate analgesic: Secondary | ICD-10-CM | POA: Diagnosis not present

## 2019-04-12 DIAGNOSIS — Z833 Family history of diabetes mellitus: Secondary | ICD-10-CM | POA: Diagnosis not present

## 2019-04-12 DIAGNOSIS — Z823 Family history of stroke: Secondary | ICD-10-CM | POA: Diagnosis not present

## 2019-04-12 LAB — GLUCOSE, CAPILLARY
Glucose-Capillary: 244 mg/dL — ABNORMAL HIGH (ref 70–99)
Glucose-Capillary: 343 mg/dL — ABNORMAL HIGH (ref 70–99)
Glucose-Capillary: 233 mg/dL — ABNORMAL HIGH (ref 70–99)
Glucose-Capillary: 266 mg/dL — ABNORMAL HIGH (ref 70–99)

## 2019-04-12 LAB — CBC
HCT: 40.5 % (ref 39.0–52.0)
Hemoglobin: 14.1 g/dL (ref 13.0–17.0)
MCH: 29.9 pg (ref 26.0–34.0)
MCHC: 34.8 g/dL (ref 30.0–36.0)
MCV: 86 fL (ref 80.0–100.0)
Platelets: 234 10*3/uL (ref 150–400)
RBC: 4.71 MIL/uL (ref 4.22–5.81)
RDW: 11.5 % (ref 11.5–15.5)
WBC: 11.1 10*3/uL — ABNORMAL HIGH (ref 4.0–10.5)
nRBC: 0 % (ref 0.0–0.2)

## 2019-04-12 LAB — HEPATIC FUNCTION PANEL
ALT: 35 U/L (ref 0–44)
AST: 17 U/L (ref 15–41)
Albumin: 3.4 g/dL — ABNORMAL LOW (ref 3.5–5.0)
Alkaline Phosphatase: 79 U/L (ref 38–126)
Bilirubin, Direct: 0.1 mg/dL (ref 0.0–0.2)
Indirect Bilirubin: 0.7 mg/dL (ref 0.3–0.9)
Total Bilirubin: 0.8 mg/dL (ref 0.3–1.2)
Total Protein: 6.9 g/dL (ref 6.5–8.1)

## 2019-04-12 LAB — HEPARIN LEVEL (UNFRACTIONATED)
Heparin Unfractionated: 0.26 IU/mL — ABNORMAL LOW (ref 0.30–0.70)
Heparin Unfractionated: 0.26 IU/mL — ABNORMAL LOW (ref 0.30–0.70)
Heparin Unfractionated: 0.52 IU/mL (ref 0.30–0.70)

## 2019-04-12 LAB — HEMOGLOBIN A1C
Hgb A1c MFr Bld: 12.5 % — ABNORMAL HIGH (ref 4.8–5.6)
Mean Plasma Glucose: 312.05 mg/dL

## 2019-04-12 LAB — PROTIME-INR
INR: 1 (ref 0.8–1.2)
Prothrombin Time: 12.9 seconds (ref 11.4–15.2)

## 2019-04-12 LAB — ECHOCARDIOGRAM COMPLETE
Height: 69 in
Weight: 2880 oz

## 2019-04-12 LAB — SARS CORONAVIRUS 2 (TAT 6-24 HRS): SARS Coronavirus 2: NEGATIVE

## 2019-04-12 LAB — APTT: aPTT: 24 seconds (ref 24–36)

## 2019-04-12 LAB — HIV ANTIBODY (ROUTINE TESTING W REFLEX): HIV Screen 4th Generation wRfx: NONREACTIVE

## 2019-04-12 LAB — LIPASE, BLOOD: Lipase: 36 U/L (ref 11–51)

## 2019-04-12 LAB — TROPONIN I (HIGH SENSITIVITY): Troponin I (High Sensitivity): 215 ng/L (ref <18)

## 2019-04-12 MED ORDER — INSULIN ASPART 100 UNIT/ML ~~LOC~~ SOLN
0.0000 [IU] | Freq: Three times a day (TID) | SUBCUTANEOUS | Status: DC
  Administered 2019-04-12: 18:00:00 3 [IU] via SUBCUTANEOUS
  Administered 2019-04-12: 7 [IU] via SUBCUTANEOUS
  Filled 2019-04-12 (×3): qty 1

## 2019-04-12 MED ORDER — ACETAMINOPHEN 325 MG PO TABS: 650.0000 mg | ORAL_TABLET | ORAL | Status: DC | PRN

## 2019-04-12 MED ORDER — ALPRAZOLAM 0.5 MG PO TABS: 0.2500 mg | ORAL_TABLET | Freq: Two times a day (BID) | ORAL | Status: DC | PRN

## 2019-04-12 MED ORDER — INFLUENZA VAC SPLIT QUAD 0.5 ML IM SUSY: 0.5000 mL | PREFILLED_SYRINGE | INTRAMUSCULAR | Status: DC

## 2019-04-12 MED ORDER — ADULT MULTIVITAMIN W/MINERALS CH
1.0000 | ORAL_TABLET | Freq: Every day | ORAL | Status: DC
  Administered 2019-04-12 – 2019-04-13 (×2): 1 via ORAL
  Filled 2019-04-12 (×2): qty 1

## 2019-04-12 MED ORDER — LOSARTAN POTASSIUM 50 MG PO TABS
100.0000 mg | ORAL_TABLET | Freq: Once | ORAL | Status: AC
  Administered 2019-04-12: 100 mg via ORAL
  Filled 2019-04-12: qty 2

## 2019-04-12 MED ORDER — ASPIRIN EC 81 MG PO TBEC
81.0000 mg | DELAYED_RELEASE_TABLET | Freq: Every day | ORAL | Status: DC
  Administered 2019-04-13: 09:00:00 81 mg via ORAL
  Filled 2019-04-12: qty 1

## 2019-04-12 MED ORDER — CALCIUM CARBONATE-VITAMIN D 500-200 MG-UNIT PO TABS
1.0000 | ORAL_TABLET | Freq: Every day | ORAL | Status: DC
  Administered 2019-04-12 – 2019-04-13 (×2): 1 via ORAL
  Filled 2019-04-12 (×3): qty 1

## 2019-04-12 MED ORDER — FOLIC ACID 1 MG PO TABS
1.0000 mg | ORAL_TABLET | Freq: Every day | ORAL | Status: DC
  Administered 2019-04-12 – 2019-04-13 (×2): 1 mg via ORAL
  Filled 2019-04-12 (×2): qty 1

## 2019-04-12 MED ORDER — ONDANSETRON HCL 4 MG/2ML IJ SOLN: 4.0000 mg | Freq: Four times a day (QID) | INTRAMUSCULAR | Status: DC | PRN

## 2019-04-12 MED ORDER — ASPIRIN 81 MG PO CHEW: 324.0000 mg | CHEWABLE_TABLET | ORAL | Status: AC

## 2019-04-12 MED ORDER — MORPHINE SULFATE (PF) 2 MG/ML IV SOLN: 2.0000 mg | INTRAVENOUS | Status: DC | PRN

## 2019-04-12 MED ORDER — HEPARIN (PORCINE) 25000 UT/250ML-% IV SOLN
1150.0000 [IU]/h | INTRAVENOUS | Status: DC
  Administered 2019-04-12: 18:00:00 1350 [IU]/h via INTRAVENOUS
  Administered 2019-04-12: 01:00:00 1000 [IU]/h via INTRAVENOUS
  Administered 2019-04-13: 13:00:00 1150 [IU]/h via INTRAVENOUS
  Filled 2019-04-12 (×4): qty 250

## 2019-04-12 MED ORDER — HEPARIN BOLUS VIA INFUSION
1200.0000 [IU] | Freq: Once | INTRAVENOUS | Status: AC
  Administered 2019-04-12: 1200 [IU] via INTRAVENOUS
  Filled 2019-04-12: qty 1200

## 2019-04-12 MED ORDER — PANTOPRAZOLE SODIUM 40 MG IV SOLR
40.0000 mg | Freq: Two times a day (BID) | INTRAVENOUS | Status: DC
  Administered 2019-04-12 – 2019-04-13 (×4): 40 mg via INTRAVENOUS
  Filled 2019-04-12 (×4): qty 40

## 2019-04-12 MED ORDER — CHLORTHALIDONE 25 MG PO TABS
50.0000 mg | ORAL_TABLET | Freq: Every day | ORAL | Status: DC
  Administered 2019-04-12 – 2019-04-13 (×2): 50 mg via ORAL
  Filled 2019-04-12 (×3): qty 2

## 2019-04-12 MED ORDER — ASPIRIN 300 MG RE SUPP: 300.0000 mg | RECTAL | Status: AC

## 2019-04-12 MED ORDER — CLOPIDOGREL BISULFATE 75 MG PO TABS
75.0000 mg | ORAL_TABLET | Freq: Every day | ORAL | Status: DC
  Administered 2019-04-12 – 2019-04-13 (×2): 75 mg via ORAL
  Filled 2019-04-12 (×2): qty 1

## 2019-04-12 MED ORDER — LABETALOL HCL 5 MG/ML IV SOLN
20.0000 mg | INTRAVENOUS | Status: DC | PRN
  Administered 2019-04-13: 01:00:00 20 mg via INTRAVENOUS
  Filled 2019-04-12: qty 4

## 2019-04-12 MED ORDER — CENTRUM FLAVOR BURST ADULT PO CHEW: 1.0000 | CHEWABLE_TABLET | Freq: Every day | ORAL | Status: DC

## 2019-04-12 MED ORDER — ZOLPIDEM TARTRATE 5 MG PO TABS: 5.0000 mg | ORAL_TABLET | Freq: Every evening | ORAL | Status: DC | PRN

## 2019-04-12 MED ORDER — NITROGLYCERIN 0.4 MG SL SUBL: 0.4000 mg | SUBLINGUAL_TABLET | SUBLINGUAL | Status: DC | PRN

## 2019-04-12 MED ORDER — HEPARIN BOLUS VIA INFUSION
4000.0000 [IU] | Freq: Once | INTRAVENOUS | Status: AC
  Administered 2019-04-12: 01:00:00 4000 [IU] via INTRAVENOUS
  Filled 2019-04-12: qty 4000

## 2019-04-12 MED ORDER — INFLUENZA VAC A&B SA ADJ QUAD 0.5 ML IM PRSY: 0.5000 mL | PREFILLED_SYRINGE | INTRAMUSCULAR | Status: DC

## 2019-04-12 MED ORDER — ATENOLOL 50 MG PO TABS
50.0000 mg | ORAL_TABLET | Freq: Every day | ORAL | Status: DC
  Administered 2019-04-12 (×2): 50 mg via ORAL
  Filled 2019-04-12: qty 1
  Filled 2019-04-12: qty 2
  Filled 2019-04-12: qty 1

## 2019-04-12 MED ORDER — INSULIN GLARGINE 100 UNIT/ML ~~LOC~~ SOLN
35.0000 [IU] | Freq: Every day | SUBCUTANEOUS | Status: DC
  Administered 2019-04-12 (×2): 35 [IU] via SUBCUTANEOUS
  Filled 2019-04-12 (×3): qty 0.35

## 2019-04-12 MED ORDER — INSULIN ASPART 100 UNIT/ML ~~LOC~~ SOLN
0.0000 [IU] | Freq: Every day | SUBCUTANEOUS | Status: DC
  Administered 2019-04-12: 22:00:00 3 [IU] via SUBCUTANEOUS
  Filled 2019-04-12: qty 1

## 2019-04-12 MED ORDER — LOSARTAN POTASSIUM 50 MG PO TABS
100.0000 mg | ORAL_TABLET | Freq: Every evening | ORAL | Status: DC
  Administered 2019-04-12: 18:00:00 100 mg via ORAL
  Filled 2019-04-12: qty 2

## 2019-04-12 MED ORDER — SODIUM CHLORIDE 0.9 % IV SOLN: INTRAVENOUS | Status: DC

## 2019-04-12 MED ORDER — INSULIN ASPART 100 UNIT/ML ~~LOC~~ SOLN
0.0000 [IU] | Freq: Three times a day (TID) | SUBCUTANEOUS | Status: DC
  Administered 2019-04-13: 09:00:00 3 [IU] via SUBCUTANEOUS
  Administered 2019-04-13: 13:00:00 5 [IU] via SUBCUTANEOUS
  Filled 2019-04-12 (×2): qty 1

## 2019-04-12 NOTE — Progress Notes (Signed)
ANTICOAGULATION CONSULT NOTE - Initial Consult  Pharmacy Consult for heparin Indication: chest pain/ACS  Allergies  Allergen Reactions  . Penicillins Other (See Comments) and Anaphylaxis    Has patient had a PCN reaction causing immediate rash, facial/tongue/throat swelling, SOB or lightheadedness with hypotension: Yes Has patient had a PCN reaction causing severe rash involving mucus membranes or skin necrosis: No Has patient had a PCN reaction that required hospitalization: No Has patient had a PCN reaction occurring within the last 10 years: No If all of the above answers are "NO", then may proceed with Cephalosporin use.     Patient Measurements: Height: 5\' 9"  (175.3 cm) Weight: 180 lb (81.6 kg) IBW/kg (Calculated) : 70.7 Heparin Dosing Weight: 81.6 kg  Vital Signs: Temp: 97.8 F (36.6 C) (12/18 2226) Temp Source: Oral (12/18 2226) BP: 159/101 (12/18 2330) Pulse Rate: 65 (12/18 2330)  Labs: Recent Labs    04/11/19 2231  HGB 15.1  HCT 43.6  PLT 260  CREATININE 1.05  TROPONINIHS 221*    Estimated Creatinine Clearance: 87.9 mL/min (by C-G formula based on SCr of 1.05 mg/dL).   Medical History: Past Medical History:  Diagnosis Date  . Diabetes mellitus without complication ([place])   . Hypertension   . Stroke [facility])     Medications:  Scheduled:  . heparin  4,000 Units Intravenous Once    Assessment: Patient arrives to ED w/ N/V and rash, was given zofran in ED. EKG's show some ST wave abnormalities, initial trop drawn was 221. No anticoagulation apparent. Patient is being started on heparin drip for presumed NSTEMI.  Goal of Therapy:  Heparin level 0.3-0.7 units/ml Monitor platelets by anticoagulation protocol: Yes   Plan:  Will bolus heparin 4000 units IV x 1 Will start rate at 1000 units/hr. Baseline CBC WNL, aPTT/INR pending. Will check anti-Xa at 0600. Will monitor daily CBC's and adjust per anti-Xa levels.  Tobie Lords, PharmD, BCPS Clinical  Pharmacist 04/12/2019,12:16 AM

## 2019-04-12 NOTE — Consult Note (Signed)
[facility] Cardiology Consultation Note  Patient ID: [name] [name], MRN: [id number], DOB/AGE: [birth year] 48 y.o. Admit date: 04/11/2019   Date of Consult: 04/12/2019 Primary Physician: [facility] Primary Cardiologist: None  Chief Complaint:  Chief Complaint  Patient presents with  . Emesis  . Rash   Reason for Consult: Abdominal chest pain with elevated troponin  HPI: 48 y.o. male with known diabetes hypertension hyperlipidemia and previous peripheral vascular disease status post multiple strokes who claims to have had some chest discomfort last week lasting approximately 2 hours and relieved spontaneously.  This was associated with mild amount of shortness of breath but completely resolved and not having other significant symptoms.  Additionally he had an episode of severe nausea and vomiting which occurred spontaneously.  There was no apparent etiology of this nausea and vomiting and did completely resolve with antinausea medication management and fluid resuscitation.  There was no evidence of chest pain or shortness of breath at that time.  EKG showed normal sinus rhythm with nonspecific ST changes and has not changed throughout his hospital course.  Troponin level peaked at 221 and did not have any other significant changes concerning for myocardial infarction or acute coronary syndrome.  Currently the patient feels well at this time with no further significant symptoms  Past Medical History:  Diagnosis Date  . Diabetes mellitus without complication (HCC)   . Hypertension   . Stroke [facility])       Surgical History:  Past Surgical History:  Procedure Laterality Date  . APPENDECTOMY    . BACK SURGERY       Home Meds: Prior to Admission medications   Medication Sig Start Date End Date Taking? Authorizing Provider  atenolol (TENORMIN) 50 MG tablet Take 50 mg by mouth at bedtime.    Yes [provider]  chlorthalidone (HYGROTON) 50 MG tablet Take 50 mg by  mouth daily.   Yes [provider]  clopidogrel (PLAVIX) 75 MG tablet Take 1 tablet (75 mg total) by mouth daily. 01/03/17  Yes Patteson, Paul Dykes, NP  folic acid (FOLVITE) 1 MG tablet Take 1 mg by mouth daily.   Yes [provider]  insulin glargine (LANTUS) 100 UNIT/ML injection Inject 35 Units into the skin at bedtime.    Yes [provider]  liraglutide (VICTOZA) 18 MG/3ML SOPN Inject into the skin daily.    Yes [provider]  losartan (COZAAR) 100 MG tablet Take 100 mg by mouth every evening.   Yes [provider]  traMADol (ULTRAM) 50 MG tablet Take 1 tablet (50 mg total) by mouth every 6 (six) hours as needed. 12/06/17  Yes Mesner, Barbara Cower, MD  Calcium Carb-Cholecalciferol (CALCIUM/VITAMIN D) 500-200 MG-UNIT TABS Take 1 tablet by mouth daily.    [provider]  insulin aspart (NOVOLOG) 100 UNIT/ML injection Inject 0-10 Units into the skin 3 (three) times daily as needed for high blood sugar.     [provider]  methocarbamol (ROBAXIN) 500 MG tablet Take 1 tablet (500 mg total) by mouth every 8 (eight) hours as needed for muscle spasms. Patient not taking: Reported on 04/12/2019 02/22/18   Long, Arlyss Repress, MD  Multiple Vitamins-Minerals (CENTRUM FLAVOR BURST ADULT) CHEW Chew 1 each by mouth daily.    [provider]  Multiple Vitamins-Minerals (MULTIVITAMIN ADULT) TABS Take 1 tablet by mouth daily.    [provider]    Inpatient Medications:  . aspirin  324 mg Oral NOW   Or  .  aspirin  300 mg Rectal NOW  . [START ON 04/13/2019] aspirin EC  81 mg Oral Daily  . atenolol  50 mg Oral QHS  . calcium-vitamin D  1 tablet Oral Daily  . chlorthalidone  50 mg Oral Daily  . clopidogrel  75 mg Oral Daily  . folic acid  1 mg Oral Daily  . insulin aspart  0-9 Units Subcutaneous TID WC  . insulin glargine  35 Units Subcutaneous QHS  . losartan  100 mg Oral QPM  . multivitamin with minerals  1 tablet Oral Daily  .  pantoprazole (PROTONIX) IV  40 mg Intravenous Q12H   . sodium chloride 75 mL/hr at 04/12/19 0226  . heparin 1,350 Units/hr (04/12/19 1245)    Allergies:  Allergies  Allergen Reactions  . Penicillins Other (See Comments) and Anaphylaxis    Has patient had a PCN reaction causing immediate rash, facial/tongue/throat swelling, SOB or lightheadedness with hypotension: Yes Has patient had a PCN reaction causing severe rash involving mucus membranes or skin necrosis: No Has patient had a PCN reaction that required hospitalization: No Has patient had a PCN reaction occurring within the last 10 years: No If all of the above answers are "NO", then may proceed with Cephalosporin use.     Social History   Socioeconomic History  . Marital status: Divorced    Spouse name: Not on file  . Number of children: Not on file  . Years of education: Not on file  . Highest education level: Not on file  Occupational History  . Not on file  Tobacco Use  . Smoking status: Current Every Day Smoker    Packs/day: 0.25    Types: Cigarettes  . Smokeless tobacco: Never Used  Substance and Sexual Activity  . Alcohol use: No  . Drug use: No  . Sexual activity: Not on file  Other Topics Concern  . Not on file  Social History Narrative  . Not on file   Social Determinants of Health   Financial Resource Strain:   . Difficulty of Paying Living Expenses: Not on file  Food Insecurity:   . Worried About Charity fundraiser in the Last Year: Not on file  . Ran Out of Food in the Last Year: Not on file  Transportation Needs:   . Lack of Transportation (Medical): Not on file  . Lack of Transportation (Non-Medical): Not on file  Physical Activity:   . Days of Exercise per Week: Not on file  . Minutes of Exercise per Session: Not on file  Stress:   . Feeling of Stress : Not on file  Social Connections:   . Frequency of Communication with Friends and Family: Not on file  . Frequency of Social Gatherings  with Friends and Family: Not on file  . Attends Religious Services: Not on file  . Active Member of Clubs or Organizations: Not on file  . Attends Archivist Meetings: Not on file  . Marital Status: Not on file  Intimate Partner Violence:   . Fear of Current or Ex-Partner: Not on file  . Emotionally Abused: Not on file  . Physically Abused: Not on file  . Sexually Abused: Not on file     Family History  Problem Relation Age of Onset  . Stroke Mother   . Heart failure Mother   . Heart failure Father   . Diabetes Father      Review of Systems Positive for nausea vomiting Negative for: General:  chills, fever, night sweats or weight changes.  Cardiovascular: PND orthopnea syncope dizziness  Dermatological skin lesions rashes Respiratory: Cough congestion Urologic: Frequent urination urination at night and hematuria Abdominal: Positive for nausea, vomiting, negative for diarrhea, bright red blood per rectum, melena, or hematemesis Neurologic: negative for visual changes, and/or hearing changes  All other systems reviewed and are otherwise negative except as noted above.  Labs: No results for input(s): CKTOTAL, CKMB, TROPONINI in the last 72 hours. Lab Results  Component Value Date   WBC 11.1 (H) 04/12/2019   HGB 14.1 04/12/2019   HCT 40.5 04/12/2019   MCV 86.0 04/12/2019   PLT 234 04/12/2019    Recent Labs  Lab 04/11/19 2231 04/12/19 0035  NA 134*  --   K 4.1  --   CL 98  --   CO2 24  --   BUN 16  --   CREATININE 1.05  --   CALCIUM 9.4  --   PROT  --  6.9  BILITOT  --  0.8  ALKPHOS  --  79  ALT  --  35  AST  --  17  GLUCOSE 336*  --    Lab Results  Component Value Date   CHOL 108 03/10/2017   HDL 25 (A) 03/10/2017   LDLCALC 39 03/10/2017   TRIG 219 (A) 03/10/2017   No results found for: DDIMER  Radiology/Studies:  No results found.  EKG: Normal sinus rhythm with nonspecific ST changes  Weights: Filed Weights   04/11/19 2221  Weight:  81.6 kg     Physical Exam: Blood pressure (!) 153/101, pulse 62, temperature 97.8 F (36.6 C), temperature source Oral, resp. rate 19, height 5\' 9"  (1.753 m), weight 81.6 kg, SpO2 99 %. Body mass index is 26.58 kg/m. General: Well developed, well nourished, in no acute distress. Head eyes ears nose throat: Normocephalic, atraumatic, sclera non-icteric, no xanthomas, nares are without discharge. No apparent thyromegaly and/or mass  Lungs: Normal respiratory effort.  no wheezes, no rales, no rhonchi.  Heart: RRR with normal S1 S2. no murmur gallop, no rub, PMI is normal size and placement, carotid upstroke normal without bruit, jugular venous pressure is normal Abdomen: Soft, non-tender, non-distended with normoactive bowel sounds. No hepatomegaly. No rebound/guarding. No obvious abdominal masses. Abdominal aorta is normal size without bruit Extremities: No edema. no cyanosis, no clubbing, no ulcers  Peripheral : 2+ bilateral upper extremity pulses, 2+ bilateral femoral pulses, 2+ bilateral dorsal pedal pulse Neuro: Alert and oriented. No facial asymmetry. No focal deficit. Moves all extremities spontaneously. Musculoskeletal: Normal muscle tone without kyphosis Psych:  Responds to questions appropriately with a normal affect.    Assessment: 48 year old male with diabetes hypertension hyperlipidemia and previous peripheral vascular disease with strokes having nausea vomiting and no evidence of acute coronary syndrome or non-ST elevation myocardial infarction but has slightly elevated troponin now fully resolved  Plan: 1.  Continued observation of significant symptoms with continued watching a troponin and EKG 2.  Echocardiogram for LV systolic dysfunction valvular heart disease contributing to above.  If abnormal heart function or structure possibly will further investigate the possibility of cardiovascular disease otherwise will likely medically manage at this time 3.  Continue medication  management for hypertension control without change 4.  Okay for continuation of heparin at this time until further above evaluation 5.  Begin ambulation and follow for further symptoms and concerns  Signed, Lamar Blinks M.D. [facility] Cardiology 04/12/2019, 1:21 PM

## 2019-04-12 NOTE — ED Notes (Signed)
Attempted to call receiving RN. Was on hold for over 7 minutes.

## 2019-04-12 NOTE — ED Notes (Signed)
Spoke with dr. Charna Archer regarding pt's continued htn. Order for losartan received.

## 2019-04-12 NOTE — Progress Notes (Signed)
[place] for heparin Indication: chest pain/ACS  Allergies  Allergen Reactions  . Penicillins Other (See Comments) and Anaphylaxis    Has patient had a PCN reaction causing immediate rash, facial/tongue/throat swelling, SOB or lightheadedness with hypotension: Yes Has patient had a PCN reaction causing severe rash involving mucus membranes or skin necrosis: No Has patient had a PCN reaction that required hospitalization: No Has patient had a PCN reaction occurring within the last 10 years: No If all of the above answers are "NO", then may proceed with Cephalosporin use.     Patient Measurements: Height: 5\' 9"  (175.3 cm) Weight: 180 lb (81.6 kg) IBW/kg (Calculated) : 70.7 Heparin Dosing Weight: 81.6 kg  Vital Signs: Temp: 98.2 F (36.8 C) (12/19 1923) Temp Source: Oral (12/19 1923) BP: 158/100 (12/19 1923) Pulse Rate: 67 (12/19 1923)  Labs: Recent Labs    04/11/19 2231 04/12/19 0035 04/12/19 0527 04/12/19 1142 04/12/19 1839  HGB 15.1  --  14.1  --   --   HCT 43.6  --  40.5  --   --   PLT 260  --  234  --   --   APTT  --  24  --   --   --   LABPROT  --  12.9  --   --   --   INR  --  1.0  --   --   --   HEPARINUNFRC  --   --  0.26* 0.26* 0.52  CREATININE 1.05  --   --   --   --   TROPONINIHS 221* 215*  --   --   --     Estimated Creatinine Clearance: 87.9 mL/min (by C-G formula based on SCr of 1.05 mg/dL).   Medical History: Past Medical History:  Diagnosis Date  . Diabetes mellitus without complication ([place])   . Hypertension   . Stroke [facility])     Medications:  Scheduled:  . aspirin  324 mg Oral NOW   Or  . aspirin  300 mg Rectal NOW  . [START ON 04/13/2019] aspirin EC  81 mg Oral Daily  . atenolol  50 mg Oral QHS  . calcium-vitamin D  1 tablet Oral Daily  . chlorthalidone  50 mg Oral Daily  . clopidogrel  75 mg Oral Daily  . folic acid  1 mg Oral Daily  . insulin aspart  0-9 Units Subcutaneous TID WC  . insulin  glargine  35 Units Subcutaneous QHS  . losartan  100 mg Oral QPM  . multivitamin with minerals  1 tablet Oral Daily  . pantoprazole (PROTONIX) IV  40 mg Intravenous Q12H    Assessment: Patient arrives to ED w/ N/V and rash, was given zofran in ED. EKG's show some ST wave abnormalities, initial trop drawn was 221. No anticoagulation apparent. Patient is being started on heparin drip for presumed NSTEMI.  Goal of Therapy:  Heparin level 0.3-0.7 units/ml Monitor platelets by anticoagulation protocol: Yes   Plan:  12/19 @ 1839, HL = 0.52.  Therapeutic x1.  Continue current heparin rate of 1350 units/hr.   Will recheck HL at [place] monitor daily CBC's and adjust per anti-Xa levels.  [name] [name], [facility] Clinical Pharmacist 04/12/2019,7:46 PM

## 2019-04-12 NOTE — Progress Notes (Signed)
Brief nonbillable note Please see H&P from Dr. Sidney Ace for full details  Patient was seen in the emergency department.  He is resting comfortably in bed.  Remains on heparin infusion.  He is chest pain-free.  Nausea and vomiting has improved substantially.  Patient has been seen by cardiology was recommended continued observation with medical management on heparin infusion.  Currently no plans for intervention.  Will advance diet.  Continue to monitor on telemetry overnight.  Will reevaluate in the morning.  Begin ambulate with assistance and assess.  Bodi Palmeri [employer]

## 2019-04-12 NOTE — Progress Notes (Signed)
[place] for heparin Indication: chest pain/ACS  Allergies  Allergen Reactions  . Penicillins Other (See Comments) and Anaphylaxis    Has patient had a PCN reaction causing immediate rash, facial/tongue/throat swelling, SOB or lightheadedness with hypotension: Yes Has patient had a PCN reaction causing severe rash involving mucus membranes or skin necrosis: No Has patient had a PCN reaction that required hospitalization: No Has patient had a PCN reaction occurring within the last 10 years: No If all of the above answers are "NO", then may proceed with Cephalosporin use.     Patient Measurements: Height: 5\' 9"  (175.3 cm) Weight: 180 lb (81.6 kg) IBW/kg (Calculated) : 70.7 Heparin Dosing Weight: 81.6 kg  Vital Signs: Temp: 97.8 F (36.6 C) (12/19 0828) Temp Source: Oral (12/19 0828) BP: 153/101 (12/19 0828) Pulse Rate: 62 (12/19 0828)  Labs: Recent Labs    04/11/19 2231 04/12/19 0035 04/12/19 0527 04/12/19 1142  HGB 15.1  --  14.1  --   HCT 43.6  --  40.5  --   PLT 260  --  234  --   APTT  --  24  --   --   LABPROT  --  12.9  --   --   INR  --  1.0  --   --   HEPARINUNFRC  --   --  0.26* 0.26*  CREATININE 1.05  --   --   --   TROPONINIHS 221* 215*  --   --     Estimated Creatinine Clearance: 87.9 mL/min (by C-G formula based on SCr of 1.05 mg/dL).   Medical History: Past Medical History:  Diagnosis Date  . Diabetes mellitus without complication ([place])   . Hypertension   . Stroke [facility])     Medications:  Scheduled:  . aspirin  324 mg Oral NOW   Or  . aspirin  300 mg Rectal NOW  . [START ON 04/13/2019] aspirin EC  81 mg Oral Daily  . atenolol  50 mg Oral QHS  . calcium-vitamin D  1 tablet Oral Daily  . chlorthalidone  50 mg Oral Daily  . clopidogrel  75 mg Oral Daily  . folic acid  1 mg Oral Daily  . heparin  1,200 Units Intravenous Once  . insulin aspart  0-9 Units Subcutaneous TID WC  . insulin glargine  35  Units Subcutaneous QHS  . losartan  100 mg Oral QPM  . multivitamin with minerals  1 tablet Oral Daily  . pantoprazole (PROTONIX) IV  40 mg Intravenous Q12H    Assessment: Patient arrives to ED w/ N/V and rash, was given zofran in ED. EKG's show some ST wave abnormalities, initial trop drawn was 221. No anticoagulation apparent. Patient is being started on heparin drip for presumed NSTEMI.  Goal of Therapy:  Heparin level 0.3-0.7 units/ml Monitor platelets by anticoagulation protocol: Yes   Plan:  12/19 @ 11:42, HL = 0.26.   Ordered a bolus of 1200 units and increased drip rate to 1350 units/hr.   Will recheck HL tonight at 18:45. Will monitor daily CBC's and adjust per anti-Xa levels.  Olivia Canter, [facility] Clinical Pharmacist 04/12/2019,12:35 PM

## 2019-04-12 NOTE — Progress Notes (Signed)
ANTICOAGULATION CONSULT NOTE - Initial Consult  Pharmacy Consult for heparin Indication: chest pain/ACS  Allergies  Allergen Reactions  . Penicillins Other (See Comments) and Anaphylaxis    Has patient had a PCN reaction causing immediate rash, facial/tongue/throat swelling, SOB or lightheadedness with hypotension: Yes Has patient had a PCN reaction causing severe rash involving mucus membranes or skin necrosis: No Has patient had a PCN reaction that required hospitalization: No Has patient had a PCN reaction occurring within the last 10 years: No If all of the above answers are "NO", then may proceed with Cephalosporin use.     Patient Measurements: Height: 5\' 9"  (175.3 cm) Weight: 180 lb (81.6 kg) IBW/kg (Calculated) : 70.7 Heparin Dosing Weight: 81.6 kg  Vital Signs: Temp: 97.8 F (36.6 C) (12/18 2226) Temp Source: Oral (12/18 2226) BP: 134/94 (12/19 0528) Pulse Rate: 62 (12/19 0528)  Labs: Recent Labs    04/11/19 2231 04/12/19 0035 04/12/19 0527  HGB 15.1  --  14.1  HCT 43.6  --  40.5  PLT 260  --  234  APTT  --  24  --   LABPROT  --  12.9  --   INR  --  1.0  --   HEPARINUNFRC  --   --  0.26*  CREATININE 1.05  --   --   TROPONINIHS 221* 215*  --     Estimated Creatinine Clearance: 87.9 mL/min (by C-G formula based on SCr of 1.05 mg/dL).   Medical History: Past Medical History:  Diagnosis Date  . Diabetes mellitus without complication ([place])   . Hypertension   . Stroke [facility])     Medications:  Scheduled:  . aspirin  324 mg Oral NOW   Or  . aspirin  300 mg Rectal NOW  . [START ON 04/13/2019] aspirin EC  81 mg Oral Daily  . atenolol  50 mg Oral QHS  . Calcium/Vitamin D  1 tablet Oral Daily  . chlorthalidone  50 mg Oral Daily  . clopidogrel  75 mg Oral Daily  . folic acid  1 mg Oral Daily  . insulin aspart  0-9 Units Subcutaneous TID WC  . insulin glargine  35 Units Subcutaneous QHS  . losartan  100 mg Oral QPM  . multivitamin with minerals  1  tablet Oral Daily  . pantoprazole (PROTONIX) IV  40 mg Intravenous Q12H    Assessment: Patient arrives to ED w/ N/V and rash, was given zofran in ED. EKG's show some ST wave abnormalities, initial trop drawn was 221. No anticoagulation apparent. Patient is being started on heparin drip for presumed NSTEMI.  Goal of Therapy:  Heparin level 0.3-0.7 units/ml Monitor platelets by anticoagulation protocol: Yes   Plan:  12/19 @ 0530 HL 0.26 subtherapeutic. Will increase rate to 1200 units/hr and will recheck HL @ 1200, CBC stable will continue to monitor.  Tobie Lords, PharmD, BCPS Clinical Pharmacist 04/12/2019,6:27 AM

## 2019-04-12 NOTE — ED Notes (Signed)
Admitting MD at bedside.

## 2019-04-12 NOTE — H&P (Addendum)
[place] at [facility]   PATIENT NAME: [name] [name]    MR#:  [id number]  DATE OF BIRTH:  Feb 01, 1971  DATE OF ADMISSION: 04/12/2019  PRIMARY CARE PHYSICIAN: [facility]   REQUESTING/REFERRING PHYSICIAN: Chesley Noon, MD CHIEF COMPLAINT:   Chief Complaint  Patient presents with  . Emesis  . Rash    HISTORY OF PRESENT ILLNESS:  [name] [name]  is a 48 y.o. Caucasian male with a known history of diabetes mellitus, hypertension and CVA, who presented to the emergency room with onset of intractable nausea and vomiting 8:30 PM this evening.  He denied chest pain or palpitations.  No bilious vomitus or hematemesis.  He denied any abdominal pain.  He had any cough or wheezing or hemoptysis or palpitations.  No leg pain or edema recent travels or surgeries.  The patient was given IV Zofran by EMS and felt significantly better.  He also noted a rash in both lower extremities which is nonpruritic and has not known how long it has been there.  He has had similar rashes before and was diagnosed with vasculitis.  He follows at the [facility] and states that he is not taking medications for it.  Upon presentation to the emergency room, blood pressure was 190/106 with otherwise normal vital signs.  Labs revealed mild hyponatremia 134 and hyperglycemia of 336.  Serum lipase was normal at 36.  CBC was within normal.  High-sensitivity Troponin I came back significantly elevated 221 and later 215.  She showed normal sinus rhythm with rate of 73 and later 64 with poor R progression.  The patient was given 4 baby aspirin 1 L of IV lactated Ringer 100 mg of p.o. Cozaar.  He was then given a bolus of IV heparin followed by IV heparin drip.  He will be admitted to telemetry bed for further evaluation and management. PAST MEDICAL HISTORY:   Past Medical History:  Diagnosis Date  . Diabetes mellitus without complication (HCC)   . Hypertension   . Stroke [facility]: [facility])     PAST SURGICAL  HISTORY:   Past Surgical History:  Procedure Laterality Date  . APPENDECTOMY    . BACK SURGERY      SOCIAL HISTORY:   Social History   Tobacco Use  . Smoking status: Current Every Day Smoker    Packs/day: 0.25    Types: Cigarettes  . Smokeless tobacco: Never Used  Substance Use Topics  . Alcohol use: No    FAMILY HISTORY:   Family History  Problem Relation Age of Onset  . Stroke Mother   . Heart failure Mother   . Heart failure Father   . Diabetes Father     DRUG ALLERGIES:   Allergies  Allergen Reactions  . Penicillins Other (See Comments) and Anaphylaxis    Has patient had a PCN reaction causing immediate rash, facial/tongue/throat swelling, SOB or lightheadedness with hypotension: Yes Has patient had a PCN reaction causing severe rash involving mucus membranes or skin necrosis: No Has patient had a PCN reaction that required hospitalization: No Has patient had a PCN reaction occurring within the last 10 years: No If all of the above answers are "NO", then may proceed with Cephalosporin use.     REVIEW OF SYSTEMS:   ROS As per history of present illness. All pertinent systems were reviewed above. Constitutional,  HEENT, cardiovascular, respiratory, GI, GU, musculoskeletal, neuro, psychiatric, endocrine,  integumentary and hematologic systems were reviewed and are otherwise  negative/unremarkable except  for positive findings mentioned above in the HPI.   MEDICATIONS AT HOME:   Prior to Admission medications   Medication Sig Start Date End Date Taking? Authorizing Provider  atenolol (TENORMIN) 50 MG tablet Take 50 mg by mouth at bedtime.     [provider]  Calcium Carb-Cholecalciferol (CALCIUM/VITAMIN D) 500-200 MG-UNIT TABS Take 1 tablet by mouth daily.    [provider]  chlorthalidone (HYGROTON) 50 MG tablet Take 50 mg by mouth daily.    [provider]  clopidogrel (PLAVIX) 75 MG tablet Take 1 tablet (75 mg total) by mouth  daily. 01/03/17   Patteson, Paul Dykes, NP  insulin aspart (NOVOLOG) 100 UNIT/ML injection Inject 0-10 Units into the skin 3 (three) times daily as needed for high blood sugar.     [provider]  insulin glargine (LANTUS) 100 UNIT/ML injection Inject 20 Units into the skin at bedtime.    [provider]  losartan (COZAAR) 100 MG tablet Take 100 mg by mouth every evening.    [provider]  methocarbamol (ROBAXIN) 500 MG tablet Take 1 tablet (500 mg total) by mouth every 8 (eight) hours as needed for muscle spasms. 02/22/18   Long, Arlyss Repress, MD  Multiple Vitamins-Minerals (CENTRUM FLAVOR BURST ADULT) CHEW Chew 1 each by mouth daily.    [provider]  Multiple Vitamins-Minerals (MULTIVITAMIN ADULT) TABS Take 1 tablet by mouth daily.    [provider]  traMADol (ULTRAM) 50 MG tablet Take 1 tablet (50 mg total) by mouth every 6 (six) hours as needed. 12/06/17   Mesner, Barbara Cower, MD      VITAL SIGNS:  Blood pressure (!) 159/101, pulse 65, temperature 97.8 F (36.6 C), temperature source Oral, resp. rate 17, height 5\' 9"  (1.753 m), weight 81.6 kg, SpO2 92 %.  PHYSICAL EXAMINATION:  Physical Exam  GENERAL:  48 y.o.-year-old patient Caucasian male lying in the bed with no acute distress.  EYES: Pupils equal, round, reactive to light and accommodation. No scleral icterus. Extraocular muscles intact.  HEENT: Head atraumatic, normocephalic. Oropharynx and nasopharynx clear.  NECK:  Supple, no jugular venous distention. No thyroid enlargement, no tenderness.  LUNGS: Normal breath sounds bilaterally, no wheezing, rales,rhonchi or crepitation. No use of accessory muscles of respiration.  CARDIOVASCULAR: Regular rate and rhythm, S1, S2 normal. No murmurs, rubs, or gallops.  ABDOMEN: Soft, nondistended, nontender. Bowel sounds present. No organomegaly or mass.  EXTREMITIES: No pedal edema, cyanosis, or clubbing.  NEUROLOGIC: Cranial nerves II through XII are  intact. Muscle strength 5/5 in all extremities. Sensation intact. Gait not checked.  PSYCHIATRIC: The patient is alert and oriented x 3.  Normal affect and good eye contact. SKIN: Erythematous pinpointed eruption over both lower legs that are nonblanching concerning for vasculitic eruption. LABORATORY PANEL:   CBC Recent Labs  Lab 04/11/19 2231  WBC 9.9  HGB 15.1  HCT 43.6  PLT 260   ------------------------------------------------------------------------------------------------------------------  Chemistries  Recent Labs  Lab 04/11/19 2231  NA 134*  K 4.1  CL 98  CO2 24  GLUCOSE 336*  BUN 16  CREATININE 1.05  CALCIUM 9.4   ------------------------------------------------------------------------------------------------------------------  Cardiac Enzymes No results for input(s): TROPONINI in the last 168 hours. ------------------------------------------------------------------------------------------------------------------  RADIOLOGY:  No results found.    IMPRESSION AND PLAN:   1.  Intractable nausea and vomiting with associated elevated troponin I concerning for non-STEMI that could be related to inferior MI.  The patient has apparently uncontrolled type 2 diabetes mellitus that could be  associated with peripheral neuropathy making it silent without chest pain.  The patient will be admitted to a telemetry bed.  We will continue aspirin and Plavix as well as beta-blocker therapy, ARB and continue IV heparin.  We will add statin therapy and check fasting lipids.  Will obtain a 2D echo and a cardiology consultation in a.m.  I notified Dr. Nehemiah Massed about the patient. The patient will be placed on as needed morphine sulfate and sublingual nitroglycerin if he has any chest pain. As needed antiemetics will be provided.   The patient will be placed on IV PPI therapy.   2.  Uncontrolled type II diabetes mellitus with hyperglycemia.  The patient will be placed on supplement  coverage with NovoLog and will continue basal coverage at half home dose while he is n.p.o.  3.  Hypertensive urgency.  The patient will be placed on as needed IV labetalol and will continue Cozaar as well as beta-blocker therapy with atenolol.  4.  Tobacco abuse.  The patient will have smoking cessation counseling and education.  5.  DVT prophylaxis.  The patient will be on IV heparin  All the records are reviewed and case discussed with ED provider. The plan of care was discussed in details with the patient (and family). I answered all questions. The patient agreed to proceed with the above mentioned plan. Further management will depend upon hospital course.   CODE STATUS: Full code  TOTAL TIME TAKING CARE OF THIS PATIENT: 55 minutes.    Christel Mormon M.D on 04/12/2019 at 12:12 AM  [facility]   From 7 PM-7 AM, contact night-coverage [email or web address]  CC: Primary care physician; [place]   Note: This dictation was prepared with Dragon dictation along with smaller phrase technology. Any transcriptional errors that result from this process are unintentional.

## 2019-04-12 NOTE — Progress Notes (Signed)
Nurse reports CBG 266, no bedtime coverage order. Patietn on sensitive scale and no morning hypoglycemia reported so sensitive bedtime sliding scale added

## 2019-04-13 ENCOUNTER — Encounter: Payer: Self-pay | Admitting: Family Medicine

## 2019-04-13 DIAGNOSIS — I248 Other forms of acute ischemic heart disease: Principal | ICD-10-CM

## 2019-04-13 LAB — CBC
HCT: 41.2 % (ref 39.0–52.0)
Hemoglobin: 14.7 g/dL (ref 13.0–17.0)
MCH: 29.3 pg (ref 26.0–34.0)
MCHC: 35.7 g/dL (ref 30.0–36.0)
MCV: 82.1 fL (ref 80.0–100.0)
Platelets: 234 10*3/uL (ref 150–400)
RBC: 5.02 MIL/uL (ref 4.22–5.81)
RDW: 11.7 % (ref 11.5–15.5)
WBC: 10.4 10*3/uL (ref 4.0–10.5)
nRBC: 0 % (ref 0.0–0.2)

## 2019-04-13 LAB — HEPARIN LEVEL (UNFRACTIONATED)
Heparin Unfractionated: 0.8 IU/mL — ABNORMAL HIGH (ref 0.30–0.70)
Heparin Unfractionated: 0.59 IU/mL (ref 0.30–0.70)
Heparin Unfractionated: 0.42 IU/mL (ref 0.30–0.70)

## 2019-04-13 LAB — TROPONIN I (HIGH SENSITIVITY)
Troponin I (High Sensitivity): 146 ng/L (ref <18)
Troponin I (High Sensitivity): 131 ng/L (ref <18)

## 2019-04-13 LAB — GLUCOSE, CAPILLARY
Glucose-Capillary: 203 mg/dL — ABNORMAL HIGH (ref 70–99)
Glucose-Capillary: 282 mg/dL — ABNORMAL HIGH (ref 70–99)

## 2019-04-13 MED ORDER — ASPIRIN 81 MG PO TBEC: 81.0000 mg | DELAYED_RELEASE_TABLET | Freq: Every day | ORAL | 0 refills | Status: AC

## 2019-04-13 NOTE — Progress Notes (Signed)
Pt ambulated with walker, he stated he uses one at home and felt like he was well enough to go home today. Sreenath MD made aware.

## 2019-04-13 NOTE — Progress Notes (Signed)
ANTICOAGULATION CONSULT NOTE   Pharmacy Consult for heparin Indication: chest pain/ACS  Allergies  Allergen Reactions  . Penicillins Other (See Comments) and Anaphylaxis    Has patient had a PCN reaction causing immediate rash, facial/tongue/throat swelling, SOB or lightheadedness with hypotension: Yes Has patient had a PCN reaction causing severe rash involving mucus membranes or skin necrosis: No Has patient had a PCN reaction that required hospitalization: No Has patient had a PCN reaction occurring within the last 10 years: No If all of the above answers are "NO", then may proceed with Cephalosporin use.     Patient Measurements: Height: 5\' 9"  (175.3 cm) Weight: 196 lb 12.8 oz (89.3 kg) IBW/kg (Calculated) : 70.7 Heparin Dosing Weight: 81.6 kg  Vital Signs: Temp: 97.4 F (36.3 C) (12/20 0816) Temp Source: Oral (12/20 0816) BP: 155/103 (12/20 0816) Pulse Rate: 64 (12/20 0816)  Labs: Recent Labs    04/11/19 2231 04/11/19 2231 04/12/19 0035 04/12/19 0527 04/12/19 1839 04/13/19 0031 04/13/19 0843  HGB 15.1  --   --  14.1  --   --  14.7  HCT 43.6  --   --  40.5  --   --  41.2  PLT 260  --   --  234  --   --  234  APTT  --   --  24  --   --   --   --   LABPROT  --   --  12.9  --   --   --   --   INR  --   --  1.0  --   --   --   --   HEPARINUNFRC  --    < >  --  0.26* 0.52 0.80* 0.59  CREATININE 1.05  --   --   --   --   --   --   TROPONINIHS 221*  --  215*  --   --   --   --    < > = values in this interval not displayed.    Estimated Creatinine Clearance: 97.1 mL/min (by C-G formula based on SCr of 1.05 mg/dL).   Medical History: Past Medical History:  Diagnosis Date  . Diabetes mellitus without complication (HCC)   . Hypertension   . Stroke [facility])     Medications:  Scheduled:  . aspirin EC  81 mg Oral Daily  . atenolol  50 mg Oral QHS  . calcium-vitamin D  1 tablet Oral Daily  . chlorthalidone  50 mg Oral Daily  . clopidogrel  75 mg Oral Daily  .  folic acid  1 mg Oral Daily  . influenza vac split quadrivalent PF  0.5 mL Intramuscular Tomorrow-1000  . insulin aspart  0-5 Units Subcutaneous QHS  . insulin aspart  0-9 Units Subcutaneous TID WC  . insulin glargine  35 Units Subcutaneous QHS  . losartan  100 mg Oral QPM  . multivitamin with minerals  1 tablet Oral Daily  . pantoprazole (PROTONIX) IV  40 mg Intravenous Q12H    Assessment: Patient arrives to ED w/ N/V and rash, was given zofran in ED. EKG's show some ST wave abnormalities, initial trop drawn was 221. No anticoagulation apparent. Patient is being started on heparin drip for presumed NSTEMI.  Goal of Therapy:  Heparin level 0.3-0.7 units/ml Monitor platelets by anticoagulation protocol: Yes   Plan:  12/20 @ 0030 HL 0.80 supratherapeutic. Per RN states patient had accidentally pulled out IV and bleed a decent  amount. Will reduce rate to 1150 units/hr and will recheck HL 0800, Hgb trended down by a unit, but stable will f/u w/ am labs.   12/20 @ 08:43, HL = 0.59.  Continue current drip rate. Recheck HL today at 15:00.  Olivia Canter, [facility] Clinical Pharmacist 04/13/2019,10:27 AM

## 2019-04-13 NOTE — Progress Notes (Signed)
[place] for heparin Indication: chest pain/ACS  Allergies  Allergen Reactions  . Penicillins Other (See Comments) and Anaphylaxis    Has patient had a PCN reaction causing immediate rash, facial/tongue/throat swelling, SOB or lightheadedness with hypotension: Yes Has patient had a PCN reaction causing severe rash involving mucus membranes or skin necrosis: No Has patient had a PCN reaction that required hospitalization: No Has patient had a PCN reaction occurring within the last 10 years: No If all of the above answers are "NO", then may proceed with Cephalosporin use.     Patient Measurements: Height: 5\' 9"  (175.3 cm) Weight: 180 lb (81.6 kg) IBW/kg (Calculated) : 70.7 Heparin Dosing Weight: 81.6 kg  Vital Signs: Temp: 98.2 F (36.8 C) (12/19 1923) Temp Source: Oral (12/19 1923) BP: 151/100 (12/20 0056) Pulse Rate: 62 (12/20 0056)  Labs: Recent Labs    04/11/19 2231 04/12/19 0035 04/12/19 0527 04/12/19 1142 04/12/19 1839 04/13/19 0031  HGB 15.1  --  14.1  --   --   --   HCT 43.6  --  40.5  --   --   --   PLT 260  --  234  --   --   --   APTT  --  24  --   --   --   --   LABPROT  --  12.9  --   --   --   --   INR  --  1.0  --   --   --   --   HEPARINUNFRC  --   --  0.26* 0.26* 0.52 0.80*  CREATININE 1.05  --   --   --   --   --   TROPONINIHS 221* 215*  --   --   --   --     Estimated Creatinine Clearance: 87.9 mL/min (by C-G formula based on SCr of 1.05 mg/dL).   Medical History: Past Medical History:  Diagnosis Date  . Diabetes mellitus without complication ([place])   . Hypertension   . Stroke [facility])     Medications:  Scheduled:  . aspirin  324 mg Oral NOW   Or  . aspirin  300 mg Rectal NOW  . aspirin EC  81 mg Oral Daily  . atenolol  50 mg Oral QHS  . calcium-vitamin D  1 tablet Oral Daily  . chlorthalidone  50 mg Oral Daily  . clopidogrel  75 mg Oral Daily  . folic acid  1 mg Oral Daily  . influenza vac  split quadrivalent PF  0.5 mL Intramuscular Tomorrow-1000  . insulin aspart  0-5 Units Subcutaneous QHS  . insulin aspart  0-9 Units Subcutaneous TID WC  . insulin glargine  35 Units Subcutaneous QHS  . losartan  100 mg Oral QPM  . multivitamin with minerals  1 tablet Oral Daily  . pantoprazole (PROTONIX) IV  40 mg Intravenous Q12H    Assessment: Patient arrives to ED w/ N/V and rash, was given zofran in ED. EKG's show some ST wave abnormalities, initial trop drawn was 221. No anticoagulation apparent. Patient is being started on heparin drip for presumed NSTEMI.  Goal of Therapy:  Heparin level 0.3-0.7 units/ml Monitor platelets by anticoagulation protocol: Yes   Plan:  12/20 @ 0030 HL 0.80 supratherapeutic. Per RN states patient had accidentally pulled out IV and bleed a decent amount. Will reduce rate to 1150 units/hr and will recheck HL 0800, Hgb trended down by a unit,  but stable will f/u w/ am labs.  Thomasene Ripple, [facility] Clinical Pharmacist 04/13/2019,1:44 AM

## 2019-04-13 NOTE — Plan of Care (Signed)
  Problem: Education: Goal: Knowledge of General Education information will improve Description: Including pain rating scale, medication(s)/side effects and non-pharmacologic comfort measures Outcome: Progressing   Problem: Clinical Measurements: Goal: Diagnostic test results will improve Outcome: Progressing Goal: Respiratory complications will improve Outcome: Progressing Goal: Cardiovascular complication will be avoided Outcome: Progressing   

## 2019-04-13 NOTE — Progress Notes (Signed)
[facility] Encounter Note  Patient: [name] [name] / Admit Date: 04/11/2019 / Date of Encounter: 04/13/2019, 2:02 PM   Subjective: No particular change of condition since yesterday.  No evidence of significant true anginal symptoms.  Patient's nausea and vomiting and other significant hospitalization a ER symptoms have resolved. Troponin level flat with no evidence of significant peak 221 possibly consistent with demand ischemia from nausea vomiting rather than acute coronary syndrome EKG not specifically changed with nonspecific T wave changes Echocardiogram showing normal LV systolic function with no evidence of valvular heart disease  Review of Systems: Positive for: Weakness Negative for: Vision change, hearing change, syncope, dizziness, nausea, vomiting,diarrhea, bloody stool, stomach pain, cough, congestion, diaphoresis, urinary frequency, urinary pain,skin lesions, skin rashes Others previously listed  Objective: Telemetry: Normal sinus rhythm Physical Exam: Blood pressure (!) 155/103, pulse 64, temperature (!) 97.4 F (36.3 C), temperature source Oral, resp. rate 18, height 5\' 9"  (1.753 m), weight 89.3 kg, SpO2 95 %. Body mass index is 29.06 kg/m. General: Well developed, well nourished, in no acute distress. Head: Normocephalic, atraumatic, sclera non-icteric, no xanthomas, nares are without discharge. Neck: No apparent masses Lungs: Normal respirations with no wheezes, no rhonchi, no rales , no crackles   Heart: Regular rate and rhythm, normal S1 S2, no murmur, no rub, no gallop, PMI is normal size and placement, carotid upstroke normal without bruit, jugular venous pressure normal Abdomen: Soft, non-tender, non-distended with normoactive bowel sounds. No hepatosplenomegaly. Abdominal aorta is normal size without bruit Extremities: No edema, no clubbing, no cyanosis, no ulcers,  Peripheral: 2+ radial, 2+ femoral, 2+ dorsal pedal pulses Neuro: Alert and  oriented. Moves all extremities spontaneously. Psych:  Responds to questions appropriately with a normal affect.   Intake/Output Summary (Last 24 hours) at 04/13/2019 1402 Last data filed at 04/13/2019 0900 Gross per 24 hour  Intake 2863.84 ml  Output 1400 ml  Net 1463.84 ml    Inpatient Medications:  . aspirin EC  81 mg Oral Daily  . atenolol  50 mg Oral QHS  . calcium-vitamin D  1 tablet Oral Daily  . chlorthalidone  50 mg Oral Daily  . clopidogrel  75 mg Oral Daily  . folic acid  1 mg Oral Daily  . influenza vac split quadrivalent PF  0.5 mL Intramuscular Tomorrow-1000  . insulin aspart  0-5 Units Subcutaneous QHS  . insulin aspart  0-9 Units Subcutaneous TID WC  . insulin glargine  35 Units Subcutaneous QHS  . losartan  100 mg Oral QPM  . multivitamin with minerals  1 tablet Oral Daily  . pantoprazole (PROTONIX) IV  40 mg Intravenous Q12H   Infusions:  . sodium chloride 75 mL/hr at 04/13/19 1300    Labs: Recent Labs    04/11/19 2231  NA 134*  K 4.1  CL 98  CO2 24  GLUCOSE 336*  BUN 16  CREATININE 1.05  CALCIUM 9.4   Recent Labs    04/12/19 0035  AST 17  ALT 35  ALKPHOS 79  BILITOT 0.8  PROT 6.9  ALBUMIN 3.4*   Recent Labs    04/12/19 0527 04/13/19 0843  WBC 11.1* 10.4  HGB 14.1 14.7  HCT 40.5 41.2  MCV 86.0 82.1  PLT 234 234   No results for input(s): CKTOTAL, CKMB, TROPONINI in the last 72 hours. Invalid input(s): POCBNP Recent Labs    04/12/19 0527  HGBA1C 12.5*     Weights: 04/14/19   04/11/19 2221 04/13/19 0545  Weight: 81.6 kg 89.3 kg     Radiology/Studies:  ECHOCARDIOGRAM COMPLETE  Result Date: 04/12/2019   ECHOCARDIOGRAM REPORT   Patient Name:   ZYMIR NAPOLI Date of Exam: 04/12/2019 Medical Rec #:  [id number]      Height:       69.0 in Accession #:    [id number]     Weight:       180.0 lb Date of Birth:  [birth year]       BSA:          1.98 m Patient Age:    48 years       BP:           133/89 mmHg Patient Gender: M               HR:           67 bpm. Exam Location:  [facility] Procedure: 2D Echo Indications:     Elevated Troponin  History:         Patient has prior history of Echocardiogram examinations, most                  recent 12/26/2016. Acute MI, Stroke; Risk Factors:Hypertension                  and Current Smoker.  Sonographer:     Avanell Shackleton Referring Phys:  [id number] [place] Diagnosing Phys: Skeet Latch MD IMPRESSIONS  1. Left ventricular ejection fraction, by visual estimation, is 50 to 55%. The left ventricle has normal function. There is no left ventricular hypertrophy.  2. The left ventricle has no regional wall motion abnormalities.  3. Global right ventricle has normal systolic function.The right ventricular size is normal. No increase in right ventricular wall thickness.  4. Left atrial size was normal.  5. Right atrial size was normal.  6. Trivial pericardial effusion is present.  7. The mitral valve is normal in structure. Trivial mitral valve regurgitation. No evidence of mitral stenosis.  8. The tricuspid valve is normal in structure. Tricuspid valve regurgitation is trivial.  9. The aortic valve is normal in structure. Aortic valve regurgitation is not visualized. No evidence of aortic valve sclerosis or stenosis. 10. The pulmonic valve was normal in structure. Pulmonic valve regurgitation is not visualized. 11. Normal pulmonary artery systolic pressure. 12. The inferior vena cava is normal in size with greater than 50% respiratory variability, suggesting right atrial pressure of 3 mmHg. FINDINGS  Left Ventricle: Left ventricular ejection fraction, by visual estimation, is 50 to 55%. The left ventricle has normal function. The left ventricle has no regional wall motion abnormalities. There is no left ventricular hypertrophy. Left ventricular diastolic parameters were normal. Normal left atrial pressure. Right Ventricle: The right ventricular size is normal. No increase in right ventricular wall thickness.  Global RV systolic function is has normal systolic function. The tricuspid regurgitant velocity is 2.02 m/s, and with an assumed right atrial pressure  of 3 mmHg, the estimated right ventricular systolic pressure is normal at 19.3 mmHg. Left Atrium: Left atrial size was normal in size. Right Atrium: Right atrial size was normal in size Pericardium: Trivial pericardial effusion is present. Mitral Valve: The mitral valve is normal in structure. Trivial mitral valve regurgitation. No evidence of mitral valve stenosis by observation. Tricuspid Valve: The tricuspid valve is normal in structure. Tricuspid valve regurgitation is trivial. Aortic Valve: The aortic valve is normal in structure. Aortic valve regurgitation is not visualized. The aortic valve  is structurally normal, with no evidence of sclerosis or stenosis. Pulmonic Valve: The pulmonic valve was normal in structure. Pulmonic valve regurgitation is not visualized. Pulmonic regurgitation is not visualized. Aorta: The aortic root, ascending aorta and aortic arch are all structurally normal, with no evidence of dilitation or obstruction. Venous: The inferior vena cava was not well visualized. The inferior vena cava is normal in size with greater than 50% respiratory variability, suggesting right atrial pressure of 3 mmHg. IAS/Shunts: No atrial level shunt detected by color flow Doppler. There is no evidence of a patent foramen ovale. No ventricular septal defect is seen or detected. There is no evidence of an atrial septal defect.  LEFT VENTRICLE PLAX 2D LVIDd:         4.66 cm  Diastology LVIDs:         3.47 cm  LV e' lateral: 10.80 cm/s LV PW:         1.09 cm  LV e' medial:  5.52 cm/s LV IVS:        0.94 cm LVOT diam:     1.70 cm LV SV:         51 ml LV SV Index:   25.16 LVOT Area:     2.27 cm  RIGHT VENTRICLE             IVC RV S prime:     11.70 cm/s  IVC diam: 1.54 cm TAPSE (M-mode): 1.9 cm LEFT ATRIUM           Index       RIGHT ATRIUM           Index LA  diam:      3.80 cm 1.92 cm/m  RA Area:     13.10 cm LA Vol (A4C): 29.4 ml 14.88 ml/m RA Volume:   28.20 ml  14.27 ml/m   AORTA Ao Root diam: 3.50 cm TRICUSPID VALVE TR Peak grad:   16.3 mmHg TR Vmax:        202.00 cm/s  SHUNTS Systemic Diam: 1.70 cm  Chilton Si MD Electronically signed by Chilton Si MD Signature Date/Time: 04/12/2019/2:28:59 PM    Final      Assessment and Recommendation  48 y.o. male with known diabetes hypertension hyperlipidemia peripheral vascular disease with significant nausea vomiting slowly improved and no current evidence of acute coronary syndrome with slight elevation of troponin of unknown etiology although appears to be from demand ischemia from nausea vomiting with no specific changes in EKG 1.  Okay for distant continuation of heparin and continue dual antiplatelet therapy 2.  Begin ambulation and follow for improvements of symptoms and other concerns 3.  Continue hypertension medication management and using atenolol losartan 4.  High intensity cholesterol therapy 5.  Further rehabilitation and follow for any significant symptoms but if ambulating well with no significant symptoms okay for discharge home with further outpatient evaluation from the cardiovascular standpoint  Signed, Arnoldo Hooker M.D. FACC

## 2019-04-13 NOTE — Discharge Summary (Signed)
Physician Discharge Summary  [name] [name] UJW:[id number] DOB: 06/03/1970 DOA: 04/11/2019  [facility]  Admit date: 04/11/2019 Discharge date: 04/13/2019  Admitted From: Home Disposition: Home  Recommendations for Outpatient Follow-up:  1. Follow up with PCP in 1-2 weeks 2. Follow-up with cardiology as directed  Home Health: No Equipment/Devices: No  Discharge Condition: Stable CODE STATUS: Full Diet recommendation: Heart Healthy / Carb Modified  Brief/Interim Summary: [name] [name]  is a 48 y.o. Caucasian male with a known history of diabetes mellitus, hypertension and CVA, who presented to the emergency room with onset of intractable nausea and vomiting 8:30 PM this evening.  He denied chest pain or palpitations.  No bilious vomitus or hematemesis.  He denied any abdominal pain.  He had any cough or wheezing or hemoptysis or palpitations.  No leg pain or edema recent travels or surgeries.  The patient was given IV Zofran by EMS and felt significantly better.  He also noted a rash in both lower extremities which is nonpruritic and has not known how long it has been there.  He has had similar rashes before and was diagnosed with vasculitis.  [facility] and states that he is not taking medications for it.  Upon presentation to the emergency room, blood pressure was 190/106 with otherwise normal vital signs.  Labs revealed mild hyponatremia 134 and hyperglycemia of 336.  Serum lipase was normal at 36.  CBC was within normal.  High-sensitivity Troponin I came back significantly elevated 221 and later 215.  She showed normal sinus rhythm with rate of 73 and later 64 with poor R progression.  The patient was given 4 baby aspirin 1 L of IV lactated Ringer 100 mg of p.o. Cozaar.  He was then given a bolus of IV heparin followed by IV heparin drip.  He will be admitted to telemetry bed for further evaluation and management.  12/19: Patient was seen in the  emergency department.  He is resting comfortably in bed.  Remains on heparin infusion.  He is chest pain-free.  Nausea and vomiting has improved substantially.  Patient has been seen by cardiology was recommended continued observation with medical management on heparin infusion.  Currently no plans for intervention.  Will advance diet.  Continue to monitor on telemetry overnight.  Will reevaluate in the morning.  Begin ambulate with assistance and assess.  12/20: Troponin is downtrending.  EKG is nonischemic appearing.  Cardiology evaluated patient.  Discontinue heparin drip at this time.  Medically stable for discharge home.  Discharge Diagnoses:  Active Problems:   Non-STEMI (non-ST elevated myocardial infarction) (HCC)  #Intractable nausea vomiting Patient responded to IV fluids and IV antiemetic regimen Tolerating p.o. diet on day of discharge Recommend bland diet Outpatient PCP follow-up Unclear etiology of nausea and vomiting  #Troponin elevation Unlikely to be acute coronary syndrome Likely demand ischemia Echocardiogram reassuring Troponins downtrending EKG nonischemic  #Hypertension Continue multimodal treatment approach as outpatient  #Hyperlipidemia High intensity statin therapy  #Tobacco use Counseled patient on smoking cessation  Discharge Instructions  Discharge Instructions    Diet - low sodium heart healthy   Complete by: As directed    Increase activity slowly   Complete by: As directed      Allergies as of 04/13/2019      Reactions   Penicillins Other (See Comments), Anaphylaxis   Has patient had a PCN reaction causing immediate rash, facial/tongue/throat swelling, SOB or lightheadedness with hypotension: Yes Has patient had  a PCN reaction causing severe rash involving mucus membranes or skin necrosis: No Has patient had a PCN reaction that required hospitalization: No Has patient had a PCN reaction occurring within the last 10 years: No If all of the  above answers are "NO", then may proceed with Cephalosporin use.      Medication List    STOP taking these medications   methocarbamol 500 MG tablet Commonly known as: ROBAXIN     TAKE these medications   aspirin 81 MG EC tablet Take 1 tablet (81 mg total) by mouth daily. Start taking on: April 14, 2019   atenolol 50 MG tablet Commonly known as: TENORMIN Take 50 mg by mouth at bedtime.   Calcium/Vitamin D 500-200 MG-UNIT Tabs Take 1 tablet by mouth daily.   chlorthalidone 50 MG tablet Commonly known as: HYGROTON Take 50 mg by mouth daily.   clopidogrel 75 MG tablet Commonly known as: PLAVIX Take 1 tablet (75 mg total) by mouth daily.   folic acid 1 MG tablet Commonly known as: FOLVITE Take 1 mg by mouth daily.   insulin glargine 100 UNIT/ML injection Commonly known as: LANTUS Inject 35 Units into the skin at bedtime.   liraglutide 18 MG/3ML Sopn Commonly known as: VICTOZA Inject into the skin daily.   losartan 100 MG tablet Commonly known as: COZAAR Take 100 mg by mouth every evening.   Multivitamin Adult Tabs Take 1 tablet by mouth daily.   Centrum Flavor Burst Adult Chew Chew 1 each by mouth daily.   NovoLOG 100 UNIT/ML injection Generic drug: insulin aspart Inject 0-10 Units into the skin 3 (three) times daily as needed for high blood sugar.   traMADol 50 MG tablet Commonly known as: ULTRAM Take 1 tablet (50 mg total) by mouth every 6 (six) hours as needed.      Follow-up Information    [facility] Follow up in 1 week(s).   Specialty: General Practice Contact information: [address] [place] [address] [phone]        Lamar Blinks, MD Follow up in 1 month(s).   Specialty: Cardiology Contact information: [address] [facility] [place] [address] [phone]          Allergies  Allergen Reactions  . Penicillins Other (See Comments) and Anaphylaxis    Has patient had a  PCN reaction causing immediate rash, facial/tongue/throat swelling, SOB or lightheadedness with hypotension: Yes Has patient had a PCN reaction causing severe rash involving mucus membranes or skin necrosis: No Has patient had a PCN reaction that required hospitalization: No Has patient had a PCN reaction occurring within the last 10 years: No If all of the above answers are "NO", then may proceed with Cephalosporin use.     Consultations:  [facility]     Subjective: Seen and examined on the day of discharge Chest pain-free Nausea and vomiting resolved Tolerating p.o. diet without issue Ambulating without issue Medically stable for discharge home  Discharge Exam: Vitals:   04/13/19 0545 04/13/19 0816  BP: (!) 141/91 (!) 155/103  Pulse: 64 64  Resp: 18 18  Temp: 97.7 F (36.5 C) (!) 97.4 F (36.3 C)  SpO2: 97% 95%   Vitals:   04/13/19 0056 04/13/19 0221 04/13/19 0545 04/13/19 0816  BP: (!) 151/100 130/77 (!) 141/91 (!) 155/103  Pulse: 62 64 64 64  Resp: 19  18 18   Temp:   97.7 F (36.5 C) (!) 97.4 F (36.3 C)  TempSrc:   Oral Oral  SpO2:   97% 95%  Weight:   89.3 kg   Height:        General: Pt is alert, awake, not in acute distress Cardiovascular: RRR, S1/S2 +, no rubs, no gallops Respiratory: CTA bilaterally, no wheezing, no rhonchi Abdominal: Soft, NT, ND, bowel sounds + Extremities: no edema, no cyanosis    The results of significant diagnostics from this hospitalization (including imaging, microbiology, ancillary and laboratory) are listed below for reference.     Microbiology: Recent Results (from the past 240 hour(s))  SARS CORONAVIRUS 2 (TAT 6-24 HRS) Nasopharyngeal Nasopharyngeal Swab     Status: None   Collection Time: 04/11/19 11:52 PM   Specimen: Nasopharyngeal Swab  Result Value Ref Range Status   SARS Coronavirus 2 NEGATIVE NEGATIVE Final    Comment: (NOTE) SARS-CoV-2 target nucleic acids are NOT DETECTED. The  SARS-CoV-2 RNA is generally detectable in upper and lower respiratory specimens during the acute phase of infection. Negative results do not preclude SARS-CoV-2 infection, do not rule out co-infections with other pathogens, and should not be used as the sole basis for treatment or other patient management decisions. Negative results must be combined with clinical observations, patient history, and epidemiological information. The expected result is Negative. Fact Sheet for Patients: SugarRoll.be Fact Sheet for Healthcare Providers: https://www.woods-mathews.com/ This test is not yet approved or cleared by the [place] FDA and  has been authorized for detection and/or diagnosis of SARS-CoV-2 by FDA under an Emergency Use Authorization (EUA). This EUA will remain  in effect (meaning this test can be used) for the duration of the COVID-19 declaration under Section 56 4(b)(1) of the Act, 21 U.S.C. section 360bbb-3(b)(1), unless the authorization is terminated or revoked sooner. Performed at [facility], [place] [address] [place], [place] [address]      Labs: BNP (last 3 results) No results for input(s): BNP in the last 8760 hours. Basic Metabolic Panel: Recent Labs  Lab 04/11/19 2231  NA 134*  K 4.1  CL 98  CO2 24  GLUCOSE 336*  BUN 16  CREATININE 1.05  CALCIUM 9.4   Liver Function Tests: Recent Labs  Lab 04/12/19 0035  AST 17  ALT 35  ALKPHOS 79  BILITOT 0.8  PROT 6.9  ALBUMIN 3.4*   Recent Labs  Lab 04/12/19 0035  LIPASE 36   No results for input(s): AMMONIA in the last 168 hours. CBC: Recent Labs  Lab 04/11/19 2231 04/12/19 0527 04/13/19 0843  WBC 9.9 11.1* 10.4  HGB 15.1 14.1 14.7  HCT 43.6 40.5 41.2  MCV 85.3 86.0 82.1  PLT 260 234 234   Cardiac Enzymes: No results for input(s): CKTOTAL, CKMB, CKMBINDEX, TROPONINI in the last 168 hours. BNP: Invalid input(s): POCBNP CBG: Recent Labs  Lab  04/12/19 1109 04/12/19 1701 04/12/19 2027 04/13/19 0817 04/13/19 1229  GLUCAP 343* 233* 266* 203* 282*   D-Dimer No results for input(s): DDIMER in the last 72 hours. Hgb A1c Recent Labs    04/12/19 0527  HGBA1C 12.5*   Lipid Profile No results for input(s): CHOL, HDL, LDLCALC, TRIG, CHOLHDL, LDLDIRECT in the last 72 hours. Thyroid function studies No results for input(s): TSH, T4TOTAL, T3FREE, THYROIDAB in the last 72 hours.  Invalid input(s): FREET3 Anemia work up No results for input(s): VITAMINB12, FOLATE, FERRITIN, TIBC, IRON, RETICCTPCT in the last 72 hours. Urinalysis    Component Value Date/Time   COLORURINE YELLOW 12/25/2016 1949   APPEARANCEUR CLEAR 12/25/2016 1949  LABSPEC 1.026 12/25/2016 1949   PHURINE 6.0 12/25/2016 1949   GLUCOSEU >=500 (A) 12/25/2016 1949   HGBUR NEGATIVE 12/25/2016 1949   BILIRUBINUR NEGATIVE 12/25/2016 1949   KETONESUR NEGATIVE 12/25/2016 1949   PROTEINUR >=300 (A) 12/25/2016 1949   NITRITE NEGATIVE 12/25/2016 1949   LEUKOCYTESUR NEGATIVE 12/25/2016 1949   Sepsis Labs Invalid input(s): PROCALCITONIN,  WBC,  LACTICIDVEN Microbiology Recent Results (from the past 240 hour(s))  SARS CORONAVIRUS 2 (TAT 6-24 HRS) Nasopharyngeal Nasopharyngeal Swab     Status: None   Collection Time: 04/11/19 11:52 PM   Specimen: Nasopharyngeal Swab  Result Value Ref Range Status   SARS Coronavirus 2 NEGATIVE NEGATIVE Final    Comment: (NOTE) SARS-CoV-2 target nucleic acids are NOT DETECTED. The SARS-CoV-2 RNA is generally detectable in upper and lower respiratory specimens during the acute phase of infection. Negative results do not preclude SARS-CoV-2 infection, do not rule out co-infections with other pathogens, and should not be used as the sole basis for treatment or other patient management decisions. Negative results must be combined with clinical observations, patient history, and epidemiological information. The expected result is  Negative. Fact Sheet for Patients: HairSlick.no Fact Sheet for Healthcare Providers: quierodirigir.com This test is not yet approved or cleared by the [place] FDA and  has been authorized for detection and/or diagnosis of SARS-CoV-2 by FDA under an Emergency Use Authorization (EUA). This EUA will remain  in effect (meaning this test can be used) for the duration of the COVID-19 declaration under Section 56 4(b)(1) of the Act, 21 U.S.C. section 360bbb-3(b)(1), unless the authorization is terminated or revoked sooner. Performed at [facility], [address], [place], [place] [address]      Time coordinating discharge: Over 30 minutes  SIGNED:   Tresa Moore, MD  [facility] 04/13/2019, 3:06 PM Pager [phone]  If 7PM-7AM, please contact night-coverage [email or web address] Password [email or web address]

## 2020-01-22 ENCOUNTER — Other Ambulatory Visit: Payer: Self-pay

## 2020-01-22 DIAGNOSIS — I10 Essential (primary) hypertension: Secondary | ICD-10-CM | POA: Diagnosis present

## 2020-01-22 DIAGNOSIS — Z993 Dependence on wheelchair: Secondary | ICD-10-CM

## 2020-01-22 DIAGNOSIS — I2102 ST elevation (STEMI) myocardial infarction involving left anterior descending coronary artery: Secondary | ICD-10-CM | POA: Diagnosis not present

## 2020-01-22 DIAGNOSIS — F17210 Nicotine dependence, cigarettes, uncomplicated: Secondary | ICD-10-CM | POA: Diagnosis present

## 2020-01-22 DIAGNOSIS — F418 Other specified anxiety disorders: Secondary | ICD-10-CM | POA: Diagnosis present

## 2020-01-22 DIAGNOSIS — Z8249 Family history of ischemic heart disease and other diseases of the circulatory system: Secondary | ICD-10-CM

## 2020-01-22 DIAGNOSIS — Z7902 Long term (current) use of antithrombotics/antiplatelets: Secondary | ICD-10-CM

## 2020-01-22 DIAGNOSIS — Z880 Allergy status to penicillin: Secondary | ICD-10-CM

## 2020-01-22 DIAGNOSIS — Z833 Family history of diabetes mellitus: Secondary | ICD-10-CM

## 2020-01-22 DIAGNOSIS — Z20822 Contact with and (suspected) exposure to covid-19: Secondary | ICD-10-CM | POA: Diagnosis present

## 2020-01-22 DIAGNOSIS — E1165 Type 2 diabetes mellitus with hyperglycemia: Secondary | ICD-10-CM | POA: Diagnosis present

## 2020-01-22 DIAGNOSIS — Z79899 Other long term (current) drug therapy: Secondary | ICD-10-CM

## 2020-01-22 DIAGNOSIS — E785 Hyperlipidemia, unspecified: Secondary | ICD-10-CM | POA: Diagnosis present

## 2020-01-22 DIAGNOSIS — Z794 Long term (current) use of insulin: Secondary | ICD-10-CM

## 2020-01-22 DIAGNOSIS — I213 ST elevation (STEMI) myocardial infarction of unspecified site: Secondary | ICD-10-CM | POA: Diagnosis not present

## 2020-01-22 DIAGNOSIS — I69953 Hemiplegia and hemiparesis following unspecified cerebrovascular disease affecting right non-dominant side: Secondary | ICD-10-CM

## 2020-01-22 DIAGNOSIS — I495 Sick sinus syndrome: Secondary | ICD-10-CM | POA: Diagnosis present

## 2020-01-22 DIAGNOSIS — E1149 Type 2 diabetes mellitus with other diabetic neurological complication: Secondary | ICD-10-CM | POA: Diagnosis present

## 2020-01-22 DIAGNOSIS — Z823 Family history of stroke: Secondary | ICD-10-CM

## 2020-01-22 DIAGNOSIS — Z950 Presence of cardiac pacemaker: Secondary | ICD-10-CM

## 2020-01-22 DIAGNOSIS — I513 Intracardiac thrombosis, not elsewhere classified: Secondary | ICD-10-CM | POA: Diagnosis present

## 2020-01-22 DIAGNOSIS — I4891 Unspecified atrial fibrillation: Secondary | ICD-10-CM | POA: Diagnosis present

## 2020-01-22 LAB — COMPREHENSIVE METABOLIC PANEL
ALT: 33 U/L (ref 0–44)
AST: 36 U/L (ref 15–41)
Albumin: 3.5 g/dL (ref 3.5–5.0)
Alkaline Phosphatase: 71 U/L (ref 38–126)
Anion gap: 14 (ref 5–15)
BUN: 11 mg/dL (ref 6–20)
CO2: 24 mmol/L (ref 22–32)
Calcium: 9.5 mg/dL (ref 8.9–10.3)
Chloride: 101 mmol/L (ref 98–111)
Creatinine, Ser: 0.92 mg/dL (ref 0.61–1.24)
GFR calc Af Amer: >60 mL/min (ref >60)
GFR calc non Af Amer: >60 mL/min (ref >60)
Glucose, Bld: 134 mg/dL — ABNORMAL HIGH (ref 70–99)
Potassium: 4.2 mmol/L (ref 3.5–5.1)
Sodium: 139 mmol/L (ref 135–145)
Total Bilirubin: 1.5 mg/dL — ABNORMAL HIGH (ref 0.3–1.2)
Total Protein: 7.8 g/dL (ref 6.5–8.1)

## 2020-01-22 LAB — RESPIRATORY PANEL BY RT PCR (FLU A&B, COVID)
Influenza A by PCR: NEGATIVE
Influenza B by PCR: NEGATIVE
SARS Coronavirus 2 by RT PCR: NEGATIVE

## 2020-01-22 LAB — CBC
HCT: 40.8 % (ref 39.0–52.0)
Hemoglobin: 14.9 g/dL (ref 13.0–17.0)
MCH: 31.1 pg (ref 26.0–34.0)
MCHC: 36.5 g/dL — ABNORMAL HIGH (ref 30.0–36.0)
MCV: 85.2 fL (ref 80.0–100.0)
Platelets: 257 10*3/uL (ref 150–400)
RBC: 4.79 MIL/uL (ref 4.22–5.81)
RDW: 11.5 % (ref 11.5–15.5)
WBC: 10.8 10*3/uL — ABNORMAL HIGH (ref 4.0–10.5)
nRBC: 0 % (ref 0.0–0.2)

## 2020-01-22 NOTE — ED Triage Notes (Signed)
Pt brought in by EMS since Sunday no co vomiting. States has had intermittent episodes of vomiting for months, has seen pmd for the same. Denies any pain at this time, has apptm tomorrow with pmd. Denies any diarrhea states felt like he might have had a fever but did not check it .

## 2020-01-23 ENCOUNTER — Encounter: Payer: Self-pay | Admitting: Internal Medicine

## 2020-01-23 ENCOUNTER — Emergency Department: Payer: No Typology Code available for payment source

## 2020-01-23 ENCOUNTER — Inpatient Hospital Stay
Admission: EM | Admit: 2020-01-23 | Discharge: 2020-01-25 | DRG: 251 | Disposition: A | Discharge status: Home / Self Care | Payer: No Typology Code available for payment source | Attending: Hospitalist | Admitting: Hospitalist

## 2020-01-23 ENCOUNTER — Encounter
Admission: EM | Disposition: A | Discharge status: Home / Self Care | Payer: Self-pay | Source: Home / Self Care | Attending: Hospitalist

## 2020-01-23 DIAGNOSIS — Z880 Allergy status to penicillin: Secondary | ICD-10-CM | POA: Diagnosis not present

## 2020-01-23 DIAGNOSIS — F418 Other specified anxiety disorders: Secondary | ICD-10-CM | POA: Diagnosis present

## 2020-01-23 DIAGNOSIS — I513 Intracardiac thrombosis, not elsewhere classified: Secondary | ICD-10-CM | POA: Diagnosis present

## 2020-01-23 DIAGNOSIS — I69953 Hemiplegia and hemiparesis following unspecified cerebrovascular disease affecting right non-dominant side: Secondary | ICD-10-CM | POA: Diagnosis not present

## 2020-01-23 DIAGNOSIS — I213 ST elevation (STEMI) myocardial infarction of unspecified site: Secondary | ICD-10-CM | POA: Diagnosis present

## 2020-01-23 DIAGNOSIS — Z833 Family history of diabetes mellitus: Secondary | ICD-10-CM | POA: Diagnosis not present

## 2020-01-23 DIAGNOSIS — Z993 Dependence on wheelchair: Secondary | ICD-10-CM | POA: Diagnosis not present

## 2020-01-23 DIAGNOSIS — IMO0002 Reserved for concepts with insufficient information to code with codable children: Secondary | ICD-10-CM | POA: Diagnosis present

## 2020-01-23 DIAGNOSIS — E785 Hyperlipidemia, unspecified: Secondary | ICD-10-CM | POA: Diagnosis present

## 2020-01-23 DIAGNOSIS — Z79899 Other long term (current) drug therapy: Secondary | ICD-10-CM | POA: Diagnosis not present

## 2020-01-23 DIAGNOSIS — E1149 Type 2 diabetes mellitus with other diabetic neurological complication: Secondary | ICD-10-CM | POA: Diagnosis present

## 2020-01-23 DIAGNOSIS — F17210 Nicotine dependence, cigarettes, uncomplicated: Secondary | ICD-10-CM | POA: Diagnosis present

## 2020-01-23 DIAGNOSIS — Z955 Presence of coronary angioplasty implant and graft: Secondary | ICD-10-CM | POA: Diagnosis not present

## 2020-01-23 DIAGNOSIS — I495 Sick sinus syndrome: Secondary | ICD-10-CM | POA: Diagnosis present

## 2020-01-23 DIAGNOSIS — I2102 ST elevation (STEMI) myocardial infarction involving left anterior descending coronary artery: Secondary | ICD-10-CM | POA: Diagnosis present

## 2020-01-23 DIAGNOSIS — E1165 Type 2 diabetes mellitus with hyperglycemia: Secondary | ICD-10-CM | POA: Diagnosis present

## 2020-01-23 DIAGNOSIS — Z20822 Contact with and (suspected) exposure to covid-19: Secondary | ICD-10-CM | POA: Diagnosis present

## 2020-01-23 DIAGNOSIS — Z7902 Long term (current) use of antithrombotics/antiplatelets: Secondary | ICD-10-CM | POA: Diagnosis not present

## 2020-01-23 DIAGNOSIS — Z794 Long term (current) use of insulin: Secondary | ICD-10-CM | POA: Diagnosis not present

## 2020-01-23 DIAGNOSIS — I4891 Unspecified atrial fibrillation: Secondary | ICD-10-CM | POA: Diagnosis present

## 2020-01-23 DIAGNOSIS — I10 Essential (primary) hypertension: Secondary | ICD-10-CM | POA: Diagnosis present

## 2020-01-23 DIAGNOSIS — E1169 Type 2 diabetes mellitus with other specified complication: Secondary | ICD-10-CM | POA: Diagnosis present

## 2020-01-23 DIAGNOSIS — Z8249 Family history of ischemic heart disease and other diseases of the circulatory system: Secondary | ICD-10-CM | POA: Diagnosis not present

## 2020-01-23 DIAGNOSIS — Z823 Family history of stroke: Secondary | ICD-10-CM | POA: Diagnosis not present

## 2020-01-23 DIAGNOSIS — Z950 Presence of cardiac pacemaker: Secondary | ICD-10-CM | POA: Diagnosis not present

## 2020-01-23 HISTORY — PX: CORONARY/GRAFT ACUTE MI REVASCULARIZATION: CATH118305

## 2020-01-23 HISTORY — PX: LEFT HEART CATH AND CORONARY ANGIOGRAPHY: CATH118249

## 2020-01-23 LAB — CBC WITH DIFFERENTIAL/PLATELET
Abs Immature Granulocytes: 0.05 10*3/uL (ref 0.00–0.07)
Abs Immature Granulocytes: 0.02 10*3/uL (ref 0.00–0.07)
Basophils Absolute: 0.1 10*3/uL (ref 0.0–0.1)
Basophils Absolute: 0 10*3/uL (ref 0.0–0.1)
Basophils Relative: 0 %
Basophils Relative: 1 %
Eosinophils Absolute: 0 10*3/uL (ref 0.0–0.5)
Eosinophils Absolute: 0 10*3/uL (ref 0.0–0.5)
Eosinophils Relative: 0 %
Eosinophils Relative: 0 %
HCT: 44.2 % (ref 39.0–52.0)
HCT: 37.6 % — ABNORMAL LOW (ref 39.0–52.0)
Hemoglobin: 15.9 g/dL (ref 13.0–17.0)
Hemoglobin: 12.8 g/dL — ABNORMAL LOW (ref 13.0–17.0)
Immature Granulocytes: 0 %
Immature Granulocytes: 0 %
Lymphocytes Relative: 20 %
Lymphocytes Relative: 29 %
Lymphs Abs: 2.5 10*3/uL (ref 0.7–4.0)
Lymphs Abs: 2.3 10*3/uL (ref 0.7–4.0)
MCH: 31.1 pg (ref 26.0–34.0)
MCH: 30.3 pg (ref 26.0–34.0)
MCHC: 36 g/dL (ref 30.0–36.0)
MCHC: 34 g/dL (ref 30.0–36.0)
MCV: 86.5 fL (ref 80.0–100.0)
MCV: 89.1 fL (ref 80.0–100.0)
Monocytes Absolute: 0.9 10*3/uL (ref 0.1–1.0)
Monocytes Absolute: 0.6 10*3/uL (ref 0.1–1.0)
Monocytes Relative: 7 %
Monocytes Relative: 7 %
Neutro Abs: 8.9 10*3/uL — ABNORMAL HIGH (ref 1.7–7.7)
Neutro Abs: 4.9 10*3/uL (ref 1.7–7.7)
Neutrophils Relative %: 73 %
Neutrophils Relative %: 63 %
Platelets: 268 10*3/uL (ref 150–400)
Platelets: 243 10*3/uL (ref 150–400)
RBC: 5.11 MIL/uL (ref 4.22–5.81)
RBC: 4.22 MIL/uL (ref 4.22–5.81)
RDW: 11.9 % (ref 11.5–15.5)
RDW: 11.7 % (ref 11.5–15.5)
WBC: 12.5 10*3/uL — ABNORMAL HIGH (ref 4.0–10.5)
WBC: 7.8 10*3/uL (ref 4.0–10.5)
nRBC: 0 % (ref 0.0–0.2)
nRBC: 0 % (ref 0.0–0.2)

## 2020-01-23 LAB — APTT: aPTT: 28 seconds (ref 24–36)

## 2020-01-23 LAB — LIPID PANEL
Cholesterol: 202 mg/dL — ABNORMAL HIGH (ref 0–200)
HDL: 48 mg/dL (ref >40)
LDL Cholesterol: 126 mg/dL — ABNORMAL HIGH (ref 0–99)
Total CHOL/HDL Ratio: 4.2 RATIO
Triglycerides: 142 mg/dL (ref <150)
VLDL: 28 mg/dL (ref 0–40)

## 2020-01-23 LAB — GLUCOSE, CAPILLARY
Glucose-Capillary: 283 mg/dL — ABNORMAL HIGH (ref 70–99)
Glucose-Capillary: 279 mg/dL — ABNORMAL HIGH (ref 70–99)
Glucose-Capillary: 204 mg/dL — ABNORMAL HIGH (ref 70–99)
Glucose-Capillary: 309 mg/dL — ABNORMAL HIGH (ref 70–99)
Glucose-Capillary: 268 mg/dL — ABNORMAL HIGH (ref 70–99)

## 2020-01-23 LAB — TROPONIN I (HIGH SENSITIVITY)
Troponin I (High Sensitivity): 4125 ng/L (ref <18)
Troponin I (High Sensitivity): 3215 ng/L (ref <18)
Troponin I (High Sensitivity): 3365 ng/L (ref <18)

## 2020-01-23 LAB — POCT ACTIVATED CLOTTING TIME: Activated Clotting Time: 324 seconds

## 2020-01-23 LAB — HEMOGLOBIN A1C
Hgb A1c MFr Bld: 10 % — ABNORMAL HIGH (ref 4.8–5.6)
Mean Plasma Glucose: 240.3 mg/dL

## 2020-01-23 LAB — PROTIME-INR
INR: 1 (ref 0.8–1.2)
Prothrombin Time: 12.5 seconds (ref 11.4–15.2)

## 2020-01-23 LAB — CREATININE, SERUM
Creatinine, Ser: 0.94 mg/dL (ref 0.61–1.24)
GFR calc Af Amer: >60 mL/min (ref >60)
GFR calc non Af Amer: >60 mL/min (ref >60)

## 2020-01-23 SURGERY — CORONARY/GRAFT ACUTE MI REVASCULARIZATION
Anesthesia: Moderate Sedation

## 2020-01-23 MED ORDER — LABETALOL HCL 5 MG/ML IV SOLN
10.0000 mg | INTRAVENOUS | Status: AC | PRN
  Administered 2020-01-23: 10 mg via INTRAVENOUS

## 2020-01-23 MED ORDER — SODIUM CHLORIDE 0.9 % IV SOLN: 250.0000 mL | INTRAVENOUS | Status: DC | PRN

## 2020-01-23 MED ORDER — ATENOLOL 50 MG PO TABS
50.0000 mg | ORAL_TABLET | Freq: Every day | ORAL | Status: DC
  Administered 2020-01-23: 50 mg via ORAL
  Filled 2020-01-23 (×2): qty 1

## 2020-01-23 MED ORDER — ACETAMINOPHEN 325 MG PO TABS: 650.0000 mg | ORAL_TABLET | ORAL | Status: DC | PRN

## 2020-01-23 MED ORDER — NITROGLYCERIN 0.4 MG SL SUBL: 0.4000 mg | SUBLINGUAL_TABLET | SUBLINGUAL | Status: DC | PRN

## 2020-01-23 MED ORDER — SODIUM CHLORIDE 0.9 % IV SOLN: INTRAVENOUS | Status: DC

## 2020-01-23 MED ORDER — NITROGLYCERIN 1 MG/10 ML FOR IR/CATH LAB
INTRA_ARTERIAL | Status: DC | PRN
  Administered 2020-01-23: 200 ug

## 2020-01-23 MED ORDER — INSULIN GLARGINE 100 UNIT/ML ~~LOC~~ SOLN
35.0000 [IU] | Freq: Every day | SUBCUTANEOUS | Status: DC
  Administered 2020-01-23 – 2020-01-24 (×2): 35 [IU] via SUBCUTANEOUS
  Filled 2020-01-23 (×3): qty 0.35

## 2020-01-23 MED ORDER — SODIUM CHLORIDE 0.9 % IV SOLN
INTRAVENOUS | Status: DC | PRN
  Administered 2020-01-23: 1.75 mg/kg/h via INTRAVENOUS

## 2020-01-23 MED ORDER — SODIUM CHLORIDE 0.9% FLUSH
3.0000 mL | Freq: Two times a day (BID) | INTRAVENOUS | Status: DC
  Administered 2020-01-24: 3 mL via INTRAVENOUS

## 2020-01-23 MED ORDER — ONDANSETRON HCL 4 MG/2ML IJ SOLN
4.0000 mg | Freq: Once | INTRAMUSCULAR | Status: AC
  Administered 2020-01-23: 4 mg via INTRAVENOUS
  Filled 2020-01-23: qty 2

## 2020-01-23 MED ORDER — ONDANSETRON HCL 4 MG/2ML IJ SOLN: 4.0000 mg | Freq: Four times a day (QID) | INTRAMUSCULAR | Status: DC | PRN

## 2020-01-23 MED ORDER — NITROGLYCERIN 1 MG/10 ML FOR IR/CATH LAB
INTRA_ARTERIAL | Status: AC
  Filled 2020-01-23: qty 10

## 2020-01-23 MED ORDER — MIDAZOLAM HCL 2 MG/2ML IJ SOLN
INTRAMUSCULAR | Status: DC | PRN
  Administered 2020-01-23: 1 mg via INTRAVENOUS

## 2020-01-23 MED ORDER — TIROFIBAN HCL IV 12.5 MG/250 ML
INTRAVENOUS | Status: AC | PRN
  Administered 2020-01-23: 0.15 ug/kg/min via INTRAVENOUS

## 2020-01-23 MED ORDER — INSULIN ASPART 100 UNIT/ML ~~LOC~~ SOLN
0.0000 [IU] | Freq: Three times a day (TID) | SUBCUTANEOUS | Status: DC
  Administered 2020-01-23: 11 [IU] via SUBCUTANEOUS
  Administered 2020-01-24: 3 [IU] via SUBCUTANEOUS
  Administered 2020-01-24 – 2020-01-25 (×2): 8 [IU] via SUBCUTANEOUS
  Administered 2020-01-25: 5 [IU] via SUBCUTANEOUS

## 2020-01-23 MED ORDER — FOLIC ACID 1 MG PO TABS
1.0000 mg | ORAL_TABLET | Freq: Every day | ORAL | Status: DC
  Administered 2020-01-23 – 2020-01-24 (×2): 1 mg via ORAL
  Filled 2020-01-23 (×4): qty 1

## 2020-01-23 MED ORDER — TIROFIBAN HCL IV 12.5 MG/250 ML
0.1500 ug/kg/min | INTRAVENOUS | Status: AC
  Administered 2020-01-23 – 2020-01-24 (×2): 0.15 ug/kg/min via INTRAVENOUS
  Filled 2020-01-23 (×3): qty 100
  Filled 2020-01-23: qty 250

## 2020-01-23 MED ORDER — LABETALOL HCL 5 MG/ML IV SOLN
INTRAVENOUS | Status: AC
  Filled 2020-01-23: qty 4

## 2020-01-23 MED ORDER — ENOXAPARIN SODIUM 40 MG/0.4ML ~~LOC~~ SOLN: 40.0000 mg | SUBCUTANEOUS | Status: DC

## 2020-01-23 MED ORDER — ENOXAPARIN SODIUM 40 MG/0.4ML ~~LOC~~ SOLN
SUBCUTANEOUS | Status: AC
  Administered 2020-01-23: 40 mg via SUBCUTANEOUS
  Filled 2020-01-23: qty 0.4

## 2020-01-23 MED ORDER — ASPIRIN 81 MG PO CHEW
324.0000 mg | CHEWABLE_TABLET | Freq: Once | ORAL | Status: AC
  Administered 2020-01-23: 324 mg via ORAL
  Filled 2020-01-23: qty 4

## 2020-01-23 MED ORDER — TRIAMTERENE-HCTZ 37.5-25 MG PO CAPS
1.0000 | ORAL_CAPSULE | Freq: Every day | ORAL | Status: DC
  Administered 2020-01-24 – 2020-01-25 (×2): 1 via ORAL
  Filled 2020-01-23 (×3): qty 1

## 2020-01-23 MED ORDER — ATORVASTATIN CALCIUM 80 MG PO TABS
80.0000 mg | ORAL_TABLET | Freq: Every day | ORAL | Status: DC
  Administered 2020-01-23: 80 mg via ORAL
  Filled 2020-01-23 (×4): qty 1

## 2020-01-23 MED ORDER — CLOPIDOGREL BISULFATE 75 MG PO TABS
ORAL_TABLET | ORAL | Status: DC | PRN
  Administered 2020-01-23: 300 mg via ORAL

## 2020-01-23 MED ORDER — INSULIN ASPART 100 UNIT/ML ~~LOC~~ SOLN
0.0000 [IU] | Freq: Every day | SUBCUTANEOUS | Status: DC
  Administered 2020-01-23: 3 [IU] via SUBCUTANEOUS

## 2020-01-23 MED ORDER — BIVALIRUDIN BOLUS VIA INFUSION - CUPID
INTRAVENOUS | Status: DC | PRN
  Administered 2020-01-23: 62.925 mg via INTRAVENOUS

## 2020-01-23 MED ORDER — CLOPIDOGREL BISULFATE 300 MG PO TABS
ORAL_TABLET | ORAL | Status: AC
  Filled 2020-01-23: qty 1

## 2020-01-23 MED ORDER — CITALOPRAM HYDROBROMIDE 20 MG PO TABS
40.0000 mg | ORAL_TABLET | Freq: Every day | ORAL | Status: DC
  Administered 2020-01-23 – 2020-01-25 (×3): 40 mg via ORAL
  Filled 2020-01-23 (×5): qty 2

## 2020-01-23 MED ORDER — BIVALIRUDIN TRIFLUOROACETATE 250 MG IV SOLR
INTRAVENOUS | Status: AC
  Filled 2020-01-23: qty 250

## 2020-01-23 MED ORDER — SODIUM CHLORIDE 0.9% FLUSH: 3.0000 mL | INTRAVENOUS | Status: DC | PRN

## 2020-01-23 MED ORDER — HEPARIN SODIUM (PORCINE) 5000 UNIT/ML IJ SOLN
4000.0000 [IU] | Freq: Once | INTRAMUSCULAR | Status: DC
  Administered 2020-01-23: 4000 [IU] via INTRAVENOUS
  Filled 2020-01-23: qty 1

## 2020-01-23 MED ORDER — CLOPIDOGREL BISULFATE 75 MG PO TABS
75.0000 mg | ORAL_TABLET | Freq: Every day | ORAL | Status: DC
  Administered 2020-01-24 – 2020-01-25 (×2): 75 mg via ORAL
  Filled 2020-01-23 (×4): qty 1

## 2020-01-23 MED ORDER — FENTANYL CITRATE (PF) 100 MCG/2ML IJ SOLN
INTRAMUSCULAR | Status: DC | PRN
Start: 2020-01-23 — End: 2020-01-23
  Administered 2020-01-23: 50 ug via INTRAVENOUS

## 2020-01-23 MED ORDER — INSULIN ASPART 100 UNIT/ML ~~LOC~~ SOLN
SUBCUTANEOUS | Status: AC
  Filled 2020-01-23: qty 1

## 2020-01-23 MED ORDER — LOSARTAN POTASSIUM 50 MG PO TABS
100.0000 mg | ORAL_TABLET | Freq: Every evening | ORAL | Status: DC
  Administered 2020-01-23 – 2020-01-24 (×2): 100 mg via ORAL
  Filled 2020-01-23 (×3): qty 2

## 2020-01-23 MED ORDER — HEPARIN (PORCINE) 25000 UT/250ML-% IV SOLN: 1100.0000 [IU]/h | INTRAVENOUS | Status: DC

## 2020-01-23 MED ORDER — TIROFIBAN HCL IV 12.5 MG/250 ML
INTRAVENOUS | Status: AC
  Filled 2020-01-23: qty 250

## 2020-01-23 MED ORDER — HEPARIN (PORCINE) IN NACL 1000-0.9 UT/500ML-% IV SOLN
INTRAVENOUS | Status: AC
  Filled 2020-01-23: qty 1000

## 2020-01-23 MED ORDER — IOHEXOL 300 MG/ML  SOLN
INTRAMUSCULAR | Status: DC | PRN
  Administered 2020-01-23: 200 mL

## 2020-01-23 MED ORDER — ASPIRIN 81 MG PO CHEW
81.0000 mg | CHEWABLE_TABLET | Freq: Every day | ORAL | Status: DC
  Administered 2020-01-24: 81 mg via ORAL
  Filled 2020-01-23 (×2): qty 1

## 2020-01-23 MED ORDER — LABETALOL HCL 5 MG/ML IV SOLN
INTRAVENOUS | Status: AC
  Administered 2020-01-23: 10 mg via INTRAVENOUS
  Filled 2020-01-23: qty 4

## 2020-01-23 MED ORDER — TAMSULOSIN HCL 0.4 MG PO CAPS
0.4000 mg | ORAL_CAPSULE | Freq: Every day | ORAL | Status: DC
  Filled 2020-01-23 (×3): qty 1

## 2020-01-23 MED ORDER — HEPARIN (PORCINE) IN NACL 1000-0.9 UT/500ML-% IV SOLN
INTRAVENOUS | Status: DC | PRN
  Administered 2020-01-23: 500 mL

## 2020-01-23 MED ORDER — MIDAZOLAM HCL 2 MG/2ML IJ SOLN
INTRAMUSCULAR | Status: AC
  Filled 2020-01-23: qty 2

## 2020-01-23 MED ORDER — FENTANYL CITRATE (PF) 100 MCG/2ML IJ SOLN
INTRAMUSCULAR | Status: AC
  Filled 2020-01-23: qty 2

## 2020-01-23 MED ORDER — LABETALOL HCL 5 MG/ML IV SOLN
INTRAVENOUS | Status: DC | PRN
  Administered 2020-01-23: 10 mg via INTRAVENOUS

## 2020-01-23 MED ORDER — HYDRALAZINE HCL 20 MG/ML IJ SOLN: 10.0000 mg | INTRAMUSCULAR | Status: AC | PRN

## 2020-01-23 MED ORDER — SODIUM CHLORIDE FLUSH 0.9 % IV SOLN
INTRAVENOUS | Status: AC
  Administered 2020-01-23: 3 mL via INTRAVENOUS
  Filled 2020-01-23: qty 10

## 2020-01-23 MED ORDER — SODIUM CHLORIDE 0.9 % WEIGHT BASED INFUSION: 1.0000 mL/kg/h | INTRAVENOUS | Status: DC

## 2020-01-23 MED ORDER — BUPROPION HCL ER (XL) 150 MG PO TB24
150.0000 mg | ORAL_TABLET | Freq: Every day | ORAL | Status: DC
  Administered 2020-01-23 – 2020-01-24 (×2): 150 mg via ORAL
  Filled 2020-01-23 (×3): qty 1

## 2020-01-23 MED ORDER — HEPARIN BOLUS VIA INFUSION
4000.0000 [IU] | Freq: Once | INTRAVENOUS | Status: DC
  Filled 2020-01-23: qty 4000

## 2020-01-23 SURGICAL SUPPLY — 12 items
BALLN TREK RX 2.25X15 (BALLOONS) ×3
BALLOON TREK RX 2.25X15 (BALLOONS) ×1 IMPLANT
CATH INFINITI 5FR JL4 (CATHETERS) ×3 IMPLANT
CATH INFINITI JR4 5F (CATHETERS) ×3 IMPLANT
CATH VISTA GUIDE 6FR XB3.5 (CATHETERS) ×3 IMPLANT
KIT ENCORE 26 ADVANTAGE (KITS) ×3 IMPLANT
KIT MANI 3VAL PERCEP (MISCELLANEOUS) ×3 IMPLANT
NEEDLE PERC 18GX7CM (NEEDLE) ×3 IMPLANT
PACK CARDIAC CATH (CUSTOM PROCEDURE TRAY) ×3 IMPLANT
SHEATH AVANTI 6FR X 11CM (SHEATH) ×3 IMPLANT
WIRE G HI TQ BMW 190 (WIRE) ×3 IMPLANT
WIRE GUIDERIGHT .035X150 (WIRE) ×3 IMPLANT

## 2020-01-23 NOTE — Progress Notes (Signed)
Dr. E. [place], DO in at bedside interviewing pt. Post cath lab PCI.

## 2020-01-23 NOTE — Consult Note (Signed)
CARDIOLOGY CONSULT NOTE               Patient ID: [name] [name] MRN: [id number] DOB/AGE: December 02, 1970 49 y.o.  Admit date: 01/23/2020 Referring Physician Dr. Roxan Hockey emergency room Primary Physician [facility] Primary Cardiologist [place] Reason for Consultation STEMI late presentation elevated troponin  HPI: Patient is a 49 year old white male multiple medical problems diabetes hypertension previous CVA residual hemiparesis obesity reportedly had intractable nausea and vomiting over the last several days he has had similar presentations in the past presented to the emergency room and denied any discrete significant chest pain at the time he waited in the waiting room for several hours finally an EKG was performed which showed new Q waves and ST elevation laterally.  The patient troponin was then sent with came back at 4000 Covid was negative so the patient was brought to the cardiac Cath Lab for emergent diagnostic procedure with possible PCI and stent.  On evaluating the patient he really denied any significant chest pain at the time he had some vague chest spasms and cramping that lasted fleetingly he had significant nausea vomiting mild diarrhea diaphoresis weakness.  He has a history of rhythm issues sick sinus syndrome with a Linq device leadless pacemaker in place not on anticoagulation beyond Plavix  Review of systems complete and found to be negative unless listed above     Past Medical History:  Diagnosis Date  . Diabetes mellitus without complication (HCC)   . Hypertension   . Stroke [facility])     Past Surgical History:  Procedure Laterality Date  . APPENDECTOMY    . BACK SURGERY      Medications Prior to Admission  Medication Sig Dispense Refill Last Dose  . atenolol (TENORMIN) 50 MG tablet Take 50 mg by mouth at bedtime.      . Calcium Carb-Cholecalciferol (CALCIUM/VITAMIN D) 500-200 MG-UNIT TABS Take 1 tablet by mouth daily.     . chlorthalidone (HYGROTON) 50  MG tablet Take 50 mg by mouth daily.     . clopidogrel (PLAVIX) 75 MG tablet Take 1 tablet (75 mg total) by mouth daily. 30 tablet 0   . folic acid (FOLVITE) 1 MG tablet Take 1 mg by mouth daily.     . insulin aspart (NOVOLOG) 100 UNIT/ML injection Inject 0-10 Units into the skin 3 (three) times daily as needed for high blood sugar.      . insulin glargine (LANTUS) 100 UNIT/ML injection Inject 35 Units into the skin at bedtime.      . liraglutide (VICTOZA) 18 MG/3ML SOPN Inject into the skin daily.      Marland Kitchen losartan (COZAAR) 100 MG tablet Take 100 mg by mouth every evening.     . Multiple Vitamins-Minerals (CENTRUM FLAVOR BURST ADULT) CHEW Chew 1 each by mouth daily.     . Multiple Vitamins-Minerals (MULTIVITAMIN ADULT) TABS Take 1 tablet by mouth daily.     . traMADol (ULTRAM) 50 MG tablet Take 1 tablet (50 mg total) by mouth every 6 (six) hours as needed. 15 tablet 0    Social History   Socioeconomic History  . Marital status: Divorced    Spouse name: Not on file  . Number of children: Not on file  . Years of education: Not on file  . Highest education level: Not on file  Occupational History  . Not on file  Tobacco Use  . Smoking status: Current Every Day Smoker    Packs/day: 0.25  Types: Cigarettes  . Smokeless tobacco: Never Used  Vaping Use  . Vaping Use: Never used  Substance and Sexual Activity  . Alcohol use: No  . Drug use: No  . Sexual activity: Not on file  Other Topics Concern  . Not on file  Social History Narrative  . Not on file   Social Determinants of Health   Financial Resource Strain:   . Difficulty of Paying Living Expenses: Not on file  Food Insecurity:   . Worried About Programme researcher, broadcasting/film/video in the Last Year: Not on file  . Ran Out of Food in the Last Year: Not on file  Transportation Needs:   . Lack of Transportation (Medical): Not on file  . Lack of Transportation (Non-Medical): Not on file  Physical Activity:   . Days of Exercise per Week: Not  on file  . Minutes of Exercise per Session: Not on file  Stress:   . Feeling of Stress : Not on file  Social Connections:   . Frequency of Communication with Friends and Family: Not on file  . Frequency of Social Gatherings with Friends and Family: Not on file  . Attends Religious Services: Not on file  . Active Member of Clubs or Organizations: Not on file  . Attends Banker Meetings: Not on file  . Marital Status: Not on file  Intimate Partner Violence:   . Fear of Current or Ex-Partner: Not on file  . Emotionally Abused: Not on file  . Physically Abused: Not on file  . Sexually Abused: Not on file    Family History  Problem Relation Age of Onset  . Stroke Mother   . Heart failure Mother   . Heart failure Father   . Diabetes Father       Review of systems complete and found to be negative unless listed above      PHYSICAL EXAM  General: Well developed, well nourished, in no acute distress HEENT:  Normocephalic and atramatic Neck:  No JVD.  Lungs: Clear bilaterally to auscultation and percussion. Heart: Irregular irregular. Normal S1 and S2 without gallops or murmurs.  Abdomen: Bowel sounds are positive, abdomen soft and non-tender  Msk:  Back normal, normal gait. Normal strength and tone for age. Extremities: No clubbing, cyanosis or edema.   Neuro: Alert and oriented X 3. Psych:  Good affect, responds appropriately  Labs:   Lab Results  Component Value Date   WBC 12.5 (H) 01/23/2020   HGB 15.9 01/23/2020   HCT 44.2 01/23/2020   MCV 86.5 01/23/2020   PLT 268 01/23/2020    Recent Labs  Lab 01/22/20 2206  NA 139  K 4.2  CL 101  CO2 24  BUN 11  CREATININE 0.92  CALCIUM 9.5  PROT 7.8  BILITOT 1.5*  ALKPHOS 71  ALT 33  AST 36  GLUCOSE 134*   Lab Results  Component Value Date   CKTOTAL 145 03/09/2013   CKMB 1.5 03/09/2013   TROPONINI <0.03 09/26/2016    Lab Results  Component Value Date   CHOL 202 (H) 01/23/2020   CHOL 108  03/10/2017   CHOL 222 (H) 12/26/2016   Lab Results  Component Value Date   HDL 48 01/23/2020   HDL 25 (A) 03/10/2017   HDL 39 (L) 12/26/2016   Lab Results  Component Value Date   LDLCALC 126 (H) 01/23/2020   LDLCALC 39 03/10/2017   LDLCALC UNABLE TO CALCULATE IF TRIGLYCERIDE OVER 400 mg/dL 98/92/1194  Lab Results  Component Value Date   TRIG 142 01/23/2020   TRIG 219 (A) 03/10/2017   TRIG 439 (H) 12/26/2016   Lab Results  Component Value Date   CHOLHDL 4.2 01/23/2020   CHOLHDL 5.7 12/26/2016   No results found for: LDLDIRECT    Radiology: [facility] Chest Port 1 View  Result Date: 01/23/2020 CLINICAL DATA:  Code STEMI EXAM: PORTABLE CHEST 1 VIEW COMPARISON:  03/09/2013 FINDINGS: Normal heart size and mediastinal contours. There is no edema, consolidation, effusion, or pneumothorax. Implantable loop recorder and defibrillator pads. IMPRESSION: No radiographic abnormality. Electronically Signed   By: Marnee Spring M.D.   On: 01/23/2020 10:56    EKG: Atrial fibrillation rapid ventricular spine ST elevation anterior lateral with Q waves anterior septum  ASSESSMENT AND PLAN:  STEMI late presentation Elevated troponins Diabetes type 2 History of CVA Atrial fibrillation Hyperlipidemia Hypertension poorly controlled Smoking  Plan Agree with admit as a potential STEMI with emergent cath Recommend intention to treat with PCI and stent if possible Continue beta-blocker ARB Plavix aspirin Recommend continue statin therapy for hyperlipidemia More aggressive blood pressure control Advised patient to refrain from smoking Sick sinus syndrome Lynx device in place\ Case discussed with hospitalist Recommend admit and continue with conservative medical management  Signed: Alwyn Pea MD 01/23/2020, 12:04 PM

## 2020-01-23 NOTE — ED Notes (Signed)
Cardiology at bedside.

## 2020-01-23 NOTE — Progress Notes (Signed)
CH visited pt. in responded to CODE STEMI in ED; pt. lying in bed being attended by medical team when [facility] arrived; plan is for pt. to go to cath lab, per cardiologist bedside.  CH introduced himself; pt. shared he came to hiospital w/severe vomiting; shares no pain at this time.  Pt. says he was married briefly in 2017 but that his wife left him after he had a stroke.   When pt. realized CH role, he shared he became a [place] in 2016 after witnessing his father's peace and comfort on his deathbed in 2016 as his father shared that he would soon 'be with Jesus.' Pt. requested prayer.  No family present but Pt. says his daughter may visit 'in a while.'  CH remains available as needed.

## 2020-01-23 NOTE — ED Notes (Signed)
Labs called with trop [phone]

## 2020-01-23 NOTE — Progress Notes (Signed)
ANTICOAGULATION CONSULT NOTE - Initial Consult  Pharmacy Consult for Aggrastat  Indication:Post PCI  Allergies  Allergen Reactions  . Penicillins Other (See Comments) and Anaphylaxis    Has patient had a PCN reaction causing immediate rash, facial/tongue/throat swelling, SOB or lightheadedness with hypotension: Yes Has patient had a PCN reaction causing severe rash involving mucus membranes or skin necrosis: No Has patient had a PCN reaction that required hospitalization: No Has patient had a PCN reaction occurring within the last 10 years: No If all of the above answers are "NO", then may proceed with Cephalosporin use.     Patient Measurements: Height: 5\' 9"  (175.3 cm) Weight: 83.9 kg (185 lb) IBW/kg (Calculated) : 70.7 Heparin Dosing Weight  Vital Signs: Temp: 97.5 F (36.4 C) (10/01 0528) Temp Source: Oral (10/01 0528) BP: 138/101 (10/01 1315) Pulse Rate: 90 (10/01 1315)  Labs: Recent Labs    01/22/20 2206 01/23/20 1046 01/23/20 1232  HGB 14.9 15.9  --   HCT 40.8 44.2  --   PLT 257 268  --   APTT  --  28  --   LABPROT  --  12.5  --   INR  --  1.0  --   CREATININE 0.92  --   --   TROPONINIHS 4,125* 3,215* 3,365*    Estimated Creatinine Clearance: 99.3 mL/min (by C-G formula based on SCr of 0.92 mg/dL).   Medical History: Past Medical History:  Diagnosis Date  . Diabetes mellitus without complication (HCC)   . Hypertension   . Stroke [facility])     Medications:  Scheduled:  . labetalol      . aspirin  81 mg Oral Daily  . [START ON 01/24/2020] clopidogrel  75 mg Oral Q breakfast  . sodium chloride flush  3 mL Intravenous Q12H   Infusions:  . sodium chloride 20 mL/hr at 01/23/20 1056  . sodium chloride    . sodium chloride 1 mL/kg/hr (01/23/20 1250)  . tirofiban      Assessment: 49 yo male s/p PCI in Cath Lab. To continue Aggrastat drip x 18 hrs post PCI. Heparin drip discontinued. Hgb 15.9  plt 268  INR 1.0  APTT 28 Home antiplatelet: plavix. Wt  83.9 kg  Crcl 99.3 ml/min  Goal of Therapy:  Monitor platelets by anticoagulation protocol: Yes   Plan:  Will order Aggrastat (Tirofiban) drip at 0.15 mcg/kg/min x 83.9 kg x 18 hrs post PCI.  Crcl > 60 ml/min.   Karin Griffith A 01/23/2020,1:46 PM

## 2020-01-23 NOTE — ED Notes (Signed)
Pt taken to cath lab by this rn and reed rn

## 2020-01-23 NOTE — ED Notes (Signed)
Checking CBG per verbal from Dr. Juliann Pares given pt hx/o DM with insulin use

## 2020-01-23 NOTE — ED Provider Notes (Signed)
[facility] Emergency Department Provider Note  ____________________________________________   First MD Initiated Contact with Patient 01/23/20 1045     (approximate)  I have reviewed the triage vital signs and the nursing notes.   HISTORY  Chief Complaint Emesis    HPI [name] [name] is a 49 y.o. male with history of diabetes, hypertension, anxiety, here with nausea and vomiting.  The patient initially was here because he has had approximately 1 week of intermittent but persistent nausea and vomiting.  He states that every time he tries to eat, he vomits, and has also had nausea both with exertion as well spontaneously.  He denies any significant pain with this.  No fevers.  He states that while in the waiting room overnight, he has developed intermittent sharp, transient chest pains that he describes as spasm-like pain.  Denies any specific associated shortness of breath or diaphoresis.  He has had persistent nausea and vomiting.  No diarrhea.  No other complaints.        Past Medical History:  Diagnosis Date  . Diabetes mellitus without complication (HCC)   . Hypertension   . Stroke [facility])     Patient Active Problem List   Diagnosis Date Noted  . Non-STEMI (non-ST elevated myocardial infarction) (HCC) 04/12/2019  . 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 Essential hypertension, benign 01/16/2017  . Type II diabetes mellitus with neurological manifestations, uncontrolled (HCC) 01/16/2017  . Dyslipidemia associated with type 2 diabetes mellitus (HCC) 01/16/2017  . Depression with anxiety 01/16/2017  . Hemiparesis of dominant side due to recent cerebrovascular accident (CVA) (HCC) 01/04/2017  . Dysarthria due to recent cerebrovascular accident (CVA) 01/04/2017  . Recent cerebrovascular accident (CVA) 01/04/2017  . Stroke [facility]) -  acute infarct at left PLIC, likely small vessel disease due to poorly controlled diabetes and hyperlipidemia. 12/25/2016   . Chronic cerebrovascular accident (CVA) 12/25/2016    Past Surgical History:  Procedure Laterality Date  . APPENDECTOMY    . BACK SURGERY      Prior to Admission medications   Medication Sig Start Date End Date Taking? Authorizing Provider  atenolol (TENORMIN) 50 MG tablet Take 50 mg by mouth at bedtime.     [provider]  Calcium Carb-Cholecalciferol (CALCIUM/VITAMIN D) 500-200 MG-UNIT TABS Take 1 tablet by mouth daily.    [provider]  chlorthalidone (HYGROTON) 50 MG tablet Take 50 mg by mouth daily.    [provider]  clopidogrel (PLAVIX) 75 MG tablet Take 1 tablet (75 mg total) by mouth daily. 01/03/17   Patteson, Paul Dykes, NP  folic acid (FOLVITE) 1 MG tablet Take 1 mg by mouth daily.    [provider]  insulin aspart (NOVOLOG) 100 UNIT/ML injection Inject 0-10 Units into the skin 3 (three) times daily as needed for high blood sugar.     [provider]  insulin glargine (LANTUS) 100 UNIT/ML injection Inject 35 Units into the skin at bedtime.     [provider]  liraglutide (VICTOZA) 18 MG/3ML SOPN Inject into the skin daily.     [provider]  losartan (COZAAR) 100 MG tablet Take 100 mg by mouth every evening.    [provider]  Multiple Vitamins-Minerals (CENTRUM FLAVOR BURST ADULT) CHEW Chew 1 each by mouth daily.    [provider]  Multiple Vitamins-Minerals (MULTIVITAMIN ADULT) TABS Take 1 tablet by mouth daily.    [provider]  traMADol (ULTRAM) 50 MG tablet Take 1 tablet (  50 mg total) by mouth every 6 (six) hours as needed. 12/06/17   Mesner, Barbara Cower, MD    Allergies Penicillins  Family History  Problem Relation Age of Onset  . Stroke Mother   . Heart failure Mother   . Heart failure Father   . Diabetes Father     Social History Social History   Tobacco Use  . Smoking status: Current Every Day Smoker    Packs/day: 0.25    Types: Cigarettes  . Smokeless  tobacco: Never Used  Vaping Use  . Vaping Use: Never used  Substance Use Topics  . Alcohol use: No  . Drug use: No    Review of Systems  Review of Systems  Constitutional: Positive for fatigue. Negative for chills and fever.  HENT: Negative for sore throat.   Respiratory: Positive for chest tightness and shortness of breath.   Cardiovascular: Positive for chest pain.  Gastrointestinal: Positive for nausea and vomiting. Negative for abdominal pain.  Genitourinary: Negative for flank pain.  Musculoskeletal: Negative for neck pain.  Skin: Negative for rash and wound.  Allergic/Immunologic: Negative for immunocompromised state.  Neurological: Negative for weakness and numbness.  Hematological: Does not bruise/bleed easily.  All other systems reviewed and are negative.    ____________________________________________  PHYSICAL EXAM:      VITAL SIGNS: ED Triage Vitals [01/22/20 2203]  Enc Vitals Group     BP (!) 183/112     Pulse Rate (!) 118     Resp 20     Temp 99.1 F (37.3 C)     Temp Source Oral     SpO2 96 %     Weight 185 lb (83.9 kg)     Height 5\' 9"  (1.753 m)     Head Circumference      Peak Flow      Pain Score 0     Pain Loc      Pain Edu?      Excl. in GC?      Physical Exam Vitals and nursing note reviewed.  Constitutional:      General: He is not in acute distress.    Appearance: He is well-developed.  HENT:     Head: Normocephalic and atraumatic.  Eyes:     Conjunctiva/sclera: Conjunctivae normal.  Cardiovascular:     Rate and Rhythm: Normal rate and regular rhythm.     Heart sounds: Normal heart sounds. No murmur heard.  No friction rub.  Pulmonary:     Effort: Pulmonary effort is normal. No respiratory distress.     Breath sounds: Normal breath sounds. No wheezing or rales.  Abdominal:     General: Abdomen is flat. There is no distension.     Palpations: Abdomen is soft.     Tenderness: There is no abdominal tenderness.     Comments: No  focal tenderness, no rebound or guarding  Musculoskeletal:     Cervical back: Neck supple.  Skin:    General: Skin is warm.     Capillary Refill: Capillary refill takes less than 2 seconds.  Neurological:     Mental Status: He is alert and oriented to person, place, and time.     Motor: No abnormal muscle tone.  Psychiatric:     Comments: Anxious       ____________________________________________   LABS (all labs ordered are listed, but only abnormal results are displayed)  Labs Reviewed  CBC - Abnormal; Notable for the following components:      Result  Value   WBC 10.8 (*)    MCHC 36.5 (*)    All other components within normal limits  COMPREHENSIVE METABOLIC PANEL - Abnormal; Notable for the following components:   Glucose, Bld 134 (*)    Total Bilirubin 1.5 (*)    All other components within normal limits  CBC WITH DIFFERENTIAL/PLATELET - Abnormal; Notable for the following components:   WBC 12.5 (*)    Neutro Abs 8.9 (*)    All other components within normal limits  TROPONIN I (HIGH SENSITIVITY) - Abnormal; Notable for the following components:   Troponin I (High Sensitivity) 4,125 (*)    All other components within normal limits  RESPIRATORY PANEL BY RT PCR (FLU A&B, COVID)  URINALYSIS, COMPLETE (UACMP) WITH MICROSCOPIC  HEMOGLOBIN A1C  PROTIME-INR  APTT  LIPID PANEL  TROPONIN I (HIGH SENSITIVITY)    ____________________________________________  EKG: Sinus tachycardia, ventricular rate 102.  PR 157, QRS 85, QTc 447.  There are diffuse ST elevations in the inferior and lateral precordial leads, as well as T wave inversion/flattening in the lateral leads, concerning for ST elevation MI. ________________________________________  RADIOLOGY All imaging, including plain films, CT scans, and ultrasounds, independently reviewed by me, and interpretations confirmed via formal radiology reads.  ED MD interpretation:   Chest x-ray: Negative, normal  mediastinum  Official radiology report(s): DG Chest Port 1 View  Result Date: 01/23/2020 CLINICAL DATA:  Code STEMI EXAM: PORTABLE CHEST 1 VIEW COMPARISON:  03/09/2013 FINDINGS: Normal heart size and mediastinal contours. There is no edema, consolidation, effusion, or pneumothorax. Implantable loop recorder and defibrillator pads. IMPRESSION: No radiographic abnormality. Electronically Signed   By: Marnee Spring M.D.   On: 01/23/2020 10:56    ____________________________________________  PROCEDURES   Procedure(s) performed (including Critical Care):  .Critical Care Performed by: Shaune Pollack, MD Authorized by: Shaune Pollack, MD   Critical care provider statement:    Critical care time (minutes):  35   Critical care time was exclusive of:  Separately billable procedures and treating other patients and teaching time   Critical care was necessary to treat or prevent imminent or life-threatening deterioration of the following conditions:  Circulatory failure, cardiac failure and respiratory failure   Critical care was time spent personally by me on the following activities:  Development of treatment plan with patient or surrogate, discussions with consultants, evaluation of patient's response to treatment, examination of patient, obtaining history from patient or surrogate, ordering and performing treatments and interventions, ordering and review of laboratory studies, ordering and review of radiographic studies, pulse oximetry, re-evaluation of patient's condition and review of old charts   I assumed direction of critical care for this patient from another provider in my specialty: no      ____________________________________________  INITIAL IMPRESSION / MDM / ASSESSMENT AND PLAN / ED COURSE  As part of my medical decision making, I reviewed the following data within the electronic MEDICAL RECORD NUMBER Nursing notes reviewed and incorporated, Old chart reviewed, Notes from prior ED  visits, and [place] Controlled Substance Database       *[name] [name] was evaluated in Emergency Department on 01/23/2020 for the symptoms described in the history of present illness. He was evaluated in the context of the global COVID-19 pandemic, which necessitated consideration that the patient might be at risk for infection with the SARS-CoV-2 virus that causes COVID-19. Institutional protocols and algorithms that pertain to the evaluation of patients at risk for COVID-19 are in a state of rapid change based  on information released by regulatory bodies including the CDC and federal and state organizations. These policies and algorithms were followed during the patient's care in the ED.  Some ED evaluations and interventions may be delayed as a result of limited staffing during the pandemic.*     Medical Decision Making: 49 year old diabetic male here with somewhat atypical chest pain.  Patient was in the waiting room overnight with overall unremarkable initial lab work.  He did complain of some intermittent chest pains, and EKG was shown to me this morning.  On review, concern for ST elevation MI, with possible inferolateral STEMI.  Cardiology immediately paged upon my receipt of the EKG, and was fortunately already in the ED and evaluated the patient.  This was at approximately 10:25 AM.  Patient given aspirin, heparin, and will be taken to the Cath Lab.  Unfortunately, it appears that he has fairly deep Q waves in the inferior leads and I suspect that his symptoms of intermittent nausea and vomiting could be related to cardiac event earlier this week.  Will go to the Cath Lab with admission thereafter.  Covid negative.  ____________________________________________  FINAL CLINICAL IMPRESSION(S) / ED DIAGNOSES  Final diagnoses:  ST elevation myocardial infarction (STEMI), unspecified artery (HCC)     MEDICATIONS GIVEN DURING THIS VISIT:  Medications  0.9 %  sodium chloride infusion (  Intravenous New Bag/Given 01/23/20 1056)  heparin ADULT infusion 100 units/mL (25000 units/254mL sodium chloride 0.45%) (1,100 Units/hr Intravenous Not Given 01/23/20 1102)  aspirin chewable tablet 324 mg (324 mg Oral Given 01/23/20 1049)  ondansetron (ZOFRAN) injection 4 mg (4 mg Intravenous Given 01/23/20 1056)     ED Discharge Orders    None       Note:  This document was prepared using Dragon voice recognition software and may include unintentional dictation errors.   Shaune Pollack, MD 01/23/20 1104

## 2020-01-23 NOTE — H&P (Signed)
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  Patient reports onset of nausea and vomiting last Friday which continued with progressive shortness of breath and chest pain.  Patient is followed by the [facility] outpatient.  He reports compliance with his home medications. At baseline he is ambulatory with use of a wheelchair.  ED Course: Temperature 97.5, heart rate 112, BP 190/100, SPO2 96% on room air.  Sodium 139, potassium 4.2, chloride 101, CO2 29, BUN 11, creatinine 0.92, glucose 134.  The BC count 12.5, hemoglobin 15.9, platelets 268.  Troponin 4125.  Chest x-ray with no acute cardiopulmonary disease process. EKG with sinus tachycardia, rate 135, ST elevation noted in leads II, III, aVF and V3 through V6.  Cardiology was consulted and patient underwent cardiac catheterization on 01/23/2020.  Hospitalist service consult for admission  Review of Systems: As per HPI otherwise 10 point review of systems negative.    Past Medical History:  Diagnosis Date  . Diabetes mellitus without complication (HCC)   . Hypertension   . Stroke [facility])     Past Surgical History:  Procedure Laterality Date  . APPENDECTOMY    . BACK SURGERY       reports that he has been smoking cigarettes. He has been smoking about 0.25 packs per day. He has never used smokeless tobacco. He reports that he does not drink alcohol and does not use drugs.  Allergies  Allergen Reactions   . Penicillins Other (See Comments) and Anaphylaxis    Has patient had a PCN reaction causing immediate rash, facial/tongue/throat swelling, SOB or lightheadedness with hypotension: Yes Has patient had a PCN reaction causing severe rash involving mucus membranes or skin necrosis: No Has patient had a PCN reaction that required hospitalization: No Has patient had a PCN reaction occurring within the last 10 years: No If all of the above answers are "NO", then may proceed with Cephalosporin use.     Family History  Problem Relation Age of Onset  . Stroke Mother   . Heart failure Mother   . Heart failure Father   . Diabetes Father     Family history reviewed and not pertinent   Prior to Admission medications   Medication Sig Start Date End Date Taking? Authorizing Provider  atorvastatin (LIPITOR) 80 MG tablet Take 80 mg by mouth daily.   Yes [provider]  buPROPion (WELLBUTRIN XL) 150 MG 24 hr tablet Take 150 mg by mouth daily.   Yes [provider]  citalopram (CELEXA) 40 MG tablet Take 40 mg by mouth daily.   Yes [provider]  ezetimibe (ZETIA) 10 MG tablet Take 10 mg by mouth daily.   Yes [provider]  tamsulosin (FLOMAX) 0.4 MG CAPS capsule Take 0.4 mg by mouth.   Yes [provider]  triamterene-hydrochlorothiazide (DYAZIDE) 37.5-25 MG capsule Take 1 capsule by mouth daily.   Yes [provider]  atenolol (TENORMIN) 50 MG tablet Take 50 mg by  mouth at bedtime.     [provider]  Calcium Carb-Cholecalciferol (CALCIUM/VITAMIN D) 500-200 MG-UNIT TABS Take 1 tablet by mouth daily.    [provider]  chlorthalidone (HYGROTON) 50 MG tablet Take 50 mg by mouth daily.    [provider]  clopidogrel (PLAVIX) 75 MG tablet Take 1 tablet (75 mg total) by mouth daily. 01/03/17   Patteson, Paul Dykes, NP  folic acid (FOLVITE) 1 MG tablet Take 1 mg by mouth daily.    [provider]  insulin aspart  (NOVOLOG) 100 UNIT/ML injection Inject 0-10 Units into the skin 3 (three) times daily as needed for high blood sugar.     [provider]  insulin glargine (LANTUS) 100 UNIT/ML injection Inject 35 Units into the skin at bedtime.     [provider]  liraglutide (VICTOZA) 18 MG/3ML SOPN Inject into the skin daily.     [provider]  losartan (COZAAR) 100 MG tablet Take 100 mg by mouth every evening.    [provider]  Multiple Vitamins-Minerals (CENTRUM FLAVOR BURST ADULT) CHEW Chew 1 each by mouth daily.    [provider]  Multiple Vitamins-Minerals (MULTIVITAMIN ADULT) TABS Take 1 tablet by mouth daily.    [provider]  traMADol (ULTRAM) 50 MG tablet Take 1 tablet (50 mg total) by mouth every 6 (six) hours as needed. 12/06/17   Mesner, Barbara Cower, MD    Physical Exam: Vitals:   01/23/20 1058 01/23/20 1059 01/23/20 1215 01/23/20 1245  BP:  (!) 191/125 (!) 135/99 (!) 133/92  Pulse: (!) 112  100 88  Resp: 12  19 18   Temp:      TempSrc:      SpO2:  96% 92% 96%  Weight:      Height:        Constitutional: NAD, calm, comfortable Eyes: PERRL, lids and conjunctivae normal ENMT: Mucous membranes are moist. Posterior pharynx clear of any exudate or lesions.Normal dentition.  Neck: normal, supple, no masses, no thyromegaly Respiratory: clear to auscultation bilaterally, no wheezing, no crackles. Normal respiratory effort. No accessory muscle use.  Oxygenating well on room air Cardiovascular: Tachycardic, regular rhythm, no murmurs / rubs / gallops. No extremity edema. 2+ pedal pulses. No carotid bruits.  Abdomen: no tenderness, no masses palpated. No hepatosplenomegaly. Bowel sounds positive.  Musculoskeletal: no clubbing / cyanosis. No joint deformity upper and lower extremities. Good ROM, no contractures. Normal muscle tone.  Skin: no rashes, lesions, ulcers. No induration, noted catheterization sheath in groin Neurologic: CN 2-12 grossly  intact. Sensation intact, DTR normal. Strength 5/5 in all 4.  Psychiatric: Normal judgment and insight. Alert and oriented x 3. Normal mood.    Labs on Admission: I have personally reviewed following labs and imaging studies  CBC: Recent Labs  Lab 01/22/20 2206 01/23/20 1046  WBC 10.8* 12.5*  NEUTROABS  --  8.9*  HGB 14.9 15.9  HCT 40.8 44.2  MCV 85.2 86.5  PLT 257 268   Basic Metabolic Panel: Recent Labs  Lab 01/22/20 2206  NA 139  K 4.2  CL 101  CO2 24  GLUCOSE 134*  BUN 11  CREATININE 0.92  CALCIUM 9.5   GFR: Estimated Creatinine Clearance: 99.3 mL/min (by C-G formula based on SCr of 0.92 mg/dL). Liver Function Tests: Recent Labs  Lab 01/22/20 2206  AST 36  ALT 33  ALKPHOS 71  BILITOT 1.5*  PROT 7.8  ALBUMIN 3.5   No results for input(s): LIPASE, AMYLASE in the  last 168 hours. No results for input(s): AMMONIA in the last 168 hours. Coagulation Profile: Recent Labs  Lab 01/23/20 1046  INR 1.0   Cardiac Enzymes: No results for input(s): CKTOTAL, CKMB, CKMBINDEX, TROPONINI in the last 168 hours. BNP (last 3 results) No results for input(s): PROBNP in the last 8760 hours. HbA1C: No results for input(s): HGBA1C in the last 72 hours. CBG: Recent Labs  Lab 01/23/20 1108 01/23/20 1240  GLUCAP 283* 279*   Lipid Profile: Recent Labs    01/23/20 1046  CHOL 202*  HDL 48  LDLCALC 126*  TRIG 142  CHOLHDL 4.2   Thyroid Function Tests: No results for input(s): TSH, T4TOTAL, FREET4, T3FREE, THYROIDAB in the last 72 hours. Anemia Panel: No results for input(s): VITAMINB12, FOLATE, FERRITIN, TIBC, IRON, RETICCTPCT in the last 72 hours. Urine analysis:    Component Value Date/Time   COLORURINE YELLOW 12/25/2016 1949   APPEARANCEUR CLEAR 12/25/2016 1949   LABSPEC 1.026 12/25/2016 1949   PHURINE 6.0 12/25/2016 1949   GLUCOSEU >=500 (A) 12/25/2016 1949   HGBUR NEGATIVE 12/25/2016 1949   BILIRUBINUR NEGATIVE 12/25/2016 1949   KETONESUR NEGATIVE  12/25/2016 1949   PROTEINUR >=300 (A) 12/25/2016 1949   NITRITE NEGATIVE 12/25/2016 1949   LEUKOCYTESUR NEGATIVE 12/25/2016 1949    Radiological Exams on Admission: DG Chest Port 1 View  Result Date: 01/23/2020 CLINICAL DATA:  Code STEMI EXAM: PORTABLE CHEST 1 VIEW COMPARISON:  03/09/2013 FINDINGS: Normal heart size and mediastinal contours. There is no edema, consolidation, effusion, or pneumothorax. Implantable loop recorder and defibrillator pads. IMPRESSION: No radiographic abnormality. Electronically Signed   By: Marnee Spring M.D.   On: 01/23/2020 10:56    EKG: Independently reviewed.  Sinus tachycardia, rate 135, ST elevation in leads II, 3, aVF, V3-V6.  Assessment/Plan Principal Problem:   STEMI involving left anterior descending coronary artery (HCC) Active Problems:   Chronic cerebrovascular accident (CVA)   Essential hypertension, benign   Type II diabetes mellitus with neurological manifestations, uncontrolled (HCC)   Dyslipidemia associated with type 2 diabetes mellitus (HCC)   Depression with anxiety   STEMI (ST elevation myocardial infarction) (HCC)    ST elevation myocardial infarction Patient presented via EMS to the ED with nausea/vomiting, shortness of breath and chest discomfort.  Troponin elevated 4125.  EKG notable for sinus tachycardia with ST elevation in leads II, III, aVF and leads V3 through V6.  Patient was initially started on heparin drip, cardiology was consulted and patient underwent cardiac catheterization on 01/23/2020 with distal lesion with balloon angioplasty without stent placement.  Cholesterol panel with total cholesterol 202, HDL 48, LDL 126.  --Admit to inpatient, progressive unit --Aspirin 81 mg p.o. daily --Plavix 75 mg p.o. daily --Atenolol 50 mg p.o. nightly --Losartan 100 g p.o. daily --Atorvastatin 80 mg p.o. daily --Continue monitor on telemetry  Essential hypertension BP elevated on admission, 190/100.  Patient states he misses  medicines day prior in route to ED.  But he is usually fairly compliant. --Continue home atenolol 50 mg p.o. nightly, losartan 100 mg p.o. daily, triamterene-HCTZ 37.5-25 mg p.o. daily --Hydralazine as needed --Continue monitor blood pressure closely and adjust as needed  Type 2 diabetes mellitus Last hemoglobin A1c 12.5 on 04/12/19.  Currently on Lantus 35 units nightly, Victoza 18 mg daily. --Check hemoglobin A1c --Continue Lantus 35 units daily --Moderate insulin sliding scale for further coverage --CBGs before every meal/at bedtime  Dyslipidemia --Atorvastatin 80 mg p.o. daily --Ezetimibe 10 mg p.o. daily  Depression with anxiety --Bupropion  150 mg p.o. daily, citalopram 40 mg p.o. daily  History of CVA with residual weakness Patient currently lives alone in a duplex.  He is wheelchair-bound at baseline, but states is able to accomplish ADLs independently. --Continue aspirin, Plavix, statin --PT/OT evaluation in the a.m.   DVT prophylaxis: Lovenox Code Status: Full code Family Communication: Updated patient extensively at bedside Disposition Plan: Plan to return home when medically stable Consults called: Cardiology, Dr. Juliann Pares Admission status: Inpatient   Severity of Illness: The appropriate patient status for this patient is INPATIENT. Inpatient status is judged to be reasonable and necessary in order to provide the required intensity of service to ensure the patient's safety. The patient's presenting symptoms, physical exam findings, and initial radiographic and laboratory data in the context of their chronic comorbidities is felt to place them at high risk for further clinical deterioration. Furthermore, it is not anticipated that the patient will be medically stable for discharge from the hospital within 2 midnights of admission. The following factors support the patient status of inpatient.   " The patient's presenting symptoms include nausea, vomiting, chest  pain " The worrisome physical exam findings include tachycardic " The initial radiographic and laboratory data are worrisome because of ST elevation on EKG, elevated troponin " The chronic co-morbidities include hypertension, diabetes, tobacco abuse, history of CVA.   * I certify that at the point of admission it is my clinical judgment that the patient will require inpatient hospital care spanning beyond 2 midnights from the point of admission due to high intensity of service, high risk for further deterioration and high frequency of surveillance required.*    Alvira Philips [place] DO [facility] Available via Epic secure chat 7am-7pm After these hours, please refer to coverage provider listed on [email or web address] 01/23/2020, 1:09 PM

## 2020-01-23 NOTE — ED Notes (Signed)
CBG 283 

## 2020-01-23 NOTE — ED Notes (Signed)
EDP at bedside  

## 2020-01-23 NOTE — ED Notes (Signed)
X-ray at bedside

## 2020-01-23 NOTE — Progress Notes (Signed)
ANTICOAGULATION CONSULT NOTE - Initial Consult  Pharmacy Consult for Heparin drip Indication: chest pain/ACS  Allergies  Allergen Reactions  . Penicillins Other (See Comments) and Anaphylaxis    Has patient had a PCN reaction causing immediate rash, facial/tongue/throat swelling, SOB or lightheadedness with hypotension: Yes Has patient had a PCN reaction causing severe rash involving mucus membranes or skin necrosis: No Has patient had a PCN reaction that required hospitalization: No Has patient had a PCN reaction occurring within the last 10 years: No If all of the above answers are "NO", then may proceed with Cephalosporin use.     Patient Measurements: Height: 5\' 9"  (175.3 cm) Weight: 83.9 kg (185 lb) IBW/kg (Calculated) : 70.7 Heparin Dosing Weight: 83.9 kg  Vital Signs: Temp: 97.5 F (36.4 C) (10/01 0528) Temp Source: Oral (10/01 0528) BP: 191/125 (10/01 1059) Pulse Rate: 112 (10/01 1058)  Labs: Recent Labs    01/22/20 2206 01/23/20 1046  HGB 14.9 15.9  HCT 40.8 44.2  PLT 257 268  APTT  --  28  LABPROT  --  12.5  INR  --  1.0  CREATININE 0.92  --   TROPONINIHS 4,125* 3,215*    Estimated Creatinine Clearance: 99.3 mL/min (by C-G formula based on SCr of 0.92 mg/dL).   Medical History: Past Medical History:  Diagnosis Date  . Diabetes mellitus without complication (HCC)   . Hypertension   . Stroke [facility])     Medications:  Scheduled:   Infusions:  . sodium chloride 20 mL/hr at 01/23/20 1056  . bivalirudin (ANGIOMAX) infusion 5 mg/mL 1.75 mg/kg/hr (01/23/20 1133)  . heparin      Assessment: 49 yo M to start Heaprin drip for ACS/STEMI.. Hgb 15.9  plt 268  INR 1.0  APTT 28 Home antiplatelet: plavix. Pateint going to Cath Lab   Goal of Therapy:  Heparin level 0.3-0.7 units/ml Monitor platelets by anticoagulation protocol: Yes   Plan:  Give 4000 units bolus x 1 Start heparin infusion at 1100 units/hr Check anti-Xa level in 6 hours and daily  while on heparin Continue to monitor H&H and platelets  Chauntel Windsor A 01/23/2020,11:32 AM

## 2020-01-23 NOTE — ED Notes (Signed)
Pt continues to deny pain at this time

## 2020-01-23 NOTE — ED Notes (Signed)
Pt denies chest pain but states that he does feel like he is having muscle spasms

## 2020-01-23 NOTE — ED Notes (Signed)
Date and time results received: 01/23/20 1101 (use smartphrase ".now" to insert current time)  Test: troponin  Critical Value: 4125  Name of Provider Notified: Dr. Erma Heritage, Dr. Juliann Pares   Orders Received? Or Actions Taken?: pt going to cath lab

## 2020-01-24 ENCOUNTER — Inpatient Hospital Stay
Admit: 2020-01-24 | Discharge: 2020-01-24 | Disposition: A | Discharge status: Home / Self Care | Payer: No Typology Code available for payment source | Attending: Internal Medicine | Admitting: Internal Medicine

## 2020-01-24 DIAGNOSIS — I2102 ST elevation (STEMI) myocardial infarction involving left anterior descending coronary artery: Secondary | ICD-10-CM | POA: Diagnosis not present

## 2020-01-24 DIAGNOSIS — Z955 Presence of coronary angioplasty implant and graft: Secondary | ICD-10-CM

## 2020-01-24 LAB — COMPREHENSIVE METABOLIC PANEL
ALT: 23 U/L (ref 0–44)
AST: 25 U/L (ref 15–41)
Albumin: 2.8 g/dL — ABNORMAL LOW (ref 3.5–5.0)
Alkaline Phosphatase: 60 U/L (ref 38–126)
Anion gap: 7 (ref 5–15)
BUN: 14 mg/dL (ref 6–20)
CO2: 26 mmol/L (ref 22–32)
Calcium: 8.9 mg/dL (ref 8.9–10.3)
Chloride: 102 mmol/L (ref 98–111)
Creatinine, Ser: 0.98 mg/dL (ref 0.61–1.24)
GFR calc Af Amer: >60 mL/min (ref >60)
GFR calc non Af Amer: >60 mL/min (ref >60)
Glucose, Bld: 201 mg/dL — ABNORMAL HIGH (ref 70–99)
Potassium: 3.8 mmol/L (ref 3.5–5.1)
Sodium: 135 mmol/L (ref 135–145)
Total Bilirubin: 1 mg/dL (ref 0.3–1.2)
Total Protein: 6.2 g/dL — ABNORMAL LOW (ref 6.5–8.1)

## 2020-01-24 LAB — CBC
HCT: 35.6 % — ABNORMAL LOW (ref 39.0–52.0)
Hemoglobin: 12.3 g/dL — ABNORMAL LOW (ref 13.0–17.0)
MCH: 30.2 pg (ref 26.0–34.0)
MCHC: 34.6 g/dL (ref 30.0–36.0)
MCV: 87.5 fL (ref 80.0–100.0)
Platelets: 231 10*3/uL (ref 150–400)
RBC: 4.07 MIL/uL — ABNORMAL LOW (ref 4.22–5.81)
RDW: 11.8 % (ref 11.5–15.5)
WBC: 8.5 10*3/uL (ref 4.0–10.5)
nRBC: 0 % (ref 0.0–0.2)

## 2020-01-24 LAB — MAGNESIUM: Magnesium: 1.8 mg/dL (ref 1.7–2.4)

## 2020-01-24 LAB — GLUCOSE, CAPILLARY
Glucose-Capillary: 181 mg/dL — ABNORMAL HIGH (ref 70–99)
Glucose-Capillary: 270 mg/dL — ABNORMAL HIGH (ref 70–99)
Glucose-Capillary: 217 mg/dL — ABNORMAL HIGH (ref 70–99)
Glucose-Capillary: 256 mg/dL — ABNORMAL HIGH (ref 70–99)

## 2020-01-24 LAB — TROPONIN I (HIGH SENSITIVITY)
Troponin I (High Sensitivity): 6066 ng/L (ref <18)
Troponin I (High Sensitivity): 5233 ng/L (ref <18)

## 2020-01-24 LAB — HEMOGLOBIN A1C
Hgb A1c MFr Bld: 10.1 % — ABNORMAL HIGH (ref 4.8–5.6)
Mean Plasma Glucose: 243.17 mg/dL

## 2020-01-24 MED ORDER — ATENOLOL 100 MG PO TABS
100.0000 mg | ORAL_TABLET | Freq: Every day | ORAL | Status: DC
  Filled 2020-01-24: qty 1

## 2020-01-24 MED ORDER — INSULIN ASPART 100 UNIT/ML ~~LOC~~ SOLN
SUBCUTANEOUS | Status: AC
  Filled 2020-01-24: qty 1

## 2020-01-24 MED ORDER — INSULIN ASPART 100 UNIT/ML ~~LOC~~ SOLN
SUBCUTANEOUS | Status: AC
  Administered 2020-01-24: 3 [IU] via SUBCUTANEOUS
  Filled 2020-01-24: qty 1

## 2020-01-24 MED ORDER — ATENOLOL 50 MG PO TABS
50.0000 mg | ORAL_TABLET | Freq: Two times a day (BID) | ORAL | Status: DC
  Administered 2020-01-24 – 2020-01-25 (×2): 50 mg via ORAL
  Filled 2020-01-24 (×4): qty 1

## 2020-01-24 MED ORDER — PERFLUTREN LIPID MICROSPHERE
1.0000 mL | INTRAVENOUS | Status: AC | PRN
  Administered 2020-01-24: 3 mL via INTRAVENOUS
  Filled 2020-01-24: qty 10

## 2020-01-24 MED ORDER — AMLODIPINE BESYLATE 5 MG PO TABS
5.0000 mg | ORAL_TABLET | Freq: Every day | ORAL | Status: DC
  Administered 2020-01-24 – 2020-01-25 (×2): 5 mg via ORAL
  Filled 2020-01-24 (×3): qty 1

## 2020-01-24 MED ORDER — ENOXAPARIN SODIUM 40 MG/0.4ML ~~LOC~~ SOLN
SUBCUTANEOUS | Status: AC
  Administered 2020-01-24: 40 mg via SUBCUTANEOUS
  Filled 2020-01-24: qty 0.4

## 2020-01-24 MED ORDER — ATORVASTATIN CALCIUM 20 MG PO TABS
80.0000 mg | ORAL_TABLET | Freq: Every day | ORAL | Status: DC
  Administered 2020-01-24 – 2020-01-25 (×2): 80 mg via ORAL
  Filled 2020-01-24 (×4): qty 4

## 2020-01-24 NOTE — Progress Notes (Signed)
[facility]    SUBJECTIVE: Patient states he feels great no chest pain no shortness of breath.  Good appetite no weakness feels pretty much back to normal   Vitals:   01/24/20 0809 01/24/20 0845 01/24/20 0901 01/24/20 1011  BP:      Pulse: 83 85    Resp: 18 14 15 18   Temp:      TempSrc:      SpO2: 90% 95%    Weight:      Height:         Intake/Output Summary (Last 24 hours) at 01/24/2020 1020 Last data filed at 01/24/2020 03/25/2020 Gross per 24 hour  Intake 1350.1 ml  Output 1850 ml  Net -499.9 ml      PHYSICAL EXAM  General: Well developed, well nourished, in no acute distress HEENT:  Normocephalic and atramatic Neck:  No JVD.  Lungs: Clear bilaterally to auscultation and percussion. Heart: HRRR . Normal S1 and S2 without gallops or murmurs.  Abdomen: Bowel sounds are positive, abdomen soft and non-tender  Msk:  Back normal, normal gait. Normal strength and tone for age. Extremities: No clubbing, cyanosis or edema.   Neuro: Alert and oriented X 3.  Residual hemiparesis Psych:  Good affect, responds appropriately   LABS: Basic Metabolic Panel: Recent Labs    01/22/20 2206 01/22/20 2206 01/23/20 1438 01/24/20 0624  NA 139  --   --  135  K 4.2  --   --  3.8  CL 101  --   --  102  CO2 24  --   --  26  GLUCOSE 134*  --   --  201*  BUN 11  --   --  14  CREATININE 0.92   < > 0.94 0.98  CALCIUM 9.5  --   --  8.9  MG  --   --   --  1.8   < > = values in this interval not displayed.   Liver Function Tests: Recent Labs    01/22/20 2206 01/24/20 0624  AST 36 25  ALT 33 23  ALKPHOS 71 60  BILITOT 1.5* 1.0  PROT 7.8 6.2*  ALBUMIN 3.5 2.8*   No results for input(s): LIPASE, AMYLASE in the last 72 hours. CBC: Recent Labs    01/23/20 1046 01/23/20 1046 01/23/20 1438 01/24/20 0624  WBC 12.5*   < > 7.8 8.5  NEUTROABS 8.9*  --  4.9  --   HGB 15.9   < > 12.8* 12.3*  HCT 44.2   < > 37.6* 35.6*  MCV 86.5   < > 89.1 87.5  PLT 268   < > 243 231   < > =  values in this interval not displayed.   Cardiac Enzymes: No results for input(s): CKTOTAL, CKMB, CKMBINDEX, TROPONINI in the last 72 hours. BNP: Invalid input(s): POCBNP D-Dimer: No results for input(s): DDIMER in the last 72 hours. Hemoglobin A1C: Recent Labs    01/23/20 1438  HGBA1C 10.1*   Fasting Lipid Panel: Recent Labs    01/23/20 1046  CHOL 202*  HDL 48  LDLCALC 126*  TRIG 142  CHOLHDL 4.2   Thyroid Function Tests: No results for input(s): TSH, T4TOTAL, T3FREE, THYROIDAB in the last 72 hours.  Invalid input(s): FREET3 Anemia Panel: No results for input(s): VITAMINB12, FOLATE, FERRITIN, TIBC, IRON, RETICCTPCT in the last 72 hours.  CARDIAC CATHETERIZATION  Result Date: 01/23/2020  Prox LAD lesion is 50% stenosed.  Dist Cx lesion is 25% stenosed.  Dist LAD-1 lesion is 50% stenosed.  Dist LAD-2 lesion is 100% stenosed.  Conclusion STEMI late presenter atypical symptoms Cardiac cath Mildly reduced left ventricular function apical akinesis EF around 45% Cors Left main large free of disease LAD large with moderate disease proximal lesion 50% and 100% distal LAD TIMI 0 flow Circumflex large left dominant system minor irregularities RCA small nondominant no significant disease Intervention Attempted intervention of very distal LAD Ballooned with a 2.5 x15 mm trek multiple inflations Stent was not deployed Continued medical therapy with aspirin and Plavix Aggrastat for 12 to 18 hours Sheath was removed and held manually   DG Chest Port 1 View  Result Date: 01/23/2020 CLINICAL DATA:  Code STEMI EXAM: PORTABLE CHEST 1 VIEW COMPARISON:  03/09/2013 FINDINGS: Normal heart size and mediastinal contours. There is no edema, consolidation, effusion, or pneumothorax. Implantable loop recorder and defibrillator pads. IMPRESSION: No radiographic abnormality. Electronically Signed   By: Marnee Spring M.D.   On: 01/23/2020 10:56     Echo pending  TELEMETRY: Normal sinus rhythm  nonspecific T2 changes  ASSESSMENT AND PLAN:  Principal Problem:   STEMI involving left anterior descending coronary artery (HCC) Active Problems:   Chronic cerebrovascular accident (CVA)   Essential hypertension, benign   Type II diabetes mellitus with neurological manifestations, uncontrolled (HCC)   Dyslipidemia associated with type 2 diabetes mellitus (HCC)   Depression with anxiety   STEMI (ST elevation myocardial infarction) (HCC) Status post balloon angioplasty Patient was a late presenter for STEMI Elevated troponins 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  Plan Agree with initial admission follow-up EKGs troponins Status post PCI with occluded LAD currently on aspirin and Plavix Echocardiogram for assessment of left ventricular function Consider repeating an additional troponin Continue statin therapy for hyperlipidemia Agree with diabetes management and control Would recommend cardiac rehab Patient should follow-up with cardiology as an outpatient 1 to 2 weeks We will consider discharging home early possibly even later today if the patient continues to do well I will review the echo and make a determination whether it safe enough for him to be discharged today   Alwyn Pea, MD 01/24/2020 10:20 AM

## 2020-01-24 NOTE — Progress Notes (Signed)
PT Cancellation Note  Patient Details Name: [name] [name] MRN: [id number] DOB: 07/07/1970   Cancelled Treatment:    Reason Eval/Treat Not Completed: Medical issues which prohibited therapy (Consult received and chart reviewed.  Per primary RN, patient remains on aggrastat drip, to be discontinued around 1000.  Will hold until drip completed and patient appropriate for activitiy.)   Robby Bulkley H. Manson Passey, PT, DPT, NCS 01/24/20, 8:00 AM [phone]
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
## 2020-01-24 NOTE — Progress Notes (Addendum)
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   ST elevation myocardial infarction Patient presented via EMS to the ED with nausea/vomiting, shortness of breath and chest discomfort.  Troponin elevated 4125.  EKG notable for sinus tachycardia with ST elevation in leads II, III, aVF and leads V3 through V6.  Patient was initially started on heparin drip, cardiology was consulted and patient underwent cardiac catheterization on 01/23/2020 with distal lesion with balloon angioplasty without stent placement.  Cholesterol panel with total cholesterol 202, HDL 48, LDL 126.  PLAN: --completed aggrastat gtt this morning. --continue ASA and plavix --continue lipitor 80 mg daily --continue atenolol and losartan --TTE --cardiac rehab --follow up with cards as outpatient 1-2 weeks after discharge  Essential hypertension BP elevated on admission, 190/100.  Patient states he missed medicines day prior in route to ED.  But he is usually fairly compliant. PLAN: --cont home atenolol, losartan, trameterene-HCTZ, and amlodipine  Type 2 diabetes mellitus, uncontrolled Last hemoglobin A1c 12.5 on 04/12/19.  current A1c 10.    Currently on Lantus 35 units nightly, Victoza 18 mg daily. PLAN: --continue Lantus 35u daily --SSI moderate scale  Dyslipidemia --Atorvastatin 80 mg p.o. daily --Ezetimibe 10 mg p.o. daily   Depression with anxiety --Bupropion 150 mg p.o. daily, citalopram 40 mg p.o. daily  History of CVA with residual weakness Patient currently lives alone in a duplex.  He is wheelchair-bound at baseline, but states is able to accomplish ADLs independently. --cont ASA, plavix and statin --PT eval found pt at baseline, no further needs.    DVT prophylaxis: Lovenox SQ Code Status: Full code  Family Communication:  Status is: inpatient Dispo:   The patient is from: home Anticipated d/c is to: home Anticipated d/c date is: tomorrow Patient currently is not medically stable to d/c due to: post STEMI and PCI, pending TTE, need cardiology clearance for discharge.   Subjective and Interval History:  Pt stated that the main symptom that brought him to the ED was nausea, which has resolved, and eating now.  Pt said he felt better now than he had been for a while.  No chest pain.    Objective: Vitals:   01/24/20 1349 01/24/20 1519 01/24/20 1542 01/24/20 1807  BP:   (!) 151/99 (!) 158/98  Pulse:   92   Resp: 17 17 13 17   Temp:   97.9 F (36.6 C)   TempSrc:      SpO2:   98% 95%  Weight:      Height:        Intake/Output Summary (Last 24 hours) at 01/24/2020 1852 Last data filed at 01/24/2020 1844 Gross per 24 hour  Intake 1600.1 ml  Output 2375 ml  Net -774.9 ml   03/25/2020  01/22/20 2203  Weight: 83.9 kg    Examination:   Constitutional: NAD, AAOx3, sitting up HEENT: conjunctivae and lids normal, EOMI CV: RRR no M,R,G. Distal pulses +2.  No cyanosis.   RESP: CTA B/L, normal respiratory effort  GI: +BS, NTND Extremities: No effusions, edema, or tenderness in BLE SKIN: warm, dry.  Scratch marks from cats. Neuro: II - XII grossly intact.  Sensation intact Psych: Normal  mood and affect.  Appropriate judgement and reason   Data Reviewed: I have personally reviewed following labs and imaging studies  CBC: Recent Labs  Lab 01/22/20 2206 01/23/20 1046 01/23/20 1438 01/24/20 0624  WBC 10.8* 12.5* 7.8 8.5  NEUTROABS  --  8.9* 4.9  --   HGB 14.9 15.9 12.8* 12.3*  HCT 40.8 44.2 37.6* 35.6*  MCV 85.2 86.5 89.1 87.5  PLT 257 268 243 231   Basic Metabolic Panel: Recent Labs  Lab 01/22/20 2206 01/23/20 1438 01/24/20 0624  NA 139  --  135  K 4.2  --  3.8  CL 101  --  102  CO2 24  --  26  GLUCOSE 134*  --  201*  BUN 11  --  14  CREATININE 0.92 0.94 0.98  CALCIUM 9.5  --  8.9  MG  --   --  1.8   GFR: Estimated Creatinine Clearance: 93.2 mL/min (by C-G formula based on SCr of 0.98 mg/dL). Liver Function Tests: Recent Labs  Lab 01/22/20 2206 01/24/20 0624  AST 36 25  ALT 33 23  ALKPHOS 71 60  BILITOT 1.5* 1.0  PROT 7.8 6.2*  ALBUMIN 3.5 2.8*   No results for input(s): LIPASE, AMYLASE in the last 168 hours. No results for input(s): AMMONIA in the last 168 hours. Coagulation Profile: Recent Labs  Lab 01/23/20 1046  INR 1.0   Cardiac Enzymes: No results for input(s): CKTOTAL, CKMB, CKMBINDEX, TROPONINI in the last 168 hours. BNP (last 3 results) No results for input(s): PROBNP in the last 8760 hours. HbA1C: Recent Labs    01/23/20 1046 01/23/20 1438  HGBA1C 10.0* 10.1*   CBG: Recent Labs  Lab 01/23/20 1605 01/23/20 1929 01/23/20 2133 01/24/20 0759 01/24/20 1158  GLUCAP 204* 309* 268* 181* 270*   Lipid Profile: Recent Labs    01/23/20 1046  CHOL 202*  HDL 48  LDLCALC 126*  TRIG 142  CHOLHDL 4.2   Thyroid Function Tests: No results for input(s): TSH, T4TOTAL, FREET4, T3FREE, THYROIDAB in the last 72 hours. Anemia Panel: No results for input(s): VITAMINB12, FOLATE, FERRITIN, TIBC, IRON, RETICCTPCT in the last 72 hours. Sepsis Labs: No results for input(s): PROCALCITON, LATICACIDVEN in the last 168  hours.  Recent Results (from the past 240 hour(s))  Respiratory Panel by RT PCR (Flu A&B, Covid) - Nasopharyngeal Swab     Status: None   Collection Time: 01/22/20 10:06 PM   Specimen: Nasopharyngeal Swab  Result Value Ref Range Status   SARS Coronavirus 2 by RT PCR NEGATIVE NEGATIVE Final    Comment: (NOTE) SARS-CoV-2 target nucleic acids are NOT DETECTED.  The SARS-CoV-2 RNA is generally detectable in upper respiratoy specimens during the acute phase of infection. The lowest concentration of SARS-CoV-2 viral copies this assay can detect is 131 copies/mL. A negative result does not preclude SARS-Cov-2 infection and should not be used as the sole basis for treatment or other patient management decisions. A negative result may occur with  improper specimen collection/handling, submission of specimen other than nasopharyngeal swab, presence of viral mutation(s)  within the areas targeted by this assay, and inadequate number of viral copies (<131 copies/mL). A negative result must be combined with clinical observations, patient history, and epidemiological information. The expected result is Negative.  Fact Sheet for Patients:  https://www.moore.com/  Fact Sheet for Healthcare Providers:  https://www.young.biz/  This test is no t yet approved or cleared by the [place] FDA and  has been authorized for detection and/or diagnosis of SARS-CoV-2 by FDA under an Emergency Use Authorization (EUA). This EUA will remain  in effect (meaning this test can be used) for the duration of the COVID-19 declaration under Section 564(b)(1) of the Act, 21 U.S.C. section 360bbb-3(b)(1), unless the authorization is terminated or revoked sooner.     Influenza A by PCR NEGATIVE NEGATIVE Final   Influenza B by PCR NEGATIVE NEGATIVE Final    Comment: (NOTE) The Xpert Xpress SARS-CoV-2/FLU/RSV assay is intended as an aid in  the diagnosis of influenza from  Nasopharyngeal swab specimens and  should not be used as a sole basis for treatment. Nasal washings and  aspirates are unacceptable for Xpert Xpress SARS-CoV-2/FLU/RSV  testing.  Fact Sheet for Patients: https://www.moore.com/  Fact Sheet for Healthcare Providers: https://www.young.biz/  This test is not yet approved or cleared by the [place] FDA and  has been authorized for detection and/or diagnosis of SARS-CoV-2 by  FDA under an Emergency Use Authorization (EUA). This EUA will remain  in effect (meaning this test can be used) for the duration of the  Covid-19 declaration under Section 564(b)(1) of the Act, 21  U.S.C. section 360bbb-3(b)(1), unless the authorization is  terminated or revoked. Performed at [facility], [address] [place], [place] [address]       Radiology Studies: CARDIAC CATHETERIZATION  Result Date: 01/23/2020  Prox LAD lesion is 50% stenosed.  Dist Cx lesion is 25% stenosed.  Dist LAD-1 lesion is 50% stenosed.  Dist LAD-2 lesion is 100% stenosed.  Conclusion STEMI late presenter atypical symptoms Cardiac cath Mildly reduced left ventricular function apical akinesis EF around 45% Cors Left main large free of disease LAD large with moderate disease proximal lesion 50% and 100% distal LAD TIMI 0 flow Circumflex large left dominant system minor irregularities RCA small nondominant no significant disease Intervention Attempted intervention of very distal LAD Ballooned with a 2.5 x15 mm trek multiple inflations Stent was not deployed Continued medical therapy with aspirin and Plavix Aggrastat for 12 to 18 hours Sheath was removed and held manually   DG Chest Port 1 View  Result Date: 01/23/2020 CLINICAL DATA:  Code STEMI EXAM: PORTABLE CHEST 1 VIEW COMPARISON:  03/09/2013 FINDINGS: Normal heart size and mediastinal contours. There is no edema, consolidation, effusion, or pneumothorax. Implantable loop  recorder and defibrillator pads. IMPRESSION: No radiographic abnormality. Electronically Signed   By: Marnee Spring M.D.   On: 01/23/2020 10:56     Scheduled Meds: . amLODipine  5 mg Oral Daily  . aspirin  81 mg Oral Daily  . atenolol  50 mg Oral BID  . atorvastatin  80 mg Oral Daily  . buPROPion  150 mg Oral Daily  . citalopram  40 mg Oral Daily  . clopidogrel  75 mg Oral Q breakfast  . enoxaparin (LOVENOX) injection  40 mg Subcutaneous Q24H  . folic acid  1 mg Oral Daily  . insulin aspart      . insulin aspart      . insulin aspart  0-15 Units Subcutaneous TID WC  . insulin  aspart  0-5 Units Subcutaneous QHS  . insulin glargine  35 Units Subcutaneous QHS  . losartan  100 mg Oral QPM  . sodium chloride flush  3 mL Intravenous Q12H  . tamsulosin  0.4 mg Oral QPC supper  . triamterene-hydrochlorothiazide  1 capsule Oral Daily   Continuous Infusions:   LOS: 1 day     Darlin Priestly, MD [facility] If 7PM-7AM, please contact night-coverage 01/24/2020, 6:52 PM
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
## 2020-01-25 DIAGNOSIS — I2102 ST elevation (STEMI) myocardial infarction involving left anterior descending coronary artery: Secondary | ICD-10-CM | POA: Diagnosis not present

## 2020-01-25 LAB — BASIC METABOLIC PANEL
Anion gap: 9 (ref 5–15)
BUN: 14 mg/dL (ref 6–20)
CO2: 28 mmol/L (ref 22–32)
Calcium: 9 mg/dL (ref 8.9–10.3)
Chloride: 99 mmol/L (ref 98–111)
Creatinine, Ser: 1.05 mg/dL (ref 0.61–1.24)
GFR calc Af Amer: >60 mL/min (ref >60)
GFR calc non Af Amer: >60 mL/min (ref >60)
Glucose, Bld: 217 mg/dL — ABNORMAL HIGH (ref 70–99)
Potassium: 3.4 mmol/L — ABNORMAL LOW (ref 3.5–5.1)
Sodium: 136 mmol/L (ref 135–145)

## 2020-01-25 LAB — CBC
HCT: 37.7 % — ABNORMAL LOW (ref 39.0–52.0)
Hemoglobin: 13.1 g/dL (ref 13.0–17.0)
MCH: 30.8 pg (ref 26.0–34.0)
MCHC: 34.7 g/dL (ref 30.0–36.0)
MCV: 88.5 fL (ref 80.0–100.0)
Platelets: 225 10*3/uL (ref 150–400)
RBC: 4.26 MIL/uL (ref 4.22–5.81)
RDW: 11.5 % (ref 11.5–15.5)
WBC: 8.4 10*3/uL (ref 4.0–10.5)
nRBC: 0 % (ref 0.0–0.2)

## 2020-01-25 LAB — MAGNESIUM: Magnesium: 1.8 mg/dL (ref 1.7–2.4)

## 2020-01-25 LAB — ECHOCARDIOGRAM COMPLETE
AR max vel: 2.12 cm2
AV Area VTI: 2.12 cm2
AV Area mean vel: 1.93 cm2
AV Mean grad: 4 mmHg
AV Peak grad: 6.4 mmHg
Ao pk vel: 1.26 m/s
Area-P 1/2: 4.49 cm2
S' Lateral: 2.12 cm
Single Plane A4C EF: 69.1 %

## 2020-01-25 LAB — GLUCOSE, CAPILLARY
Glucose-Capillary: 214 mg/dL — ABNORMAL HIGH (ref 70–99)
Glucose-Capillary: 284 mg/dL — ABNORMAL HIGH (ref 70–99)

## 2020-01-25 MED ORDER — AMLODIPINE BESYLATE 5 MG PO TABS: 5.0000 mg | ORAL_TABLET | Freq: Every day | ORAL | 2 refills | Status: DC

## 2020-01-25 MED ORDER — APIXABAN 5 MG PO TABS: 5.0000 mg | ORAL_TABLET | Freq: Two times a day (BID) | ORAL | Status: DC

## 2020-01-25 MED ORDER — APIXABAN (ELIQUIS) VTE STARTER PACK (10MG AND 5MG): ORAL_TABLET | ORAL | 0 refills | Status: DC

## 2020-01-25 MED ORDER — INSULIN ASPART 100 UNIT/ML ~~LOC~~ SOLN: 5.0000 [IU] | Freq: Three times a day (TID) | SUBCUTANEOUS | 2 refills | Status: AC

## 2020-01-25 MED ORDER — CLOPIDOGREL BISULFATE 75 MG PO TABS: 75.0000 mg | ORAL_TABLET | Freq: Every day | ORAL | 5 refills | Status: AC

## 2020-01-25 MED ORDER — CLOPIDOGREL BISULFATE 75 MG PO TABS
75.0000 mg | ORAL_TABLET | Freq: Every day | ORAL | 5 refills | Status: DC
Start: 2020-01-25 — End: 2020-01-25

## 2020-01-25 MED ORDER — POTASSIUM CHLORIDE CRYS ER 20 MEQ PO TBCR
40.0000 meq | EXTENDED_RELEASE_TABLET | Freq: Once | ORAL | Status: AC
  Administered 2020-01-25: 40 meq via ORAL

## 2020-01-25 MED ORDER — APIXABAN 5 MG PO TABS
10.0000 mg | ORAL_TABLET | Freq: Two times a day (BID) | ORAL | Status: DC
  Administered 2020-01-25: 10 mg via ORAL
  Filled 2020-01-25 (×3): qty 2

## 2020-01-25 MED ORDER — INSULIN ASPART 100 UNIT/ML ~~LOC~~ SOLN: 5.0000 [IU] | Freq: Three times a day (TID) | SUBCUTANEOUS | 2 refills | Status: DC

## 2020-01-25 NOTE — Progress Notes (Signed)
[facility] Cardiology    SUBJECTIVE: Patient feels well no chest pain no shortness of breath feels well enough to go home blood pressure is much improved   Vitals:   01/25/20 0626 01/25/20 0650 01/25/20 0800 01/25/20 0915  BP: (!) 148/97     Pulse: 80     Resp: 15 19 12 20   Temp: 97.8 F (36.6 C)     TempSrc:      SpO2: 97%     Weight:      Height:         Intake/Output Summary (Last 24 hours) at 01/25/2020 0949 Last data filed at 01/25/2020 03/26/2020 Gross per 24 hour  Intake 670 ml  Output 2250 ml  Net -1580 ml      PHYSICAL EXAM  General: Well developed, well nourished, in no acute distress HEENT:  Normocephalic and atramatic Neck:  No JVD.  Lungs: Clear bilaterally to auscultation and percussion. Heart: HRRR . Normal S1 and S2 without gallops or murmurs.  Abdomen: Bowel sounds are positive, abdomen soft and non-tender  Msk:  Back normal, normal gait. Normal strength and tone for age. Extremities: No clubbing, cyanosis or edema.   Neuro: Alert and oriented X 3.  Stable residual hemiparesis Psych:  Good affect, responds appropriately   LABS: Basic Metabolic Panel: Recent Labs    01/24/20 0624 01/25/20 0621  NA 135 136  K 3.8 3.4*  CL 102 99  CO2 26 28  GLUCOSE 201* 217*  BUN 14 14  CREATININE 0.98 1.05  CALCIUM 8.9 9.0  MG 1.8 1.8   Liver Function Tests: Recent Labs    01/22/20 2206 01/24/20 0624  AST 36 25  ALT 33 23  ALKPHOS 71 60  BILITOT 1.5* 1.0  PROT 7.8 6.2*  ALBUMIN 3.5 2.8*   No results for input(s): LIPASE, AMYLASE in the last 72 hours. CBC: Recent Labs    01/23/20 1046 01/23/20 1046 01/23/20 1438 01/23/20 1438 01/24/20 0624 01/25/20 0621  WBC 12.5*   < > 7.8   < > 8.5 8.4  NEUTROABS 8.9*  --  4.9  --   --   --   HGB 15.9   < > 12.8*   < > 12.3* 13.1  HCT 44.2   < > 37.6*   < > 35.6* 37.7*  MCV 86.5   < > 89.1   < > 87.5 88.5  PLT 268   < > 243   < > 231 225   < > = values in this interval not displayed.   Cardiac Enzymes: No  results for input(s): CKTOTAL, CKMB, CKMBINDEX, TROPONINI in the last 72 hours. BNP: Invalid input(s): POCBNP D-Dimer: No results for input(s): DDIMER in the last 72 hours. Hemoglobin A1C: Recent Labs    01/23/20 1438  HGBA1C 10.1*   Fasting Lipid Panel: Recent Labs    01/23/20 1046  CHOL 202*  HDL 48  LDLCALC 126*  TRIG 142  CHOLHDL 4.2   Thyroid Function Tests: No results for input(s): TSH, T4TOTAL, T3FREE, THYROIDAB in the last 72 hours.  Invalid input(s): FREET3 Anemia Panel: No results for input(s): VITAMINB12, FOLATE, FERRITIN, TIBC, IRON, RETICCTPCT in the last 72 hours.  CARDIAC CATHETERIZATION  Result Date: 01/23/2020  Prox LAD lesion is 50% stenosed.  Dist Cx lesion is 25% stenosed.  Dist LAD-1 lesion is 50% stenosed.  Dist LAD-2 lesion is 100% stenosed.  Conclusion STEMI late presenter atypical symptoms Cardiac cath Mildly reduced left ventricular function apical akinesis EF around 45%  Cors Left main large free of disease LAD large with moderate disease proximal lesion 50% and 100% distal LAD TIMI 0 flow Circumflex large left dominant system minor irregularities RCA small nondominant no significant disease Intervention Attempted intervention of very distal LAD Ballooned with a 2.5 x15 mm trek multiple inflations Stent was not deployed Continued medical therapy with aspirin and Plavix Aggrastat for 12 to 18 hours Sheath was removed and held manually   DG Chest Port 1 View  Result Date: 01/23/2020 CLINICAL DATA:  Code STEMI EXAM: PORTABLE CHEST 1 VIEW COMPARISON:  03/09/2013 FINDINGS: Normal heart size and mediastinal contours. There is no edema, consolidation, effusion, or pneumothorax. Implantable loop recorder and defibrillator pads. IMPRESSION: No radiographic abnormality. Electronically Signed   By: Marnee Spring M.D.   On: 01/23/2020 10:56     Echo preserved left ventricular function EF around 60% with apical mural thrombus  TELEMETRY: Normal sinus rhythm  nonspecific T2 changes:  ASSESSMENT AND PLAN:  Principal Problem:   STEMI involving left anterior descending coronary artery (HCC) Active Problems:   Chronic cerebrovascular accident (CVA)   Essential hypertension, benign   Type II diabetes mellitus with neurological manifestations, uncontrolled (HCC)   Dyslipidemia associated with type 2 diabetes mellitus (HCC)   Depression with anxiety   STEMI (ST elevation myocardial infarction) (HCC) LV mural thrombus at the apex  Plan Agree hopefully with discharge today Eliquis 5 mg twice a day for 3 to 6 months Continue Plavix 75 mg a day for 6 months We will hold aspirin for now while still on Eliquis Continue strict blood pressure control Amlodipine 5 mg once a day atenolol 50 mg twice daily Lipitor 80 mg once a day bupropion 150 citalopram 40 Plavix 75 Zetia 10 Cozaar 100 Diabetes medications Refer the patient to cardiac rehab Outpatient follow-up with cardiology 1 to 2 weeks Stable enough for discharge today  Alwyn Pea, MD 01/25/2020 9:49 AM

## 2020-01-25 NOTE — Discharge Summary (Addendum)
Physician Discharge Summary   [name] [name]  male DOB: 12/01/1970  EAV:[id number]  PCP: [facility]  Admit date: 01/23/2020 Discharge date: 01/25/2020  Admitted From: home Disposition:  home CODE STATUS: Full code  Discharge Instructions    AMB Referral to Cardiac Rehabilitation - Phase II   Complete by: As directed    Diagnosis: STEMI   After initial evaluation and assessments completed: Virtual Based Care may be provided alone or in conjunction with Phase 2 Cardiac Rehab based on patient barriers.: Yes   Discharge instructions   Complete by: As directed    Dr. Juliann Pares has treated your heart attack.  He wants you to take Plavix 75 mg daily for 6 months.  You also have blood clots inside your heart, so Dr. Juliann Pares wants you to take blood thinner Eliquis for at least 3 months.  Printed prescriptions for both are provided.    Please follow up with Dr. Juliann Pares 1-2 weeks after discharge.  Your diabetes is not well controlled.  In addition to your home Lantus 35 units nightly, I am adding short-acting insulin 5 units with each meal.  Please follow up with your primary care doctor for better control of your diabetes.   Dr. Darlin Priestly - -   Increase activity slowly   Complete by: As directed    No wound care   Complete by: As directed        Hospital Course:  For full details, please see H&P, progress notes, consult notes and ancillary notes.  Briefly,  [name] [name] medical history significant ofessential hypertension, type 2 diabetes mellitus, history of CVA, sick sinus syndrome status postLinq device,tobacco use disorder who presented to ED with shortness of breath, nausea, abdominal pain.   ST elevation myocardial infarction Troponin elevated 4125. EKG notable for sinus tachycardia with ST elevation in leads II, III, aVF and leads V3 through V6. Patient was initially started on heparin drip, cardiology was consulted  and patient underwent cardiac catheterization on 01/23/2020 with distal lesion with balloon angioplasty without stent placement. Cholesterol panel with total cholesterol 202, HDL 48, LDL 126.  Pt completed aggrastat gtt.  Pt was discharged on Plavix for 6 months and Lipitor 80 mg daily.  ASA 81 was held because pt was started on Eliquis for LV thrombus.   Continued atenolol and losartan.  Pt referred to cardiac rehab.  Pt will follow up with cards Dr. Juliann Pares as outpatient 1-2 weeks after discharge.  LV mural thrombus at the apex Echo showed preserved left ventricular function EF around 60% with apical mural thrombus.  Pt was started on Eliquis and will follow up with cardiology as outpatient.  Essential hypertension BP elevated on admission, 190/100. Patient stated he missed medicines day prior in route to ED. But he is usually fairly compliant.  cont home atenolol, losartan, trameterene-HCTZ, and amlodipine.  Type 2 diabetes mellitus, uncontrolled Last hemoglobin A1c 12.5on 04/12/19.current A1c 10.   Currently on Lantus 35 units nightly, Victoza 18 mg daily.  Meal-time insulin 5u added with each meal.    Dyslipidemia Atorvastatin 80 mg p.o. daily Ezetimibe 10 mg p.o. daily   Depression with anxiety Bupropion 150 mg p.o. daily, citalopram 40 mg p.o. daily  History of CVA with residual weakness Patient currently lives alone in a duplex. He is wheelchair-bound at baseline, but states is able to accomplish ADLs independently. Continued plavix and statin.  ASA on hold while pt is on Eliquis.  PT  eval found pt at baseline, no further needs.   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) Active Problems:   Essential hypertension, benign   Type II diabetes mellitus with neurological manifestations, uncontrolled (HCC)   Dyslipidemia associated with type 2 diabetes mellitus (HCC)   Depression with anxiety   STEMI (ST  elevation myocardial infarction) [facility])    Discharge Instructions:  Allergies as of 01/25/2020      Reactions   Penicillins Other (See Comments), Anaphylaxis   Has patient had a PCN reaction causing immediate rash, facial/tongue/throat swelling, SOB or lightheadedness with hypotension: Yes Has patient had a PCN reaction causing severe rash involving mucus membranes or skin necrosis: No Has patient had a PCN reaction that required hospitalization: No Has patient had a PCN reaction occurring within the last 10 years: No If all of the above answers are "NO", then may proceed with Cephalosporin use.      Medication List    STOP taking these medications   Calcium/Vitamin D 500-200 MG-UNIT Tabs   chlorthalidone 50 MG tablet Commonly known as: HYGROTON   traMADol 50 MG tablet Commonly known as: ULTRAM     TAKE these medications   amLODipine 5 MG tablet Commonly known as: NORVASC Take 1 tablet (5 mg total) by mouth daily. New blood pressure medication.   Apixaban Starter Pack (10mg  and 5mg ) Commonly known as: ELIQUIS STARTER PACK Take as directed on package: start with two-5mg  tablets twice daily for 7 days. On day 8, switch to one-5mg  tablet twice daily.   atenolol 50 MG tablet Commonly known as: TENORMIN Take 50 mg by mouth 2 (two) times daily.   atorvastatin 80 MG tablet Commonly known as: LIPITOR Take 80 mg by mouth daily.   buPROPion 150 MG 24 hr tablet Commonly known as: WELLBUTRIN XL Take 150 mg by mouth daily.   citalopram 40 MG tablet Commonly known as: CELEXA Take 40 mg by mouth daily.   clopidogrel 75 MG tablet Commonly known as: PLAVIX Take 1 tablet (75 mg total) by mouth daily.   ezetimibe 10 MG tablet Commonly known as: ZETIA Take 10 mg by mouth daily.   folic acid 1 MG tablet Commonly known as: FOLVITE Take 1 mg by mouth daily.   insulin aspart 100 UNIT/ML injection Commonly known as: NovoLOG Inject 5 Units into the skin 3 (three) times daily  with meals. What changed:   how much to take  when to take this  reasons to take this   insulin glargine 100 UNIT/ML injection Commonly known as: LANTUS Inject 35 Units into the skin at bedtime.   liraglutide 18 MG/3ML Sopn Commonly known as: VICTOZA Inject into the skin daily.   losartan 100 MG tablet Commonly known as: COZAAR Take 100 mg by mouth every evening.   Multivitamin Adult Tabs Take 1 tablet by mouth daily.   Centrum Flavor Burst Adult Chew Chew 1 each by mouth daily.   tamsulosin 0.4 MG Caps capsule Commonly known as: FLOMAX Take 0.4 mg by mouth 2 (two) times daily.   triamterene-hydrochlorothiazide 37.5-25 MG capsule Commonly known as: DYAZIDE Take 1 capsule by mouth daily.        Follow-up Information    Callwood, Dwayne D, MD Follow up in 2 week(s).   Specialties: Cardiology, Internal Medicine Contact information: [address] [place] [address] [phone]        [facility]. Schedule an appointment as soon as possible  for a visit in 1 week(s).   Specialty: General Practice Contact information: [address] [place] [address] [phone]               Allergies  Allergen Reactions   Penicillins Other (See Comments) and Anaphylaxis    Has patient had a PCN reaction causing immediate rash, facial/tongue/throat swelling, SOB or lightheadedness with hypotension: Yes Has patient had a PCN reaction causing severe rash involving mucus membranes or skin necrosis: No Has patient had a PCN reaction that required hospitalization: No Has patient had a PCN reaction occurring within the last 10 years: No If all of the above answers are "NO", then may proceed with Cephalosporin use.      The results of significant diagnostics from this hospitalization (including imaging, microbiology, ancillary and laboratory) are listed below for reference.   Consultations:   Procedures/Studies: CARDIAC  CATHETERIZATION  Result Date: 01/23/2020  Prox LAD lesion is 50% stenosed.  Dist Cx lesion is 25% stenosed.  Dist LAD-1 lesion is 50% stenosed.  Dist LAD-2 lesion is 100% stenosed.  Conclusion STEMI late presenter atypical symptoms Cardiac cath Mildly reduced left ventricular function apical akinesis EF around 45% Cors Left main large free of disease LAD large with moderate disease proximal lesion 50% and 100% distal LAD TIMI 0 flow Circumflex large left dominant system minor irregularities RCA small nondominant no significant disease Intervention Attempted intervention of very distal LAD Ballooned with a 2.5 x15 mm trek multiple inflations Stent was not deployed Continued medical therapy with aspirin and Plavix Aggrastat for 12 to 18 hours Sheath was removed and held manually   DG Chest Port 1 View  Result Date: 01/23/2020 CLINICAL DATA:  Code STEMI EXAM: PORTABLE CHEST 1 VIEW COMPARISON:  03/09/2013 FINDINGS: Normal heart size and mediastinal contours. There is no edema, consolidation, effusion, or pneumothorax. Implantable loop recorder and defibrillator pads. IMPRESSION: No radiographic abnormality. Electronically Signed   By: Marnee Spring M.D.   On: 01/23/2020 10:56   ECHOCARDIOGRAM COMPLETE  Result Date: 01/25/2020    ECHOCARDIOGRAM REPORT   Patient Name:   [name] [name] Date of Exam: 01/24/2020 Medical Rec #:  [id number]      Height:       69.0 in Accession #:    [id number]     Weight:       185.0 lb Date of Birth:  [birth year]       BSA:          1.999 m Patient Age:    49 years       BP:           149/103 mmHg Patient Gender: M              HR:           74 bpm. Exam Location:  [facility] Procedure: 2D Echo and Intracardiac Opacification Agent Indications:     Acute Myocardial Infarction  History:         Patient has prior history of Echocardiogram examinations.                  Stroke; Risk Factors:Diabetes and Hypertension.  Sonographer:     L Thornton-Maynard Referring Phys:  [id number] DWAYNE D  CALLWOOD Diagnosing Phys: Alwyn Pea MD IMPRESSIONS  1. Apical Akinesis.  2. Apical LV mural Thrombus.  3. Left ventricular ejection fraction, by estimation, is 65 to 70%. The left ventricle has normal function. The left ventricle demonstrates regional wall motion abnormalities (see  scoring diagram/findings for description). There is mild concentric left ventricular hypertrophy. Left ventricular diastolic parameters were normal.  4. Right ventricular systolic function is normal. The right ventricular size is normal. There is normal pulmonary artery systolic pressure.  5. The mitral valve is normal in structure. No evidence of mitral valve regurgitation.  6. The aortic valve is normal in structure. Aortic valve regurgitation is not visualized. FINDINGS  Left Ventricle: Left ventricular ejection fraction, by estimation, is 65 to 70%. The left ventricle has normal function. The left ventricle demonstrates regional wall motion abnormalities. Definity contrast agent was given IV to delineate the left ventricular endocardial borders. The left ventricular internal cavity size was normal in size. There is mild concentric left ventricular hypertrophy. Left ventricular diastolic parameters were normal.  LV Wall Scoring: The basal anteroseptal segment and apex are akinetic. Right Ventricle: The right ventricular size is normal. No increase in right ventricular wall thickness. Right ventricular systolic function is normal. There is normal pulmonary artery systolic pressure. The tricuspid regurgitant velocity is 1.75 m/s, and  with an assumed right atrial pressure of 3 mmHg, the estimated right ventricular systolic pressure is 15.2 mmHg. Left Atrium: Left atrial size was normal in size. Right Atrium: Right atrial size was normal in size. Pericardium: There is no evidence of pericardial effusion. Mitral Valve: The mitral valve is normal in structure. No evidence of mitral valve regurgitation. Tricuspid Valve: The tricuspid  valve is normal in structure. Tricuspid valve regurgitation is not demonstrated. Aortic Valve: The aortic valve is normal in structure. Aortic valve regurgitation is not visualized. Aortic valve mean gradient measures 4.0 mmHg. Aortic valve peak gradient measures 6.4 mmHg. Aortic valve area, by VTI measures 2.12 cm. Pulmonic Valve: The pulmonic valve was normal in structure. Pulmonic valve regurgitation is not visualized. Aorta: Aortic root could not be assessed. IAS/Shunts: No atrial level shunt detected by color flow Doppler. Additional Comments: Apical Akinesis. Apical LV mural Thrombus.  LEFT VENTRICLE PLAX 2D LVIDd:         3.82 cm     Diastology LVIDs:         2.12 cm     LV e' medial:    4.68 cm/s LV PW:         1.43 cm     LV E/e' medial:  20.3 LV IVS:        1.50 cm     LV e' lateral:   3.81 cm/s LVOT diam:     1.90 cm     LV E/e' lateral: 25.0 LV SV:         46 LV SV Index:   23 LVOT Area:     2.84 cm  LV Volumes (MOD) LV vol d, MOD A2C: 70.5 ml LV vol d, MOD A4C: 87.6 ml LV vol s, MOD A4C: 27.1 ml LV SV MOD A4C:     87.6 ml RIGHT VENTRICLE RV S prime:     14.10 cm/s LEFT ATRIUM             Index LA diam:        3.70 cm 1.85 cm/m LA Vol (A2C):   42.0 ml 21.01 ml/m LA Vol (A4C):   45.7 ml 22.87 ml/m LA Biplane Vol: 44.1 ml 22.06 ml/m  AORTIC VALVE                   PULMONIC VALVE AV Area (Vmax):    2.12 cm    PV Vmax:  1.05 m/s AV Area (Vmean):   1.93 cm    PV Peak grad:  4.4 mmHg AV Area (VTI):     2.12 cm AV Vmax:           126.00 cm/s AV Vmean:          95.400 cm/s AV VTI:            0.215 m AV Peak Grad:      6.4 mmHg AV Mean Grad:      4.0 mmHg LVOT Vmax:         94.00 cm/s LVOT Vmean:        64.800 cm/s LVOT VTI:          0.161 m LVOT/AV VTI ratio: 0.75  AORTA Ao Root diam: 3.60 cm MITRAL VALVE               TRICUSPID VALVE MV Area (PHT): 4.49 cm    TR Peak grad:   12.2 mmHg MV Decel Time: 169 msec    TR Vmax:        175.00 cm/s MV E velocity: 95.10 cm/s MV A velocity: 82.70 cm/s   SHUNTS MV E/A ratio:  1.15        Systemic VTI:  0.16 m                            Systemic Diam: 1.90 cm Dwayne D Callwood MD Electronically signed by Alwyn Pea MD Signature Date/Time: 01/25/2020/11:59:02 AM    Final       Labs: BNP (last 3 results) No results for input(s): BNP in the last 8760 hours. Basic Metabolic Panel: Recent Labs  Lab 01/22/20 2206 01/23/20 1438 01/24/20 0624 01/25/20 0621  NA 139  --  135 136  K 4.2  --  3.8 3.4*  CL 101  --  102 99  CO2 24  --  26 28  GLUCOSE 134*  --  201* 217*  BUN 11  --  14 14  CREATININE 0.92 0.94 0.98 1.05  CALCIUM 9.5  --  8.9 9.0  MG  --   --  1.8 1.8   Liver Function Tests: Recent Labs  Lab 01/22/20 2206 01/24/20 0624  AST 36 25  ALT 33 23  ALKPHOS 71 60  BILITOT 1.5* 1.0  PROT 7.8 6.2*  ALBUMIN 3.5 2.8*   No results for input(s): LIPASE, AMYLASE in the last 168 hours. No results for input(s): AMMONIA in the last 168 hours. CBC: Recent Labs  Lab 01/22/20 2206 01/23/20 1046 01/23/20 1438 01/24/20 0624 01/25/20 0621  WBC 10.8* 12.5* 7.8 8.5 8.4  NEUTROABS  --  8.9* 4.9  --   --   HGB 14.9 15.9 12.8* 12.3* 13.1  HCT 40.8 44.2 37.6* 35.6* 37.7*  MCV 85.2 86.5 89.1 87.5 88.5  PLT 257 268 243 231 225   Cardiac Enzymes: No results for input(s): CKTOTAL, CKMB, CKMBINDEX, TROPONINI in the last 168 hours. BNP: Invalid input(s): POCBNP CBG: Recent Labs  Lab 01/24/20 1158 01/24/20 1950 01/24/20 2152 01/25/20 0835 01/25/20 1156  GLUCAP 270* 217* 256* 214* 284*   D-Dimer No results for input(s): DDIMER in the last 72 hours. Hgb A1c Recent Labs    01/23/20 1046 01/23/20 1438  HGBA1C 10.0* 10.1*   Lipid Profile Recent Labs    01/23/20 1046  CHOL 202*  HDL 48  LDLCALC 126*  TRIG 142  CHOLHDL 4.2   Thyroid function  studies No results for input(s): TSH, T4TOTAL, T3FREE, THYROIDAB in the last 72 hours.  Invalid input(s): FREET3 Anemia work up No results for input(s): VITAMINB12, FOLATE,  FERRITIN, TIBC, IRON, RETICCTPCT in the last 72 hours. Urinalysis    Component Value Date/Time   COLORURINE YELLOW 12/25/2016 1949   APPEARANCEUR CLEAR 12/25/2016 1949   LABSPEC 1.026 12/25/2016 1949   PHURINE 6.0 12/25/2016 1949   GLUCOSEU >=500 (A) 12/25/2016 1949   HGBUR NEGATIVE 12/25/2016 1949   BILIRUBINUR NEGATIVE 12/25/2016 1949   KETONESUR NEGATIVE 12/25/2016 1949   PROTEINUR >=300 (A) 12/25/2016 1949   NITRITE NEGATIVE 12/25/2016 1949   LEUKOCYTESUR NEGATIVE 12/25/2016 1949   Sepsis Labs Invalid input(s): PROCALCITONIN,  WBC,  LACTICIDVEN Microbiology Recent Results (from the past 240 hour(s))  Respiratory Panel by RT PCR (Flu A&B, Covid) - Nasopharyngeal Swab     Status: None   Collection Time: 01/22/20 10:06 PM   Specimen: Nasopharyngeal Swab  Result Value Ref Range Status   SARS Coronavirus 2 by RT PCR NEGATIVE NEGATIVE Final    Comment: (NOTE) SARS-CoV-2 target nucleic acids are NOT DETECTED.  The SARS-CoV-2 RNA is generally detectable in upper respiratoy specimens during the acute phase of infection. The lowest concentration of SARS-CoV-2 viral copies this assay can detect is 131 copies/mL. A negative result does not preclude SARS-Cov-2 infection and should not be used as the sole basis for treatment or other patient management decisions. A negative result may occur with  improper specimen collection/handling, submission of specimen other than nasopharyngeal swab, presence of viral mutation(s) within the areas targeted by this assay, and inadequate number of viral copies (<131 copies/mL). A negative result must be combined with clinical observations, patient history, and epidemiological information. The expected result is Negative.  Fact Sheet for Patients:  https://www.moore.com/  Fact Sheet for Healthcare Providers:  https://www.young.biz/  This test is no t yet approved or cleared by the [place] FDA and  has  been authorized for detection and/or diagnosis of SARS-CoV-2 by FDA under an Emergency Use Authorization (EUA). This EUA will remain  in effect (meaning this test can be used) for the duration of the COVID-19 declaration under Section 564(b)(1) of the Act, 21 U.S.C. section 360bbb-3(b)(1), unless the authorization is terminated or revoked sooner.     Influenza A by PCR NEGATIVE NEGATIVE Final   Influenza B by PCR NEGATIVE NEGATIVE Final    Comment: (NOTE) The Xpert Xpress SARS-CoV-2/FLU/RSV assay is intended as an aid in  the diagnosis of influenza from Nasopharyngeal swab specimens and  should not be used as a sole basis for treatment. Nasal washings and  aspirates are unacceptable for Xpert Xpress SARS-CoV-2/FLU/RSV  testing.  Fact Sheet for Patients: https://www.moore.com/  Fact Sheet for Healthcare Providers: https://www.young.biz/  This test is not yet approved or cleared by the [place] FDA and  has been authorized for detection and/or diagnosis of SARS-CoV-2 by  FDA under an Emergency Use Authorization (EUA). This EUA will remain  in effect (meaning this test can be used) for the duration of the  Covid-19 declaration under Section 564(b)(1) of the Act, 21  U.S.C. section 360bbb-3(b)(1), unless the authorization is  terminated or revoked. Performed at [facility], [address] [place], [place] [address]      Total time spend on discharging this patient, including the last patient exam, discussing the hospital stay, instructions for ongoing care as it relates to all pertinent caregivers, as well as preparing the medical discharge records, prescriptions, and/or referrals  as applicable, is 50 minutes.    Darlin Priestly, MD  [facility] 01/25/2020, 11:59 AM  If 7PM-7AM, please contact night-coverage

## 2020-01-25 NOTE — Progress Notes (Addendum)
ANTICOAGULATION CONSULT NOTE - Initial Consult  Pharmacy Consult for Apixaban dosing  Indication: Apical LV mural Thrombus  Allergies  Allergen Reactions  . Penicillins Other (See Comments) and Anaphylaxis    Has patient had a PCN reaction causing immediate rash, facial/tongue/throat swelling, SOB or lightheadedness with hypotension: Yes Has patient had a PCN reaction causing severe rash involving mucus membranes or skin necrosis: No Has patient had a PCN reaction that required hospitalization: No Has patient had a PCN reaction occurring within the last 10 years: No If all of the above answers are "NO", then may proceed with Cephalosporin use.     Patient Measurements: Height: 5\' 9"  (175.3 cm) Weight: 83.9 kg (185 lb) IBW/kg (Calculated) : 70.7   Vital Signs: Temp: 97.8 F (36.6 C) (10/03 0626) BP: 148/97 (10/03 0626) Pulse Rate: 80 (10/03 0626)  Labs: Recent Labs    01/23/20 1046 01/23/20 1046 01/23/20 1232 01/23/20 1438 01/23/20 1438 01/24/20 0624 01/24/20 1240 01/24/20 1415 01/25/20 0621  HGB 15.9   < >  --  12.8*   < > 12.3*  --   --  13.1  HCT 44.2   < >  --  37.6*  --  35.6*  --   --  37.7*  PLT 268   < >  --  243  --  231  --   --  225  APTT 28  --   --   --   --   --   --   --   --   LABPROT 12.5  --   --   --   --   --   --   --   --   INR 1.0  --   --   --   --   --   --   --   --   CREATININE  --   --   --  0.94  --  0.98  --   --  1.05  TROPONINIHS 3,215*   < > 3,365*  --   --   --  6,066* 5,233*  --    < > = values in this interval not displayed.    Estimated Creatinine Clearance: 87 mL/min (by C-G formula based on SCr of 1.05 mg/dL).   Medical History: Past Medical History:  Diagnosis Date  . Diabetes mellitus without complication (HCC)   . Hypertension   . Stroke [facility])     Assessment: 49 yo make admitted with STEMI. Pharmacy now consulted for apixaban dosing for Apical LV mural Thrombus. Cardiology recommending anticoagulation dual therapy  with plavix/eliquis for 3-6 months. Aspirin discontinued per CARDs.    Plan:  Will start apixaban 10mg  twice daily x 7 days followed by apixaban 5mg  twice daily.   59, PharmD, BCPS Clinical Pharmacist 01/25/2020 9:58 AM

## 2020-01-25 NOTE — Discharge Instructions (Signed)
Information on my medicine - ELIQUIS (apixaban)  This medication education was reviewed with me or my healthcare representative as part of my discharge preparation.    Why was Eliquis prescribed for you? Eliquis was prescribed to treat blood clots that may have been found in the veins of your legs (deep vein thrombosis) or in your lungs (pulmonary embolism) and to reduce the risk of them occurring again.  What do You need to know about Eliquis ? The starting dose is 10 mg (two 5 mg tablets) taken TWICE daily for the FIRST SEVEN (7) DAYS, then on 02/01/20  the dose is reduced to ONE 5 mg tablet taken TWICE daily.  Eliquis may be taken with or without food.   Try to take the dose about the same time in the morning and in the evening. If you have difficulty swallowing the tablet whole please discuss with your pharmacist how to take the medication safely.  Take Eliquis exactly as prescribed and DO NOT stop taking Eliquis without talking to the doctor who prescribed the medication.  Stopping may increase your risk of developing a new blood clot.  Refill your prescription before you run out.  After discharge, you should have regular check-up appointments with your healthcare provider that is prescribing your Eliquis.    What do you do if you miss a dose? If a dose of ELIQUIS is not taken at the scheduled time, take it as soon as possible on the same day and twice-daily administration should be resumed. The dose should not be doubled to make up for a missed dose.  Important Safety Information A possible side effect of Eliquis is bleeding. You should call your healthcare provider right away if you experience any of the following: ? Bleeding from an injury or your nose that does not stop. ? Unusual colored urine (red or dark brown) or unusual colored stools (red or black). ? Unusual bruising for unknown reasons. ? A serious fall or if you hit your head (even if there is no bleeding).  Some  medicines may interact with Eliquis and might increase your risk of bleeding or clotting while on Eliquis. To help avoid this, consult your healthcare provider or pharmacist prior to using any new prescription or non-prescription medications, including herbals, vitamins, non-steroidal anti-inflammatory drugs (NSAIDs) and supplements.  This website has more information on Eliquis (apixaban): http://www.eliquis.com/eliquis/home

## 2020-01-27 ENCOUNTER — Encounter: Payer: Self-pay | Admitting: Internal Medicine

## 2020-05-06 ENCOUNTER — Emergency Department: Payer: No Typology Code available for payment source

## 2020-05-06 ENCOUNTER — Other Ambulatory Visit: Payer: Self-pay

## 2020-05-06 DIAGNOSIS — U071 COVID-19: Secondary | ICD-10-CM | POA: Insufficient documentation

## 2020-05-06 DIAGNOSIS — F17210 Nicotine dependence, cigarettes, uncomplicated: Secondary | ICD-10-CM | POA: Diagnosis not present

## 2020-05-06 DIAGNOSIS — Z951 Presence of aortocoronary bypass graft: Secondary | ICD-10-CM | POA: Insufficient documentation

## 2020-05-06 DIAGNOSIS — I10 Essential (primary) hypertension: Secondary | ICD-10-CM | POA: Diagnosis not present

## 2020-05-06 DIAGNOSIS — E1149 Type 2 diabetes mellitus with other diabetic neurological complication: Secondary | ICD-10-CM | POA: Insufficient documentation

## 2020-05-06 DIAGNOSIS — R509 Fever, unspecified: Secondary | ICD-10-CM

## 2020-05-06 DIAGNOSIS — E785 Hyperlipidemia, unspecified: Secondary | ICD-10-CM | POA: Insufficient documentation

## 2020-05-06 DIAGNOSIS — Z794 Long term (current) use of insulin: Secondary | ICD-10-CM | POA: Insufficient documentation

## 2020-05-06 DIAGNOSIS — E1169 Type 2 diabetes mellitus with other specified complication: Secondary | ICD-10-CM | POA: Insufficient documentation

## 2020-05-06 DIAGNOSIS — Z7901 Long term (current) use of anticoagulants: Secondary | ICD-10-CM | POA: Insufficient documentation

## 2020-05-06 DIAGNOSIS — Z79899 Other long term (current) drug therapy: Secondary | ICD-10-CM | POA: Diagnosis not present

## 2020-05-06 LAB — COMPREHENSIVE METABOLIC PANEL
ALT: 43 U/L (ref 0–44)
AST: 25 U/L (ref 15–41)
Albumin: 3.9 g/dL (ref 3.5–5.0)
Alkaline Phosphatase: 88 U/L (ref 38–126)
Anion gap: 15 (ref 5–15)
BUN: 21 mg/dL — ABNORMAL HIGH (ref 6–20)
CO2: 24 mmol/L (ref 22–32)
Calcium: 9.5 mg/dL (ref 8.9–10.3)
Chloride: 96 mmol/L — ABNORMAL LOW (ref 98–111)
Creatinine, Ser: 1.11 mg/dL (ref 0.61–1.24)
GFR, Estimated: >60 mL/min (ref >60)
Glucose, Bld: 329 mg/dL — ABNORMAL HIGH (ref 70–99)
Potassium: 4.3 mmol/L (ref 3.5–5.1)
Sodium: 135 mmol/L (ref 135–145)
Total Bilirubin: 1 mg/dL (ref 0.3–1.2)
Total Protein: 8.2 g/dL — ABNORMAL HIGH (ref 6.5–8.1)

## 2020-05-06 LAB — CBC
HCT: 46.2 % (ref 39.0–52.0)
Hemoglobin: 16 g/dL (ref 13.0–17.0)
MCH: 29.7 pg (ref 26.0–34.0)
MCHC: 34.6 g/dL (ref 30.0–36.0)
MCV: 85.9 fL (ref 80.0–100.0)
Platelets: 180 10*3/uL (ref 150–400)
RBC: 5.38 MIL/uL (ref 4.22–5.81)
RDW: 12.1 % (ref 11.5–15.5)
WBC: 6.1 10*3/uL (ref 4.0–10.5)
nRBC: 0 % (ref 0.0–0.2)

## 2020-05-06 LAB — LACTIC ACID, PLASMA: Lactic Acid, Venous: 1.6 mmol/L (ref 0.5–1.9)

## 2020-05-06 NOTE — ED Triage Notes (Signed)
EMS brings pt in from home for c/o weakness x wk, hx CVA and st w/c-bound but EMS reports pt able to stand and tx self

## 2020-05-06 NOTE — ED Triage Notes (Signed)
Pt came in from home by [facility] for co nausea, weakness and body aches for a week.

## 2020-05-07 ENCOUNTER — Emergency Department
Admission: EM | Admit: 2020-05-07 | Discharge: 2020-05-07 | Disposition: A | Discharge status: Home / Self Care | Payer: No Typology Code available for payment source | Attending: Emergency Medicine | Admitting: Emergency Medicine

## 2020-05-07 DIAGNOSIS — R509 Fever, unspecified: Secondary | ICD-10-CM

## 2020-05-07 DIAGNOSIS — R531 Weakness: Secondary | ICD-10-CM

## 2020-05-07 DIAGNOSIS — R110 Nausea: Secondary | ICD-10-CM

## 2020-05-07 LAB — RESP PANEL BY RT-PCR (FLU A&B, COVID) ARPGX2
Influenza A by PCR: NEGATIVE
Influenza B by PCR: NEGATIVE
SARS Coronavirus 2 by RT PCR: POSITIVE — AB

## 2020-05-07 MED ORDER — ONDANSETRON 4 MG PO TBDP: 4.0000 mg | ORAL_TABLET | Freq: Three times a day (TID) | ORAL | 0 refills | Status: DC | PRN

## 2020-05-07 NOTE — ED Provider Notes (Signed)
[facility] Emergency Department Provider Note   ____________________________________________   Event Date/Time   First MD Initiated Contact with Patient 05/07/20 [phone]     (approximate)  I have reviewed the triage vital signs and the nursing notes.   HISTORY  Chief Complaint Nausea and Fever    HPI [name] [name] is a 50 y.o. male stated past medical history of type 2 diabetes, hypertension, and previous stroke who presents for nausea, weakness, and body aches for the last 7 days.  Patient states that the symptoms have been worsening over that time.  Patient denies any exacerbating or relieving factors and has not tried any medications for these symptoms.  Patient does endorse associated fever.  Patient denies any recent travel or sick contacts.  Patient states that he has had his COVID vaccinations but not his influenza vaccination this year.         Past Medical History:  Diagnosis Date  . Diabetes mellitus without complication (HCC)   . Hypertension   . Stroke [facility])     Patient Active Problem List   Diagnosis Date Noted  . STEMI involving left anterior descending coronary artery (HCC) 01/23/2020  . STEMI (ST elevation myocardial infarction) (HCC) 01/23/2020  . Non-STEMI (non-ST elevated myocardial infarction) (HCC) 04/12/2019  . 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 Essential hypertension, benign 01/16/2017  . Type II diabetes mellitus with neurological manifestations, uncontrolled (HCC) 01/16/2017  . Dyslipidemia associated with type 2 diabetes mellitus (HCC) 01/16/2017  . Depression with anxiety 01/16/2017  . Hemiparesis of dominant side due to recent cerebrovascular accident (CVA) (HCC) 01/04/2017  . Dysarthria due to recent cerebrovascular accident (CVA) 01/04/2017  . Recent cerebrovascular accident (CVA) 01/04/2017  . Stroke [facility]) -  acute infarct at left PLIC, likely small vessel disease due to poorly controlled diabetes and  hyperlipidemia. 12/25/2016  . Chronic cerebrovascular accident (CVA) 12/25/2016    Past Surgical History:  Procedure Laterality Date  . APPENDECTOMY    . BACK SURGERY    . CORONARY/GRAFT ACUTE MI REVASCULARIZATION N/A 01/23/2020   Procedure: Coronary/Graft Acute MI Revascularization;  Surgeon: Alwyn Pea, MD;  Location: [facility] INVASIVE CV LAB;  Service: Cardiovascular;  Laterality: N/A;  . LEFT HEART CATH AND CORONARY ANGIOGRAPHY N/A 01/23/2020   Procedure: LEFT HEART CATH AND CORONARY ANGIOGRAPHY;  Surgeon: Alwyn Pea, MD;  Location: [facility] INVASIVE CV LAB;  Service: Cardiovascular;  Laterality: N/A;    Prior to Admission medications   Medication Sig Start Date End Date Taking? Authorizing Provider  ondansetron (ZOFRAN ODT) 4 MG disintegrating tablet Take 1 tablet (4 mg total) by mouth every 8 (eight) hours as needed for nausea or vomiting. 05/07/20  Yes Merwyn Katos, MD  amLODipine (NORVASC) 5 MG tablet Take 1 tablet (5 mg total) by mouth daily. New blood pressure medication. 01/25/20 04/24/20  Darlin Priestly, MD  APIXABAN Everlene Balls) VTE STARTER PACK (10MG  AND 5MG ) Take as directed on package: start with two-5mg  tablets twice daily for 7 days. On day 8, switch to one-5mg  tablet twice daily. 01/25/20   , MD  atenolol (TENORMIN) 50 MG tablet Take 50 mg by mouth 2 (two) times daily.     [provider]  atorvastatin (LIPITOR) 80 MG tablet Take 80 mg by mouth daily.    [provider]  buPROPion (WELLBUTRIN XL) 150 MG 24 hr tablet Take 150 mg by mouth daily.    [provider]  citalopram (CELEXA) 40 MG tablet Take 40  mg by mouth daily.    [provider]  clopidogrel (PLAVIX) 75 MG tablet Take 1 tablet (75 mg total) by mouth daily. 01/25/20 07/23/20  Darlin Priestly, MD  ezetimibe (ZETIA) 10 MG tablet Take 10 mg by mouth daily. Patient not taking: Reported on 01/23/2020    [provider]  folic acid (FOLVITE) 1 MG tablet Take 1 mg by mouth  daily.    [provider]  insulin aspart (NOVOLOG) 100 UNIT/ML injection Inject 5 Units into the skin 3 (three) times daily with meals. 01/25/20 04/24/20  Darlin Priestly, MD  insulin glargine (LANTUS) 100 UNIT/ML injection Inject 35 Units into the skin at bedtime.     [provider]  liraglutide (VICTOZA) 18 MG/3ML SOPN Inject into the skin daily.     [provider]  losartan (COZAAR) 100 MG tablet Take 100 mg by mouth every evening.    [provider]  Multiple Vitamins-Minerals (CENTRUM FLAVOR BURST ADULT) CHEW Chew 1 each by mouth daily.    [provider]  Multiple Vitamins-Minerals (MULTIVITAMIN ADULT) TABS Take 1 tablet by mouth daily.    [provider]  tamsulosin (FLOMAX) 0.4 MG CAPS capsule Take 0.4 mg by mouth 2 (two) times daily.     [provider]  triamterene-hydrochlorothiazide (DYAZIDE) 37.5-25 MG capsule Take 1 capsule by mouth daily.    [provider]    Allergies Penicillins  Family History  Problem Relation Age of Onset  . Stroke Mother   . Heart failure Mother   . Heart failure Father   . Diabetes Father     Social History Social History   Tobacco Use  . Smoking status: Current Every Day Smoker    Packs/day: 0.25    Types: Cigarettes  . Smokeless tobacco: Never Used  Vaping Use  . Vaping Use: Never used  Substance Use Topics  . Alcohol use: No  . Drug use: No    Review of Systems Constitutional: Endorses fever/chills Eyes: No visual changes. ENT: No sore throat. Cardiovascular: Denies chest pain. Respiratory: Denies shortness of breath. Gastrointestinal: No abdominal pain.  No nausea, no vomiting.  No diarrhea. Genitourinary: Negative for dysuria. Musculoskeletal: Negative for acute arthralgias Skin: Negative for rash. Neurological: Endorses generalized weakness.  Negative for headaches, numbness/paresthesias in any extremity Psychiatric: Negative for suicidal ideation/homicidal  ideation   ____________________________________________   PHYSICAL EXAM:  VITAL SIGNS: ED Triage Vitals  Enc Vitals Group     BP 05/06/20 2028 136/74     Pulse Rate 05/06/20 2028 (!) 136     Resp 05/06/20 2028 20     Temp 05/06/20 2028 (!) 100.8 F (38.2 C)     Temp Source 05/06/20 2028 Oral     SpO2 05/06/20 2007 95 %     Weight 05/06/20 2028 180 lb (81.6 kg)     Height 05/06/20 2028 5\' 9"  (1.753 m)     Head Circumference --      Peak Flow --      Pain Score 05/06/20 2028 2     Pain Loc --      Pain Edu? --      Excl. in GC? --    Constitutional: Alert and oriented. Well appearing and in no acute distress. Eyes: Conjunctivae are normal. PERRL. Head: Atraumatic. Nose: No congestion/rhinnorhea. Mouth/Throat: Mucous membranes are moist. Neck: No stridor Cardiovascular: Grossly normal heart sounds.  Good peripheral circulation. Respiratory: Normal respiratory effort.  No retractions. Gastrointestinal: Soft and nontender. No distention.  Musculoskeletal: No obvious deformities Neurologic:  Normal speech and language. No gross focal neurologic deficits are appreciated. Skin:  Skin is warm and dry. No rash noted. Psychiatric: Mood and affect are normal. Speech and behavior are normal.  ____________________________________________   LABS (all labs ordered are listed, but only abnormal results are displayed)  Labs Reviewed  COMPREHENSIVE METABOLIC PANEL - Abnormal; Notable for the following components:      Result Value   Chloride 96 (*)    Glucose, Bld 329 (*)    BUN 21 (*)    Total Protein 8.2 (*)    All other components within normal limits  RESP PANEL BY RT-PCR (FLU A&B, COVID) ARPGX2  CBC  LACTIC ACID, PLASMA  URINALYSIS, COMPLETE (UACMP) WITH MICROSCOPIC    RADIOLOGY  ED MD interpretation: Single view x-ray of the chest does not show any evidence of acute abnormalities including no pneumonia, pneumothorax, or widened mediastinum  Official radiology  report(s): DG Chest 1 View  Result Date: 05/06/2020 CLINICAL DATA:  Fever, nausea EXAM: CHEST  1 VIEW COMPARISON:  01/23/2020 FINDINGS: Loop recorder projects over the left chest. Heart and mediastinal contours are within normal limits. No focal opacities or effusions. No acute bony abnormality. IMPRESSION: No active disease. Electronically Signed   By: Charlett Nose M.D.   On: 05/06/2020 20:55    ____________________________________________   PROCEDURES  Procedure(s) performed (including Critical Care):  .1-3 Lead EKG Interpretation Performed by: Merwyn Katos, MD Authorized by: Merwyn Katos, MD     Interpretation: normal     ECG rate:  83   ECG rate assessment: normal     Rhythm: sinus rhythm     Ectopy: none     Conduction: normal       ____________________________________________   INITIAL IMPRESSION / ASSESSMENT AND PLAN / ED COURSE  As part of my medical decision making, I reviewed the following data within the electronic MEDICAL RECORD NUMBER Nursing notes reviewed and incorporated, Labs reviewed, EKG interpreted, Old chart reviewed, Radiograph reviewed and Notes from prior ED visits reviewed and incorporated        Otherwise healthy patient presenting with constellation of symptoms likely representing uncomplicated viral upper respiratory symptoms as characterized by mild pharyngitis  Unlikely PTA/RPA: no hot potato voice, no uvular deviation, Unlikely Esophageal rupture: No history of dysphagia Unlikely deep space infection/Ludwigs Low suspicion for CNS infection bacterial sinusitis, or pneumonia given exam and history.  Unlikely Strep or EBV as centor negative and with no pharyngeal exudate, posterior LAD, or splenomegaly.  Will attempt to alleviate symptoms conservatively; no overt indications at this time for antibiotics. No respiratory distress, otherwise relatively well appearing and nontoxic. Will discuss prompt follow up with PMD and strict return  precautions.      ____________________________________________   FINAL CLINICAL IMPRESSION(S) / ED DIAGNOSES  Final diagnoses:  Generalized weakness  Nausea  Fever, unspecified fever cause     ED Discharge Orders         Ordered    ondansetron (ZOFRAN ODT) 4 MG disintegrating tablet  Every 8 hours PRN        05/07/20 0509           Note:  This document was prepared using Dragon voice recognition software and may include unintentional dictation errors.   Merwyn Katos, MD 05/07/20 [phone]

## 2020-06-06 ENCOUNTER — Other Ambulatory Visit: Payer: Self-pay

## 2020-06-06 ENCOUNTER — Encounter: Payer: Self-pay | Admitting: Emergency Medicine

## 2020-06-06 ENCOUNTER — Emergency Department
Admission: EM | Admit: 2020-06-06 | Discharge: 2020-06-06 | Disposition: A | Discharge status: Home / Self Care | Payer: No Typology Code available for payment source | Attending: Emergency Medicine | Admitting: Emergency Medicine

## 2020-06-06 ENCOUNTER — Emergency Department: Payer: No Typology Code available for payment source

## 2020-06-06 DIAGNOSIS — F17210 Nicotine dependence, cigarettes, uncomplicated: Secondary | ICD-10-CM | POA: Diagnosis not present

## 2020-06-06 DIAGNOSIS — K2100 Gastro-esophageal reflux disease with esophagitis, without bleeding: Secondary | ICD-10-CM | POA: Diagnosis not present

## 2020-06-06 DIAGNOSIS — Z794 Long term (current) use of insulin: Secondary | ICD-10-CM | POA: Insufficient documentation

## 2020-06-06 DIAGNOSIS — E119 Type 2 diabetes mellitus without complications: Secondary | ICD-10-CM | POA: Diagnosis not present

## 2020-06-06 DIAGNOSIS — Z79899 Other long term (current) drug therapy: Secondary | ICD-10-CM | POA: Diagnosis not present

## 2020-06-06 DIAGNOSIS — Z7901 Long term (current) use of anticoagulants: Secondary | ICD-10-CM | POA: Diagnosis not present

## 2020-06-06 DIAGNOSIS — Z7902 Long term (current) use of antithrombotics/antiplatelets: Secondary | ICD-10-CM | POA: Diagnosis not present

## 2020-06-06 DIAGNOSIS — R112 Nausea with vomiting, unspecified: Secondary | ICD-10-CM

## 2020-06-06 DIAGNOSIS — I10 Essential (primary) hypertension: Secondary | ICD-10-CM | POA: Diagnosis not present

## 2020-06-06 DIAGNOSIS — R079 Chest pain, unspecified: Secondary | ICD-10-CM | POA: Diagnosis present

## 2020-06-06 DIAGNOSIS — Z8673 Personal history of transient ischemic attack (TIA), and cerebral infarction without residual deficits: Secondary | ICD-10-CM | POA: Diagnosis not present

## 2020-06-06 DIAGNOSIS — R0789 Other chest pain: Secondary | ICD-10-CM | POA: Diagnosis not present

## 2020-06-06 LAB — BASIC METABOLIC PANEL
Anion gap: 10 (ref 5–15)
BUN: 15 mg/dL (ref 6–20)
CO2: 26 mmol/L (ref 22–32)
Calcium: 9.6 mg/dL (ref 8.9–10.3)
Chloride: 100 mmol/L (ref 98–111)
Creatinine, Ser: 1.08 mg/dL (ref 0.61–1.24)
GFR, Estimated: >60 mL/min (ref >60)
Glucose, Bld: 167 mg/dL — ABNORMAL HIGH (ref 70–99)
Potassium: 3.8 mmol/L (ref 3.5–5.1)
Sodium: 136 mmol/L (ref 135–145)

## 2020-06-06 LAB — HEPATIC FUNCTION PANEL
ALT: 40 U/L (ref 0–44)
AST: 30 U/L (ref 15–41)
Albumin: 3.9 g/dL (ref 3.5–5.0)
Alkaline Phosphatase: 74 U/L (ref 38–126)
Bilirubin, Direct: 0.2 mg/dL (ref 0.0–0.2)
Indirect Bilirubin: 0.7 mg/dL (ref 0.3–0.9)
Total Bilirubin: 0.9 mg/dL (ref 0.3–1.2)
Total Protein: 7.6 g/dL (ref 6.5–8.1)

## 2020-06-06 LAB — CBC
HCT: 44.1 % (ref 39.0–52.0)
Hemoglobin: 15 g/dL (ref 13.0–17.0)
MCH: 29.9 pg (ref 26.0–34.0)
MCHC: 34 g/dL (ref 30.0–36.0)
MCV: 87.8 fL (ref 80.0–100.0)
Platelets: 242 10*3/uL (ref 150–400)
RBC: 5.02 MIL/uL (ref 4.22–5.81)
RDW: 12.1 % (ref 11.5–15.5)
WBC: 8.9 10*3/uL (ref 4.0–10.5)
nRBC: 0 % (ref 0.0–0.2)

## 2020-06-06 LAB — TROPONIN I (HIGH SENSITIVITY)
Troponin I (High Sensitivity): 14 ng/L (ref <18)
Troponin I (High Sensitivity): 13 ng/L (ref <18)

## 2020-06-06 LAB — LIPASE, BLOOD: Lipase: 37 U/L (ref 11–51)

## 2020-06-06 MED ORDER — ONDANSETRON 4 MG PO TBDP: 4.0000 mg | ORAL_TABLET | Freq: Three times a day (TID) | ORAL | 0 refills | Status: DC | PRN

## 2020-06-06 MED ORDER — ALUM & MAG HYDROXIDE-SIMETH 200-200-20 MG/5ML PO SUSP
15.0000 mL | Freq: Once | ORAL | Status: AC
  Administered 2020-06-06: 15 mL via ORAL
  Filled 2020-06-06: qty 30

## 2020-06-06 MED ORDER — LACTATED RINGERS IV BOLUS
1000.0000 mL | Freq: Once | INTRAVENOUS | Status: AC
  Administered 2020-06-06: 1000 mL via INTRAVENOUS

## 2020-06-06 MED ORDER — LIDOCAINE VISCOUS HCL 2 % MT SOLN
15.0000 mL | Freq: Once | OROMUCOSAL | Status: AC
  Administered 2020-06-06: 15 mL via ORAL
  Filled 2020-06-06: qty 15

## 2020-06-06 MED ORDER — ONDANSETRON HCL 4 MG/2ML IJ SOLN
4.0000 mg | Freq: Once | INTRAMUSCULAR | Status: AC
  Administered 2020-06-06: 4 mg via INTRAVENOUS
  Filled 2020-06-06: qty 2

## 2020-06-06 NOTE — ED Notes (Signed)
Patient denies chest pain at this time. States comes and goes with hiccups get burning in chest. Pt with hx of stroke in past and heart attack per patient.

## 2020-06-06 NOTE — ED Triage Notes (Signed)
Pt reports vomiting for the last week and some slight cp to center of his chest non-radiating.

## 2020-06-06 NOTE — ED Provider Notes (Signed)
[facility] Emergency Department Provider Note   ____________________________________________   Event Date/Time   First MD Initiated Contact with Patient 06/06/20 1728     (approximate)  I have reviewed the triage vital signs and the nursing notes.   HISTORY  Chief Complaint Emesis and Chest Pain    HPI [name] [name] is a 50 y.o. male with past medical history of hypertension, diabetes, hyperlipidemia, stroke, CAD, and left ventricular thrombus on Eliquis who presents to the ED complaining of chest pain.  Patient reports that he has had approximately 1 week of intermittent nausea with vomiting.  After developing vomiting he has had burning pain in his chest that "feels like heartburn."  Pain seems to be worse when he has hiccups.  He had another episode of vomiting overnight last night, but denies any abdominal pain.  He has not had any fevers, cough, shortness of breath, or diarrhea.  He spoke with his PCP earlier today, who recommended he come to the ED to get checked out due to pain in his chest.        Past Medical History:  Diagnosis Date  . Diabetes mellitus without complication (HCC)   . Hypertension   . Stroke [facility])     Patient Active Problem List   Diagnosis Date Noted  . STEMI involving left anterior descending coronary artery (HCC) 01/23/2020  . STEMI (ST elevation myocardial infarction) (HCC) 01/23/2020  . Non-STEMI (non-ST elevated myocardial infarction) (HCC) 04/12/2019  . 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 Essential hypertension, benign 01/16/2017  . Type II diabetes mellitus with neurological manifestations, uncontrolled (HCC) 01/16/2017  . Dyslipidemia associated with type 2 diabetes mellitus (HCC) 01/16/2017  . Depression with anxiety 01/16/2017  . Hemiparesis of dominant side due to recent cerebrovascular accident (CVA) (HCC) 01/04/2017  . Dysarthria due to recent cerebrovascular accident (CVA) 01/04/2017  . Recent  cerebrovascular accident (CVA) 01/04/2017  . Stroke [facility]) -  acute infarct at left PLIC, likely small vessel disease due to poorly controlled diabetes and hyperlipidemia. 12/25/2016  . Chronic cerebrovascular accident (CVA) 12/25/2016    Past Surgical History:  Procedure Laterality Date  . APPENDECTOMY    . BACK SURGERY    . CORONARY/GRAFT ACUTE MI REVASCULARIZATION N/A 01/23/2020   Procedure: Coronary/Graft Acute MI Revascularization;  Surgeon: Alwyn Pea, MD;  Location: [facility] INVASIVE CV LAB;  Service: Cardiovascular;  Laterality: N/A;  . LEFT HEART CATH AND CORONARY ANGIOGRAPHY N/A 01/23/2020   Procedure: LEFT HEART CATH AND CORONARY ANGIOGRAPHY;  Surgeon: Alwyn Pea, MD;  Location: [facility] INVASIVE CV LAB;  Service: Cardiovascular;  Laterality: N/A;    Prior to Admission medications   Medication Sig Start Date End Date Taking? Authorizing Provider  ondansetron (ZOFRAN ODT) 4 MG disintegrating tablet Take 1 tablet (4 mg total) by mouth every 8 (eight) hours as needed for nausea or vomiting. 06/06/20  Yes Chesley Noon, MD  amLODipine (NORVASC) 5 MG tablet Take 1 tablet (5 mg total) by mouth daily. New blood pressure medication. 01/25/20 04/24/20  Darlin Priestly, MD  APIXABAN Everlene Balls) VTE STARTER PACK (10MG  AND 5MG ) Take as directed on package: start with two-5mg  tablets twice daily for 7 days. On day 8, switch to one-5mg  tablet twice daily. 01/25/20   , MD  atenolol (TENORMIN) 50 MG tablet Take 50 mg by mouth 2 (two) times daily.     [provider]  atorvastatin (LIPITOR) 80 MG tablet Take 80 mg by mouth daily.  [provider]  buPROPion (WELLBUTRIN XL) 150 MG 24 hr tablet Take 150 mg by mouth daily.    [provider]  citalopram (CELEXA) 40 MG tablet Take 40 mg by mouth daily.    [provider]  clopidogrel (PLAVIX) 75 MG tablet Take 1 tablet (75 mg total) by mouth daily. 01/25/20 07/23/20  Darlin Priestly, MD  ezetimibe (ZETIA) 10 MG tablet  Take 10 mg by mouth daily. Patient not taking: Reported on 01/23/2020    [provider]  folic acid (FOLVITE) 1 MG tablet Take 1 mg by mouth daily.    [provider]  insulin aspart (NOVOLOG) 100 UNIT/ML injection Inject 5 Units into the skin 3 (three) times daily with meals. 01/25/20 04/24/20  Darlin Priestly, MD  insulin glargine (LANTUS) 100 UNIT/ML injection Inject 35 Units into the skin at bedtime.     [provider]  liraglutide (VICTOZA) 18 MG/3ML SOPN Inject into the skin daily.     [provider]  losartan (COZAAR) 100 MG tablet Take 100 mg by mouth every evening.    [provider]  Multiple Vitamins-Minerals (CENTRUM FLAVOR BURST ADULT) CHEW Chew 1 each by mouth daily.    [provider]  Multiple Vitamins-Minerals (MULTIVITAMIN ADULT) TABS Take 1 tablet by mouth daily.    [provider]  tamsulosin (FLOMAX) 0.4 MG CAPS capsule Take 0.4 mg by mouth 2 (two) times daily.     [provider]  triamterene-hydrochlorothiazide (DYAZIDE) 37.5-25 MG capsule Take 1 capsule by mouth daily.    [provider]    Allergies Penicillins  Family History  Problem Relation Age of Onset  . Stroke Mother   . Heart failure Mother   . Heart failure Father   . Diabetes Father     Social History Social History   Tobacco Use  . Smoking status: Current Every Day Smoker    Packs/day: 0.25    Types: Cigarettes  . Smokeless tobacco: Never Used  Vaping Use  . Vaping Use: Never used  Substance Use Topics  . Alcohol use: No  . Drug use: No    Review of Systems  Constitutional: No fever/chills Eyes: No visual changes. ENT: No sore throat. Cardiovascular: Positive for chest pain. Respiratory: Denies shortness of breath. Gastrointestinal: No abdominal pain.  Positive for nausea and vomiting.  No diarrhea.  No constipation. Genitourinary: Negative for dysuria. Musculoskeletal: Negative for back pain. Skin: Negative  for rash. Neurological: Negative for headaches, focal weakness or numbness.  ____________________________________________   PHYSICAL EXAM:  VITAL SIGNS: ED Triage Vitals  Enc Vitals Group     BP 06/06/20 1237 (!) 166/110     Pulse Rate 06/06/20 1237 (!) 104     Resp 06/06/20 1237 17     Temp 06/06/20 1237 98.2 F (36.8 C)     Temp Source 06/06/20 1237 Oral     SpO2 06/06/20 1237 100 %     Weight 06/06/20 1244 185 lb (83.9 kg)     Height 06/06/20 1244 5\' 9"  (1.753 m)     Head Circumference --      Peak Flow --      Pain Score 06/06/20 1244 3     Pain Loc --      Pain Edu? --      Excl. in GC? --     Constitutional: Alert and oriented. Eyes: Conjunctivae are normal. Head: Atraumatic. Nose: No congestion/rhinnorhea. Mouth/Throat: Mucous membranes are moist. Neck: Normal ROM Cardiovascular: Normal  rate, regular rhythm. Grossly normal heart sounds.  2+ radial pulses bilaterally. Respiratory: Normal respiratory effort.  No retractions. Lungs CTAB.  No chest wall tenderness to palpation. Gastrointestinal: Soft and nontender. No distention. Genitourinary: deferred Musculoskeletal: No lower extremity tenderness nor edema. Neurologic:  Normal speech and language. No gross focal neurologic deficits are appreciated. Skin:  Skin is warm, dry and intact. No rash noted. Psychiatric: Mood and affect are normal. Speech and behavior are normal.  ____________________________________________   LABS (all labs ordered are listed, but only abnormal results are displayed)  Labs Reviewed  BASIC METABOLIC PANEL - Abnormal; Notable for the following components:      Result Value   Glucose, Bld 167 (*)    All other components within normal limits  CBC  HEPATIC FUNCTION PANEL  LIPASE, BLOOD  TROPONIN I (HIGH SENSITIVITY)  TROPONIN I (HIGH SENSITIVITY)   ____________________________________________  EKG  ED ECG REPORT I, Chesley Noon, the attending physician, personally viewed and  interpreted this ECG.   Date: 06/06/2020  EKG Time: 12:25  Rate: 100  Rhythm: normal sinus rhythm  Axis: Normal  Intervals:none  ST&T Change: None, artifact read as ST elevation by commputer   PROCEDURES  Procedure(s) performed (including Critical Care):  Procedures   ____________________________________________   INITIAL IMPRESSION / ASSESSMENT AND PLAN / ED COURSE       50 year old male with past medical history of hypertension, hyperlipidemia, diabetes, stroke, CAD, and left ventricular thrombus on Eliquis who presents to the ED with burning pain in the center of his chest after dealing with 1 week of nausea and vomiting.  He has no abdominal tenderness on exam and appears well-hydrated, vomiting seems to be occurring no more than once or twice per day.  EKG shows no evidence of arrhythmia or ischemia and initial troponin is negative, repeat pending at this time.  I have a low suspicion for ACS given symptoms sound most consistent with reflux related to his vomiting.  We will treat with Zofran and GI cocktail, also add on LFTs and lipase.  If remainder of work-up is unremarkable, patient will be appropriate for discharge home with PCP and cardiology follow-up.  Repeat troponin within normal limits, LFTs and lipase are also unremarkable.  Patient reports feeling much better following nausea medication and GI cocktail.  Low suspicion for cardiac etiology for his chest pain at this time and he is appropriate for discharge home with PCP and cardiology follow-up.  He was counseled to return to the ED for new worsening symptoms, patient agrees with plan.      ____________________________________________   FINAL CLINICAL IMPRESSION(S) / ED DIAGNOSES  Final diagnoses:  Atypical chest pain  Non-intractable vomiting with nausea, unspecified vomiting type  Gastroesophageal reflux disease with esophagitis without hemorrhage     ED Discharge Orders         Ordered    ondansetron  (ZOFRAN ODT) 4 MG disintegrating tablet  Every 8 hours PRN        06/06/20 1929           Note:  This document was prepared using Dragon voice recognition software and may include unintentional dictation errors.   Chesley Noon, MD 06/06/20 [phone]

## 2021-01-02 ENCOUNTER — Encounter (HOSPITAL_COMMUNITY): Payer: Self-pay

## 2021-01-02 ENCOUNTER — Emergency Department (HOSPITAL_COMMUNITY)
Admission: EM | Admit: 2021-01-02 | Discharge: 2021-01-02 | Disposition: A | Discharge status: Home / Self Care | Payer: No Typology Code available for payment source | Attending: Emergency Medicine | Admitting: Emergency Medicine

## 2021-01-02 ENCOUNTER — Other Ambulatory Visit: Payer: Self-pay

## 2021-01-02 ENCOUNTER — Emergency Department (HOSPITAL_COMMUNITY): Payer: No Typology Code available for payment source

## 2021-01-02 DIAGNOSIS — F17210 Nicotine dependence, cigarettes, uncomplicated: Secondary | ICD-10-CM | POA: Insufficient documentation

## 2021-01-02 DIAGNOSIS — Z7902 Long term (current) use of antithrombotics/antiplatelets: Secondary | ICD-10-CM | POA: Insufficient documentation

## 2021-01-02 DIAGNOSIS — Z79899 Other long term (current) drug therapy: Secondary | ICD-10-CM | POA: Diagnosis not present

## 2021-01-02 DIAGNOSIS — Z7982 Long term (current) use of aspirin: Secondary | ICD-10-CM | POA: Insufficient documentation

## 2021-01-02 DIAGNOSIS — R1013 Epigastric pain: Secondary | ICD-10-CM | POA: Insufficient documentation

## 2021-01-02 DIAGNOSIS — E876 Hypokalemia: Secondary | ICD-10-CM | POA: Diagnosis not present

## 2021-01-02 DIAGNOSIS — Z794 Long term (current) use of insulin: Secondary | ICD-10-CM | POA: Insufficient documentation

## 2021-01-02 DIAGNOSIS — I10 Essential (primary) hypertension: Secondary | ICD-10-CM | POA: Diagnosis not present

## 2021-01-02 DIAGNOSIS — E119 Type 2 diabetes mellitus without complications: Secondary | ICD-10-CM | POA: Diagnosis not present

## 2021-01-02 DIAGNOSIS — D72829 Elevated white blood cell count, unspecified: Secondary | ICD-10-CM | POA: Diagnosis not present

## 2021-01-02 DIAGNOSIS — Z7901 Long term (current) use of anticoagulants: Secondary | ICD-10-CM | POA: Insufficient documentation

## 2021-01-02 DIAGNOSIS — R110 Nausea: Secondary | ICD-10-CM | POA: Diagnosis not present

## 2021-01-02 DIAGNOSIS — Z7984 Long term (current) use of oral hypoglycemic drugs: Secondary | ICD-10-CM | POA: Insufficient documentation

## 2021-01-02 LAB — CBC WITH DIFFERENTIAL/PLATELET
Abs Immature Granulocytes: 0.07 10*3/uL (ref 0.00–0.07)
Basophils Absolute: 0.1 10*3/uL (ref 0.0–0.1)
Basophils Relative: 0 %
Eosinophils Absolute: 0 10*3/uL (ref 0.0–0.5)
Eosinophils Relative: 0 %
HCT: 48.2 % (ref 39.0–52.0)
Hemoglobin: 16.6 g/dL (ref 13.0–17.0)
Immature Granulocytes: 1 %
Lymphocytes Relative: 17 %
Lymphs Abs: 2.2 10*3/uL (ref 0.7–4.0)
MCH: 30.2 pg (ref 26.0–34.0)
MCHC: 34.4 g/dL (ref 30.0–36.0)
MCV: 87.8 fL (ref 80.0–100.0)
Monocytes Absolute: 0.9 10*3/uL (ref 0.1–1.0)
Monocytes Relative: 7 %
Neutro Abs: 9.7 10*3/uL — ABNORMAL HIGH (ref 1.7–7.7)
Neutrophils Relative %: 75 %
Platelets: 256 10*3/uL (ref 150–400)
RBC: 5.49 MIL/uL (ref 4.22–5.81)
RDW: 12 % (ref 11.5–15.5)
WBC: 13 10*3/uL — ABNORMAL HIGH (ref 4.0–10.5)
nRBC: 0 % (ref 0.0–0.2)

## 2021-01-02 LAB — COMPREHENSIVE METABOLIC PANEL
ALT: 33 U/L (ref 0–44)
AST: 17 U/L (ref 15–41)
Albumin: 4.1 g/dL (ref 3.5–5.0)
Alkaline Phosphatase: 70 U/L (ref 38–126)
Anion gap: 12 (ref 5–15)
BUN: 33 mg/dL — ABNORMAL HIGH (ref 6–20)
CO2: 33 mmol/L — ABNORMAL HIGH (ref 22–32)
Calcium: 10.2 mg/dL (ref 8.9–10.3)
Chloride: 92 mmol/L — ABNORMAL LOW (ref 98–111)
Creatinine, Ser: 1.67 mg/dL — ABNORMAL HIGH (ref 0.61–1.24)
GFR, Estimated: 50 mL/min — ABNORMAL LOW (ref >60)
Glucose, Bld: 256 mg/dL — ABNORMAL HIGH (ref 70–99)
Potassium: 3.2 mmol/L — ABNORMAL LOW (ref 3.5–5.1)
Sodium: 137 mmol/L (ref 135–145)
Total Bilirubin: 0.9 mg/dL (ref 0.3–1.2)
Total Protein: 8.1 g/dL (ref 6.5–8.1)

## 2021-01-02 LAB — URINALYSIS, ROUTINE W REFLEX MICROSCOPIC
Bacteria, UA: NONE SEEN
Bilirubin Urine: NEGATIVE
Glucose, UA: >=1000 mg/dL
Hgb urine dipstick: NEGATIVE
Leukocytes,Ua: NEGATIVE
Nitrite: NEGATIVE
Protein, ur: NEGATIVE mg/dL
Specific Gravity, Urine: <=1.005 (ref 1.005–1.030)
pH: 7 (ref 5.0–8.0)

## 2021-01-02 LAB — LIPASE, BLOOD: Lipase: 56 U/L — ABNORMAL HIGH (ref 11–51)

## 2021-01-02 MED ORDER — ONDANSETRON HCL 4 MG/2ML IJ SOLN
4.0000 mg | Freq: Once | INTRAMUSCULAR | Status: AC
  Administered 2021-01-02: 4 mg via INTRAVENOUS
  Filled 2021-01-02: qty 2

## 2021-01-02 MED ORDER — PANTOPRAZOLE SODIUM 20 MG PO TBEC: 20.0000 mg | DELAYED_RELEASE_TABLET | Freq: Every day | ORAL | 0 refills | Status: AC

## 2021-01-02 MED ORDER — METOCLOPRAMIDE HCL 10 MG PO TABS: 10.0000 mg | ORAL_TABLET | Freq: Two times a day (BID) | ORAL | 0 refills | Status: AC | PRN

## 2021-01-02 MED ORDER — SODIUM CHLORIDE 0.9 % IV BOLUS
1000.0000 mL | Freq: Once | INTRAVENOUS | Status: AC
Start: 2021-01-02 — End: 2021-01-02
  Administered 2021-01-02: 1000 mL via INTRAVENOUS

## 2021-01-02 MED ORDER — IOHEXOL 350 MG/ML SOLN
80.0000 mL | Freq: Once | INTRAVENOUS | Status: AC | PRN
  Administered 2021-01-02: 80 mL via INTRAVENOUS

## 2021-01-02 MED ORDER — SUCRALFATE 1 G PO TABS: 1.0000 g | ORAL_TABLET | Freq: Three times a day (TID) | ORAL | 0 refills | Status: DC

## 2021-01-02 NOTE — ED Triage Notes (Signed)
Pt reports abdominal pain, nausea, vomiting, generalized weakness x 2 months

## 2021-01-02 NOTE — ED Provider Notes (Signed)
Emergency Medicine Provider Triage Evaluation Note  [name] [name] , a 50 y.o. male  was evaluated in triage.  Pt complains of abdominal pain located around his umbilicus associated with nausea and vomiting x2 months.  Girlfriend at bedside notes patient has been vomiting daily for the past 2 months.  Emesis is nonbloody and nonbilious.  Last bowel movement was yesterday which was normal.  Patient states he has been having some trouble with constipation.  He also endorses generalized weakness.  Patient had a previous appendectomy. No fever or chills. No urinary symptoms.   Review of Systems  Positive: Abdominal pain, nausea, vomiting Negative: fever  Physical Exam  BP 103/72 (BP Location: Right Arm)   Pulse 77   Temp 98.7 F (37.1 C)   Resp 18   Ht 5\' 9"  (1.753 m)   Wt 81.6 kg   SpO2 97%   BMI 26.58 kg/m  Gen:   Awake, no distress   Resp:  Normal effort  MSK:   Moves extremities without difficulty  Other:    Medical Decision Making  Medically screening exam initiated at 3:43 PM.  Appropriate orders placed.  MYRTLE BARNHARD was informed that the remainder of the evaluation will be completed by another provider, this initial triage assessment does not replace that evaluation, and the importance of remaining in the ED until their evaluation is complete.  Abdominal labs   Orville Govern 01/02/21 1545    03/04/21, MD 01/02/21 [id number]

## 2021-01-02 NOTE — Discharge Instructions (Addendum)
Take the medications as prescribed  Follow up with the Gastroenterologist  Return for new or worsening symptoms

## 2021-01-02 NOTE — ED Provider Notes (Signed)
[facility]-EMERGENCY DEPT Provider Note   CSN: [id number] Arrival date & time: 01/02/21  1455    History Chief Complaint  Patient presents with   Abdominal Pain   Nausea    [name] [name] is a 50 y.o. male with history significant for STEMI, CVA, hypertension, diabetes who presents for evaluation of abdominal pain.  Patient states he has had recurrent pain around his umbilicus associate with nausea and NBNB vomiting over the last 2 months.  Symptoms are intermittent in nature.  States he vomits almost daily.  He was given Zofran while he was at the [facility] which she states intermittently helps.  He apparently was supposed to follow-up with a gastroenterologist for EGD and colonoscopy however did not follow-up.  Last episode of emesis this morning.  States initially primary care thought emesis was due to a certain diabetes medication which he was taken off of.  He is currently on glipizide.  States blood sugars have been running into the 200s.  He feels generally weak over the last few months.  Last bowel movement yesterday.  Typically goes daily.  No melena or bright blood per rectum.  Prior open appendectomy.  He is passing flatus.  No fever, chills, headache, chest pain, shortness of breath, cough, back pain, urinary complaints, unilateral weakness or numbness.  Denies additional aggravating or alleviating factors.  States he does not have any current pain however would like some nausea medicine  History obtained from patient, friend in room and past medical records.  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 12/25/2016   Chronic cerebrovascular accident (CVA) 12/25/2016    Past Surgical History:  Procedure Laterality Date   APPENDECTOMY     BACK SURGERY     CORONARY/GRAFT ACUTE MI REVASCULARIZATION N/A 01/23/2020   Procedure: Coronary/Graft Acute MI Revascularization;  Surgeon: Alwyn Peaallwood, Dwayne D, MD;  Location: [facility] INVASIVE CV LAB;  Service: Cardiovascular;  Laterality: N/A;   LEFT HEART CATH AND CORONARY ANGIOGRAPHY N/A 01/23/2020   Procedure: LEFT HEART CATH AND CORONARY ANGIOGRAPHY;  Surgeon: Alwyn Peaallwood, Dwayne D, MD;  Location: [facility] INVASIVE CV LAB;  Service: Cardiovascular;  Laterality: N/A;       Family History  Problem Relation Age of Onset   Stroke Mother    Heart failure Mother    Heart failure Father    Diabetes Father     Social History   Tobacco Use   Smoking status: Every Day    Packs/day: 0.25    Types: Cigarettes   Smokeless tobacco: Never  Vaping Use  Vaping Use: Never used  Substance Use Topics   Alcohol use: No   Drug use: No    Home Medications Prior to Admission medications   Medication Sig Start Date End Date Taking? Authorizing Provider  amLODipine (NORVASC) 10 MG tablet Take 1 tablet by mouth daily. 12/10/20  Yes [provider]  aspirin 81 MG EC tablet Take 81 mg by mouth daily. 10/18/20  Yes [provider]  carvedilol (COREG) 25 MG tablet Take 25 mg by mouth in the morning and at bedtime. 07/08/20  Yes [provider]  chlorthalidone (HYGROTON) 25 MG tablet Take 25 mg by mouth daily. 12/07/20  Yes [provider]  clopidogrel (PLAVIX) 75 MG tablet Take 75 mg by mouth daily. 10/18/20  Yes [provider]  metoCLOPramide (REGLAN) 10 MG tablet Take 1 tablet (10 mg total) by mouth every 12 (twelve) hours as needed for nausea. 01/02/21  Yes Miran Kautzman A, PA-C  pantoprazole (PROTONIX) 20 MG tablet Take 1 tablet (20 mg total) by mouth daily. 01/02/21  Yes Abel Ra A, PA-C  sucralfate (CARAFATE) 1 g tablet Take 1 tablet (1 g total) by mouth 4 (four) times daily -  with meals and at bedtime for 14 days. 01/02/21 01/16/21 Yes Sondra Blixt A, PA-C  amLODipine (NORVASC) 5 MG tablet Take 1 tablet (5 mg total) by mouth daily. New blood pressure medication. 01/25/20 04/24/20  Darlin Priestly, MD  APIXABAN Everlene Balls) VTE STARTER PACK (10MG  AND 5MG ) Take as directed on package: start with two-5mg  tablets twice daily for 7 days. On day 8, switch to one-5mg  tablet twice daily. 01/25/20   , MD  atenolol (TENORMIN) 50 MG tablet Take 50 mg by mouth 2 (two) times daily.     [provider]  atorvastatin (LIPITOR) 80 MG tablet Take 80 mg by mouth daily.    [provider]  buPROPion (WELLBUTRIN XL) 150 MG 24 hr tablet Take 150 mg by mouth daily.    [provider]  citalopram (CELEXA) 40 MG tablet Take 40 mg by mouth daily.    [provider]  ezetimibe (ZETIA) 10 MG tablet Take 10 mg by mouth daily. Patient not taking: Reported on 01/23/2020    [provider]  folic acid (FOLVITE) 1 MG tablet Take 1 mg by mouth daily.    [provider]  insulin aspart (NOVOLOG) 100 UNIT/ML injection Inject 5 Units into the skin 3 (three) times daily with meals. 01/25/20 04/24/20  03/26/20, MD  insulin glargine (LANTUS) 100 UNIT/ML injection Inject 35  Units into the skin at bedtime.     [provider]  liraglutide (VICTOZA) 18 MG/3ML SOPN Inject into the skin daily.     [provider]  losartan (COZAAR) 100 MG tablet Take 100 mg by mouth every evening.    [provider]  metFORMIN (GLUCOPHAGE-XR) 500 MG 24 hr tablet Take 2,000 mg by mouth daily before breakfast.    [provider]  Multiple Vitamins-Minerals (CENTRUM FLAVOR BURST ADULT) CHEW Chew 1 each by mouth daily.    [provider]  Multiple Vitamins-Minerals (MULTIVITAMIN ADULT) TABS Take 1 tablet by mouth daily.    [provider]  ondansetron (ZOFRAN ODT) 4 MG disintegrating tablet Take 1 tablet (4 mg total) by mouth every 8 (eight) hours as needed for nausea or vomiting. 06/06/20   Darlin Priestly, MD  tamsulosin (FLOMAX) 0.4 MG CAPS capsule Take 0.4 mg by mouth 2 (two) times daily.  [provider]  triamterene-hydrochlorothiazide (DYAZIDE) 37.5-25 MG capsule Take 1 capsule by mouth daily.    [provider]    Allergies    Penicillins  Review of Systems   Review of Systems  Constitutional: Negative.   HENT: Negative.    Respiratory: Negative.    Cardiovascular: Negative.   Gastrointestinal:  Positive for abdominal pain, nausea and vomiting. Negative for abdominal distention, anal bleeding, blood in stool, constipation, diarrhea and rectal pain.  Genitourinary: Negative.   Musculoskeletal: Negative.   Skin: Negative.   Neurological:  Positive for weakness (Generalized). Negative for dizziness, tremors, syncope, facial asymmetry, speech difficulty, light-headedness, numbness and headaches.  All other systems reviewed and are negative.  Physical Exam Updated Vital Signs BP (!) 157/98   Pulse 74   Temp (!) 97.5 F (36.4 C) (Oral)   Resp 18   Ht 5\' 9"  (1.753 m)   Wt 81.6 kg   SpO2 98%   BMI 26.58 kg/m   Physical Exam Vitals and nursing note reviewed.  Constitutional:      General: He is  not in acute distress.    Appearance: He is well-developed. He is not ill-appearing, toxic-appearing or diaphoretic.  HENT:     Head: Normocephalic and atraumatic.     Mouth/Throat:     Mouth: Mucous membranes are moist.  Eyes:     Pupils: Pupils are equal, round, and reactive to light.  Cardiovascular:     Rate and Rhythm: Normal rate and regular rhythm.     Heart sounds: Normal heart sounds.  Pulmonary:     Effort: Pulmonary effort is normal. No respiratory distress.     Breath sounds: Normal breath sounds.     Comments: Clear bilaterally.  Speaks in full sentences Abdominal:     General: Bowel sounds are normal. There is no distension.     Palpations: Abdomen is soft.     Tenderness: There is abdominal tenderness in the periumbilical area. There is no right CVA tenderness, left CVA tenderness, guarding or rebound. Negative signs include Murphy's sign and McBurney's sign.     Hernia: No hernia is present.     Comments: Soft, mild tenderness at periumbilical and epigastric region.  Prior appendectomy scar without surrounding erythema or warmth.  Musculoskeletal:        General: Normal range of motion.     Cervical back: Normal range of motion and neck supple.  Skin:    General: Skin is warm and dry.     Capillary Refill: Capillary refill takes less than 2 seconds.     Comments: No rashes or lesions  Neurological:     General: No focal deficit present.     Mental Status: He is alert and oriented to person, place, and time.    ED Results / Procedures / Treatments   Labs (all labs ordered are listed, but only abnormal results are displayed) Labs Reviewed  CBC WITH DIFFERENTIAL/PLATELET - Abnormal; Notable for the following components:      Result Value   WBC 13.0 (*)    Neutro Abs 9.7 (*)    All other components within normal limits  COMPREHENSIVE METABOLIC PANEL - Abnormal; Notable for the following components:   Potassium 3.2 (*)    Chloride 92 (*)    CO2 33 (*)     Glucose, Bld 256 (*)    BUN 33 (*)    Creatinine, Ser 1.67 (*)    GFR, Estimated 50 (*)    All other  components within normal limits  LIPASE, BLOOD - Abnormal; Notable for the following components:   Lipase 56 (*)    All other components within normal limits  URINALYSIS, ROUTINE W REFLEX MICROSCOPIC - Abnormal; Notable for the following components:   Color, Urine YELLOW (*)    APPearance CLEAR (*)    Ketones, ur TRACE (*)    All other components within normal limits    EKG None  Radiology CT Abdomen Pelvis W Contrast  Result Date: 01/02/2021 CLINICAL DATA:  Nausea/vomiting, abdominal pain, prior appendectomy EXAM: CT ABDOMEN AND PELVIS WITH CONTRAST TECHNIQUE: Multidetector CT imaging of the abdomen and pelvis was performed using the standard protocol following bolus administration of intravenous contrast. CONTRAST:  28mL OMNIPAQUE IOHEXOL 350 MG/ML SOLN COMPARISON:  09/26/2016 FINDINGS: Lower chest: Lung bases are clear. Hepatobiliary: Hepatic steatosis with focal fatty sparing along the gallbladder fossa. 18 mm cyst with peripheral calcification in the posterior right hepatic lobe. Gallbladder is unremarkable. No intrahepatic or extrahepatic ductal dilatation. Pancreas: Within normal limits. Spleen: Within normal limits. Adrenals/Urinary Tract: Adrenal glands are within normal limits. 5.4 cm bilobed cyst with layering calcification in the left interpolar region. 2 mm interpolar right renal calculus (coronal image 87). No hydronephrosis. Bladder is within normal limits. Stomach/Bowel: Stomach is within normal limits. No evidence of bowel obstruction. Prior appendectomy. Mild to moderate left colonic stool burden. Vascular/Lymphatic: No evidence of abdominal aortic aneurysm. No suspicious abdominopelvic lymphadenopathy. Reproductive: Prostate is unremarkable. Other: No abdominopelvic ascites. Musculoskeletal: Visualized osseous structures are within normal limits. IMPRESSION: No evidence of  bowel obstruction.  Prior appendectomy. No CT findings to account for the patient's abdominal pain. 2 mm interpolar right renal calculus. Hepatic steatosis. Hepatic and renal cysts. Electronically Signed   By: Charline Bills M.D.   On: 01/02/2021 21:20    Procedures Procedures   Medications Ordered in ED Medications  sodium chloride 0.9 % bolus 1,000 mL (1,000 mLs Intravenous New Bag/Given 01/02/21 2031)  ondansetron (ZOFRAN) injection 4 mg (4 mg Intravenous Given 01/02/21 2032)  iohexol (OMNIPAQUE) 350 MG/ML injection 80 mL (80 mLs Intravenous Contrast Given 01/02/21 2051)   ED Course  I have reviewed the triage vital signs and the nursing notes.  Pertinent labs & imaging results that were available during my care of the patient were reviewed by me and considered in my medical decision making (see chart for details).  Here for evaluation of intermittent periumbilical abdominal pain over the last 2 months with persistent episodes of almost daily NBNB emesis.  Last bowel movement yesterday, no melena or blood per rectum.  No chronic NSAID use, EtOH use.  His heart and lungs are clear.  No upper respiratory complaints, back pain.  No history of AAA or dissection.  Minimal tenderness to periumbilical region however no overlying skin changes, no rebound or guarding.  He is neurovascularly intact.  Apparently spoke to follow-up with GI for EGD and colonoscopy however did not follow through with appointment.  Work-up started from triage which I personally reviewed and interpreted: CBC with leukocytosis at 13.0 Metabolic panel with some mild hypokalemia at 3.2, CO2 33, glucose 256, creatinine 1.6, previous 1.1 Lipase 56 UA negative for infection CT AP without acute abnormality  Patient reassessed. Discussed labs and imaging. Tolerating PO intake. Unclear etiology of patient symptoms.  Given length of timing over the last few months low suspicion for acute emergent condition, acute surgical  emergency.  Work-up here in the emergency department does not reveal any significant abnormality.  Question  given history of diabetes if patient has some sort of gastroparesis.  Will start on medications to help with his symptoms, and urged to follow-up with gastroenterology.  Patient agreeable.  Patient is nontoxic, nonseptic appearing, in no apparent distress.  Patient's pain and other symptoms adequately managed in emergency department.  Fluid bolus given.  Labs, imaging and vitals reviewed.  Patient does not meet the SIRS or Sepsis criteria.  On repeat exam patient does not have a surgical abdomin and there are no peritoneal signs.  No indication of appendicitis, bowel obstruction, bowel perforation, cholecystitis, diverticulitis, AAA, dissection.  Patient discharged home with symptomatic treatment and given strict instructions for follow-up with their primary care physician.  I have also discussed reasons to return immediately to the ER.  Patient expresses understanding and agrees with plan.     MDM Rules/Calculators/A&P                            Final Clinical Impression(s) / ED Diagnoses Final diagnoses:  Epigastric pain  Nausea    Rx / DC Orders ED Discharge Orders          Ordered    sucralfate (CARAFATE) 1 g tablet  3 times daily with meals & bedtime        01/02/21 2133    pantoprazole (PROTONIX) 20 MG tablet  Daily        01/02/21 2133    metoCLOPramide (REGLAN) 10 MG tablet  Every 12 hours PRN        01/02/21 2133             Viana Sleep A, PA-C 01/02/21 2207    Charlynne Pander, MD 01/06/21 [phone]

## 2021-01-09 ENCOUNTER — Inpatient Hospital Stay
Admission: EM | Admit: 2021-01-09 | Discharge: 2021-01-19 | DRG: 872 | Disposition: A | Discharge status: Home / Self Care | Payer: No Typology Code available for payment source | Attending: Internal Medicine | Admitting: Internal Medicine

## 2021-01-09 ENCOUNTER — Emergency Department: Payer: No Typology Code available for payment source

## 2021-01-09 ENCOUNTER — Other Ambulatory Visit: Payer: Self-pay

## 2021-01-09 DIAGNOSIS — R319 Hematuria, unspecified: Secondary | ICD-10-CM | POA: Diagnosis present

## 2021-01-09 DIAGNOSIS — N281 Cyst of kidney, acquired: Secondary | ICD-10-CM | POA: Diagnosis present

## 2021-01-09 DIAGNOSIS — N200 Calculus of kidney: Secondary | ICD-10-CM | POA: Diagnosis present

## 2021-01-09 DIAGNOSIS — Z7984 Long term (current) use of oral hypoglycemic drugs: Secondary | ICD-10-CM

## 2021-01-09 DIAGNOSIS — F32A Depression, unspecified: Secondary | ICD-10-CM | POA: Diagnosis present

## 2021-01-09 DIAGNOSIS — Z823 Family history of stroke: Secondary | ICD-10-CM

## 2021-01-09 DIAGNOSIS — R269 Unspecified abnormalities of gait and mobility: Secondary | ICD-10-CM | POA: Diagnosis present

## 2021-01-09 DIAGNOSIS — Z87892 Personal history of anaphylaxis: Secondary | ICD-10-CM

## 2021-01-09 DIAGNOSIS — K3184 Gastroparesis: Secondary | ICD-10-CM | POA: Diagnosis present

## 2021-01-09 DIAGNOSIS — Z20822 Contact with and (suspected) exposure to covid-19: Secondary | ICD-10-CM | POA: Diagnosis present

## 2021-01-09 DIAGNOSIS — Z7902 Long term (current) use of antithrombotics/antiplatelets: Secondary | ICD-10-CM | POA: Diagnosis not present

## 2021-01-09 DIAGNOSIS — Z794 Long term (current) use of insulin: Secondary | ICD-10-CM

## 2021-01-09 DIAGNOSIS — E1143 Type 2 diabetes mellitus with diabetic autonomic (poly)neuropathy: Secondary | ICD-10-CM | POA: Diagnosis present

## 2021-01-09 DIAGNOSIS — Z23 Encounter for immunization: Secondary | ICD-10-CM

## 2021-01-09 DIAGNOSIS — E1149 Type 2 diabetes mellitus with other diabetic neurological complication: Secondary | ICD-10-CM | POA: Diagnosis present

## 2021-01-09 DIAGNOSIS — I252 Old myocardial infarction: Secondary | ICD-10-CM

## 2021-01-09 DIAGNOSIS — E1165 Type 2 diabetes mellitus with hyperglycemia: Secondary | ICD-10-CM | POA: Diagnosis present

## 2021-01-09 DIAGNOSIS — B964 Proteus (mirabilis) (morganii) as the cause of diseases classified elsewhere: Secondary | ICD-10-CM | POA: Diagnosis present

## 2021-01-09 DIAGNOSIS — Z833 Family history of diabetes mellitus: Secondary | ICD-10-CM

## 2021-01-09 DIAGNOSIS — Z5901 Sheltered homelessness: Secondary | ICD-10-CM | POA: Diagnosis not present

## 2021-01-09 DIAGNOSIS — IMO0002 Reserved for concepts with insufficient information to code with codable children: Secondary | ICD-10-CM | POA: Diagnosis present

## 2021-01-09 DIAGNOSIS — I2510 Atherosclerotic heart disease of native coronary artery without angina pectoris: Secondary | ICD-10-CM | POA: Diagnosis present

## 2021-01-09 DIAGNOSIS — F17210 Nicotine dependence, cigarettes, uncomplicated: Secondary | ICD-10-CM | POA: Diagnosis present

## 2021-01-09 DIAGNOSIS — I10 Essential (primary) hypertension: Secondary | ICD-10-CM | POA: Diagnosis present

## 2021-01-09 DIAGNOSIS — Z8249 Family history of ischemic heart disease and other diseases of the circulatory system: Secondary | ICD-10-CM

## 2021-01-09 DIAGNOSIS — E876 Hypokalemia: Secondary | ICD-10-CM | POA: Diagnosis present

## 2021-01-09 DIAGNOSIS — Z993 Dependence on wheelchair: Secondary | ICD-10-CM | POA: Diagnosis not present

## 2021-01-09 DIAGNOSIS — R6520 Severe sepsis without septic shock: Secondary | ICD-10-CM | POA: Diagnosis present

## 2021-01-09 DIAGNOSIS — I69398 Other sequelae of cerebral infarction: Secondary | ICD-10-CM

## 2021-01-09 DIAGNOSIS — A4159 Other Gram-negative sepsis: Secondary | ICD-10-CM | POA: Diagnosis present

## 2021-01-09 DIAGNOSIS — A419 Sepsis, unspecified organism: Secondary | ICD-10-CM | POA: Diagnosis present

## 2021-01-09 DIAGNOSIS — F419 Anxiety disorder, unspecified: Secondary | ICD-10-CM | POA: Diagnosis present

## 2021-01-09 DIAGNOSIS — Z7982 Long term (current) use of aspirin: Secondary | ICD-10-CM

## 2021-01-09 DIAGNOSIS — Z79899 Other long term (current) drug therapy: Secondary | ICD-10-CM

## 2021-01-09 DIAGNOSIS — N390 Urinary tract infection, site not specified: Secondary | ICD-10-CM | POA: Diagnosis present

## 2021-01-09 DIAGNOSIS — Z880 Allergy status to penicillin: Secondary | ICD-10-CM

## 2021-01-09 DIAGNOSIS — K219 Gastro-esophageal reflux disease without esophagitis: Secondary | ICD-10-CM | POA: Diagnosis present

## 2021-01-09 LAB — RESP PANEL BY RT-PCR (FLU A&B, COVID) ARPGX2
Influenza A by PCR: NEGATIVE
Influenza B by PCR: NEGATIVE
SARS Coronavirus 2 by RT PCR: NEGATIVE

## 2021-01-09 LAB — URINALYSIS, ROUTINE W REFLEX MICROSCOPIC
Glucose, UA: >1000 mg/dL — AB
Leukocytes,Ua: NEGATIVE
Nitrite: NEGATIVE
Protein, ur: >300 mg/dL — AB
Specific Gravity, Urine: 1.015 (ref 1.005–1.030)
pH: 5.5 (ref 5.0–8.0)

## 2021-01-09 LAB — COMPREHENSIVE METABOLIC PANEL
ALT: 42 U/L (ref 0–44)
AST: 22 U/L (ref 15–41)
Albumin: 4.1 g/dL (ref 3.5–5.0)
Alkaline Phosphatase: 81 U/L (ref 38–126)
Anion gap: 14 (ref 5–15)
BUN: 27 mg/dL — ABNORMAL HIGH (ref 6–20)
CO2: 34 mmol/L — ABNORMAL HIGH (ref 22–32)
Calcium: 10.4 mg/dL — ABNORMAL HIGH (ref 8.9–10.3)
Chloride: 91 mmol/L — ABNORMAL LOW (ref 98–111)
Creatinine, Ser: 1.42 mg/dL — ABNORMAL HIGH (ref 0.61–1.24)
GFR, Estimated: >60 mL/min (ref >60)
Glucose, Bld: 320 mg/dL — ABNORMAL HIGH (ref 70–99)
Potassium: 3.8 mmol/L (ref 3.5–5.1)
Sodium: 139 mmol/L (ref 135–145)
Total Bilirubin: 1 mg/dL (ref 0.3–1.2)
Total Protein: 8.5 g/dL — ABNORMAL HIGH (ref 6.5–8.1)

## 2021-01-09 LAB — CBC WITH DIFFERENTIAL/PLATELET
Abs Immature Granulocytes: 0.08 10*3/uL — ABNORMAL HIGH (ref 0.00–0.07)
Basophils Absolute: 0.1 10*3/uL (ref 0.0–0.1)
Basophils Relative: 1 %
Eosinophils Absolute: 0.1 10*3/uL (ref 0.0–0.5)
Eosinophils Relative: 1 %
HCT: 37.6 % — ABNORMAL LOW (ref 39.0–52.0)
Hemoglobin: 12.8 g/dL — ABNORMAL LOW (ref 13.0–17.0)
Immature Granulocytes: 1 %
Lymphocytes Relative: 11 %
Lymphs Abs: 1.6 10*3/uL (ref 0.7–4.0)
MCH: 30.7 pg (ref 26.0–34.0)
MCHC: 34 g/dL (ref 30.0–36.0)
MCV: 90.2 fL (ref 80.0–100.0)
Monocytes Absolute: 0.9 10*3/uL (ref 0.1–1.0)
Monocytes Relative: 6 %
Neutro Abs: 11.3 10*3/uL — ABNORMAL HIGH (ref 1.7–7.7)
Neutrophils Relative %: 80 %
Platelets: 184 10*3/uL (ref 150–400)
RBC: 4.17 MIL/uL — ABNORMAL LOW (ref 4.22–5.81)
RDW: 12.9 % (ref 11.5–15.5)
WBC: 14 10*3/uL — ABNORMAL HIGH (ref 4.0–10.5)
nRBC: 0 % (ref 0.0–0.2)

## 2021-01-09 LAB — URINALYSIS, MICROSCOPIC (REFLEX)
Bacteria, UA: NONE SEEN
RBC / HPF: >50 RBC/hpf (ref 0–5)
WBC, UA: >50 WBC/hpf (ref 0–5)

## 2021-01-09 LAB — LACTIC ACID, PLASMA
Lactic Acid, Venous: 3.8 mmol/L (ref 0.5–1.9)
Lactic Acid, Venous: 2 mmol/L (ref 0.5–1.9)

## 2021-01-09 LAB — HEMOGLOBIN A1C
Hgb A1c MFr Bld: 10.9 % — ABNORMAL HIGH (ref 4.8–5.6)
Mean Plasma Glucose: 266.13 mg/dL

## 2021-01-09 LAB — PROTIME-INR
INR: 1 (ref 0.8–1.2)
Prothrombin Time: 12.7 seconds (ref 11.4–15.2)

## 2021-01-09 LAB — CBG MONITORING, ED: Glucose-Capillary: 296 mg/dL — ABNORMAL HIGH (ref 70–99)

## 2021-01-09 MED ORDER — SODIUM CHLORIDE 0.9% FLUSH
3.0000 mL | Freq: Two times a day (BID) | INTRAVENOUS | Status: DC
  Administered 2021-01-09 – 2021-01-17 (×16): 3 mL via INTRAVENOUS

## 2021-01-09 MED ORDER — TAMSULOSIN HCL 0.4 MG PO CAPS
0.4000 mg | ORAL_CAPSULE | Freq: Every day | ORAL | Status: DC
  Administered 2021-01-09 – 2021-01-19 (×11): 0.4 mg via ORAL
  Filled 2021-01-09 (×11): qty 1

## 2021-01-09 MED ORDER — OXYCODONE HCL 5 MG PO TABS
5.0000 mg | ORAL_TABLET | Freq: Four times a day (QID) | ORAL | Status: DC | PRN
Start: 2021-01-09 — End: 2021-01-19
  Administered 2021-01-15 (×2): 5 mg via ORAL
  Filled 2021-01-09 (×3): qty 1

## 2021-01-09 MED ORDER — CARVEDILOL 25 MG PO TABS
25.0000 mg | ORAL_TABLET | Freq: Two times a day (BID) | ORAL | Status: DC
  Administered 2021-01-09 – 2021-01-19 (×20): 25 mg via ORAL
  Filled 2021-01-09: qty 1
  Filled 2021-01-09: qty 4
  Filled 2021-01-09 (×2): qty 1
  Filled 2021-01-09: qty 4
  Filled 2021-01-09 (×5): qty 1
  Filled 2021-01-09: qty 4
  Filled 2021-01-09 (×8): qty 1
  Filled 2021-01-09: qty 4

## 2021-01-09 MED ORDER — SODIUM CHLORIDE 0.9 % IV SOLN
2.0000 g | Freq: Two times a day (BID) | INTRAVENOUS | Status: DC
  Administered 2021-01-10: 2 g via INTRAVENOUS
  Filled 2021-01-09: qty 2

## 2021-01-09 MED ORDER — BUPROPION HCL ER (XL) 150 MG PO TB24
150.0000 mg | ORAL_TABLET | Freq: Every day | ORAL | Status: DC
  Administered 2021-01-10 – 2021-01-19 (×10): 150 mg via ORAL
  Filled 2021-01-09 (×10): qty 1

## 2021-01-09 MED ORDER — HYDRALAZINE HCL 20 MG/ML IJ SOLN: 5.0000 mg | INTRAMUSCULAR | Status: DC | PRN

## 2021-01-09 MED ORDER — INSULIN GLARGINE-YFGN 100 UNIT/ML ~~LOC~~ SOLN
20.0000 [IU] | Freq: Every day | SUBCUTANEOUS | Status: DC
  Administered 2021-01-09 – 2021-01-10 (×2): 20 [IU] via SUBCUTANEOUS
  Filled 2021-01-09 (×3): qty 0.2

## 2021-01-09 MED ORDER — LACTATED RINGERS IV SOLN: INTRAVENOUS | Status: DC

## 2021-01-09 MED ORDER — METOCLOPRAMIDE HCL 5 MG PO TABS
10.0000 mg | ORAL_TABLET | Freq: Two times a day (BID) | ORAL | Status: DC | PRN
  Administered 2021-01-12 – 2021-01-13 (×2): 10 mg via ORAL
  Filled 2021-01-09 (×2): qty 2

## 2021-01-09 MED ORDER — SODIUM CHLORIDE 0.9 % IV SOLN
2.0000 g | Freq: Once | INTRAVENOUS | Status: AC
  Administered 2021-01-09: 2 g via INTRAVENOUS
  Filled 2021-01-09: qty 2

## 2021-01-09 MED ORDER — ADULT MULTIVITAMIN W/MINERALS CH
1.0000 | ORAL_TABLET | Freq: Every day | ORAL | Status: DC
  Administered 2021-01-09 – 2021-01-19 (×11): 1 via ORAL
  Filled 2021-01-09 (×11): qty 1

## 2021-01-09 MED ORDER — ACETAMINOPHEN 650 MG RE SUPP: 650.0000 mg | Freq: Four times a day (QID) | RECTAL | Status: DC | PRN

## 2021-01-09 MED ORDER — LACTATED RINGERS IV BOLUS
1000.0000 mL | Freq: Once | INTRAVENOUS | Status: AC
  Administered 2021-01-09: 1000 mL via INTRAVENOUS

## 2021-01-09 MED ORDER — ASPIRIN EC 81 MG PO TBEC
81.0000 mg | DELAYED_RELEASE_TABLET | Freq: Every day | ORAL | Status: DC
  Administered 2021-01-10 – 2021-01-19 (×10): 81 mg via ORAL
  Filled 2021-01-09 (×10): qty 1

## 2021-01-09 MED ORDER — CITALOPRAM HYDROBROMIDE 20 MG PO TABS
40.0000 mg | ORAL_TABLET | Freq: Every day | ORAL | Status: DC
  Administered 2021-01-10 – 2021-01-19 (×10): 40 mg via ORAL
  Filled 2021-01-09 (×10): qty 2

## 2021-01-09 MED ORDER — ATORVASTATIN CALCIUM 20 MG PO TABS
80.0000 mg | ORAL_TABLET | Freq: Every day | ORAL | Status: DC
  Administered 2021-01-09 – 2021-01-19 (×11): 80 mg via ORAL
  Filled 2021-01-09 (×11): qty 4

## 2021-01-09 MED ORDER — INSULIN ASPART 100 UNIT/ML IJ SOLN
0.0000 [IU] | Freq: Three times a day (TID) | INTRAMUSCULAR | Status: DC
  Administered 2021-01-10 (×3): 5 [IU] via SUBCUTANEOUS
  Administered 2021-01-10: 8 [IU] via SUBCUTANEOUS
  Administered 2021-01-11: 3 [IU] via SUBCUTANEOUS
  Administered 2021-01-11: 21:00:00 11 [IU] via SUBCUTANEOUS
  Administered 2021-01-11: 8 [IU] via SUBCUTANEOUS
  Administered 2021-01-11: 15 [IU] via SUBCUTANEOUS
  Administered 2021-01-12 (×3): 3 [IU] via SUBCUTANEOUS
  Administered 2021-01-12: 21:00:00 11 [IU] via SUBCUTANEOUS
  Administered 2021-01-13: 3 [IU] via SUBCUTANEOUS
  Administered 2021-01-13: 8 [IU] via SUBCUTANEOUS
  Administered 2021-01-13 – 2021-01-14 (×2): 3 [IU] via SUBCUTANEOUS
  Administered 2021-01-14 (×3): 5 [IU] via SUBCUTANEOUS
  Administered 2021-01-15: 17:00:00 8 [IU] via SUBCUTANEOUS
  Administered 2021-01-15: 22:00:00 5 [IU] via SUBCUTANEOUS
  Administered 2021-01-15: 13:00:00 3 [IU] via SUBCUTANEOUS
  Administered 2021-01-16: 5 [IU] via SUBCUTANEOUS
  Administered 2021-01-16: 2 [IU] via SUBCUTANEOUS
  Administered 2021-01-16: 13:00:00 3 [IU] via SUBCUTANEOUS
  Administered 2021-01-16: 22:00:00 5 [IU] via SUBCUTANEOUS
  Administered 2021-01-17: 11 [IU] via SUBCUTANEOUS
  Administered 2021-01-17 – 2021-01-18 (×4): 3 [IU] via SUBCUTANEOUS
  Administered 2021-01-18 – 2021-01-19 (×3): 5 [IU] via SUBCUTANEOUS
  Administered 2021-01-19: 13:00:00 3 [IU] via SUBCUTANEOUS
  Filled 2021-01-09 (×34): qty 1

## 2021-01-09 MED ORDER — INSULIN ASPART 100 UNIT/ML IJ SOLN: 0.0000 [IU] | INTRAMUSCULAR | Status: DC

## 2021-01-09 MED ORDER — AMLODIPINE BESYLATE 10 MG PO TABS
10.0000 mg | ORAL_TABLET | Freq: Every day | ORAL | Status: DC
  Administered 2021-01-10 – 2021-01-19 (×10): 10 mg via ORAL
  Filled 2021-01-09 (×3): qty 1
  Filled 2021-01-09 (×2): qty 2
  Filled 2021-01-09 (×5): qty 1

## 2021-01-09 MED ORDER — ACETAMINOPHEN 325 MG PO TABS
650.0000 mg | ORAL_TABLET | Freq: Four times a day (QID) | ORAL | Status: DC | PRN
  Administered 2021-01-11 – 2021-01-15 (×7): 650 mg via ORAL
  Filled 2021-01-09 (×8): qty 2

## 2021-01-09 MED ORDER — PANTOPRAZOLE SODIUM 20 MG PO TBEC
20.0000 mg | DELAYED_RELEASE_TABLET | Freq: Every day | ORAL | Status: DC
  Administered 2021-01-10 – 2021-01-13 (×4): 20 mg via ORAL
  Filled 2021-01-09 (×5): qty 1

## 2021-01-09 NOTE — ED Triage Notes (Signed)
Pt presents to ED with c/o of hematuria. Pt states one episode today of dark red blood. Pt states he does take blood thinners. Pt does states pain on urination. NAD noted.

## 2021-01-09 NOTE — ED Triage Notes (Signed)
First nurse note: pt comes ems from home with blood in urine for the past two hours. Is in plavix for stroke hx. CBG 303. Has not taken insulin yet. 20G L wrist. HR 90 NSR. 98% RA. Aox4.

## 2021-01-09 NOTE — ED Notes (Signed)
Pt voluntarily laying in floor d/t uncomfortable chairs

## 2021-01-09 NOTE — Progress Notes (Signed)
PHARMACY -  BRIEF ANTIBIOTIC NOTE   Pharmacy has received consult(s) for aztreonam for bacteremia from an ED provider.  The patient's profile has been reviewed for ht/wt/allergies/indication/available labs.    One time order(s) placed for aztreonam 2g IV  Further antibiotics/pharmacy consults should be ordered by admitting physician if indicated.                        Harlow Mares, PharmD Clinical Pharmacist  01/09/2021   6:21 PM

## 2021-01-09 NOTE — ED Provider Notes (Signed)
[facility] Department Provider Note ____________________________________________   Event Date/Time   First MD Initiated Contact with Patient 01/09/21 [phone]     (approximate)  I have reviewed the triage vital signs and the nursing notes.   HISTORY  Chief Complaint Hematuria  HPI [name] [name] is a 50 y.o. male with history of diabetes, hypertension, CVA, STEMI, and remaining history as listed below presents to the emergency department for treatment and evaluation of hematuria.  Patient states that yesterday he had some mild pain with urination but this morning pain was 10 out of 10 and he noted blood in his urine.  He noted blood again this afternoon.  When he urinated about 30 minutes ago, he did not have any pain.  No history of kidney stone.  No alleviating measures attempted prior to arrival.         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 12/25/2016   Chronic cerebrovascular accident  (CVA) 12/25/2016    Past Surgical History:  Procedure Laterality Date   APPENDECTOMY     BACK SURGERY     CORONARY/GRAFT ACUTE MI REVASCULARIZATION N/A 01/23/2020   Procedure: Coronary/Graft Acute MI Revascularization;  Surgeon: Alwyn Pea, MD;  Location: [facility] INVASIVE CV LAB;  Service: Cardiovascular;  Laterality: N/A;   LEFT HEART CATH AND CORONARY ANGIOGRAPHY N/A 01/23/2020   Procedure: LEFT HEART CATH AND CORONARY ANGIOGRAPHY;  Surgeon: Alwyn Pea, MD;  Location: [facility] INVASIVE CV LAB;  Service: Cardiovascular;  Laterality: N/A;    Prior to Admission medications   Medication Sig Start Date End Date Taking? Authorizing Provider  amLODipine (NORVASC) 10 MG tablet Take 1 tablet by mouth daily. 12/10/20   [provider]  amLODipine (NORVASC) 5 MG tablet Take 1 tablet (5 mg total) by mouth daily. New blood pressure medication. 01/25/20 04/24/20  Darlin Priestly, MD  APIXABAN Everlene Balls) VTE STARTER PACK (10MG  AND 5MG ) Take as directed on package: start with two-5mg  tablets twice daily for 7 days. On day 8, switch to one-5mg  tablet twice daily. 01/25/20   , MD  aspirin 81 MG EC tablet Take 81 mg by mouth daily. 10/18/20   [provider]  atenolol (TENORMIN) 50 MG tablet Take 50 mg by mouth 2 (two) times daily.     [provider]  atorvastatin (LIPITOR) 80 MG tablet Take 80 mg by mouth daily.    [provider]  buPROPion (WELLBUTRIN XL) 150 MG 24 hr tablet Take 150 mg by mouth daily.    [provider]  carvedilol (COREG) 25 MG tablet Take 25 mg by  mouth in the morning and at bedtime. 07/08/20   [provider]  chlorthalidone (HYGROTON) 25 MG tablet Take 25 mg by mouth daily. 12/07/20   [provider]  citalopram (CELEXA) 40 MG tablet Take 40 mg by mouth daily.    [provider]  clopidogrel (PLAVIX) 75 MG tablet Take 75 mg by mouth daily. 10/18/20   [provider]  ezetimibe (ZETIA) 10 MG tablet Take  10 mg by mouth daily. Patient not taking: Reported on 01/23/2020    [provider]  folic acid (FOLVITE) 1 MG tablet Take 1 mg by mouth daily.    [provider]  insulin aspart (NOVOLOG) 100 UNIT/ML injection Inject 5 Units into the skin 3 (three) times daily with meals. 01/25/20 04/24/20  Darlin Priestly, MD  insulin glargine (LANTUS) 100 UNIT/ML injection Inject 35 Units into the skin at bedtime.     [provider]  liraglutide (VICTOZA) 18 MG/3ML SOPN Inject into the skin daily.     [provider]  losartan (COZAAR) 100 MG tablet Take 100 mg by mouth every evening.    [provider]  metFORMIN (GLUCOPHAGE-XR) 500 MG 24 hr tablet Take 2,000 mg by mouth daily before breakfast.    [provider]  metoCLOPramide (REGLAN) 10 MG tablet Take 1 tablet (10 mg total) by mouth every 12 (twelve) hours as needed for nausea. 01/02/21   Henderly, Britni A, PA-C  Multiple Vitamins-Minerals (CENTRUM FLAVOR BURST ADULT) CHEW Chew 1 each by mouth daily.    [provider]  Multiple Vitamins-Minerals (MULTIVITAMIN ADULT) TABS Take 1 tablet by mouth daily.    [provider]  ondansetron (ZOFRAN ODT) 4 MG disintegrating tablet Take 1 tablet (4 mg total) by mouth every 8 (eight) hours as needed for nausea or vomiting. 06/06/20   Chesley Noon, MD  pantoprazole (PROTONIX) 20 MG tablet Take 1 tablet (20 mg total) by mouth daily. 01/02/21   Henderly, Britni A, PA-C  sucralfate (CARAFATE) 1 g tablet Take 1 tablet (1 g total) by mouth 4 (four) times daily -  with meals and at bedtime for 14 days. 01/02/21 01/16/21  Henderly, Britni A, PA-C  tamsulosin (FLOMAX) 0.4 MG CAPS capsule Take 0.4 mg by mouth 2 (two) times daily.     [provider]  triamterene-hydrochlorothiazide (DYAZIDE) 37.5-25 MG capsule Take 1 capsule by mouth daily.    [provider]    Allergies Penicillins  Family History  Problem Relation Age of Onset   Stroke  Mother    Heart failure Mother    Heart failure Father    Diabetes Father     Social History Social History   Tobacco Use   Smoking status: Every Day    Packs/day: 0.25    Types: Cigarettes   Smokeless tobacco: Never  Vaping Use   Vaping Use: Never used  Substance Use Topics   Alcohol use: No   Drug use: No    Review of Systems  Constitutional: No fever/chills Eyes: No visual changes. ENT: No sore throat. Cardiovascular: Denies chest pain. Respiratory: Denies shortness of breath. Gastrointestinal: No abdominal pain.  No nausea, no vomiting.  No diarrhea.  No constipation. Genitourinary: Negative for dysuria. Musculoskeletal: Negative for back pain. Skin: Negative for rash. Neurological: Negative for headaches, focal weakness or numbness.  ____________________________________________   PHYSICAL EXAM:  VITAL SIGNS: ED Triage Vitals  Enc Vitals Group     BP 01/09/21 1336 (!) 132/99     Pulse Rate  01/09/21 1336 (!) 110     Resp 01/09/21 1336 19     Temp 01/09/21 1336 99.2 F (37.3 C)     Temp Source 01/09/21 1336 Oral     SpO2 01/09/21 1336 94 %     Weight 01/09/21 1337 180 lb (81.6 kg)     Height 01/09/21 1337 5\' 9"  (1.753 m)     Head Circumference --      Peak Flow --      Pain Score 01/09/21 1336 1     Pain Loc --      Pain Edu? --      Excl. in GC? --     Constitutional: Alert and oriented.  Chronically ill appearing and in no acute distress. Eyes: Conjunctivae are normal.  Head: Atraumatic. Nose: No congestion/rhinnorhea. Mouth/Throat: Mucous membranes are moist.  Oropharynx non-erythematous. Neck: No stridor.   Hematological/Lymphatic/Immunilogical: No cervical lymphadenopathy. Cardiovascular: Normal rate, regular rhythm. Grossly normal heart sounds.  Good peripheral circulation. Respiratory: Normal respiratory effort.  No retractions. Lungs CTAB. Gastrointestinal: Soft and nontender. No distention. No abdominal bruits. No CVA  tenderness. Genitourinary:  Musculoskeletal: No lower extremity tenderness nor edema.  No joint effusions. Neurologic:  Normal speech and language. No gross focal neurologic deficits are appreciated. No gait instability. Skin:  Skin is warm, dry and intact. No rash noted. Psychiatric: Mood and affect are normal. Speech and behavior are normal.  ____________________________________________   LABS (all labs ordered are listed, but only abnormal results are displayed)  Labs Reviewed  CBC WITH DIFFERENTIAL/PLATELET - Abnormal; Notable for the following components:      Result Value   WBC 14.0 (*)    RBC 4.17 (*)    Hemoglobin 12.8 (*)    HCT 37.6 (*)    Neutro Abs 11.3 (*)    Abs Immature Granulocytes 0.08 (*)    All other components within normal limits  URINALYSIS, ROUTINE W REFLEX MICROSCOPIC - Abnormal; Notable for the following components:   Color, Urine AMBER (*)    APPearance CLOUDY (*)    Glucose, UA >1,000 (*)    Hgb urine dipstick LARGE (*)    Bilirubin Urine SMALL (*)    Ketones, ur TRACE (*)    Protein, ur >300 (*)    All other components within normal limits  COMPREHENSIVE METABOLIC PANEL - Abnormal; Notable for the following components:   Chloride 91 (*)    CO2 34 (*)    Glucose, Bld 320 (*)    BUN 27 (*)    Creatinine, Ser 1.42 (*)    Calcium 10.4 (*)    Total Protein 8.5 (*)    All other components within normal limits  LACTIC ACID, PLASMA - Abnormal; Notable for the following components:   Lactic Acid, Venous 3.8 (*)    All other components within normal limits  LACTIC ACID, PLASMA - Abnormal; Notable for the following components:   Lactic Acid, Venous 2.0 (*)    All other components within normal limits  RESP PANEL BY RT-PCR (FLU A&B, COVID) ARPGX2  URINE CULTURE  CULTURE, BLOOD (SINGLE)  URINALYSIS, MICROSCOPIC (REFLEX)  PROTIME-INR  HEMOGLOBIN A1C   ____________________________________________  EKG  ED ECG REPORT I, Taylon Louison, FNP-BC  personally viewed and interpreted this ECG.   Date: 01/09/2021  EKG Time: 1342  Rate: 81  Rhythm: normal EKG, normal sinus rhythm  Axis: normal  Intervals:none  ST&T Change: no ST elevation  ____________________________________________  RADIOLOGY  ED MD interpretation:    Punctate  bilateral nonobstructive renal calculi on CT of the abdomen and pelvis. Chronic findings without concerning changes.   I, Kem Boroughs, personally viewed and evaluated these images (plain radiographs) as part of my medical decision making, as well as reviewing the written report by the radiologist.  Official radiology report(s): DG Chest 1 View  Result Date: 01/09/2021 CLINICAL DATA:  Tachycardia.  Lactic elevated.  Hematuria. EXAM: CHEST  1 VIEW COMPARISON:  06/06/2020 FINDINGS: Shallow inspiration. Heart size and pulmonary vascularity are normal. Minimal linear atelectasis suggested in the left base. No focal consolidation or airspace disease. No pleural effusions. No pneumothorax. Mediastinal contours appear intact. A loop recorder is present. IMPRESSION: Minimal linear atelectasis in the left base. No focal consolidation. Electronically Signed   By: Burman Nieves M.D.   On: 01/09/2021 19:03   CT Renal Stone Study  Result Date: 01/09/2021 CLINICAL DATA:  Hematuria. The patient takes Plavix for prior stroke. EXAM: CT ABDOMEN AND PELVIS WITHOUT CONTRAST TECHNIQUE: Multidetector CT imaging of the abdomen and pelvis was performed following the standard protocol without IV contrast. COMPARISON:  01/02/2021 CT abdomen/pelvis with contrast from [facility]. FINDINGS: Lower chest: The bottom of a loop recorder is visible on the scout image. The lung bases appear clear. Hepatobiliary: Diffuse hepatic steatosis. Minimal sparing along the gallbladder fossa. Calcification adjacent to a small hypodense subcapsular lesion posteriorly in the right hepatic lobe on image 18 series 2 unchanged from prior. Pancreas:  Unremarkable Spleen: Unremarkable Adrenals/Urinary Tract: Both adrenal glands appear normal. Stable 3 mm right mid to lower kidney nonobstructive renal calculus on image 81 series 5. Suspected 1-2 mm nonobstructive left mid kidney calculus. Complex left renal cystic lesion with a higher density marginal element, bilobed appearance with a septation, and some calcification along the inferior margin of the dominant exophytic portion. Today's exam is performed without IV contrast and a Bosniak classification cannot be assigned. Stomach/Bowel: Prominent stool throughout the colon favors constipation. Wall thickening in the lower rectum on image 88 of series 2 was not present 1 week ago and accordingly is probably peristaltic or incidental. Appendix surgically absent. Vascular/Lymphatic: Generally mild atherosclerotic calcifications are observed. No pathologic adenopathy. Reproductive: Unremarkable Other: No supplemental non-categorized findings. 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 IMPRESSION: 1. Single punctate bilateral nonobstructive renal calculi. 2. Complex cyst of the left mid kidney roughly similar to prior exams, noncontrast CT is not suitable for assigning a Bosniak classification. If further workup of this lesion is warranted, I would suggest renal protocol MRI with and without contrast on a elective basis. 3. Diffuse hepatic steatosis. 4.  Prominent stool throughout the colon favors constipation. 5. Mild atherosclerotic calcifications. 6. 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 Electronically Signed   By: Gaylyn Rong M.D.   On: 01/09/2021 17:00    ____________________________________________   PROCEDURES  Procedure(s) performed (including Critical Care):  Procedures  ____________________________________________   INITIAL IMPRESSION / ASSESSMENT AND PLAN     50 year old male presenting to the emergency department for  treatment and evaluation of hematuria.  See HPI for further details.  While awaiting ER room assignment, labs and urinalysis were collected.  Urine is cloudy with large amount of glucose, large amount of hemoglobin, trace of ketones, large amount of protein but nitrate negative leukocytes negative and no bacteria.  CBC indicates a leukocytosis.  BMP shows a glucose of 320 with a normal anion gap.  BUN and creatinine are elevated at 27 and  1.4 however this appears to be patient's baseline or better.  He does have a mild fever at 99.2 and is tachycardic at 110.  Due to leukocytosis and vitals, lactic acid ordered.  We will give him a liter of lactated Ringer's.  We will also get a CT of the abdomen and pelvis to rule out kidney stone.  Patient is aware and agreeable to the plan.  DIFFERENTIAL DIAGNOSIS  Nephrolithiasis, ureterolithiasis, interstitial cystitis, sepsis, pyelonephritis  ED COURSE  Lactic acid 3.8. Fluids currently infusing. Unknown source of infection, likely cystitis or pyelonephritis based on presenting symptoms. Azactam ordered--sever PCN allergy documented. Will get CXR and admit. COVID screening in process.  ----------------------------------------- 7:08 PM on 01/09/2021 ----------------------------------------- Patient accepted for admission.  CRITICAL CARE Performed by: Kem Boroughs   Total critical care time: 45 minutes  Critical care time was exclusive of separately billable procedures and treating other patients.  Critical care was necessary to treat or prevent imminent or life-threatening deterioration.  Critical care was time spent personally by me on the following activities: development of treatment plan with patient and/or surrogate as well as nursing, discussions with consultants, evaluation of patient's response to treatment, examination of patient, obtaining history from patient or surrogate, ordering and performing treatments and interventions, ordering  and review of laboratory studies, ordering and review of radiographic studies, pulse oximetry and re-evaluation of patient's condition.     ___________________________________________   FINAL CLINICAL IMPRESSION(S) / ED DIAGNOSES  Final diagnoses:  Sepsis without acute organ dysfunction, due to unspecified organism [facility])     ED Discharge Orders     None        [name] [name] was evaluated in Emergency Department on 01/09/2021 for the symptoms described in the history of present illness. He was evaluated in the context of the global COVID-19 pandemic, which necessitated consideration that the patient might be at risk for infection with the SARS-CoV-2 virus that causes COVID-19. Institutional protocols and algorithms that pertain to the evaluation of patients at risk for COVID-19 are in a state of rapid change based on information released by regulatory bodies including the CDC and federal and state organizations. These policies and algorithms were followed during the patient's care in the ED.   Note:  This document was prepared using Dragon voice recognition software and may include unintentional dictation errors.    Chinita Pester, FNP 01/09/21 1909    Gilles Chiquito, MD 01/09/21 [phone]

## 2021-01-09 NOTE — H&P (Addendum)
History and Physical    [name] [name] WUJ:[id number] DOB: 03/17/1971 DOA: 01/09/2021  Referring MD/NP/PA:   PCP: [facility]   Patient coming from:  The patient is coming from home.  At baseline, pt is independent for most of ADL.        Chief Complaint: Dysuria, hematuria  HPI: [name] [name] is a 50 y.o. male with medical history significant of STEMI, CVA, hypertension, diabetes on insulin, ? Gastroparesis,  anxiety/depression presented with dysuria, hematuria,  When I saw patient, he is alert and oriented, this is a [place] patient, per patient, chart review, patient states he had mild dysuria yesterday, this morning when he urinates he feels pain 10 out of 10 and noted blood in his urine, noted blood again this afternoon, when he urinated the significant pain resolved.  He went to the ED.  Patient reports no dizziness, fever, chills, chest pain, palpitation, shortness of breath, cough, abdominal pain,  nausea, vomiting, diarrhea, leg swelling.   ED Course: pt was found to have ED, patient was found T-max 99.2, heart rate 110, respiratory 19, blood pressure 132/99, oxygen saturation 94% on room air.  Labs shows bicarb 34, BUN 27, creatinine 1.42, glucose 320, calcium 10.4, lactic acid 3.8-2.0, WBC 14.0 hemoglobin 12.8, neutrophils 80% INR 1.0, COVID flu negative, flu negative, UA shows negative for leukocyte negative for nitrates but WBC more than 50 CT abdomen stone study: 1. Single punctate bilateral nonobstructive renal calculi. 2. Complex cyst of the left mid kidney roughly similar to prior exams, noncontrast CT is not suitable for assigning a Bosniak classification. If further workup of this lesion is warranted, I would suggest renal protocol MRI with and without contrast on a elective basis. 3. Diffuse hepatic steatosis. 4.  Prominent stool throughout the colon favors constipation. 5. Mild atherosclerotic calcifications. 6. 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  ED patient was give aztreonam 2 g, insulin, LR Hospitalist was requested to admit patient    Review of Systems:   General: no  chills, no body weight gain,  HEENT: no blurry vision, hearing changes or sore throat Respiratory: no dyspnea, coughing, wheezing CV: no chest pain, no palpitations GI: no nausea, vomiting, abdominal pain, diarrhea, constipation GU: see HPI Ext: no leg edema Neuro: no unilateral weakness, numbness, or tingling, no vision change or hearing loss Skin: no rash, no skin tear. MSK: No muscle spasm, no deformity, no limitation of range of movement in spin Heme: No easy bruising.  Travel history: No recent long distant travel.  Allergy:  Allergies  Allergen Reactions   Penicillins Other (See Comments) and Anaphylaxis    Has patient had a PCN reaction causing immediate rash, facial/tongue/throat swelling, SOB or lightheadedness with hypotension: Yes Has patient had a PCN reaction causing severe rash involving mucus membranes or skin necrosis: No Has patient had a PCN reaction that required hospitalization: No Has patient had a PCN reaction occurring within the last 10 years: No If all of the above answers are "NO", then may proceed with Cephalosporin use.     Past Medical History:  Diagnosis Date   Diabetes mellitus without complication (HCC)    Hypertension    Stroke [facility])     Past Surgical History:  Procedure Laterality Date   APPENDECTOMY     BACK SURGERY     CORONARY/GRAFT ACUTE MI REVASCULARIZATION N/A 01/23/2020   Procedure: Coronary/Graft Acute MI Revascularization;  Surgeon: Alwyn Pea, MD;  Location: [facility] INVASIVE  CV LAB;  Service: Cardiovascular;  Laterality: N/A;   LEFT HEART CATH AND CORONARY ANGIOGRAPHY N/A 01/23/2020   Procedure: LEFT HEART CATH AND CORONARY ANGIOGRAPHY;  Surgeon: Alwyn Pea, MD;  Location: [facility] INVASIVE CV LAB;  Service: Cardiovascular;  Laterality: N/A;    Social History:  reports that he has  been smoking cigarettes. He has been smoking an average of .25 packs per day. He has never used smokeless tobacco. He reports that he does not drink alcohol and does not use drugs.  Family History:  Family History  Problem Relation Age of Onset   Stroke Mother    Heart failure Mother    Heart failure Father    Diabetes Father      Prior to Admission medications   Medication Sig Start Date End Date Taking? Authorizing Provider  amLODipine (NORVASC) 10 MG tablet Take 1 tablet by mouth daily. 12/10/20   [provider]  amLODipine (NORVASC) 5 MG tablet Take 1 tablet (5 mg total) by mouth daily. New blood pressure medication. 01/25/20 04/24/20  Darlin Priestly, MD  APIXABAN Everlene Balls) VTE STARTER PACK (10MG  AND 5MG ) Take as directed on package: start with two-5mg  tablets twice daily for 7 days. On day 8, switch to one-5mg  tablet twice daily. 01/25/20   , MD  aspirin 81 MG EC tablet Take 81 mg by mouth daily. 10/18/20   [provider]  atenolol (TENORMIN) 50 MG tablet Take 50 mg by mouth 2 (two) times daily.     [provider]  atorvastatin (LIPITOR) 80 MG tablet Take 80 mg by mouth daily.    [provider]  buPROPion (WELLBUTRIN XL) 150 MG 24 hr tablet Take 150 mg by mouth daily.    [provider]  carvedilol (COREG) 25 MG tablet Take 25 mg by mouth in the morning and at bedtime. 07/08/20   [provider]  chlorthalidone (HYGROTON) 25 MG tablet Take 25 mg by mouth daily. 12/07/20   [provider]  citalopram (CELEXA) 40 MG tablet Take 40 mg by mouth daily.    [provider]  clopidogrel (PLAVIX) 75 MG tablet Take 75 mg by mouth daily. 10/18/20   [provider]  ezetimibe (ZETIA) 10 MG tablet Take 10 mg by mouth daily. Patient not taking: Reported on 01/23/2020    [provider]  folic acid (FOLVITE) 1 MG tablet Take 1 mg by mouth daily.    [provider]  insulin aspart (NOVOLOG) 100 UNIT/ML  injection Inject 5 Units into the skin 3 (three) times daily with meals. 01/25/20 04/24/20  03/26/20, MD  insulin glargine (LANTUS) 100 UNIT/ML injection Inject 35 Units into the skin at bedtime.     [provider]  liraglutide (VICTOZA) 18 MG/3ML SOPN Inject into the skin daily.     [provider]  losartan (COZAAR) 100 MG tablet Take 100 mg by mouth every evening.    [provider]  metFORMIN (GLUCOPHAGE-XR) 500 MG 24 hr tablet Take 2,000 mg by mouth daily before breakfast.    [provider]  metoCLOPramide (REGLAN) 10 MG tablet Take 1 tablet (10 mg total) by mouth every 12 (twelve) hours as needed for nausea. 01/02/21   Henderly, Britni A, PA-C  Multiple Vitamins-Minerals (CENTRUM FLAVOR BURST ADULT) CHEW Chew 1 each by mouth daily.    [provider]  Multiple Vitamins-Minerals (MULTIVITAMIN ADULT) TABS Take 1 tablet by mouth daily.    [provider]  ondansetron (ZOFRAN ODT)  4 MG disintegrating tablet Take 1 tablet (4 mg total) by mouth every 8 (eight) hours as needed for nausea or vomiting. 06/06/20   Chesley Noon, MD  pantoprazole (PROTONIX) 20 MG tablet Take 1 tablet (20 mg total) by mouth daily. 01/02/21   Henderly, Britni A, PA-C  sucralfate (CARAFATE) 1 g tablet Take 1 tablet (1 g total) by mouth 4 (four) times daily -  with meals and at bedtime for 14 days. 01/02/21 01/16/21  Henderly, Britni A, PA-C  tamsulosin (FLOMAX) 0.4 MG CAPS capsule Take 0.4 mg by mouth 2 (two) times daily.     [provider]  triamterene-hydrochlorothiazide (DYAZIDE) 37.5-25 MG capsule Take 1 capsule by mouth daily.    [provider]    Physical Exam: Vitals:   01/09/21 1336 01/09/21 1337  BP: (!) 132/99   Pulse: (!) 110   Resp: 19   Temp: 99.2 F (37.3 C)   TempSrc: Oral   SpO2: 94%   Weight:  81.6 kg  Height:  5\' 9"  (1.753 m)   General: Not in acute distress HEENT:       Eyes: PERRL, EOMI, no scleral icterus.       ENT: No  discharge from the ears and nose, no pharynx injection, no tonsillar enlargement.        Neck: No JVD, no bruit, no mass felt. Heme: No neck lymph node enlargement. Cardiac: S1/S2, RRR, No murmurs, No gallops or rubs. Respiratory: Good air movement bilaterally. No rales, wheezing, rhonchi or rubs. GI: Soft, nondistended, nontender, no rebound pain, no organomegaly, BS present. GU: hematuria Ext: No pitting leg edema bilaterally. 2+DP/PT pulse bilaterally. Musculoskeletal: No joint deformities, No joint redness or warmth, no limitation of ROM in spin. Skin: No rashes.  Neuro: Alert, oriented X3, cranial nerves II-XII grossly intact, moves all extremities normally. Muscle strength 5/5 in all extremities, sensation to light touch intact. Brachial reflex 2+ bilaterally. Knee reflex 1+ bilaterally. Negative Babinski's sign. Normal finger to nose test. Psych: Patient is not psychotic, no suicidal or hemocidal ideation.  Labs on Admission: I have personally reviewed following labs and imaging studies  CBC: Recent Labs  Lab 01/09/21 1340  WBC 14.0*  NEUTROABS 11.3*  HGB 12.8*  HCT 37.6*  MCV 90.2  PLT 184   Basic Metabolic Panel: Recent Labs  Lab 01/09/21 1441  NA 139  K 3.8  CL 91*  CO2 34*  GLUCOSE 320*  BUN 27*  CREATININE 1.42*  CALCIUM 10.4*   GFR: Estimated Creatinine Clearance: 63.6 mL/min (A) (by C-G formula based on SCr of 1.42 mg/dL (H)). Liver Function Tests: Recent Labs  Lab 01/09/21 1441  AST 22  ALT 42  ALKPHOS 81  BILITOT 1.0  PROT 8.5*  ALBUMIN 4.1   No results for input(s): LIPASE, AMYLASE in the last 168 hours. No results for input(s): AMMONIA in the last 168 hours. Coagulation Profile: Recent Labs  Lab 01/09/21 1821  INR 1.0   Cardiac Enzymes: No results for input(s): CKTOTAL, CKMB, CKMBINDEX, TROPONINI in the last 168 hours. BNP (last 3 results) No results for input(s): PROBNP in the last 8760 hours. HbA1C: No results for input(s): HGBA1C  in the last 72 hours. CBG: No results for input(s): GLUCAP in the last 168 hours. Lipid Profile: No results for input(s): CHOL, HDL, LDLCALC, TRIG, CHOLHDL, LDLDIRECT in the last 72 hours. Thyroid Function Tests: No results for input(s): TSH, T4TOTAL, FREET4, T3FREE, THYROIDAB in the last 72 hours. Anemia Panel: No results for input(s):  VITAMINB12, FOLATE, FERRITIN, TIBC, IRON, RETICCTPCT in the last 72 hours. Urine analysis:    Component Value Date/Time   COLORURINE AMBER (A) 01/09/2021 1525   APPEARANCEUR CLOUDY (A) 01/09/2021 1525   LABSPEC 1.015 01/09/2021 1525   PHURINE 5.5 01/09/2021 1525   GLUCOSEU >1,000 (A) 01/09/2021 1525   HGBUR LARGE (A) 01/09/2021 1525   BILIRUBINUR SMALL (A) 01/09/2021 1525   KETONESUR TRACE (A) 01/09/2021 1525   PROTEINUR >300 (A) 01/09/2021 1525   NITRITE NEGATIVE 01/09/2021 1525   LEUKOCYTESUR NEGATIVE 01/09/2021 1525   Sepsis Labs: @LABRCNTIP (procalcitonin:4,lacticidven:4) ) Recent Results (from the past 240 hour(s))  Resp Panel by RT-PCR (Flu A&B, Covid) Nasopharyngeal Swab     Status: None   Collection Time: 01/09/21  3:27 PM   Specimen: Nasopharyngeal Swab; Nasopharyngeal(NP) swabs in vial transport medium  Result Value Ref Range Status   SARS Coronavirus 2 by RT PCR NEGATIVE NEGATIVE Final    Comment: (NOTE) SARS-CoV-2 target nucleic acids are NOT DETECTED.  The SARS-CoV-2 RNA is generally detectable in upper respiratory specimens during the acute phase of infection. The lowest concentration of SARS-CoV-2 viral copies this assay can detect is 138 copies/mL. A negative result does not preclude SARS-Cov-2 infection and should not be used as the sole basis for treatment or other patient management decisions. A negative result may occur with  improper specimen collection/handling, submission of specimen other than nasopharyngeal swab, presence of viral mutation(s) within the areas targeted by this assay, and inadequate number of  viral copies(<138 copies/mL). A negative result must be combined with clinical observations, patient history, and epidemiological information. The expected result is Negative.  Fact Sheet for Patients:  01/11/21  Fact Sheet for Healthcare Providers:  BloggerCourse.com  This test is no t yet approved or cleared by the [employer]iousBroker.[place] FDA and  has been authorized for detection and/or diagnosis of SARS-CoV-2 by FDA under an Emergency Use Authorization (EUA). This EUA will remain  in effect (meaning this test can be used) for the duration of the COVID-19 declaration under Section 564(b)(1) of the Act, 21 U.S.C.section 360bbb-3(b)(1), unless the authorization is terminated  or revoked sooner.       Influenza A by PCR NEGATIVE NEGATIVE Final   Influenza B by PCR NEGATIVE NEGATIVE Final    Comment: (NOTE) The Xpert Xpress SARS-CoV-2/FLU/RSV plus assay is intended as an aid in the diagnosis of influenza from Nasopharyngeal swab specimens and should not be used as a sole basis for treatment. Nasal washings and aspirates are unacceptable for Xpert Xpress SARS-CoV-2/FLU/RSV testing.  Fact Sheet for Patients: [place]  Fact Sheet for Healthcare Providers: BloggerCourse.com  This test is not yet approved or cleared by the SeriousBroker.it FDA and has been authorized for detection and/or diagnosis of SARS-CoV-2 by FDA under an Emergency Use Authorization (EUA). This EUA will remain in effect (meaning this test can be used) for the duration of the COVID-19 declaration under Section 564(b)(1) of the Act, 21 U.S.C. section 360bbb-3(b)(1), unless the authorization is terminated or revoked.  Performed at [facility], [address], [place], [place]      Radiological Exams on Admission: CT Renal Stone Study  Result Date: 01/09/2021 CLINICAL DATA:   Hematuria. The patient takes Plavix for prior stroke. EXAM: CT ABDOMEN AND PELVIS WITHOUT CONTRAST TECHNIQUE: Multidetector CT imaging of the abdomen and pelvis was performed following the standard protocol without IV contrast. COMPARISON:  01/02/2021 CT abdomen/pelvis with contrast from [facility]. FINDINGS: Lower chest: The bottom of  a loop recorder is visible on the scout image. The lung bases appear clear. Hepatobiliary: Diffuse hepatic steatosis. Minimal sparing along the gallbladder fossa. Calcification adjacent to a small hypodense subcapsular lesion posteriorly in the right hepatic lobe on image 18 series 2 unchanged from prior. Pancreas: Unremarkable Spleen: Unremarkable Adrenals/Urinary Tract: Both adrenal glands appear normal. Stable 3 mm right mid to lower kidney nonobstructive renal calculus on image 81 series 5. Suspected 1-2 mm nonobstructive left mid kidney calculus. Complex left renal cystic lesion with a higher density marginal element, bilobed appearance with a septation, and some calcification along the inferior margin of the dominant exophytic portion. Today's exam is performed without IV contrast and a Bosniak classification cannot be assigned. Stomach/Bowel: Prominent stool throughout the colon favors constipation. Wall thickening in the lower rectum on image 88 of series 2 was not present 1 week ago and accordingly is probably peristaltic or incidental. Appendix surgically absent. Vascular/Lymphatic: Generally mild atherosclerotic calcifications are observed. No pathologic adenopathy. Reproductive: Unremarkable Other: No supplemental non-categorized findings. 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 IMPRESSION: 1. Single punctate bilateral nonobstructive renal calculi. 2. Complex cyst of the left mid kidney roughly similar to prior exams, noncontrast CT is not suitable for assigning a Bosniak classification. If further workup of this  lesion is warranted, I would suggest renal protocol MRI with and without contrast on a elective basis. 3. Diffuse hepatic steatosis. 4.  Prominent stool throughout the colon favors constipation. 5. Mild atherosclerotic calcifications. 6. 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 Electronically Signed   By: Gaylyn Rong M.D.   On: 01/09/2021 17:00     EKG: Independently reviewed.  Not done in ED, will get one.   Assessment/Plan Principal Problem:   Sepsis (HCC) Active Problems:   Essential hypertension, benign   Type II diabetes mellitus with neurological manifestations, uncontrolled (HCC)   Acute lower UTI   Hematuria   Sepsis:  Evidence of tachycardia, leukocytosis secondary to likely UTI 30 ml/kg IVF bolus ordered in the ED  Lactate downtrending 3.8-2.0 Empirically Antibiotics aztreonam Ordered in ED, will continue Blood Culture Pending urine Culture Pending Holding blood pressure medications: Hold losartan, diuresis including chlorthalidone, Dyazide now  #UTI: e/o symptoms, UA  treat with IV aztreonam in the ED, will continue -Waiting urine culture and adjust abx according to sensitivities -IVF if needed  #Hematuria, kidney stone: Gross hematuria, etiology likely multifactorial, ? passing kidney stone and on dual antiplatelets Hemoglobin stable CT shows nonobstructive small renal stone, question if patient passed a small stone Close monitoring, urology consult if needed Patient on dual antiplatelets for CVA prophylaxis, will hold Plavix, continue aspirin, if not improving will hold aspirin  #Hypertension: Holding blood pressure medications given sepsis, see above Hydralazine as needed  #Diabetes, will continue lower dose long-acting plus sliding scale insulin  #history significant of STEMI, CVA, hypertension, diabetes on insulin, ? Gastroparesis,  anxiety/depression presented with dysuria, hematuria, Established Active problems see above Continue home  medications if appropriate follow-up with his PCP, specialist when necessary      DVT ppx:      scd Code Status: Full code Family Communication:           Yes, patient's    at bed side Disposition Plan:  Anticipate discharge back to previous home environment Consults called:   Admission status: Obs / tele  Inpatient/tele   medical floor/obs     SDU/inpation  Date of Service 01/09/2021    Rayne Du [facility]   If 7PM-7AM, please contact night-coverage [email or web address] Password [facility] 01/09/2021, 6:49 PM

## 2021-01-10 DIAGNOSIS — R319 Hematuria, unspecified: Secondary | ICD-10-CM | POA: Diagnosis not present

## 2021-01-10 DIAGNOSIS — N390 Urinary tract infection, site not specified: Secondary | ICD-10-CM | POA: Diagnosis not present

## 2021-01-10 DIAGNOSIS — I10 Essential (primary) hypertension: Secondary | ICD-10-CM | POA: Diagnosis not present

## 2021-01-10 DIAGNOSIS — A419 Sepsis, unspecified organism: Secondary | ICD-10-CM | POA: Diagnosis not present

## 2021-01-10 LAB — CBG MONITORING, ED
Glucose-Capillary: 275 mg/dL — ABNORMAL HIGH (ref 70–99)
Glucose-Capillary: 243 mg/dL — ABNORMAL HIGH (ref 70–99)
Glucose-Capillary: 219 mg/dL — ABNORMAL HIGH (ref 70–99)
Glucose-Capillary: 247 mg/dL — ABNORMAL HIGH (ref 70–99)
Glucose-Capillary: 286 mg/dL — ABNORMAL HIGH (ref 70–99)

## 2021-01-10 LAB — BASIC METABOLIC PANEL WITH GFR
Anion gap: 11 (ref 5–15)
BUN: 22 mg/dL — ABNORMAL HIGH (ref 6–20)
CO2: 31 mmol/L (ref 22–32)
Calcium: 9.3 mg/dL (ref 8.9–10.3)
Chloride: 91 mmol/L — ABNORMAL LOW (ref 98–111)
Creatinine, Ser: 1.18 mg/dL (ref 0.61–1.24)
GFR, Estimated: >60 mL/min
Glucose, Bld: 222 mg/dL — ABNORMAL HIGH (ref 70–99)
Potassium: 3.1 mmol/L — ABNORMAL LOW (ref 3.5–5.1)
Sodium: 133 mmol/L — ABNORMAL LOW (ref 135–145)

## 2021-01-10 LAB — CBC
HCT: 43.8 % (ref 39.0–52.0)
Hemoglobin: 15.2 g/dL (ref 13.0–17.0)
MCH: 30.2 pg (ref 26.0–34.0)
MCHC: 34.7 g/dL (ref 30.0–36.0)
MCV: 86.9 fL (ref 80.0–100.0)
Platelets: 221 10*3/uL (ref 150–400)
RBC: 5.04 MIL/uL (ref 4.22–5.81)
RDW: 11.9 % (ref 11.5–15.5)
WBC: 20.1 10*3/uL — ABNORMAL HIGH (ref 4.0–10.5)
nRBC: 0 % (ref 0.0–0.2)

## 2021-01-10 LAB — BASIC METABOLIC PANEL
Anion gap: 13 (ref 5–15)
Anion gap: 11 (ref 5–15)
BUN: 25 mg/dL — ABNORMAL HIGH (ref 6–20)
BUN: 24 mg/dL — ABNORMAL HIGH (ref 6–20)
CO2: 29 mmol/L (ref 22–32)
CO2: 32 mmol/L (ref 22–32)
Calcium: 9.7 mg/dL (ref 8.9–10.3)
Calcium: 9.4 mg/dL (ref 8.9–10.3)
Chloride: 93 mmol/L — ABNORMAL LOW (ref 98–111)
Chloride: 92 mmol/L — ABNORMAL LOW (ref 98–111)
Creatinine, Ser: 1.26 mg/dL — ABNORMAL HIGH (ref 0.61–1.24)
Creatinine, Ser: 1.26 mg/dL — ABNORMAL HIGH (ref 0.61–1.24)
GFR, Estimated: >60 mL/min (ref >60)
GFR, Estimated: >60 mL/min (ref >60)
Glucose, Bld: 254 mg/dL — ABNORMAL HIGH (ref 70–99)
Glucose, Bld: 255 mg/dL — ABNORMAL HIGH (ref 70–99)
Potassium: 2.8 mmol/L — ABNORMAL LOW (ref 3.5–5.1)
Potassium: 3 mmol/L — ABNORMAL LOW (ref 3.5–5.1)
Sodium: 135 mmol/L (ref 135–145)
Sodium: 135 mmol/L (ref 135–145)

## 2021-01-10 LAB — HIV ANTIBODY (ROUTINE TESTING W REFLEX): HIV Screen 4th Generation wRfx: NONREACTIVE

## 2021-01-10 MED ORDER — SODIUM CHLORIDE 0.9 % IV SOLN
2.0000 g | Freq: Three times a day (TID) | INTRAVENOUS | Status: AC
  Administered 2021-01-10 – 2021-01-11 (×4): 2 g via INTRAVENOUS
  Filled 2021-01-10 (×7): qty 2

## 2021-01-10 NOTE — ED Notes (Signed)
Provided full linen change and repositioned in bed.

## 2021-01-10 NOTE — Progress Notes (Addendum)
Inpatient Diabetes Program Recommendations  AACE/ADA: New Consensus Statement on Inpatient Glycemic Control   Target Ranges:  Prepandial:   less than 140 mg/dL      Peak postprandial:   less than 180 mg/dL (1-2 hours)      Critically ill patients:  140 - 180 mg/dL   Results for ZYLER, HYSON (MRN [id number]) as of 01/10/2021 11:07  Ref. Range 01/09/2021 22:41 01/10/2021 04:48 01/10/2021 07:58  Glucose-Capillary Latest Ref Range: 70 - 99 mg/dL 299 (H) 242 (H) 683 (H)   Results for [name] [name] (MRN [id number]) as of 01/10/2021 15:21  Ref. Range 01/09/2021 13:40  Hemoglobin A1C Latest Ref Range: 4.8 - 5.6 % 10.9 (H)   Review of Glycemic Control  Diabetes history: DM2 Outpatient Diabetes medications: Lantus 40 units QHS, Novolog 10 units TID with meals, Jardiance 1/2 pill daily Current orders for Inpatient glycemic control: Novolog 0-15 units TID with meals and bedtime, Semglee 20 units QHS  Inpatient Diabetes Program Recommendations:    Insulin: Please consider increasing Semglee to 30 units QHS.  Addendum 01/10/21@14 :05-Spoke with patient about diabetes and home regimen for diabetes control. Patient reports being followed by Endocrinologist at [facility] for diabetes management. Patient states that he is taking Lantus 40 units QHS, Novolog 10 units before meals, and Jardiance 1/2 pill daily (not sure of dose).  Patient reports that he is consistently taking DM medications. He states that he had been on Victoza which was stopped due to vomiting and Metformin was stopped when he started on insulin several years ago. Patient states that Lantus and Novolog dose was increased about 1 month ago. Patient reports that glucose is 200-300's mg/dl consistently.  Inquired about prior A1C and patient reports not being able to recall last A1C value. Discussed A1C results (10.9% on 01/09/21) and explained that current A1C indicates an average glucose of 266 mg/dl over the past 2-3 months. Discussed glucose and A1C  goals. Discussed importance of checking CBGs and maintaining good CBG control to prevent long-term and short-term complications. Explained how hyperglycemia leads to damage within blood vessels which lead to the common complications seen with uncontrolled diabetes. Stressed to the patient the importance of improving glycemic control to prevent further complications from uncontrolled diabetes. Patient states that he is very concerned about his diabetes and the impact it has on his health. Patient reports he has had CVAs and the last one in 2017 left him with several deficits and he is now wheelchair bound. Patient reports that he is not able to get to appointments and most of his care is done by televisits since he is unable to drive and he does not have someone to take him. Patient states that he tries to follow medication regimen exactly as prescribed but he still has not seen that his DM has improved.   Encouraged patient to reach out to providers at [facility] to discuss DM control and inquire about adjustments. Discussed FreeStyle Libre and patient states he has asked his providers at [facility] for a [employer] [facility] but it has not been approved for him yet. Encouraged patient to reach back out and ask about about the [facility] as it would provide more information on glucose trends and providers could use the information to make adjustments with DM medications.  Patient verbalized understanding of information discussed and reports no further questions at this time related to diabetes.   Thanks, Orlando Penner, RN, MSN, CDE Diabetes Coordinator Inpatient Diabetes Program [phone] (Team Pager from 8am to  5pm)

## 2021-01-10 NOTE — ED Notes (Signed)
Pt repositioned in bed.

## 2021-01-10 NOTE — ED Notes (Signed)
Pt missed urinal, pericare provided, linens changed

## 2021-01-10 NOTE — Progress Notes (Addendum)
PROGRESS NOTE  [name] HKV:[id number] DOB: 18-Apr-1971 DOA: 01/09/2021 PCP: [facility]   LOS: 1 day   Brief narrative: [name] [name] is a 50 y.o. male with past medical history of ST elevation MI, CVA, hypertension, diabetes mellitus on insulin, gastroparesis, anxiety depression presented to hospital with dysuria, hematuria without obvious fever but had some chills.  In the ED, patient had mild grade fever at 99.2 F with tachycardia, tachypnea.  Creatinine was elevated 1.4.  Glucose at 320.  Lactic acid was elevated.  WBC was elevated as well.  Urinalysis showed white cells more than 50.  CT abdomen showed bilateral punctuate nonobstructive obstructive renal calculi .  In the ED patient was given azactam and was admitted hospital for sepsis secondary to UTI.  Assessment/Plan:  Principal Problem:   Sepsis (HCC) Active Problems:   Essential hypertension, benign   Type II diabetes mellitus with neurological manifestations, uncontrolled (HCC)   Acute lower UTI   Hematuria    Sepsis secondary to UTI with hematuria:  Present on admission as evidenced by tachycardia leukocytosis and elevated lactate with UTI.  On azactam.  Urine culture pending.  Blood cultures negative in less than 12 hours.  Continue IV fluids for 1 more day.  Significant hypokalemia.  We will replenish with IV KCl p.o. KCl and IV magnesium sulfate.   Nephrolithiasis.   CT shows nonobstructive small renal stone, question if patient passed a small stone.  On dual antiplatelets currently on hold on Plavix.  Aspirin to be continued.  Resume Plavix when no further hematuria.   Essential hypertension:  Has improved at this time.  Continue carvedilol, hydralazine, amlodipine.  Continue to hold diuretics for now.   Diabetes mellitus type II.,  Continue sliding-scale insulin, long-acting insulin, Accu-Cheks.  Patient is on Victoza and metformin at home.   Chronic medical conditions include history  significant of STEMI, CVA, hypertension, diabetes on insulin, Gastroparesis,  anxiety/depression.  Continue aspirin.  Hold Plavix.  History of stroke with wheelchair-bound status.  On aspirin Plavix at home.  Continue Lipitor.  Continue aspirin for now.  Start Plavix when no more hematuria.    DVT prophylaxis: SCDs Start: 01/09/21 2017   Code Status: Full code  Family Communication: None  Status is: Inpatient  Remains inpatient appropriate because:IV treatments appropriate due to intensity of illness or inability to take PO and Inpatient level of care appropriate due to severity of illness  Dispo: The patient is from: Home              Anticipated d/c is to: Home              Patient currently is not medically stable to d/c.   Difficult to place patient No   Consultants: None  Procedures: None   Anti-infectives:  azactam IV  Anti-infectives (From admission, onward)    Start     Dose/Rate Route Frequency Ordered Stop   01/10/21 0600  aztreonam (AZACTAM) 2 g in sodium chloride 0.9 % 100 mL IVPB        2 g 200 mL/hr over 30 Minutes Intravenous Every 12 hours 01/09/21 2026     01/09/21 1830  aztreonam (AZACTAM) 2 g in sodium chloride 0.9 % 100 mL IVPB        2 g 200 mL/hr over 30 Minutes Intravenous  Once 01/09/21 1820 01/10/21 0027      Subjective: Today, patient was seen and examined at bedside.  Patient states that his dysuria  and hematuria has improved today.  Denies any chest pain, he however feels fatigued and weak  Objective: Vitals:   01/10/21 0759 01/10/21 0930  BP: (!) 137/99 133/88  Pulse: 94 100  Resp: 17 (!) 22  Temp: 98.9 F (37.2 C)   SpO2: 96% 95%    Intake/Output Summary (Last 24 hours) at 01/10/2021 1050 Last data filed at 01/10/2021 0512 Gross per 24 hour  Intake --  Output 750 ml  Net -750 ml   Filed Weights   01/09/21 1337  Weight: 81.6 kg   Body mass index is 26.58 kg/m.   Physical Exam: GENERAL: Patient is alert awake and  oriented. Not in obvious distress. HENT: No scleral pallor or icterus. Pupils equally reactive to light. Oral mucosa is moist NECK: is supple, no gross swelling noted. CHEST: Clear to auscultation. No crackles or wheezes.  Diminished breath sounds bilaterally. CVS: S1 and S2 heard, no murmur. Regular rate and rhythm.  ABDOMEN: Soft, non-tender, bowel sounds are present. EXTREMITIES: No edema. CNS: Cranial nerves are intact.  Moving extremities. SKIN: warm and dry without rashes.  Data Review: I have personally reviewed the following laboratory data and studies,  CBC: Recent Labs  Lab 01/09/21 1340 01/10/21 0450  WBC 14.0* 20.1*  NEUTROABS 11.3*  --   HGB 12.8* 15.2  HCT 37.6* 43.8  MCV 90.2 86.9  PLT 184 221   Basic Metabolic Panel: Recent Labs  Lab 01/09/21 1441 01/10/21 0450  NA 139 135  K 3.8 2.8*  CL 91* 93*  CO2 34* 29  GLUCOSE 320* 254*  BUN 27* 25*  CREATININE 1.42* 1.26*  CALCIUM 10.4* 9.7   Liver Function Tests: Recent Labs  Lab 01/09/21 1441  AST 22  ALT 42  ALKPHOS 81  BILITOT 1.0  PROT 8.5*  ALBUMIN 4.1   No results for input(s): LIPASE, AMYLASE in the last 168 hours. No results for input(s): AMMONIA in the last 168 hours. Cardiac Enzymes: No results for input(s): CKTOTAL, CKMB, CKMBINDEX, TROPONINI in the last 168 hours. BNP (last 3 results) No results for input(s): BNP in the last 8760 hours.  ProBNP (last 3 results) No results for input(s): PROBNP in the last 8760 hours.  CBG: Recent Labs  Lab 01/09/21 2241 01/10/21 0448 01/10/21 0758  GLUCAP 296* 275* 243*   Recent Results (from the past 240 hour(s))  Resp Panel by RT-PCR (Flu A&B, Covid) Nasopharyngeal Swab     Status: None   Collection Time: 01/09/21  3:27 PM   Specimen: Nasopharyngeal Swab; Nasopharyngeal(NP) swabs in vial transport medium  Result Value Ref Range Status   SARS Coronavirus 2 by RT PCR NEGATIVE NEGATIVE Final    Comment: (NOTE) SARS-CoV-2 target nucleic acids  are NOT DETECTED.  The SARS-CoV-2 RNA is generally detectable in upper respiratory specimens during the acute phase of infection. The lowest concentration of SARS-CoV-2 viral copies this assay can detect is 138 copies/mL. A negative result does not preclude SARS-Cov-2 infection and should not be used as the sole basis for treatment or other patient management decisions. A negative result may occur with  improper specimen collection/handling, submission of specimen other than nasopharyngeal swab, presence of viral mutation(s) within the areas targeted by this assay, and inadequate number of viral copies(<138 copies/mL). A negative result must be combined with clinical observations, patient history, and epidemiological information. The expected result is Negative.  Fact Sheet for Patients:  BloggerCourse.com  Fact Sheet for Healthcare Providers:  SeriousBroker.it  This test is no t  yet approved or cleared by the [place] and  has been authorized for detection and/or diagnosis of SARS-CoV-2 by FDA under an Emergency Use Authorization (EUA). This EUA will remain  in effect (meaning this test can be used) for the duration of the COVID-19 declaration under Section 564(b)(1) of the Act, 21 U.S.C.section 360bbb-3(b)(1), unless the authorization is terminated  or revoked sooner.       Influenza A by PCR NEGATIVE NEGATIVE Final   Influenza B by PCR NEGATIVE NEGATIVE Final    Comment: (NOTE) The Xpert Xpress SARS-CoV-2/FLU/RSV plus assay is intended as an aid in the diagnosis of influenza from Nasopharyngeal swab specimens and should not be used as a sole basis for treatment. Nasal washings and aspirates are unacceptable for Xpert Xpress SARS-CoV-2/FLU/RSV testing.  Fact Sheet for Patients: BloggerCourse.com  Fact Sheet for Healthcare Providers: SeriousBroker.it  This test is  not yet approved or cleared by the [place] FDA and has been authorized for detection and/or diagnosis of SARS-CoV-2 by FDA under an Emergency Use Authorization (EUA). This EUA will remain in effect (meaning this test can be used) for the duration of the COVID-19 declaration under Section 564(b)(1) of the Act, 21 U.S.C. section 360bbb-3(b)(1), unless the authorization is terminated or revoked.  Performed at [facility], [address] [place], [place] [address]   Blood culture (single)     Status: None (Preliminary result)   Collection Time: 01/09/21  6:36 PM   Specimen: BLOOD  Result Value Ref Range Status   Specimen Description BLOOD LEFT ANTECUBITAL  Final   Special Requests   Final    BOTTLES DRAWN AEROBIC AND ANAEROBIC Blood Culture adequate volume   Culture   Final    NO GROWTH < 12 HOURS Performed at [facility], [address] [place], [place] [address]    Report Status PENDING  Incomplete     Studies: DG Chest 1 View  Result Date: 01/09/2021 CLINICAL DATA:  Tachycardia.  Lactic elevated.  Hematuria. EXAM: CHEST  1 VIEW COMPARISON:  06/06/2020 FINDINGS: Shallow inspiration. Heart size and pulmonary vascularity are normal. Minimal linear atelectasis suggested in the left base. No focal consolidation or airspace disease. No pleural effusions. No pneumothorax. Mediastinal contours appear intact. A loop recorder is present. IMPRESSION: Minimal linear atelectasis in the left base. No focal consolidation. Electronically Signed   By: Burman Nieves M.D.   On: 01/09/2021 19:03   CT Renal Stone Study  Result Date: 01/09/2021 CLINICAL DATA:  Hematuria. The patient takes Plavix for prior stroke. EXAM: CT ABDOMEN AND PELVIS WITHOUT CONTRAST TECHNIQUE: Multidetector CT imaging of the abdomen and pelvis was performed following the standard protocol without IV contrast. COMPARISON:  01/02/2021 CT abdomen/pelvis with contrast from [facility]. FINDINGS:  Lower chest: The bottom of a loop recorder is visible on the scout image. The lung bases appear clear. Hepatobiliary: Diffuse hepatic steatosis. Minimal sparing along the gallbladder fossa. Calcification adjacent to a small hypodense subcapsular lesion posteriorly in the right hepatic lobe on image 18 series 2 unchanged from prior. Pancreas: Unremarkable Spleen: Unremarkable Adrenals/Urinary Tract: Both adrenal glands appear normal. Stable 3 mm right mid to lower kidney nonobstructive renal calculus on image 81 series 5. Suspected 1-2 mm nonobstructive left mid kidney calculus. Complex left renal cystic lesion with a higher density marginal element, bilobed appearance with a septation, and some calcification along the inferior margin of the dominant exophytic portion. Today's exam is performed without IV contrast and a Bosniak classification  cannot be assigned. Stomach/Bowel: Prominent stool throughout the colon favors constipation. Wall thickening in the lower rectum on image 88 of series 2 was not present 1 week ago and accordingly is probably peristaltic or incidental. Appendix surgically absent. Vascular/Lymphatic: Generally mild atherosclerotic calcifications are observed. No pathologic adenopathy. Reproductive: Unremarkable Other: No supplemental non-categorized findings. 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 IMPRESSION: 1. Single punctate bilateral nonobstructive renal calculi. 2. Complex cyst of the left mid kidney roughly similar to prior exams, noncontrast CT is not suitable for assigning a Bosniak classification. If further workup of this lesion is warranted, I would suggest renal protocol MRI with and without contrast on a elective basis. 3. Diffuse hepatic steatosis. 4.  Prominent stool throughout the colon favors constipation. 5. Mild atherosclerotic calcifications. 6. 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 Electronically Signed   By:  Gaylyn Rong M.D.   On: 01/09/2021 17:00      Joycelyn Das, MD  [facility] 01/10/2021  If 7PM-7AM, please contact night-coverage

## 2021-01-11 DIAGNOSIS — A419 Sepsis, unspecified organism: Secondary | ICD-10-CM | POA: Diagnosis not present

## 2021-01-11 DIAGNOSIS — I10 Essential (primary) hypertension: Secondary | ICD-10-CM | POA: Diagnosis not present

## 2021-01-11 DIAGNOSIS — R319 Hematuria, unspecified: Secondary | ICD-10-CM | POA: Diagnosis not present

## 2021-01-11 DIAGNOSIS — N390 Urinary tract infection, site not specified: Secondary | ICD-10-CM | POA: Diagnosis not present

## 2021-01-11 LAB — GLUCOSE, CAPILLARY
Glucose-Capillary: 354 mg/dL — ABNORMAL HIGH (ref 70–99)
Glucose-Capillary: 309 mg/dL — ABNORMAL HIGH (ref 70–99)

## 2021-01-11 LAB — CBG MONITORING, ED
Glucose-Capillary: 191 mg/dL — ABNORMAL HIGH (ref 70–99)
Glucose-Capillary: 267 mg/dL — ABNORMAL HIGH (ref 70–99)

## 2021-01-11 LAB — URINE CULTURE: Culture: >=100000 — AB

## 2021-01-11 LAB — PHOSPHORUS: Phosphorus: 2.7 mg/dL (ref 2.5–4.6)

## 2021-01-11 LAB — CBC
HCT: 41.8 % (ref 39.0–52.0)
Hemoglobin: 14.2 g/dL (ref 13.0–17.0)
MCH: 29.1 pg (ref 26.0–34.0)
MCHC: 34 g/dL (ref 30.0–36.0)
MCV: 85.7 fL (ref 80.0–100.0)
Platelets: 204 10*3/uL (ref 150–400)
RBC: 4.88 MIL/uL (ref 4.22–5.81)
RDW: 11.4 % — ABNORMAL LOW (ref 11.5–15.5)
WBC: 13.2 10*3/uL — ABNORMAL HIGH (ref 4.0–10.5)
nRBC: 0 % (ref 0.0–0.2)

## 2021-01-11 LAB — BASIC METABOLIC PANEL
Anion gap: 13 (ref 5–15)
BUN: 19 mg/dL (ref 6–20)
CO2: 31 mmol/L (ref 22–32)
Calcium: 9.3 mg/dL (ref 8.9–10.3)
Chloride: 90 mmol/L — ABNORMAL LOW (ref 98–111)
Creatinine, Ser: 1.11 mg/dL (ref 0.61–1.24)
GFR, Estimated: >60 mL/min (ref >60)
Glucose, Bld: 151 mg/dL — ABNORMAL HIGH (ref 70–99)
Potassium: 3.6 mmol/L (ref 3.5–5.1)
Sodium: 134 mmol/L — ABNORMAL LOW (ref 135–145)

## 2021-01-11 LAB — MAGNESIUM: Magnesium: 1.9 mg/dL (ref 1.7–2.4)

## 2021-01-11 MED ORDER — LEVOFLOXACIN IN D5W 750 MG/150ML IV SOLN
750.0000 mg | INTRAVENOUS | Status: DC
  Administered 2021-01-12 – 2021-01-14 (×3): 750 mg via INTRAVENOUS
  Filled 2021-01-11 (×3): qty 150

## 2021-01-11 MED ORDER — ACETAMINOPHEN 325 MG PO TABS
650.0000 mg | ORAL_TABLET | Freq: Once | ORAL | Status: AC
  Administered 2021-01-11: 17:00:00 650 mg via ORAL
  Filled 2021-01-11: qty 2

## 2021-01-11 MED ORDER — CLOPIDOGREL BISULFATE 75 MG PO TABS
75.0000 mg | ORAL_TABLET | Freq: Every day | ORAL | Status: DC
  Administered 2021-01-11 – 2021-01-19 (×9): 75 mg via ORAL
  Filled 2021-01-11 (×9): qty 1

## 2021-01-11 MED ORDER — INSULIN ASPART 100 UNIT/ML IJ SOLN
4.0000 [IU] | Freq: Three times a day (TID) | INTRAMUSCULAR | Status: DC
  Administered 2021-01-11 – 2021-01-13 (×5): 4 [IU] via SUBCUTANEOUS
  Filled 2021-01-11 (×5): qty 1

## 2021-01-11 MED ORDER — INSULIN GLARGINE-YFGN 100 UNIT/ML ~~LOC~~ SOLN
25.0000 [IU] | Freq: Every day | SUBCUTANEOUS | Status: DC
  Administered 2021-01-11 – 2021-01-18 (×8): 25 [IU] via SUBCUTANEOUS
  Filled 2021-01-11 (×9): qty 0.25

## 2021-01-11 NOTE — Progress Notes (Signed)
PROGRESS NOTE  TRACE WIRICK WIO:[id number] DOB: October 27, 1970 DOA: 01/09/2021 PCP: [facility]   LOS: 2 days   Brief narrative:  [name] [name] is a 50 y.o. male with past medical history of ST elevation MI, CVA, hypertension, diabetes mellitus on insulin, gastroparesis, anxiety depression presented to hospital with dysuria, hematuria without obvious fever but had some chills.  In the ED, patient had mild grade fever at 99.2 F with tachycardia, tachypnea.  Creatinine was elevated 1.4.  Glucose at 320.  Lactic acid was elevated.  WBC was elevated as well.  Urinalysis showed white cells more than 50.  CT abdomen showed bilateral punctuate nonobstructive obstructive renal calculi .  Patient was given azactam and was admitted hospital for sepsis secondary to UTI.  Assessment/Plan:  Principal Problem:   Sepsis (HCC) Active Problems:   Essential hypertension, benign   Type II diabetes mellitus with neurological manifestations, uncontrolled (HCC)   Acute lower UTI   Hematuria  Sepsis secondary to gram-negative UTI with hematuria:  Present on admission as evidenced by tachycardia, leukocytosis and elevated lactate with UTI.  On IV azactam.  Urine culture with gram-negative rods.  Blood cultures negative in 2 days.  Will discontinue IV fluids.  Currently on aspirin.  Will resume Plavix.  Follow further culture and sensitivity.  Significant hypokalemia.  Replenished and has improved.  Potassium 3.6.    Nephrolithiasis.   CT shows nonobstructive small renal stone, question if patient passed a small stone.     Essential hypertension:  Has improved at this time.  Continue carvedilol, hydralazine, amlodipine.  Continue to hold diuretics for now.   Diabetes mellitus type II.,  Continue sliding-scale insulin, long-acting insulin, Accu-Cheks.  Patient is on Victoza and metformin at home.  Dose of insulin has been adjusted.  Diabetic coordinator on board.   Chronic medical conditions  include history significant of STEMI, CVA, hypertension, diabetes on insulin, Gastroparesis,  anxiety/depression.  Continue aspirin.  Hold Plavix.  History of stroke with wheelchair-bound status.  On aspirin Plavix at home.  Continue Lipitor.  Continue aspirin and will resume Plavix  DVT prophylaxis: SCDs Start: 01/09/21 2017   Code Status: Full code  Family Communication: None  Status is: Inpatient  Remains inpatient appropriate because:IV treatments appropriate due to intensity of illness or inability to take PO and Inpatient level of care appropriate due to severity of illness  Dispo: The patient is from: Home              Anticipated d/c is to: Home likely in 1 to 2 days.              Patient currently is not medically stable to d/c.   Difficult to place patient No  Consultants: None  Procedures: None   Anti-infectives:  azactam IV  Anti-infectives (From admission, onward)    Start     Dose/Rate Route Frequency Ordered Stop   01/10/21 2200  aztreonam (AZACTAM) 2 g in sodium chloride 0.9 % 100 mL IVPB        2 g 200 mL/hr over 30 Minutes Intravenous Every 8 hours 01/10/21 2111     01/10/21 0600  aztreonam (AZACTAM) 2 g in sodium chloride 0.9 % 100 mL IVPB  Status:  Discontinued        2 g 200 mL/hr over 30 Minutes Intravenous Every 12 hours 01/09/21 2026 01/10/21 2111   01/09/21 1830  aztreonam (AZACTAM) 2 g in sodium chloride 0.9 % 100 mL IVPB  2 g 200 mL/hr over 30 Minutes Intravenous  Once 01/09/21 1820 01/10/21 0027      Subjective: Today, patient was seen and examined at bedside.  Denies interval complaints.  Denies any fever, nausea, vomiting, complains of chills and has mild neck pain  Objective: Vitals:   01/11/21 1145 01/11/21 1223  BP: 121/80 (!) 144/91  Pulse: 90 92  Resp: (!) 25 18  Temp: 98.9 F (37.2 C) (!) 100.9 F (38.3 C)  SpO2: 90% 96%    Intake/Output Summary (Last 24 hours) at 01/11/2021 1236 Last data filed at 01/10/2021  2117 Gross per 24 hour  Intake --  Output 1000 ml  Net -1000 ml    Filed Weights   01/09/21 1337  Weight: 81.6 kg   Body mass index is 26.58 kg/m.   Physical Exam: GENERAL: Patient is alert awake and oriented. Not in obvious distress. HENT: No scleral pallor or icterus. Pupils equally reactive to light. Oral mucosa is moist, neck discomfort. NECK: is supple, no gross swelling noted. CHEST: Clear to auscultation. No crackles or wheezes.  Diminished breath sounds bilaterally. CVS: S1 and S2 heard, no murmur. Regular rate and rhythm.  ABDOMEN: Soft, non-tender, bowel sounds are present. EXTREMITIES: No edema. CNS: Cranial nerves are intact.  Moving all extremities. SKIN: warm and dry without rashes.  Data Review: I have personally reviewed the following laboratory data and studies,  CBC: Recent Labs  Lab 01/09/21 1340 01/10/21 0450 01/11/21 0421  WBC 14.0* 20.1* 13.2*  NEUTROABS 11.3*  --   --   HGB 12.8* 15.2 14.2  HCT 37.6* 43.8 41.8  MCV 90.2 86.9 85.7  PLT 184 221 204    Basic Metabolic Panel: Recent Labs  Lab 01/09/21 1441 01/10/21 0450 01/10/21 1112 01/10/21 1731 01/11/21 0421  NA 139 135 135 133* 134*  K 3.8 2.8* 3.0* 3.1* 3.6  CL 91* 93* 92* 91* 90*  CO2 34* 29 32 31 31  GLUCOSE 320* 254* 255* 222* 151*  BUN 27* 25* 24* 22* 19  CREATININE 1.42* 1.26* 1.26* 1.18 1.11  CALCIUM 10.4* 9.7 9.4 9.3 9.3  MG  --   --   --   --  1.9  PHOS  --   --   --   --  2.7    Liver Function Tests: Recent Labs  Lab 01/09/21 1441  AST 22  ALT 42  ALKPHOS 81  BILITOT 1.0  PROT 8.5*  ALBUMIN 4.1    No results for input(s): LIPASE, AMYLASE in the last 168 hours. No results for input(s): AMMONIA in the last 168 hours. Cardiac Enzymes: No results for input(s): CKTOTAL, CKMB, CKMBINDEX, TROPONINI in the last 168 hours. BNP (last 3 results) No results for input(s): BNP in the last 8760 hours.  ProBNP (last 3 results) No results for input(s): PROBNP in the last  8760 hours.  CBG: Recent Labs  Lab 01/10/21 1152 01/10/21 1553 01/10/21 2259 01/11/21 0736 01/11/21 1122  GLUCAP 219* 247* 286* 191* 267*    Recent Results (from the past 240 hour(s))  Urine Culture     Status: Abnormal (Preliminary result)   Collection Time: 01/09/21  1:39 PM   Specimen: Urine, Random  Result Value Ref Range Status   Specimen Description   Final    URINE, RANDOM Performed at [facility], [address] [place], [place] [address]    Special Requests   Final    NONE Performed at [facility], [address] [place],  [place] [address]    Culture (A)  Final    >=100,000 COLONIES/mL GRAM NEGATIVE RODS IDENTIFICATION AND SUSCEPTIBILITIES TO FOLLOW Performed at [facility], [address], [place], [place] [address]    Report Status PENDING  Incomplete  Resp Panel by RT-PCR (Flu A&B, Covid) Nasopharyngeal Swab     Status: None   Collection Time: 01/09/21  3:27 PM   Specimen: Nasopharyngeal Swab; Nasopharyngeal(NP) swabs in vial transport medium  Result Value Ref Range Status   SARS Coronavirus 2 by RT PCR NEGATIVE NEGATIVE Final    Comment: (NOTE) SARS-CoV-2 target nucleic acids are NOT DETECTED.  The SARS-CoV-2 RNA is generally detectable in upper respiratory specimens during the acute phase of infection. The lowest concentration of SARS-CoV-2 viral copies this assay can detect is 138 copies/mL. A negative result does not preclude SARS-Cov-2 infection and should not be used as the sole basis for treatment or other patient management decisions. A negative result may occur with  improper specimen collection/handling, submission of specimen other than nasopharyngeal swab, presence of viral mutation(s) within the areas targeted by this assay, and inadequate number of viral copies(<138 copies/mL). A negative result must be combined with clinical observations, patient history, and epidemiological information. The expected result  is Negative.  Fact Sheet for Patients:  BloggerCourse.com  Fact Sheet for Healthcare Providers:  SeriousBroker.it  This test is no t yet approved or cleared by the [place] FDA and  has been authorized for detection and/or diagnosis of SARS-CoV-2 by FDA under an Emergency Use Authorization (EUA). This EUA will remain  in effect (meaning this test can be used) for the duration of the COVID-19 declaration under Section 564(b)(1) of the Act, 21 U.S.C.section 360bbb-3(b)(1), unless the authorization is terminated  or revoked sooner.       Influenza A by PCR NEGATIVE NEGATIVE Final   Influenza B by PCR NEGATIVE NEGATIVE Final    Comment: (NOTE) The Xpert Xpress SARS-CoV-2/FLU/RSV plus assay is intended as an aid in the diagnosis of influenza from Nasopharyngeal swab specimens and should not be used as a sole basis for treatment. Nasal washings and aspirates are unacceptable for Xpert Xpress SARS-CoV-2/FLU/RSV testing.  Fact Sheet for Patients: BloggerCourse.com  Fact Sheet for Healthcare Providers: SeriousBroker.it  This test is not yet approved or cleared by the [place] FDA and has been authorized for detection and/or diagnosis of SARS-CoV-2 by FDA under an Emergency Use Authorization (EUA). This EUA will remain in effect (meaning this test can be used) for the duration of the COVID-19 declaration under Section 564(b)(1) of the Act, 21 U.S.C. section 360bbb-3(b)(1), unless the authorization is terminated or revoked.  Performed at [facility], [address] [place], [place] [address]   Blood culture (single)     Status: None (Preliminary result)   Collection Time: 01/09/21  6:36 PM   Specimen: BLOOD  Result Value Ref Range Status   Specimen Description BLOOD LEFT ANTECUBITAL  Final   Special Requests   Final    BOTTLES DRAWN AEROBIC AND ANAEROBIC Blood  Culture adequate volume   Culture   Final    NO GROWTH 2 DAYS Performed at [facility], [address], [place], [place] [address]    Report Status PENDING  Incomplete      Studies: DG Chest 1 View  Result Date: 01/09/2021 CLINICAL DATA:  Tachycardia.  Lactic elevated.  Hematuria. EXAM: CHEST  1 VIEW COMPARISON:  06/06/2020 FINDINGS: Shallow inspiration. Heart size and pulmonary vascularity are  normal. Minimal linear atelectasis suggested in the left base. No focal consolidation or airspace disease. No pleural effusions. No pneumothorax. Mediastinal contours appear intact. A loop recorder is present. IMPRESSION: Minimal linear atelectasis in the left base. No focal consolidation. Electronically Signed   By: Burman Nieves M.D.   On: 01/09/2021 19:03   CT Renal Stone Study  Result Date: 01/09/2021 CLINICAL DATA:  Hematuria. The patient takes Plavix for prior stroke. EXAM: CT ABDOMEN AND PELVIS WITHOUT CONTRAST TECHNIQUE: Multidetector CT imaging of the abdomen and pelvis was performed following the standard protocol without IV contrast. COMPARISON:  01/02/2021 CT abdomen/pelvis with contrast from [facility] FINDINGS: Lower chest: The bottom of a loop recorder is visible on the scout image. The lung bases appear clear. Hepatobiliary: Diffuse hepatic steatosis. Minimal sparing along the gallbladder fossa. Calcification adjacent to a small hypodense subcapsular lesion posteriorly in the right hepatic lobe on image 18 series 2 unchanged from prior. Pancreas: Unremarkable Spleen: Unremarkable Adrenals/Urinary Tract: Both adrenal glands appear normal. Stable 3 mm right mid to lower kidney nonobstructive renal calculus on image 81 series 5. Suspected 1-2 mm nonobstructive left mid kidney calculus. Complex left renal cystic lesion with a higher density marginal element, bilobed appearance with a septation, and some calcification along the inferior margin of the dominant exophytic  portion. Today's exam is performed without IV contrast and a Bosniak classification cannot be assigned. Stomach/Bowel: Prominent stool throughout the colon favors constipation. Wall thickening in the lower rectum on image 88 of series 2 was not present 1 week ago and accordingly is probably peristaltic or incidental. Appendix surgically absent. Vascular/Lymphatic: Generally mild atherosclerotic calcifications are observed. No pathologic adenopathy. Reproductive: Unremarkable Other: No supplemental non-categorized findings. 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 IMPRESSION: 1. Single punctate bilateral nonobstructive renal calculi. 2. Complex cyst of the left mid kidney roughly similar to prior exams, noncontrast CT is not suitable for assigning a Bosniak classification. If further workup of this lesion is warranted, I would suggest renal protocol MRI with and without contrast on a elective basis. 3. Diffuse hepatic steatosis. 4.  Prominent stool throughout the colon favors constipation. 5. Mild atherosclerotic calcifications. 6. 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 Electronically Signed   By: Gaylyn Rong M.D.   On: 01/09/2021 17:00      Joycelyn Das, MD  [facility] 01/11/2021  If 7PM-7AM, please contact night-coverage

## 2021-01-11 NOTE — Progress Notes (Signed)
   01/11/21 1604  Assess: MEWS Score  Temp (!) 101.7 F (38.7 C)  BP 109/72  Pulse Rate 93  Resp 20  SpO2 91 %  O2 Device Room Air  Assess: MEWS Score  MEWS Temp 2  MEWS Systolic 0  MEWS Pulse 0  MEWS RR 0  MEWS LOC 0  MEWS Score 2  MEWS Score Color Yellow  Assess: if the MEWS score is Yellow or Red  Were vital signs taken at a resting state? Yes  Focused Assessment No change from prior assessment  Does the patient meet 2 or more of the SIRS criteria? No  MEWS guidelines implemented *See Row Information* No, previously yellow, continue vital signs every 4 hours  Notify: Charge Nurse/RN  Name of Charge Nurse/RN Notified jo  Date Charge Nurse/RN Notified 01/11/21  Time Charge Nurse/RN Notified 1230  Notify: Provider  Provider Name/Title Dr Rebekah Chesterfield  Date Provider Notified 01/11/21  Time Provider Notified 1230  Notification Reason Change in status (fever)  Provider response See new orders  Date of Provider Response 01/11/21  Time of Provider Response 1231  Assess: SIRS CRITERIA  SIRS Temperature  1  SIRS Pulse 1  SIRS Respirations  0  SIRS WBC 0  SIRS Score Sum  2

## 2021-01-11 NOTE — TOC Initial Note (Signed)
Transition of Care [facility]) - Initial/Assessment Note    Patient Details  Name: [name] [name] MRN: [id number] Date of Birth: [birth year]  [facility] CM/SW Contact:    Allayne Butcher, RN Phone Number: 01/11/2021, 4:13 PM  Clinical Narrative:                 Patient requested to see social worker.  TOC team acknowledged consult.  RNCM called to speak with patient vis phone.  He reports that when he is discharged he will have no where to go , he was served with eviction papers last Wed so tomorrow he has to be out.  Patient reports that he is behind on his rent, more than a month.  He gets disability about $1100/month and he only has $220 left until the second Wed in October.  Patient does not drive since he had a stroke and he has no family or friends.  He does report that he has a girlfriend but he doesn't want to put her out by asking for help.   He has a sister listed as a contact also but he says she is on a cruise right now.  TOC will provide patient with information on the homeless shelter and local hotels in the morning.    Expected Discharge Plan: Homeless Shelter Barriers to Discharge: Continued Medical Work up   Patient Goals and CMS Choice Patient states their goals for this hospitalization and ongoing recovery are:: Patient has no where to go at discharge he is homeless      Expected Discharge Plan and Services Expected Discharge Plan: Homeless Shelter   Discharge Planning Services: CM Consult   Living arrangements for the past 2 months: Apartment                 DME Arranged: N/A DME Agency: NA       HH Arranged: NA          Prior Living Arrangements/Services Living arrangements for the past 2 months: Apartment Lives with:: Self Patient language and need for interpreter reviewed:: Yes Do you feel safe going back to the place where you live?: Yes      Need for Family Participation in Patient Care: Yes (Comment) Care giver support system in  place?: No (comment)   Criminal Activity/Legal Involvement Pertinent to Current Situation/Hospitalization: No - Comment as needed  Activities of Daily Living Home Assistive Devices/Equipment: None ADL Screening (condition at time of admission) Patient's cognitive ability adequate to safely complete daily activities?: Yes Is the patient deaf or have difficulty hearing?: No Does the patient have difficulty seeing, even when wearing glasses/contacts?: No Does the patient have difficulty concentrating, remembering, or making decisions?: No Patient able to express need for assistance with ADLs?: No Does the patient have difficulty dressing or bathing?: Yes Independently performs ADLs?: No Communication: Needs assistance Dressing (OT): Needs assistance Grooming: Needs assistance Feeding: Needs assistance Bathing: Needs assistance Toileting: Needs assistance In/Out Bed: Needs assistance Walks in Home: Needs assistance Weakness of Legs: Both Weakness of Arms/Hands: None  Permission Sought/Granted   Permission granted to share information with : No              Emotional Assessment   Attitude/Demeanor/Rapport: Engaged Affect (typically observed): Accepting Orientation: : Oriented to Self, Oriented to Place, Oriented to  Time, Oriented to Situation Alcohol / Substance Use: Not Applicable Psych Involvement: No (comment)  Admission diagnosis:  Sepsis (HCC) [A41.9] Sepsis without acute organ dysfunction, due to unspecified  organism [facility]) [A41.9] Patient Active Problem List   Diagnosis Date Noted   Sepsis (HCC) 01/09/2021   Acute lower UTI 01/09/2021   Hematuria 01/09/2021   STEMI involving left anterior descending coronary artery (HCC) 01/23/2020   STEMI (ST elevation myocardial infarction) (HCC) 01/23/2020   Non-STEMI (non-ST elevated myocardial infarction) (HCC) 04/12/2019   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 12/25/2016   Chronic cerebrovascular accident (CVA) 12/25/2016   PCP:  [facility]:   [facility] [address] [place] - [address] [place] [address] Phone: [phone] Fax: [phone]     Social Determinants of Health (SDOH) Interventions    Readmission Risk Interventions No flowsheet data found.

## 2021-01-11 NOTE — Progress Notes (Signed)
Inpatient Diabetes Program Recommendations  AACE/ADA: New Consensus Statement on Inpatient Glycemic Control   Target Ranges:  Prepandial:   less than 140 mg/dL      Peak postprandial:   less than 180 mg/dL (1-2 hours)      Critically ill patients:  140 - 180 mg/dL   Results for [name] [name], [name] [name] (MRN [id number]) as of 01/11/2021 12:02  Ref. Range 01/10/2021 07:58 01/10/2021 11:52 01/10/2021 15:53 01/10/2021 22:59 01/11/2021 07:36 01/11/2021 11:22  Glucose-Capillary Latest Ref Range: 70 - 99 mg/dL 175 (H) 102 (H) 585 (H) 286 (H) 191 (H) 267 (H)    Review of Glycemic Control  Diabetes history: DM2 Outpatient Diabetes medications: Lantus 40 units QHS, Novolog 10 units TID with meals, Jardiance 1/2 pill daily Current orders for Inpatient glycemic control: Novolog 0-15 units TID with meals and bedtime, Semglee 20 units QHS   Inpatient Diabetes Program Recommendations:     Insulin: Please consider increasing Semglee to 25 units QHS and ordering Novolog 4 units TID with meals for meal coverage if patient eats at least 50% of meals.  Thanks, Orlando Penner, RN, MSN, CDE Diabetes Coordinator Inpatient Diabetes Program [phone] (Team Pager from 8am to 5pm)

## 2021-01-12 DIAGNOSIS — N390 Urinary tract infection, site not specified: Secondary | ICD-10-CM | POA: Diagnosis not present

## 2021-01-12 DIAGNOSIS — R319 Hematuria, unspecified: Secondary | ICD-10-CM | POA: Diagnosis not present

## 2021-01-12 DIAGNOSIS — I10 Essential (primary) hypertension: Secondary | ICD-10-CM | POA: Diagnosis not present

## 2021-01-12 DIAGNOSIS — E876 Hypokalemia: Secondary | ICD-10-CM

## 2021-01-12 DIAGNOSIS — A419 Sepsis, unspecified organism: Secondary | ICD-10-CM | POA: Diagnosis not present

## 2021-01-12 LAB — CBC
HCT: 39 % (ref 39.0–52.0)
Hemoglobin: 13.9 g/dL (ref 13.0–17.0)
MCH: 31 pg (ref 26.0–34.0)
MCHC: 35.6 g/dL (ref 30.0–36.0)
MCV: 86.9 fL (ref 80.0–100.0)
Platelets: 185 10*3/uL (ref 150–400)
RBC: 4.49 MIL/uL (ref 4.22–5.81)
RDW: 11.5 % (ref 11.5–15.5)
WBC: 10.9 10*3/uL — ABNORMAL HIGH (ref 4.0–10.5)
nRBC: 0 % (ref 0.0–0.2)

## 2021-01-12 LAB — BASIC METABOLIC PANEL
Anion gap: 11 (ref 5–15)
BUN: 16 mg/dL (ref 6–20)
CO2: 32 mmol/L (ref 22–32)
Calcium: 9 mg/dL (ref 8.9–10.3)
Chloride: 92 mmol/L — ABNORMAL LOW (ref 98–111)
Creatinine, Ser: 1.08 mg/dL (ref 0.61–1.24)
GFR, Estimated: >60 mL/min (ref >60)
Glucose, Bld: 150 mg/dL — ABNORMAL HIGH (ref 70–99)
Potassium: 2.7 mmol/L — CL (ref 3.5–5.1)
Sodium: 135 mmol/L (ref 135–145)

## 2021-01-12 LAB — GLUCOSE, CAPILLARY
Glucose-Capillary: 204 mg/dL — ABNORMAL HIGH (ref 70–99)
Glucose-Capillary: 184 mg/dL — ABNORMAL HIGH (ref 70–99)
Glucose-Capillary: 152 mg/dL — ABNORMAL HIGH (ref 70–99)
Glucose-Capillary: 199 mg/dL — ABNORMAL HIGH (ref 70–99)
Glucose-Capillary: 174 mg/dL — ABNORMAL HIGH (ref 70–99)
Glucose-Capillary: 307 mg/dL — ABNORMAL HIGH (ref 70–99)

## 2021-01-12 MED ORDER — POTASSIUM CHLORIDE CRYS ER 20 MEQ PO TBCR: 40.0000 meq | EXTENDED_RELEASE_TABLET | Freq: Once | ORAL | Status: DC

## 2021-01-12 MED ORDER — POTASSIUM CHLORIDE CRYS ER 20 MEQ PO TBCR
40.0000 meq | EXTENDED_RELEASE_TABLET | Freq: Three times a day (TID) | ORAL | Status: AC
  Administered 2021-01-12 (×3): 40 meq via ORAL
  Filled 2021-01-12 (×3): qty 2

## 2021-01-12 MED ORDER — POTASSIUM CHLORIDE 10 MEQ/100ML IV SOLN
10.0000 meq | INTRAVENOUS | Status: AC
  Administered 2021-01-12 (×2): 10 meq via INTRAVENOUS
  Filled 2021-01-12 (×2): qty 100

## 2021-01-12 NOTE — Consult Note (Signed)
NAME: [name] [name]  DOB: Sep 01, 1970  MRN: [id number]  Date/Time: 01/12/2021 11:43 AM  REQUESTING PROVIDER: Dr.Pokhrel Subjective:  REASON FOR CONSULT: urosepsis ? [name] [name] is a 50 y.o. with a history of DM, CAD, STEMI, HTN, CVA, followed at [place] presented to the ED on 01/09/21 with dysuria and hematuria Pt says he suddenly developed hematuria- prior to that he has was having some burning urination- Did not have fever at home Feeling very tired, body ache In the ED vitals 99.2, BP 132/99, HR 110, labs wbc 14, HB 12.8, PLT 184 Blood and urine culture sent and he was started on aztreonam,. As he continued to spike fever and urine culture was proteus the antibiotic was changed to levaquin yesterday-I am asked to see the patient for the same  He had initially  presented to [facility] ED on 9/11 with vomiting and abdominal pain and CT done that day showed left colon loaded with stool and a 5.4 cm bilobed cyst with layering calcification in the left interpolar region. 2 mm interpolar right renal calculus (coronal image 87). No hydronephrosis.  Pt says he lives on his won- he is being evicted as he has not paid rent this month- his cat was already taken by the county. He says he has no place to go  Past Medical History:  Diagnosis Date   Diabetes mellitus without complication (HCC)    Hypertension    Stroke [facility])     Past Surgical History:  Procedure Laterality Date   APPENDECTOMY     BACK SURGERY     CORONARY/GRAFT ACUTE MI REVASCULARIZATION N/A 01/23/2020   Procedure: Coronary/Graft Acute MI Revascularization;  Surgeon: Alwyn Pea, MD;  Location: [facility] INVASIVE CV LAB;  Service: Cardiovascular;  Laterality: N/A;   LEFT HEART CATH AND CORONARY ANGIOGRAPHY N/A 01/23/2020   Procedure: LEFT HEART CATH AND CORONARY ANGIOGRAPHY;  Surgeon: Alwyn Pea, MD;  Location: [facility] INVASIVE CV LAB;  Service: Cardiovascular;  Laterality: N/A;    Social History   Socioeconomic History   Marital  status: Divorced    Spouse name: Not on file   Number of children: Not on file   Years of education: Not on file   Highest education level: Not on file  Occupational History   Not on file  Tobacco Use   Smoking status: Every Day    Packs/day: 0.25    Types: Cigarettes   Smokeless tobacco: Never  Vaping Use   Vaping Use: Never used  Substance and Sexual Activity   Alcohol use: No   Drug use: No   Sexual activity: Not on file  Other Topics Concern   Not on file  Social History Narrative   Not on file   Social Determinants of Health   Financial Resource Strain: Not on file  Food Insecurity: Not on file  Transportation Needs: Not on file  Physical Activity: Not on file  Stress: Not on file  Social Connections: Not on file  Intimate Partner Violence: Not on file    Family History  Problem Relation Age of Onset   Stroke Mother    Heart failure Mother    Heart failure Father    Diabetes Father    Allergies  Allergen Reactions   Penicillins Other (See Comments) and Anaphylaxis    Has patient had a PCN reaction causing immediate rash, facial/tongue/throat swelling, SOB or lightheadedness with hypotension: Yes Has patient had a PCN reaction causing severe rash involving mucus membranes or skin necrosis: No  Has patient had a PCN reaction that required hospitalization: No Has patient had a PCN reaction occurring within the last 10 years: No If all of the above answers are "NO", then may proceed with Cephalosporin use.    I? Current Facility-Administered Medications  Medication Dose Route Frequency Provider Last Rate Last Admin   acetaminophen (TYLENOL) tablet 650 mg  650 mg Oral Q6H PRN Rayne Du, MD   650 mg at 01/12/21 0739   Or   acetaminophen (TYLENOL) suppository 650 mg  650 mg Rectal Q6H PRN Rayne Du, MD       amLODipine (NORVASC) tablet 10 mg  10 mg Oral Daily Rayne Du, MD   10 mg at 01/12/21 0739   aspirin EC tablet 81 mg  81 mg Oral Daily Rayne Du, MD   81 mg at 01/12/21 0739   atorvastatin (LIPITOR) tablet 80 mg  80 mg Oral Daily Rayne Du, MD   80 mg at 01/12/21 0739   buPROPion (WELLBUTRIN XL) 24 hr tablet 150 mg  150 mg Oral Daily Rayne Du, MD   150 mg at 01/12/21 0739   carvedilol (COREG) tablet 25 mg  25 mg Oral BID Rayne Du, MD   25 mg at 01/12/21 0739   citalopram (CELEXA) tablet 40 mg  40 mg Oral Daily Rayne Du, MD   40 mg at 01/12/21 0739   clopidogrel (PLAVIX) tablet 75 mg  75 mg Oral Daily Pokhrel, Laxman, MD   75 mg at 01/12/21 0739   hydrALAZINE (APRESOLINE) injection 5 mg  5 mg Intravenous Q4H PRN Rayne Du, MD       insulin aspart (novoLOG) injection 0-15 Units  0-15 Units Subcutaneous TID AC & HS Rayne Du, MD   3 Units at 01/12/21 0748   insulin aspart (novoLOG) injection 4 Units  4 Units Subcutaneous TID WC Pokhrel, Laxman, MD   4 Units at 01/12/21 0748   insulin glargine-yfgn (SEMGLEE) injection 25 Units  25 Units Subcutaneous QHS Pokhrel, Laxman, MD   25 Units at 01/11/21 2109   levofloxacin (LEVAQUIN) IVPB 750 mg  750 mg Intravenous Q24H Pokhrel, Laxman, MD 100 mL/hr at 01/12/21 0553 750 mg at 01/12/21 0553   metoCLOPramide (REGLAN) tablet 10 mg  10 mg Oral Q12H PRN Rayne Du, MD       multivitamin with minerals tablet 1 tablet  1 tablet Oral Daily Rayne Du, MD   1 tablet at 01/12/21 5397   oxyCODONE (Oxy IR/ROXICODONE) immediate release tablet 5 mg  5 mg Oral Q6H PRN Rayne Du, MD       pantoprazole (PROTONIX) EC tablet 20 mg  20 mg Oral Daily Rayne Du, MD   20 mg at 01/12/21 0746   potassium chloride SA (KLOR-CON) CR tablet 40 mEq  40 mEq Oral TID Pokhrel, Laxman, MD   40 mEq at 01/12/21 0745   sodium chloride flush (NS) 0.9 % injection 3 mL  3 mL Intravenous Q12H Rayne Du, MD   3 mL at 01/12/21 0748   tamsulosin (FLOMAX) capsule 0.4 mg  0.4 mg Oral Daily Rayne Du, MD   0.4 mg at 01/12/21 6734     Abtx:  Anti-infectives (From admission, onward)     Start     Dose/Rate Route Frequency Ordered Stop   01/12/21 0600  levofloxacin (LEVAQUIN) IVPB 750 mg        750 mg 100 mL/hr over 90 Minutes Intravenous Every 24 hours 01/11/21 1650     01/10/21 2200  aztreonam (  AZACTAM) 2 g in sodium chloride 0.9 % 100 mL IVPB        2 g 200 mL/hr over 30 Minutes Intravenous Every 8 hours 01/10/21 2111 01/11/21 2136   01/10/21 0600  aztreonam (AZACTAM) 2 g in sodium chloride 0.9 % 100 mL IVPB  Status:  Discontinued        2 g 200 mL/hr over 30 Minutes Intravenous Every 12 hours 01/09/21 2026 01/10/21 2111   01/09/21 1830  aztreonam (AZACTAM) 2 g in sodium chloride 0.9 % 100 mL IVPB        2 g 200 mL/hr over 30 Minutes Intravenous  Once 01/09/21 1820 01/10/21 0027       REVIEW OF SYSTEMS:  Const:  fever,  chills, negative weight loss Eyes: negative diplopia or visual changes, negative eye pain ENT: negative coryza, negative sore throat Resp: negative cough, hemoptysis, dyspnea Cards: negative for chest pain, palpitations, lower extremity edema GU:  dysuria and hematuria GI: Negative for abdominal pain, diarrhea, bleeding, constipation Skin: negative for rash and pruritus Heme: negative for easy bruising and gum/nose bleeding MS:  muscle weakness Neurolo:h/o 2 cva- left side and rt side- was told it was a folate problem Psych:  depression  Endocrine:  diabetes Allergy/Immunology- penicillin- swelling of his face when he was in the [employer] 50 yrs old Says he ahs taken amoxicillin with no issues but does not remember when: Objective:  VITALS:  BP 125/78 (BP Location: Left Arm)   Pulse 92   Temp 99.7 F (37.6 C) (Oral)   Resp 20   Ht 5\' 9"  (1.753 m)   Wt 81.6 kg   SpO2 90%   BMI 26.58 kg/m  PHYSICAL EXAM:  General: Alert, cooperative, no distress, appears stated age. pale Head: Normocephalic, without obvious abnormality, atraumatic. Eyes: Conjunctivae clear, anicteric sclerae. Pupils are equal ENT Nares normal. No drainage or sinus  tenderness. Lips, mucosa, and tongue normal. No Thrush Neck: Supple, symmetrical, no adenopathy, thyroid: non tender no carotid bruit and no JVD. Back: No CVA tenderness. Lungs: Clear to auscultation bilaterally. No Wheezing or Rhonchi. No rales. Heart: Regular rate and rhythm, no murmur, rub or gallop. Abdomen: Soft, non-tender,not distended. Bowel sounds normal. No masses Extremities: atraumatic, no cyanosis. No edema. No clubbing Skin: No rashes or lesions. Or bruising Lymph: Cervical, supraclavicular normal. Neurologic: Grossly non-focal Pertinent Labs Lab Results CBC    Component Value Date/Time   WBC 10.9 (H) 01/12/2021 0415   RBC 4.49 01/12/2021 0415   HGB 13.9 01/12/2021 0415   HGB 15.0 03/09/2013 1727   HCT 39.0 01/12/2021 0415   HCT 42.5 03/09/2013 1727   PLT 185 01/12/2021 0415   PLT 192 03/09/2013 1727   MCV 86.9 01/12/2021 0415   MCV 86 03/09/2013 1727   MCH 31.0 01/12/2021 0415   MCHC 35.6 01/12/2021 0415   RDW 11.5 01/12/2021 0415   RDW 12.2 03/09/2013 1727   LYMPHSABS 1.6 01/09/2021 1340   MONOABS 0.9 01/09/2021 1340   EOSABS 0.1 01/09/2021 1340   BASOSABS 0.1 01/09/2021 1340    CMP Latest Ref Rng & Units 01/12/2021 01/11/2021 01/10/2021  Glucose 70 - 99 mg/dL 389(H) 734(K) 876(O)  BUN 6 - 20 mg/dL 16 19 11(X)  Creatinine 0.61 - 1.24 mg/dL 7.26 2.03 5.59  Sodium 135 - 145 mmol/L 135 134(L) 133(L)  Potassium 3.5 - 5.1 mmol/L 2.7(LL) 3.6 3.1(L)  Chloride 98 - 111 mmol/L 92(L) 90(L) 91(L)  CO2 22 - 32 mmol/L 32 31 31  Calcium 8.9 -  10.3 mg/dL 9.0 9.3 9.3  Total Protein 6.5 - 8.1 g/dL - - -  Total Bilirubin 0.3 - 1.2 mg/dL - - -  Alkaline Phos 38 - 126 U/L - - -  AST 15 - 41 U/L - - -  ALT 0 - 44 U/L - - -      Microbiology: Recent Results (from the past 240 hour(s))  Urine Culture     Status: Abnormal   Collection Time: 01/09/21  1:39 PM   Specimen: Urine, Random  Result Value Ref Range Status   Specimen Description   Final    URINE,  RANDOM Performed at [facility], [address] [place], [place] [address]    Special Requests   Final    NONE Performed at [facility], [address], [place], [place] [address]    Culture >=100,000 COLONIES/mL PROTEUS MIRABILIS (A)  Final   Report Status 01/11/2021 FINAL  Final   Organism ID, Bacteria PROTEUS MIRABILIS (A)  Final      Susceptibility   Proteus mirabilis - MIC*    AMPICILLIN <=2 SENSITIVE Sensitive     CEFAZOLIN <=4 SENSITIVE Sensitive     CEFEPIME <=0.12 SENSITIVE Sensitive     CEFTRIAXONE <=0.25 SENSITIVE Sensitive     CIPROFLOXACIN <=0.25 SENSITIVE Sensitive     GENTAMICIN <=1 SENSITIVE Sensitive     IMIPENEM 2 SENSITIVE Sensitive     NITROFURANTOIN RESISTANT Resistant     TRIMETH/SULFA <=20 SENSITIVE Sensitive     AMPICILLIN/SULBACTAM <=2 SENSITIVE Sensitive     PIP/TAZO <=4 SENSITIVE Sensitive     * >=100,000 COLONIES/mL PROTEUS MIRABILIS  Resp Panel by RT-PCR (Flu A&B, Covid) Nasopharyngeal Swab     Status: None   Collection Time: 01/09/21  3:27 PM   Specimen: Nasopharyngeal Swab; Nasopharyngeal(NP) swabs in vial transport medium  Result Value Ref Range Status   SARS Coronavirus 2 by RT PCR NEGATIVE NEGATIVE Final    Comment: (NOTE) SARS-CoV-2 target nucleic acids are NOT DETECTED.  The SARS-CoV-2 RNA is generally detectable in upper respiratory specimens during the acute phase of infection. The lowest concentration of SARS-CoV-2 viral copies this assay can detect is 138 copies/mL. A negative result does not preclude SARS-Cov-2 infection and should not be used as the sole basis for treatment or other patient management decisions. A negative result may occur with  improper specimen collection/handling, submission of specimen other than nasopharyngeal swab, presence of viral mutation(s) within the areas targeted by this assay, and inadequate number of viral copies(<138 copies/mL). A negative result must be combined with clinical  observations, patient history, and epidemiological information. The expected result is Negative.  Fact Sheet for Patients:  BloggerCourse.com  Fact Sheet for Healthcare Providers:  SeriousBroker.it  This test is no t yet approved or cleared by the [place] FDA and  has been authorized for detection and/or diagnosis of SARS-CoV-2 by FDA under an Emergency Use Authorization (EUA). This EUA will remain  in effect (meaning this test can be used) for the duration of the COVID-19 declaration under Section 564(b)(1) of the Act, 21 U.S.C.section 360bbb-3(b)(1), unless the authorization is terminated  or revoked sooner.       Influenza A by PCR NEGATIVE NEGATIVE Final   Influenza B by PCR NEGATIVE NEGATIVE Final    Comment: (NOTE) The Xpert Xpress SARS-CoV-2/FLU/RSV plus assay is intended as an aid in the diagnosis of influenza from Nasopharyngeal swab specimens and should not be used as a sole basis for treatment. Nasal washings  and aspirates are unacceptable for Xpert Xpress SARS-CoV-2/FLU/RSV testing.  Fact Sheet for Patients: BloggerCourse.com  Fact Sheet for Healthcare Providers: SeriousBroker.it  This test is not yet approved or cleared by the [place] FDA and has been authorized for detection and/or diagnosis of SARS-CoV-2 by FDA under an Emergency Use Authorization (EUA). This EUA will remain in effect (meaning this test can be used) for the duration of the COVID-19 declaration under Section 564(b)(1) of the Act, 21 U.S.C. section 360bbb-3(b)(1), unless the authorization is terminated or revoked.  Performed at [facility], [address] [place], [place] [address]   Blood culture (single)     Status: None (Preliminary result)   Collection Time: 01/09/21  6:36 PM   Specimen: BLOOD  Result Value Ref Range Status   Specimen Description BLOOD LEFT  ANTECUBITAL  Final   Special Requests   Final    BOTTLES DRAWN AEROBIC AND ANAEROBIC Blood Culture adequate volume   Culture   Final    NO GROWTH 3 DAYS Performed at [facility], [address], [place], [place] [address]    Report Status PENDING  Incomplete    IMAGING RESULTS:  I have personally reviewed the films ?Complex cyst of the left mid kidney roughly similar to prior exams Impression/Recommendation Complicated UTI with proteus with a bilobed renal cyst with layering calium in it. So the fever is slow to resolve especially on aztreonam butnow switched to levaquin. This will help to clear the infection quickly as better renal concentration?. Will give antibiotic until 01/18/21 Got urology opinion and they dont think the cyst is infected ? DM  CVA  CAD? ___________________________________________________ Discussed with patient, requesting provider Note:  This document was prepared using Dragon voice recognition software and may include unintentional dictation errors.

## 2021-01-12 NOTE — Evaluation (Signed)
Occupational Therapy Evaluation Patient Details Name: [name] [name] MRN: [id number] DOB: December 17, 1970 Today's Date: 01/12/2021   History of Present Illness [name] [name] is a 50 y.o. male with past medical history of ST elevation MI, CVA, hypertension, diabetes mellitus on insulin, gastroparesis, anxiety depression presented to hospital with dysuria, hematuria without obvious fever but had some chills   Clinical Impression   [name] [name] was seen for OT evaluation this date. Prior to hospital admission, pt was MOD I for mobility and ADLs using w/c. Pt lives alone, reports no residence currently to return to. Pt presents to acute OT demonstrating impaired ADL performance and functional mobility 2/2 decreased activity tolerance and functional strength/balance deficits.   Pt currently requires MIN A for bed mobility from flat bed. SETUP don B socks at bed level. Attempted bed>recliner t/f to simulate home w/c t/f, pt unable to clear rear without physical assist. Pt endorses fatigue/nausea and self-initiates return to bed. Returns to sitting and completes lateral scoot t/f MOD A, again endorses fatigue/nausea and returns to bed, noted to be dry heaving, RN notified.   Pt would benefit from skilled OT to address noted impairments to maximize safety and independence while minimizing falls risk and caregiver burden. Upon hospital discharge, recommend STR to maximize pt safety and return to PLOF.  SUPINE: BP 111/78, MAP 89, HR 79 SEATED: BP 92/74, MAP 81, HR 86      Recommendations for follow up therapy are one component of a multi-disciplinary discharge planning process, led by the attending physician.  Recommendations may be updated based on patient status, additional functional criteria and insurance authorization.   Follow Up Recommendations  SNF    Equipment Recommendations  Other (comment) (TBD at next venue of care)    Recommendations for Other Services       Precautions / Restrictions  Precautions Precautions: Fall Restrictions Weight Bearing Restrictions: No      Mobility Bed Mobility Overal bed mobility: Needs Assistance Bed Mobility: Supine to Sit;Sit to Supine     Supine to sit: Min assist Sit to supine: Min assist   General bed mobility comments: from flat bed with no rails to simulate home    Transfers Overall transfer level: Needs assistance   Transfers: Lateral/Scoot Transfers;Sit to/from Stand Sit to Stand: Mod assist        Lateral/Scoot Transfers: Mod assist General transfer comment: Unable to clear rear without assist for sit<>stand. Unable to trial SPT 2/2 nausea in sitting    Balance Overall balance assessment: Needs assistance Sitting-balance support: No upper extremity supported;Feet supported Sitting balance-Leahy Scale: Fair     Standing balance support: Single extremity supported Standing balance-Leahy Scale: Zero                             ADL either performed or assessed with clinical judgement   ADL Overall ADL's : Needs assistance/impaired                                       General ADL Comments: SETUP don B socks at bed level. MOD A for ADL t/f      Pertinent Vitals/Pain Pain Assessment: Faces Faces Pain Scale: Hurts even more Pain Location: dry heaving Pain Descriptors / Indicators: Grimacing;Discomfort Pain Intervention(s): Limited activity within patient's tolerance;Other (comment) (RN requested nausea)     Hand Dominance Left (R hemi,  cannot write L handed)   Extremity/Trunk Assessment Upper Extremity Assessment Upper Extremity Assessment: RUE deficits/detail RUE Deficits / Details: baseline R hemiplegia, grossly 3/5   Lower Extremity Assessment Lower Extremity Assessment: RLE deficits/detail RLE Deficits / Details: baseline RLE hemiplegia       Communication Communication Communication: No difficulties   Cognition Arousal/Alertness: Awake/alert Behavior During  Therapy: Flat affect Overall Cognitive Status: Within Functional Limits for tasks assessed                                     General Comments  SUPINE: BP 111/78, MAP 89, HR 79. SEATED: BP 92/74, MAP 81, HR 86    Exercises Exercises: Other exercises Other Exercises Other Exercises: Pt educated re: OT role, DME recs, d/c recs, falls prevention, ECS Other Exercises: LBD, sup<>sit x3, sitting balance/tolerance, sit<>stand, self-drinking   Shoulder Instructions      Home Living Family/patient expects to be discharged to:: Shelter/Homeless                                 Additional Comments: Pt reports getting evicted this week, unclear if he has access to his w/c      Prior Functioning/Environment Level of Independence: Independent with assistive device(s)        Comments: w/c level baseline, completes all ADLs MOD I        OT Problem List: Decreased strength;Decreased range of motion;Decreased activity tolerance;Impaired balance (sitting and/or standing);Decreased safety awareness      OT Treatment/Interventions: Self-care/ADL training;Therapeutic exercise;Energy conservation;DME and/or AE instruction;Therapeutic activities;Balance training;Patient/family education    OT Goals(Current goals can be found in the care plan section) Acute Rehab OT Goals Patient Stated Goal: to return to PLOF OT Goal Formulation: With patient Time For Goal Achievement: 01/26/21 Potential to Achieve Goals: Good ADL Goals Pt Will Perform Grooming: sitting;with modified independence Pt Will Transfer to Toilet: with modified independence;stand pivot transfer;bedside commode Additional ADL Goal #1: Pt will complete w/c level cooking/cleaning task c MOD I  OT Frequency: Min 1X/week   Barriers to D/C: Inaccessible home environment;Decreased caregiver support             AM-PAC OT "6 Clicks" Daily Activity     Outcome Measure Help from another person eating  meals?: None Help from another person taking care of personal grooming?: A Little Help from another person toileting, which includes using toliet, bedpan, or urinal?: A Lot Help from another person bathing (including washing, rinsing, drying)?: A Lot Help from another person to put on and taking off regular upper body clothing?: A Little Help from another person to put on and taking off regular lower body clothing?: A Little 6 Click Score: 17   End of Session Nurse Communication: Other (comment);Mobility status (request nauesea meds)  Activity Tolerance: Patient tolerated treatment well Patient left: in bed;with call bell/phone within reach;with bed alarm set  OT Visit Diagnosis: Other abnormalities of gait and mobility (R26.89)                Time: 8676-1950 OT Time Calculation (min): 23 min Charges:  OT General Charges $OT Visit: 1 Visit OT Evaluation $OT Eval Low Complexity: 1 Low OT Treatments $Self Care/Home Management : 8-22 mins  Kathie Dike, M.S. OTR/L  01/12/21, 3:20 PM  ascom [phone]

## 2021-01-12 NOTE — TOC Progression Note (Signed)
Transition of Care [facility]) - Progression Note    Patient Details  Name: [name] [name] MRN: [id number] Date of Birth: 1970/08/30  Transition of Care [facility]) CM/SW Contact  Shelbie Hutching, RN Phone Number: 01/12/2021, 2:33 PM  Clinical Narrative:    RNCM notified [facility] that patient [facility]  [facility] so [facility] not an [facility] at this time.   Met with patient at the bedside to provide resources on homelessness and a number to the [place] that he can call and they may be able to help.  Mentioned the [employer] and he said he couldn't go there, he had been there before and they kicked him out because he could not do the work as he is in a wheelchair.   Patient's wheelchair is not here at the hospital with him and reports that hopefully it is still on his front porch.   PT and OT will work with patient.  If patient qualifies may be able to get into [place] approved SNF.    Expected Discharge Plan: Homeless Shelter Barriers to Discharge: Continued Medical Work up  Expected Discharge Plan and Services Expected Discharge Plan: Homeless Shelter   Discharge Planning Services: CM Consult   Living arrangements for the past 2 months: Apartment                 DME Arranged: N/A DME Agency: NA       HH Arranged: NA           Social Determinants of Health (SDOH) Interventions    Readmission Risk Interventions No flowsheet data found.

## 2021-01-12 NOTE — Progress Notes (Addendum)
PROGRESS NOTE  CASPIAN [name] FSE:[id number] DOB: 1970/05/05 DOA: 01/09/2021 [facility]   LOS: 3 days   Brief narrative:  [name] [name] is a 50 y.o. male with past medical history of ST elevation MI, CVA, hypertension, diabetes mellitus on insulin, gastroparesis, anxiety depression presented to hospital with dysuria, hematuria without obvious fever but had some chills.  In the ED, patient had mild grade fever at 99.2 F with tachycardia, tachypnea.  Creatinine was elevated 1.4.  Glucose at 320.  Lactic acid was elevated.  WBC was elevated as well.  Urinalysis showed white cells more than 50.  CT abdomen showed bilateral punctuate nonobstructive obstructive renal calculi .  Patient was given azactam and was admitted hospital for sepsis secondary to UTI.  During hospitalization, patient was noted to have a Proteus UTI.  He however continued to have fever so infectious disease has been consulted.   Assessment/Plan:  Principal Problem:   Sepsis (HCC) Active Problems:   Essential hypertension, benign   Type II diabetes mellitus with neurological manifestations, uncontrolled (HCC)   Acute lower UTI   Hematuria  Sepsis secondary to Proteus UTI with hematuria: Spike of fever despite antibiotic. Present on admission as evidenced by tachycardia, leukocytosis and elevated lactate with UTI. Patient received IV azactam but has been changed to Levaquin urine culture with Proteus.  Blood cultures negative in 3 days.  Aspirin and Plavix has been resumed.  We will consult infectious disease due to ongoing fevers despite appropriate antibiotic.  Temperature max of 103 F.  Significant hypokalemia.  Still with hypokalemia.  We will continue replacing IV and orally.  Nephrolithiasis.   CT shows nonobstructive small renal stone   Essential hypertension:  Has improved at this time.  Continue carvedilol, hydralazine, amlodipine.  Continue to hold diuretics for now.   Diabetes mellitus  type II.,  Continue sliding-scale insulin, long-acting insulin, Accu-Cheks.  Patient is on Victoza and metformin at home.  Dose of insulin has been adjusted.  Diabetic coordinator on board.  Latest POC glucose of 152.   Chronic medical conditions include history significant of STEMI, CVA, hypertension, diabetes on insulin, Gastroparesis,  anxiety/depression.  Continue aspirin and Plavix.  History of stroke with wheelchair-bound status.  On aspirin and Plavix at home.  Continue Lipitor.  Continue aspirin and Plavix.  DVT prophylaxis: SCDs Start: 01/09/21 2017   Code Status: Full code  Family Communication: None  Status is: Inpatient  Remains inpatient appropriate because:IV treatments appropriate due to intensity of illness or inability to take PO and Inpatient level of care appropriate due to severity of illness  Dispo: The patient is from: Home              Anticipated d/c is to: Home likely in 1 to 2 days.              Patient currently is not medically stable to d/c.   Difficult to place patient No  Consultants: Infectious disease  Procedures: None   Anti-infectives:  Levaquin   Anti-infectives (From admission, onward)    Start     Dose/Rate Route Frequency Ordered Stop   01/12/21 0600  levofloxacin (LEVAQUIN) IVPB 750 mg        750 mg 100 mL/hr over 90 Minutes Intravenous Every 24 hours 01/11/21 1650     01/10/21 2200  aztreonam (AZACTAM) 2 g in sodium chloride 0.9 % 100 mL IVPB        2 g 200 mL/hr over 30 Minutes  Intravenous Every 8 hours 01/10/21 2111 01/11/21 2136   01/10/21 0600  aztreonam (AZACTAM) 2 g in sodium chloride 0.9 % 100 mL IVPB  Status:  Discontinued        2 g 200 mL/hr over 30 Minutes Intravenous Every 12 hours 01/09/21 2026 01/10/21 2111   01/09/21 1830  aztreonam (AZACTAM) 2 g in sodium chloride 0.9 % 100 mL IVPB        2 g 200 mL/hr over 30 Minutes Intravenous  Once 01/09/21 1820 01/10/21 0027      Subjective: Today, patient was seen and  examined at bedside.  Complains of neck pain and right chest wall pain without any shortness of breath.  No nausea, vomiting but continues to have a spike of fever.  Complains of a pressure while urinating but no obvious dysuria.  Objective: Vitals:   01/12/21 0313 01/12/21 0718  BP: 131/90 125/78  Pulse: 80 92  Resp: 18 20  Temp: 99.2 F (37.3 C) 99.7 F (37.6 C)  SpO2: 94% 90%    Intake/Output Summary (Last 24 hours) at 01/12/2021 0945 Last data filed at 01/12/2021 0751 Gross per 24 hour  Intake 2843.39 ml  Output 1800 ml  Net 1043.39 ml    Filed Weights   01/09/21 1337  Weight: 81.6 kg   Body mass index is 26.58 kg/m.   Physical Exam: General:  Average built, not in obvious distress HENT:   No scleral pallor or icterus noted. Oral mucosa is moist.  Neck discomfort noted. Chest:  Diminished breath sounds bilaterally. No crackles or wheezes.  Right chest wall discomfort noted. CVS: S1 &S2 heard. No murmur.  Regular rate and rhythm. Abdomen: Soft, nontender, nondistended.  Bowel sounds are heard.   Extremities: No cyanosis, clubbing or edema.  Peripheral pulses are palpable. Psych: Alert, awake and oriented, normal mood CNS:  No cranial nerve deficits.  Moves all extremities. Skin: Warm and dry.  No rashes noted.   Data Review: I have personally reviewed the following laboratory data and studies,  CBC: Recent Labs  Lab 01/09/21 1340 01/10/21 0450 01/11/21 0421 01/12/21 0415  WBC 14.0* 20.1* 13.2* 10.9*  NEUTROABS 11.3*  --   --   --   HGB 12.8* 15.2 14.2 13.9  HCT 37.6* 43.8 41.8 39.0  MCV 90.2 86.9 85.7 86.9  PLT 184 221 204 185    Basic Metabolic Panel: Recent Labs  Lab 01/10/21 0450 01/10/21 1112 01/10/21 1731 01/11/21 0421 01/12/21 0415  NA 135 135 133* 134* 135  K 2.8* 3.0* 3.1* 3.6 2.7*  CL 93* 92* 91* 90* 92*  CO2 29 32 31 31 32  GLUCOSE 254* 255* 222* 151* 150*  BUN 25* 24* 22* 19 16  CREATININE 1.26* 1.26* 1.18 1.11 1.08  CALCIUM 9.7 9.4  9.3 9.3 9.0  MG  --   --   --  1.9  --   PHOS  --   --   --  2.7  --     Liver Function Tests: Recent Labs  Lab 01/09/21 1441  AST 22  ALT 42  ALKPHOS 81  BILITOT 1.0  PROT 8.5*  ALBUMIN 4.1    No results for input(s): LIPASE, AMYLASE in the last 168 hours. No results for input(s): AMMONIA in the last 168 hours. Cardiac Enzymes: No results for input(s): CKTOTAL, CKMB, CKMBINDEX, TROPONINI in the last 168 hours. BNP (last 3 results) No results for input(s): BNP in the last 8760 hours.  ProBNP (last 3 results) No  results for input(s): PROBNP in the last 8760 hours.  CBG: Recent Labs  Lab 01/11/21 1553 01/11/21 2038 01/12/21 0112 01/12/21 0441 01/12/21 0717  GLUCAP 354* 309* 204* 184* 152*    Recent Results (from the past 240 hour(s))  Urine Culture     Status: Abnormal   Collection Time: 01/09/21  1:39 PM   Specimen: Urine, Random  Result Value Ref Range Status   Specimen Description   Final    URINE, RANDOM Performed at [facility], [address], [place], [place] [address]    Special Requests   Final    NONE Performed at [facility], [address] [place], [place] [address]    Culture >=100,000 COLONIES/mL PROTEUS MIRABILIS (A)  Final   Report Status 01/11/2021 FINAL  Final   Organism ID, Bacteria PROTEUS MIRABILIS (A)  Final      Susceptibility   Proteus mirabilis - MIC*    AMPICILLIN <=2 SENSITIVE Sensitive     CEFAZOLIN <=4 SENSITIVE Sensitive     CEFEPIME <=0.12 SENSITIVE Sensitive     CEFTRIAXONE <=0.25 SENSITIVE Sensitive     CIPROFLOXACIN <=0.25 SENSITIVE Sensitive     GENTAMICIN <=1 SENSITIVE Sensitive     IMIPENEM 2 SENSITIVE Sensitive     NITROFURANTOIN RESISTANT Resistant     TRIMETH/SULFA <=20 SENSITIVE Sensitive     AMPICILLIN/SULBACTAM <=2 SENSITIVE Sensitive     PIP/TAZO <=4 SENSITIVE Sensitive     * >=100,000 COLONIES/mL PROTEUS MIRABILIS  Resp Panel by RT-PCR (Flu A&B, Covid) Nasopharyngeal Swab     Status:  None   Collection Time: 01/09/21  3:27 PM   Specimen: Nasopharyngeal Swab; Nasopharyngeal(NP) swabs in vial transport medium  Result Value Ref Range Status   SARS Coronavirus 2 by RT PCR NEGATIVE NEGATIVE Final    Comment: (NOTE) SARS-CoV-2 target nucleic acids are NOT DETECTED.  The SARS-CoV-2 RNA is generally detectable in upper respiratory specimens during the acute phase of infection. The lowest concentration of SARS-CoV-2 viral copies this assay can detect is 138 copies/mL. A negative result does not preclude SARS-Cov-2 infection and should not be used as the sole basis for treatment or other patient management decisions. A negative result may occur with  improper specimen collection/handling, submission of specimen other than nasopharyngeal swab, presence of viral mutation(s) within the areas targeted by this assay, and inadequate number of viral copies(<138 copies/mL). A negative result must be combined with clinical observations, patient history, and epidemiological information. The expected result is Negative.  Fact Sheet for Patients:  BloggerCourse.com  Fact Sheet for Healthcare Providers:  SeriousBroker.it  This test is no t yet approved or cleared by the [place] FDA and  has been authorized for detection and/or diagnosis of SARS-CoV-2 by FDA under an Emergency Use Authorization (EUA). This EUA will remain  in effect (meaning this test can be used) for the duration of the COVID-19 declaration under Section 564(b)(1) of the Act, 21 U.S.C.section 360bbb-3(b)(1), unless the authorization is terminated  or revoked sooner.       Influenza A by PCR NEGATIVE NEGATIVE Final   Influenza B by PCR NEGATIVE NEGATIVE Final    Comment: (NOTE) The Xpert Xpress SARS-CoV-2/FLU/RSV plus assay is intended as an aid in the diagnosis of influenza from Nasopharyngeal swab specimens and should not be used as a sole basis for  treatment. Nasal washings and aspirates are unacceptable for Xpert Xpress SARS-CoV-2/FLU/RSV testing.  Fact Sheet for Patients: BloggerCourse.com  Fact Sheet for Healthcare Providers: SeriousBroker.it  This  test is not yet approved or cleared by the [place] and has been authorized for detection and/or diagnosis of SARS-CoV-2 by FDA under an Emergency Use Authorization (EUA). This EUA will remain in effect (meaning this test can be used) for the duration of the COVID-19 declaration under Section 564(b)(1) of the Act, 21 U.S.C. section 360bbb-3(b)(1), unless the authorization is terminated or revoked.  Performed at [facility], [address] [place], [place] [address]   Blood culture (single)     Status: None (Preliminary result)   Collection Time: 01/09/21  6:36 PM   Specimen: BLOOD  Result Value Ref Range Status   Specimen Description BLOOD LEFT ANTECUBITAL  Final   Special Requests   Final    BOTTLES DRAWN AEROBIC AND ANAEROBIC Blood Culture adequate volume   Culture   Final    NO GROWTH 3 DAYS Performed at [facility], [address], [place], [place] [address]    Report Status PENDING  Incomplete      Studies: No results found.    Joycelyn Das, MD  [facility] 01/12/2021  If 7PM-7AM, please contact night-coverage

## 2021-01-13 DIAGNOSIS — A419 Sepsis, unspecified organism: Secondary | ICD-10-CM | POA: Diagnosis not present

## 2021-01-13 DIAGNOSIS — A4159 Other Gram-negative sepsis: Secondary | ICD-10-CM | POA: Diagnosis not present

## 2021-01-13 DIAGNOSIS — I10 Essential (primary) hypertension: Secondary | ICD-10-CM | POA: Diagnosis not present

## 2021-01-13 DIAGNOSIS — N390 Urinary tract infection, site not specified: Secondary | ICD-10-CM | POA: Diagnosis not present

## 2021-01-13 DIAGNOSIS — R319 Hematuria, unspecified: Secondary | ICD-10-CM | POA: Diagnosis not present

## 2021-01-13 LAB — CBC
HCT: 38.4 % — ABNORMAL LOW (ref 39.0–52.0)
Hemoglobin: 13.8 g/dL (ref 13.0–17.0)
MCH: 31.4 pg (ref 26.0–34.0)
MCHC: 35.9 g/dL (ref 30.0–36.0)
MCV: 87.3 fL (ref 80.0–100.0)
Platelets: 201 10*3/uL (ref 150–400)
RBC: 4.4 MIL/uL (ref 4.22–5.81)
RDW: 11.6 % (ref 11.5–15.5)
WBC: 8.1 10*3/uL (ref 4.0–10.5)
nRBC: 0 % (ref 0.0–0.2)

## 2021-01-13 LAB — BASIC METABOLIC PANEL
Anion gap: 9 (ref 5–15)
BUN: 16 mg/dL (ref 6–20)
CO2: 30 mmol/L (ref 22–32)
Calcium: 8.8 mg/dL — ABNORMAL LOW (ref 8.9–10.3)
Chloride: 94 mmol/L — ABNORMAL LOW (ref 98–111)
Creatinine, Ser: 1.04 mg/dL (ref 0.61–1.24)
GFR, Estimated: >60 mL/min (ref >60)
Glucose, Bld: 173 mg/dL — ABNORMAL HIGH (ref 70–99)
Potassium: 3.3 mmol/L — ABNORMAL LOW (ref 3.5–5.1)
Sodium: 133 mmol/L — ABNORMAL LOW (ref 135–145)

## 2021-01-13 LAB — GLUCOSE, CAPILLARY
Glucose-Capillary: 165 mg/dL — ABNORMAL HIGH (ref 70–99)
Glucose-Capillary: 196 mg/dL — ABNORMAL HIGH (ref 70–99)
Glucose-Capillary: 204 mg/dL — ABNORMAL HIGH (ref 70–99)
Glucose-Capillary: 231 mg/dL — ABNORMAL HIGH (ref 70–99)
Glucose-Capillary: 262 mg/dL — ABNORMAL HIGH (ref 70–99)
Glucose-Capillary: 270 mg/dL — ABNORMAL HIGH (ref 70–99)

## 2021-01-13 LAB — MAGNESIUM: Magnesium: 2.2 mg/dL (ref 1.7–2.4)

## 2021-01-13 MED ORDER — SIMETHICONE 80 MG PO CHEW
160.0000 mg | CHEWABLE_TABLET | Freq: Once | ORAL | Status: AC
  Administered 2021-01-13: 22:00:00 160 mg via ORAL
  Filled 2021-01-13: qty 2

## 2021-01-13 MED ORDER — INSULIN ASPART 100 UNIT/ML IJ SOLN
7.0000 [IU] | Freq: Three times a day (TID) | INTRAMUSCULAR | Status: DC
  Administered 2021-01-13 – 2021-01-17 (×12): 7 [IU] via SUBCUTANEOUS
  Filled 2021-01-13 (×12): qty 1

## 2021-01-13 MED ORDER — DICYCLOMINE HCL 10 MG PO CAPS
10.0000 mg | ORAL_CAPSULE | Freq: Three times a day (TID) | ORAL | Status: DC
  Administered 2021-01-13 – 2021-01-19 (×22): 10 mg via ORAL
  Filled 2021-01-13 (×27): qty 1

## 2021-01-13 MED ORDER — ONDANSETRON HCL 4 MG/2ML IJ SOLN
4.0000 mg | Freq: Four times a day (QID) | INTRAMUSCULAR | Status: DC | PRN
  Administered 2021-01-14: 04:00:00 4 mg via INTRAVENOUS
  Filled 2021-01-13: qty 2

## 2021-01-13 MED ORDER — INFLUENZA VAC SPLIT QUAD 0.5 ML IM SUSY
0.5000 mL | PREFILLED_SYRINGE | Freq: Once | INTRAMUSCULAR | Status: AC
  Administered 2021-01-13: 0.5 mL via INTRAMUSCULAR
  Filled 2021-01-13: qty 0.5

## 2021-01-13 MED ORDER — ALUM & MAG HYDROXIDE-SIMETH 200-200-20 MG/5ML PO SUSP
30.0000 mL | Freq: Four times a day (QID) | ORAL | Status: DC | PRN
  Administered 2021-01-13 – 2021-01-15 (×7): 30 mL via ORAL
  Filled 2021-01-13 (×7): qty 30

## 2021-01-13 MED ORDER — POTASSIUM CHLORIDE CRYS ER 20 MEQ PO TBCR
40.0000 meq | EXTENDED_RELEASE_TABLET | Freq: Two times a day (BID) | ORAL | Status: AC
  Administered 2021-01-13 – 2021-01-14 (×3): 40 meq via ORAL
  Filled 2021-01-13 (×3): qty 2

## 2021-01-13 MED ORDER — PANTOPRAZOLE SODIUM 40 MG PO TBEC
40.0000 mg | DELAYED_RELEASE_TABLET | Freq: Two times a day (BID) | ORAL | Status: DC
  Administered 2021-01-13 – 2021-01-19 (×12): 40 mg via ORAL
  Filled 2021-01-13 (×12): qty 1

## 2021-01-13 NOTE — Evaluation (Signed)
Physical Therapy Evaluation Patient Details Name: [name] [name] MRN: [id number] DOB: 1970/12/01 Today's Date: 01/13/2021  History of Present Illness  [name] is a 50yoM who comes to [facility] on 9/18 c blood in urine, also melena. PMH: STEMI, CVAx2, HTN, IDDM, gastroparesis, GAD, depression. Orthostatic BP on 9/21 at OT evaluation. At baseline pt uses a WC for basic mobility, household propulsion with mostly feet and LUE due to CVA related deficits.  Clinical Impression  Pt admitted with above diagnosis. Pt currently with functional limitations due to the deficits listed below (see "PT Problem List"). Upon entry, pt in bed, awake and agreeable to participate, despite nausea and ABD pain. The pt is alert, pleasant, interactive, and able to provide info regarding prior level of function, both in tolerance and independence. Pt performs bed mobility and stand pivot transfers without any required physical assistance. Pt has orthostatic BP drop that lasts for about 2 minutes without significant symptomology. WC propulsion deferred at present to allow NSG to take place, not needed for DC recommendations at this time, but will assess in future visit.Patient's performance this date reveals decreased ability, independence, and tolerance in performing all basic mobility required for performance of activities of daily living. Pt is markedly weaker than his baseline performance. Pt requires additional DME, close physical assistance, and cues for safe participate in mobility. Pt will benefit from skilled PT intervention to increase independence and safety with basic mobility in preparation for discharge to the venue listed below.     Orthostatic VS for the past 24 hrs (Last 3 readings):  BP- Lying Pulse- Lying BP- Sitting Pulse- Sitting  01/13/21 0921 134/86 74 114/89 91  *sitting pressures remain flat at 60sec, then show resolution at 2 minutes; pt has dizziness at 2 minutes, then after transition back to bed. Brief  screening suggests further BPPV assessment is warranted.         Recommendations for follow up therapy are one component of a multi-disciplinary discharge planning process, led by the attending physician.  Recommendations may be updated based on patient status, additional functional criteria and insurance authorization.  Follow Up Recommendations SNF;Supervision - Intermittent    Equipment Recommendations  Other (comment);Wheelchair (measurements PT);Wheelchair cushion (measurements PT) (WC is in disrepair, torn cushion; really unable to access community at present; power chair would make more sense for IADL)    Recommendations for Other Services       Precautions / Restrictions Precautions Precautions: Fall Restrictions Weight Bearing Restrictions: No      Mobility  Bed Mobility Overal bed mobility: Needs Assistance Bed Mobility: Supine to Sit;Sit to Supine     Supine to sit: Supervision Sit to supine: Supervision   General bed mobility comments: moves fairly well, but stability at EOB has increased postural sway, albeit no frank LOB    Transfers Overall transfer level: Needs assistance Equipment used: None Transfers: Stand Pivot Transfers   Stand pivot transfers: Min guard;Supervision       General transfer comment: Performed bed to [facility] to bed; well developed technique; appears labored; pt rpeorts "much harder" than baseline transfers  Ambulation/Gait Ambulation/Gait assistance:  (Pt does not AMB at baseline)              Stairs            Wheelchair Mobility    Modified Rankin (Stroke Patients Only)       Balance     Sitting balance-Leahy Scale: Good     Standing balance support: No upper  extremity supported                                 Pertinent Vitals/Pain Pain Assessment: 0-10 Pain Score: 4  Pain Location: ABD pain Pain Intervention(s): Limited activity within patient's tolerance    Home Living Family/patient  expects to be discharged to:: Shelter/Homeless                 Additional Comments: Pt reports getting evicted this week, WC in room at evaluation;    Prior Function Level of Independence: Independent with assistive device(s);Needs assistance   Gait / Transfers Assistance Needed: WC for household distances, stand pivot transfers (no walker or slide board used)  ADL's / Homemaking Assistance Needed: w/c level baseline, completes all ADLs ModI; does not leave apartment due to stairs.  Comments: w/c level baseline, completes all ADLs MOD I     Hand Dominance        Extremity/Trunk Assessment                Communication      Cognition Arousal/Alertness: Awake/alert Behavior During Therapy: WFL for tasks assessed/performed Overall Cognitive Status: Within Functional Limits for tasks assessed                                        General Comments      Exercises     Assessment/Plan    PT Assessment Patient needs continued PT services  PT Problem List Decreased strength;Decreased activity tolerance;Decreased balance;Decreased mobility;Decreased knowledge of use of DME;Decreased safety awareness;Decreased knowledge of precautions       PT Treatment Interventions DME instruction;Balance training;Functional mobility training;Therapeutic activities;Therapeutic exercise;Wheelchair mobility training;Patient/family education    PT Goals (Current goals can be found in the Care Plan section)  Acute Rehab PT Goals Patient Stated Goal: to return to PLOF PT Goal Formulation: With patient Time For Goal Achievement: 01/27/21 Potential to Achieve Goals: Good    Frequency Min 2X/week   Barriers to discharge Inaccessible home environment;Decreased caregiver support now evicted from home, no social support    Co-evaluation               AM-PAC PT "6 Clicks" Mobility  Outcome Measure Help needed turning from your back to your side while in a flat  bed without using bedrails?: A Little Help needed moving from lying on your back to sitting on the side of a flat bed without using bedrails?: A Little Help needed moving to and from a bed to a chair (including a wheelchair)?: A Little Help needed standing up from a chair using your arms (e.g., wheelchair or bedside chair)?: A Little Help needed to walk in hospital room?: Total Help needed climbing 3-5 steps with a railing? : Total 6 Click Score: 14    End of Session   Activity Tolerance: Patient tolerated treatment well;Patient limited by fatigue;No increased pain Patient left: in bed;with nursing/sitter in room Nurse Communication:  (symptoms with hypotension) PT Visit Diagnosis: Unsteadiness on feet (R26.81);Other abnormalities of gait and mobility (R26.89);Muscle weakness (generalized) (M62.81)    Time: 7782-4235 PT Time Calculation (min) (ACUTE ONLY): 25 min   Charges:   PT Evaluation $PT Eval Low Complexity: 1 Low        2:58 PM, 01/13/21 Rosamaria Lints, PT, DPT Physical Therapist - [facility]  Center  [phone] (ASCOM)    Elridge Stemm C 01/13/2021, 2:49 PM

## 2021-01-13 NOTE — TOC Progression Note (Signed)
Transition of Care [facility]) - Progression Note    Patient Details  Name: TRAVUS OREN MRN: [id number] Date of Birth: 05-18-1970  Transition of [facility]) CM/SW Contact  Allayne Butcher, RN Phone Number: 01/13/2021, 3:13 PM  Clinical Narrative:    PT and OT recommending SNF.  The patient's VA benefits will not cover SNF but Cathy at the [facility] recommended trying the inpatient rehab unit at the [place].  Referral faxed to [phone]-  tried to call the rehab [phone] ext [phone] but VM was full.   TOC to follow up tomorrow.  Patient reports that he has Medicare but Admitting is unable to verify that.     Expected Discharge Plan: Homeless Shelter Barriers to Discharge: Continued Medical Work up  Expected Discharge Plan and Services Expected Discharge Plan: Homeless Shelter   Discharge Planning Services: CM Consult   Living arrangements for the past 2 months: Apartment                 DME Arranged: N/A DME Agency: NA       HH Arranged: NA           Social Determinants of Health (SDOH) Interventions    Readmission Risk Interventions No flowsheet data found.

## 2021-01-13 NOTE — Progress Notes (Addendum)
Inpatient Diabetes Program Recommendations  AACE/ADA: New Consensus Statement on Inpatient Glycemic Control   Target Ranges:  Prepandial:   less than 140 mg/dL      Peak postprandial:   less than 180 mg/dL (1-2 hours)      Critically ill patients:  140 - 180 mg/dL   Results for ERASTO, SLEIGHT (MRN [id number]) as of 01/13/2021 11:08  Ref. Range 01/12/2021 07:17 01/12/2021 12:16 01/12/2021 16:21 01/12/2021 20:39 01/13/2021 04:44 01/13/2021 09:48  Glucose-Capillary Latest Ref Range: 70 - 99 mg/dL 998 (H) 338 (H) 250 (H) 307 (H) 204 (H) 262 (H)    Review of Glycemic Control  Diabetes history: DM2 Outpatient Diabetes medications: Lantus 40 units QHS, Novolog 10 units TID with meals, Jardiance 1/2 pill daily Current orders for Inpatient glycemic control: Novolog 0-15 units TID with meals and bedtime, Semglee 25 units QHS, Novolog 4 units TID with meals   Inpatient Diabetes Program Recommendations:     Insulin: Please consider increasing meal coverage to Novolog 7 units TID with meals if patient eats at least 50% of meals  Thanks,  Orlando Penner, RN, MSN, CDE Diabetes Coordinator Inpatient Diabetes Program [phone] (Team Pager from 8am to 5pm)

## 2021-01-13 NOTE — Progress Notes (Addendum)
PROGRESS NOTE  STYLES FAMBRO RXV:[id number] DOB: March 01, 1971 DOA: 01/09/2021 PCP: [facility]   LOS: 4 days   Brief narrative:  [name] [name] is a 50 y.o. male with past medical history of ST elevation MI, CVA, hypertension, diabetes mellitus on insulin, gastroparesis, anxiety depression presented to hospital with dysuria, hematuria without obvious fever but had some chills.  In the ED, patient had mild grade fever at 99.2 F with tachycardia, tachypnea.  Creatinine was elevated 1.4.  Glucose at 320.  Lactic acid was elevated.  WBC was elevated as well.  Urinalysis showed white cells more than 50.  CT abdomen showed bilateral punctuate nonobstructive obstructive renal calculi .  Patient was given azactam and was admitted hospital for sepsis secondary to UTI.  During hospitalization, patient was noted to have a Proteus UTI.  He however continued to have fever so infectious disease was consulted.  Patient was reevaluated by physical therapy who has recommended skilled nursing facility on discharge  Assessment/Plan:  Principal Problem:   Sepsis (HCC) Active Problems:   Essential hypertension, benign   Type II diabetes mellitus with neurological manifestations, uncontrolled (HCC)   Acute lower UTI   Hematuria  Sepsis secondary to Proteus UTI with hematuria/complex UTI: Temperature max of 99.3 F today.  Currently on Levaquin.  Initially was not azactam.  Infectious disease on board.  Significant hypokalemia.  Improved.  Continue p.o. potassium twice daily for now.  Check levels in a.m.  Nephrolithiasis with renal cyst..   CT shows nonobstructive small renal stone and renal cyst.  Urology had seen the scan yesterday and no need for any surgical intervention   Essential hypertension:   carvedilol, hydralazine, amlodipine.  Continue for now.  Blood pressure seems to be stable.  Diuretic on hold.   Diabetes mellitus type II with hyperglycemia.  Continue sliding-scale insulin,  long-acting insulin, Patient is on Victoza and metformin at home.  Diabetic coordinator on board and insulin dose adjusted.  Latest POC glucose of 262.   Chronic medical conditions include history significant of STEMI, CVA, hypertension, diabetes on insulin, Gastroparesis,  anxiety/depression.  Continue aspirin and Plavix.  History of stroke with wheelchair-bound status.  On aspirin and Plavix at home.  Continue Lipitor.  Continue aspirin and Plavix.  Physical therapy recommending skilled nursing facility at this time  DVT prophylaxis: SCDs Start: 01/09/21 2017   Code Status: Full code  Family Communication: None  Status is: Inpatient  Remains inpatient appropriate because:IV treatments appropriate due to intensity of illness or inability to take PO and Inpatient level of care appropriate due to severity of illness  Dispo: The patient is from: Home              Anticipated d/c is to: Skilled nursing facility as per PT recommendation.              Patient currently is not medically stable to d/c.   Difficult to place patient No  Consultants: Infectious disease  Procedures: None   Anti-infectives:  Levaquin   Anti-infectives (From admission, onward)    Start     Dose/Rate Route Frequency Ordered Stop   01/12/21 0600  levofloxacin (LEVAQUIN) IVPB 750 mg        750 mg 100 mL/hr over 90 Minutes Intravenous Every 24 hours 01/11/21 1650     01/10/21 2200  aztreonam (AZACTAM) 2 g in sodium chloride 0.9 % 100 mL IVPB        2 g 200 mL/hr over 30  Minutes Intravenous Every 8 hours 01/10/21 2111 01/11/21 2136   01/10/21 0600  aztreonam (AZACTAM) 2 g in sodium chloride 0.9 % 100 mL IVPB  Status:  Discontinued        2 g 200 mL/hr over 30 Minutes Intravenous Every 12 hours 01/09/21 2026 01/10/21 2111   01/09/21 1830  aztreonam (AZACTAM) 2 g in sodium chloride 0.9 % 100 mL IVPB        2 g 200 mL/hr over 30 Minutes Intravenous  Once 01/09/21 1820 01/10/21 0027       Subjective: Today, patient was seen and examined.  Denies any neck pain or chest wall pain today.  Feels nauseated but has had chronic nausea.  No fever or chills today.  Objective: Vitals:   01/13/21 0443 01/13/21 0823  BP: 122/82 123/83  Pulse: 67 74  Resp: 16 18  Temp: 99 F (37.2 C) 98.5 F (36.9 C)  SpO2: 93% 91%    Intake/Output Summary (Last 24 hours) at 01/13/2021 1005 Last data filed at 01/13/2021 0939 Gross per 24 hour  Intake 393 ml  Output 1225 ml  Net -832 ml    Filed Weights   01/09/21 1337  Weight: 81.6 kg   Body mass index is 26.58 kg/m.   Physical Exam: General:  Average built, not in obvious distress HENT:   No scleral pallor or icterus noted. Oral mucosa is moist.  Chest:  Clear breath sounds.  Diminished breath sounds bilaterally. No crackles or wheezes.  CVS: S1 &S2 heard. No murmur.  Regular rate and rhythm. Abdomen: Soft, nontender, nondistended.  Bowel sounds are heard.   Extremities: No cyanosis, clubbing or edema.  Peripheral pulses are palpable. Psych: Alert, awake and oriented, normal mood CNS:  No cranial nerve deficits.  Moves extremities. Skin: Warm and dry.  No rashes noted.   Data Review: I have personally reviewed the following laboratory data and studies,  CBC: Recent Labs  Lab 01/09/21 1340 01/10/21 0450 01/11/21 0421 01/12/21 0415 01/13/21 0547  WBC 14.0* 20.1* 13.2* 10.9* 8.1  NEUTROABS 11.3*  --   --   --   --   HGB 12.8* 15.2 14.2 13.9 13.8  HCT 37.6* 43.8 41.8 39.0 38.4*  MCV 90.2 86.9 85.7 86.9 87.3  PLT 184 221 204 185 201    Basic Metabolic Panel: Recent Labs  Lab 01/10/21 1112 01/10/21 1731 01/11/21 0421 01/12/21 0415 01/13/21 0547  NA 135 133* 134* 135 133*  K 3.0* 3.1* 3.6 2.7* 3.3*  CL 92* 91* 90* 92* 94*  CO2 32 31 31 32 30  GLUCOSE 255* 222* 151* 150* 173*  BUN 24* 22* 19 16 16   CREATININE 1.26* 1.18 1.11 1.08 1.04  CALCIUM 9.4 9.3 9.3 9.0 8.8*  MG  --   --  1.9  --  2.2  PHOS  --   --   2.7  --   --     Liver Function Tests: Recent Labs  Lab 01/09/21 1441  AST 22  ALT 42  ALKPHOS 81  BILITOT 1.0  PROT 8.5*  ALBUMIN 4.1    No results for input(s): LIPASE, AMYLASE in the last 168 hours. No results for input(s): AMMONIA in the last 168 hours. Cardiac Enzymes: No results for input(s): CKTOTAL, CKMB, CKMBINDEX, TROPONINI in the last 168 hours. BNP (last 3 results) No results for input(s): BNP in the last 8760 hours.  ProBNP (last 3 results) No results for input(s): PROBNP in the last 8760 hours.  CBG: Recent  Labs  Lab 01/12/21 1216 01/12/21 1621 01/12/21 2039 01/13/21 0444 01/13/21 0948  GLUCAP 199* 174* 307* 204* 262*    Recent Results (from the past 240 hour(s))  Urine Culture     Status: Abnormal   Collection Time: 01/09/21  1:39 PM   Specimen: Urine, Random  Result Value Ref Range Status   Specimen Description   Final    URINE, RANDOM Performed at [facility], [address] [place], [place] [address]    Special Requests   Final    NONE Performed at [facility], [address] [place], [place] [address]    Culture >=100,000 COLONIES/mL PROTEUS MIRABILIS (A)  Final   Report Status 01/11/2021 FINAL  Final   Organism ID, Bacteria PROTEUS MIRABILIS (A)  Final      Susceptibility   Proteus mirabilis - MIC*    AMPICILLIN <=2 SENSITIVE Sensitive     CEFAZOLIN <=4 SENSITIVE Sensitive     CEFEPIME <=0.12 SENSITIVE Sensitive     CEFTRIAXONE <=0.25 SENSITIVE Sensitive     CIPROFLOXACIN <=0.25 SENSITIVE Sensitive     GENTAMICIN <=1 SENSITIVE Sensitive     IMIPENEM 2 SENSITIVE Sensitive     NITROFURANTOIN RESISTANT Resistant     TRIMETH/SULFA <=20 SENSITIVE Sensitive     AMPICILLIN/SULBACTAM <=2 SENSITIVE Sensitive     PIP/TAZO <=4 SENSITIVE Sensitive     * >=100,000 COLONIES/mL PROTEUS MIRABILIS  Resp Panel by RT-PCR (Flu A&B, Covid) Nasopharyngeal Swab     Status: None   Collection Time: 01/09/21  3:27 PM   Specimen:  Nasopharyngeal Swab; Nasopharyngeal(NP) swabs in vial transport medium  Result Value Ref Range Status   SARS Coronavirus 2 by RT PCR NEGATIVE NEGATIVE Final    Comment: (NOTE) SARS-CoV-2 target nucleic acids are NOT DETECTED.  The SARS-CoV-2 RNA is generally detectable in upper respiratory specimens during the acute phase of infection. The lowest concentration of SARS-CoV-2 viral copies this assay can detect is 138 copies/mL. A negative result does not preclude SARS-Cov-2 infection and should not be used as the sole basis for treatment or other patient management decisions. A negative result may occur with  improper specimen collection/handling, submission of specimen other than nasopharyngeal swab, presence of viral mutation(s) within the areas targeted by this assay, and inadequate number of viral copies(<138 copies/mL). A negative result must be combined with clinical observations, patient history, and epidemiological information. The expected result is Negative.  Fact Sheet for Patients:  BloggerCourse.com  Fact Sheet for Healthcare Providers:  SeriousBroker.it  This test is no t yet approved or cleared by the [place] FDA and  has been authorized for detection and/or diagnosis of SARS-CoV-2 by FDA under an Emergency Use Authorization (EUA). This EUA will remain  in effect (meaning this test can be used) for the duration of the COVID-19 declaration under Section 564(b)(1) of the Act, 21 U.S.C.section 360bbb-3(b)(1), unless the authorization is terminated  or revoked sooner.       Influenza A by PCR NEGATIVE NEGATIVE Final   Influenza B by PCR NEGATIVE NEGATIVE Final    Comment: (NOTE) The Xpert Xpress SARS-CoV-2/FLU/RSV plus assay is intended as an aid in the diagnosis of influenza from Nasopharyngeal swab specimens and should not be used as a sole basis for treatment. Nasal washings and aspirates are unacceptable for  Xpert Xpress SARS-CoV-2/FLU/RSV testing.  Fact Sheet for Patients: BloggerCourse.com  Fact Sheet for Healthcare Providers: SeriousBroker.it  This test is not yet approved or cleared by the [place] FDA  and has been authorized for detection and/or diagnosis of SARS-CoV-2 by FDA under an Emergency Use Authorization (EUA). This EUA will remain in effect (meaning this test can be used) for the duration of the COVID-19 declaration under Section 564(b)(1) of the Act, 21 U.S.C. section 360bbb-3(b)(1), unless the authorization is terminated or revoked.  Performed at [facility], [address] [place], [place] [address]   Blood culture (single)     Status: None (Preliminary result)   Collection Time: 01/09/21  6:36 PM   Specimen: BLOOD  Result Value Ref Range Status   Specimen Description BLOOD LEFT ANTECUBITAL  Final   Special Requests   Final    BOTTLES DRAWN AEROBIC AND ANAEROBIC Blood Culture adequate volume   Culture   Final    NO GROWTH 4 DAYS Performed at [facility], [address], [place], [place] [address]    Report Status PENDING  Incomplete      Studies: No results found.    Joycelyn Das, MD  [facility] 01/13/2021  If 7PM-7AM, please contact night-coverage

## 2021-01-14 DIAGNOSIS — A419 Sepsis, unspecified organism: Secondary | ICD-10-CM | POA: Diagnosis not present

## 2021-01-14 DIAGNOSIS — B964 Proteus (mirabilis) (morganii) as the cause of diseases classified elsewhere: Secondary | ICD-10-CM

## 2021-01-14 DIAGNOSIS — N390 Urinary tract infection, site not specified: Secondary | ICD-10-CM | POA: Diagnosis not present

## 2021-01-14 DIAGNOSIS — R319 Hematuria, unspecified: Secondary | ICD-10-CM | POA: Diagnosis not present

## 2021-01-14 DIAGNOSIS — I10 Essential (primary) hypertension: Secondary | ICD-10-CM | POA: Diagnosis not present

## 2021-01-14 LAB — BASIC METABOLIC PANEL
Anion gap: 6 (ref 5–15)
Anion gap: 5 (ref 5–15)
BUN: 15 mg/dL (ref 6–20)
BUN: 17 mg/dL (ref 6–20)
CO2: 29 mmol/L (ref 22–32)
CO2: 31 mmol/L (ref 22–32)
Calcium: 8.8 mg/dL — ABNORMAL LOW (ref 8.9–10.3)
Calcium: 8.7 mg/dL — ABNORMAL LOW (ref 8.9–10.3)
Chloride: 98 mmol/L (ref 98–111)
Chloride: 96 mmol/L — ABNORMAL LOW (ref 98–111)
Creatinine, Ser: 0.91 mg/dL (ref 0.61–1.24)
Creatinine, Ser: 1.12 mg/dL (ref 0.61–1.24)
GFR, Estimated: >60 mL/min (ref >60)
GFR, Estimated: >60 mL/min (ref >60)
Glucose, Bld: 162 mg/dL — ABNORMAL HIGH (ref 70–99)
Glucose, Bld: 232 mg/dL — ABNORMAL HIGH (ref 70–99)
Potassium: 3.7 mmol/L (ref 3.5–5.1)
Potassium: 3.9 mmol/L (ref 3.5–5.1)
Sodium: 133 mmol/L — ABNORMAL LOW (ref 135–145)
Sodium: 132 mmol/L — ABNORMAL LOW (ref 135–145)

## 2021-01-14 LAB — CULTURE, BLOOD (SINGLE)
Culture: NO GROWTH
Special Requests: ADEQUATE

## 2021-01-14 LAB — CBC
HCT: 39.8 % (ref 39.0–52.0)
Hemoglobin: 13.9 g/dL (ref 13.0–17.0)
MCH: 30 pg (ref 26.0–34.0)
MCHC: 34.9 g/dL (ref 30.0–36.0)
MCV: 85.8 fL (ref 80.0–100.0)
Platelets: 253 10*3/uL (ref 150–400)
RBC: 4.64 MIL/uL (ref 4.22–5.81)
RDW: 11.6 % (ref 11.5–15.5)
WBC: 9.8 10*3/uL (ref 4.0–10.5)
nRBC: 0 % (ref 0.0–0.2)

## 2021-01-14 LAB — GLUCOSE, CAPILLARY
Glucose-Capillary: 148 mg/dL — ABNORMAL HIGH (ref 70–99)
Glucose-Capillary: 207 mg/dL — ABNORMAL HIGH (ref 70–99)
Glucose-Capillary: 191 mg/dL — ABNORMAL HIGH (ref 70–99)
Glucose-Capillary: 243 mg/dL — ABNORMAL HIGH (ref 70–99)
Glucose-Capillary: 210 mg/dL — ABNORMAL HIGH (ref 70–99)

## 2021-01-14 LAB — TROPONIN I (HIGH SENSITIVITY): Troponin I (High Sensitivity): 12 ng/L (ref <18)

## 2021-01-14 MED ORDER — SIMETHICONE 80 MG PO CHEW
160.0000 mg | CHEWABLE_TABLET | Freq: Four times a day (QID) | ORAL | Status: DC | PRN
  Administered 2021-01-16 (×2): 160 mg via ORAL
  Filled 2021-01-14 (×7): qty 2

## 2021-01-14 MED ORDER — FAMOTIDINE IN NACL 20-0.9 MG/50ML-% IV SOLN
20.0000 mg | Freq: Once | INTRAVENOUS | Status: DC
  Filled 2021-01-14: qty 50

## 2021-01-14 MED ORDER — SIMETHICONE 80 MG PO CHEW
160.0000 mg | CHEWABLE_TABLET | Freq: Once | ORAL | Status: AC
  Administered 2021-01-14: 160 mg via ORAL
  Filled 2021-01-14: qty 2

## 2021-01-14 MED ORDER — POTASSIUM CHLORIDE CRYS ER 20 MEQ PO TBCR
40.0000 meq | EXTENDED_RELEASE_TABLET | Freq: Every day | ORAL | Status: DC
  Administered 2021-01-15 – 2021-01-19 (×5): 40 meq via ORAL
  Filled 2021-01-14 (×5): qty 2

## 2021-01-14 MED ORDER — LEVOFLOXACIN 750 MG PO TABS
750.0000 mg | ORAL_TABLET | Freq: Every day | ORAL | Status: DC
  Administered 2021-01-15 – 2021-01-19 (×5): 750 mg via ORAL
  Filled 2021-01-14 (×5): qty 1

## 2021-01-14 MED ORDER — FAMOTIDINE 20 MG IN NS 100 ML IVPB
20.0000 mg | Freq: Once | INTRAVENOUS | Status: AC
  Administered 2021-01-14: 20 mg via INTRAVENOUS
  Filled 2021-01-14: qty 100

## 2021-01-14 MED ORDER — METOCLOPRAMIDE HCL 5 MG/ML IJ SOLN
10.0000 mg | Freq: Once | INTRAMUSCULAR | Status: AC
  Administered 2021-01-14: 23:00:00 10 mg via INTRAVENOUS
  Filled 2021-01-14: qty 2

## 2021-01-14 NOTE — Progress Notes (Signed)
PHARMACIST - PHYSICIAN COMMUNICATION DR:  Tyson Babinski CONCERNING: Antibiotic IV to Oral Route Change Policy  RECOMMENDATION: This patient is receiving levofloxacin by the intravenous route.  Based on criteria approved by the Pharmacy and Therapeutics Committee, the antibiotic(s) is/are being converted to the equivalent oral dose form(s).   DESCRIPTION: These criteria include: Patient being treated for a respiratory tract infection, urinary tract infection, cellulitis or clostridium difficile associated diarrhea if on metronidazole The patient is not neutropenic and does not exhibit a GI malabsorption state The patient is eating (either orally or via tube) and/or has been taking other orally administered medications for a least 24 hours The patient is improving clinically and has a Tmax < 100.5  If you have questions about this conversion, please contact the Pharmacy Department  []   [phone] )  ( [phone] [x]   [phone] )  [facility] []   [phone] )  [facility] []   [phone] )  [facility] []   [phone] )  [facility]

## 2021-01-14 NOTE — Progress Notes (Signed)
ID Patient is doing okay Says  has some abdominal pain. No dysuria No flank pain  O/e awake and alert Pale No distress Patient Vitals for the past 24 hrs:  BP Temp Temp src Pulse Resp SpO2  01/14/21 2023 116/80 99.4 F (37.4 C) Oral 69 16 94 %  01/14/21 1536 110/73 98.4 F (36.9 C) -- 78 17 93 %  01/14/21 1137 127/88 98.3 F (36.8 C) Oral 70 16 93 %  01/14/21 0942 120/90 -- -- -- -- --  01/14/21 0941 120/90 -- -- 79 -- --  01/14/21 0718 114/86 97.8 F (36.6 C) Oral 70 18 94 %  01/14/21 0356 (!) 135/92 98.7 F (37.1 C) Oral 65 16 94 %  01/13/21 2343 113/86 98.8 F (37.1 C) Oral 67 17 98 %     Chest CTA Hss1s2 ABd soft CNS non focal  Labs  CBC Latest Ref Rng & Units 01/14/2021 01/13/2021 01/12/2021  WBC 4.0 - 10.5 K/uL 9.8 8.1 10.9(H)  Hemoglobin 13.0 - 17.0 g/dL 46.5 68.1 27.5  Hematocrit 39.0 - 52.0 % 39.8 38.4(L) 39.0  Platelets 150 - 400 K/uL 253 201 185    CMP Latest Ref Rng & Units 01/14/2021 01/13/2021 01/12/2021  Glucose 70 - 99 mg/dL 170(Y) 174(B) 449(Q)  BUN 6 - 20 mg/dL 15 16 16   Creatinine 0.61 - 1.24 mg/dL 7.59 1.63  Sodium 135 - 145 mmol/L 133(L) 133(L) 135  Potassium 3.5 - 5.1 mmol/L 3.7 3.3(L) 2.7(LL)  Chloride 98 - 111 mmol/L 98 94(L) 92(L)  CO2 22 - 32 mmol/L 29 30 32  Calcium 8.9 - 10.3 mg/dL 8.46) 6.5(L) 9.0  Total Protein 6.5 - 8.1 g/dL - - -  Total Bilirubin 0.3 - 1.2 mg/dL - - -  Alkaline Phos 38 - 126 U/L - - -  AST 15 - 41 U/L - - -  ALT 0 - 44 U/L - - -    Micro Urine culture Proteus Blood culture from 01/09/2021 no growth  Imaging Left renal cystic lesion with a highly density marginal element bilobed appearance with a septation and some calcification along the inferior margin of the dominant exophytic portion.  Impression/recommendation Complicated UTI with Proteus.  Patient has a bilobed renal cyst on the left side with layering calcium in it.  This as per urologist has been stable and not the reason for his current  presentation.  But the cyst is septated and hence possible that infection will be slow to clear from it.  Patient is currently on day 6 of antibiotic.  And day 4 of Levaquin.  Patient defervesced only after starting Levaquin.  We will give Levaquin for 7 more days.  Initial plan was to give Levaquin until 01/18/2021.  Now we will give it until 01/21/2021.  Diabetes mellitus.  While on Levaquin observe sugar closely to avoid hypoglycemia Gastroparesis.  History of CVA  History of CAD  Discussed the management with the patient. ID will sign off.  Call if needed.

## 2021-01-14 NOTE — Progress Notes (Signed)
PROGRESS NOTE  KIERRE HINTZ LGX:[id number] DOB: 06/28/1970 DOA: 01/09/2021 PCP: [facility]   LOS: 5 days   Brief narrative:  [name] [name] is a 50 y.o. male with past medical history of ST elevation MI, CVA, hypertension, diabetes mellitus on insulin, gastroparesis, anxiety depression presented to hospital with dysuria, hematuria without obvious fever but had some chills.  In the ED, patient had mild grade fever at 99.2 F with tachycardia, tachypnea.  Creatinine was elevated 1.4.  Glucose at 320.  Lactic acid was elevated.  WBC was elevated as well.  Urinalysis showed white cells more than 50.  CT abdomen showed bilateral punctuate nonobstructive obstructive renal calculi .  Patient was given azactam and was admitted hospital for sepsis secondary to UTI.  During hospitalization, patient was noted to have a Proteus UTI.  He however continued to have fever so infectious disease was consulted.  Patient was reevaluated by physical therapy who has recommended skilled nursing facility on discharge.  Assessment/Plan:  Principal Problem:   Sepsis (HCC) Active Problems:   Essential hypertension, benign   Type II diabetes mellitus with neurological manifestations, uncontrolled (HCC)   Acute lower UTI   Hematuria  Sepsis secondary to Proteus UTI with hematuria/complex UTI: Temperature max of 99.9 F today.  Currently on Levaquin.  Initially was on azactam.  Infectious disease on board.  Plan is to continue Levaquin until 01/18/2021.  Significant hypokalemia.  Improved.  Continue to replenish orally.  Nephrolithiasis with renal cyst..   CT shows nonobstructive small renal stone and renal cyst.  Urology had seen the scan and recommended no need for any surgical intervention   Essential hypertension:  Continue carvedilol, hydralazine, amlodipine.   Diuretic on hold.   Diabetes mellitus type II with hyperglycemia.  Continue sliding-scale insulin, long-acting insulin, Patient is on  Victoza and metformin at home.  Diabetic coordinator on board and insulin dose adjusted.  Latest POC glucose of 191  Chronic medical conditions include history significant of STEMI, CVA, hypertension, diabetes on insulin, Gastroparesis,  anxiety/depression.  Continue aspirin and Plavix.  History of stroke with wheelchair-bound status.  On aspirin and Plavix at home.  Continue Lipitor, aspirin and Plavix.  Physical therapy recommending skilled nursing facility at this time  DVT prophylaxis: SCDs Start: 01/09/21 2017   Code Status: Full code  Family Communication: None  Status is: Inpatient  Remains inpatient appropriate because:IV treatments appropriate due to intensity of illness or inability to take PO and Inpatient level of care appropriate due to severity of illness  Dispo: The patient is from: Home              Anticipated d/c is to: Skilled nursing facility as per PT recommendation.              Patient currently is medically stable to d/c.   Difficult to place patient No  Consultants: Infectious disease  Procedures: None   Anti-infectives:  Levaquin   Anti-infectives (From admission, onward)    Start     Dose/Rate Route Frequency Ordered Stop   01/12/21 0600  levofloxacin (LEVAQUIN) IVPB 750 mg        750 mg 100 mL/hr over 90 Minutes Intravenous Every 24 hours 01/11/21 1650     01/10/21 2200  aztreonam (AZACTAM) 2 g in sodium chloride 0.9 % 100 mL IVPB        2 g 200 mL/hr over 30 Minutes Intravenous Every 8 hours 01/10/21 2111 01/11/21 2136   01/10/21 0600  aztreonam (AZACTAM) 2 g in sodium chloride 0.9 % 100 mL IVPB  Status:  Discontinued        2 g 200 mL/hr over 30 Minutes Intravenous Every 12 hours 01/09/21 2026 01/10/21 2111   01/09/21 1830  aztreonam (AZACTAM) 2 g in sodium chloride 0.9 % 100 mL IVPB        2 g 200 mL/hr over 30 Minutes Intravenous  Once 01/09/21 1820 01/10/21 0027      Subjective: Today, patient was seen and examined at bedside.   Complained that his stomach burn and pain has improved.  Objective: Vitals:   01/14/21 0942 01/14/21 1137  BP: 120/90 127/88  Pulse:  70  Resp:  16  Temp:  98.3 F (36.8 C)  SpO2:  93%    Intake/Output Summary (Last 24 hours) at 01/14/2021 1223 Last data filed at 01/14/2021 1211 Gross per 24 hour  Intake --  Output 2000 ml  Net -2000 ml    Filed Weights   01/09/21 1337  Weight: 81.6 kg   Body mass index is 26.58 kg/m.   Physical Exam: General:  Average built, not in obvious distress HENT:   No scleral pallor or icterus noted. Oral mucosa is moist.  Chest:  .  Diminished breath sounds bilaterally. No crackles or wheezes.  CVS: S1 &S2 heard. No murmur.  Regular rate and rhythm. Abdomen: Soft, nontender, nondistended.  Bowel sounds are heard.   Extremities: No cyanosis, clubbing or edema.  Peripheral pulses are palpable. Psych: Alert, awake and oriented, normal mood CNS:  No cranial nerve deficits.    Moving all extremities. Skin: Warm and dry.  No rashes noted.   Data Review: I have personally reviewed the following laboratory data and studies,  CBC: Recent Labs  Lab 01/09/21 1340 01/10/21 0450 01/11/21 0421 01/12/21 0415 01/13/21 0547 01/14/21 0751  WBC 14.0* 20.1* 13.2* 10.9* 8.1 9.8  NEUTROABS 11.3*  --   --   --   --   --   HGB 12.8* 15.2 14.2 13.9 13.8 13.9  HCT 37.6* 43.8 41.8 39.0 38.4* 39.8  MCV 90.2 86.9 85.7 86.9 87.3 85.8  PLT 184 221 204 185 201 253    Basic Metabolic Panel: Recent Labs  Lab 01/10/21 1731 01/11/21 0421 01/12/21 0415 01/13/21 0547 01/14/21 0751  NA 133* 134* 135 133* 133*  K 3.1* 3.6 2.7* 3.3* 3.7  CL 91* 90* 92* 94* 98  CO2 31 31 32 30 29  GLUCOSE 222* 151* 150* 173* 162*  BUN 22* 19 16 16 15   CREATININE 1.18 1.11 1.08 1.04 0.91  CALCIUM 9.3 9.3 9.0 8.8* 8.8*  MG  --  1.9  --  2.2  --   PHOS  --  2.7  --   --   --     Liver Function Tests: Recent Labs  Lab 01/09/21 1441  AST 22  ALT 42  ALKPHOS 81  BILITOT  1.0  PROT 8.5*  ALBUMIN 4.1    No results for input(s): LIPASE, AMYLASE in the last 168 hours. No results for input(s): AMMONIA in the last 168 hours. Cardiac Enzymes: No results for input(s): CKTOTAL, CKMB, CKMBINDEX, TROPONINI in the last 168 hours. BNP (last 3 results) No results for input(s): BNP in the last 8760 hours.  ProBNP (last 3 results) No results for input(s): PROBNP in the last 8760 hours.  CBG: Recent Labs  Lab 01/13/21 1952 01/13/21 2339 01/14/21 0357 01/14/21 0717 01/14/21 1124  GLUCAP 165* 231* 148* 207*  191*    Recent Results (from the past 240 hour(s))  Urine Culture     Status: Abnormal   Collection Time: 01/09/21  1:39 PM   Specimen: Urine, Random  Result Value Ref Range Status   Specimen Description   Final    URINE, RANDOM Performed at [facility], [address] [place], [place] [address]    Special Requests   Final    NONE Performed at [facility], [address] [place], [place] [address]    Culture >=100,000 COLONIES/mL PROTEUS MIRABILIS (A)  Final   Report Status 01/11/2021 FINAL  Final   Organism ID, Bacteria PROTEUS MIRABILIS (A)  Final      Susceptibility   Proteus mirabilis - MIC*    AMPICILLIN <=2 SENSITIVE Sensitive     CEFAZOLIN <=4 SENSITIVE Sensitive     CEFEPIME <=0.12 SENSITIVE Sensitive     CEFTRIAXONE <=0.25 SENSITIVE Sensitive     CIPROFLOXACIN <=0.25 SENSITIVE Sensitive     GENTAMICIN <=1 SENSITIVE Sensitive     IMIPENEM 2 SENSITIVE Sensitive     NITROFURANTOIN RESISTANT Resistant     TRIMETH/SULFA <=20 SENSITIVE Sensitive     AMPICILLIN/SULBACTAM <=2 SENSITIVE Sensitive     PIP/TAZO <=4 SENSITIVE Sensitive     * >=100,000 COLONIES/mL PROTEUS MIRABILIS  Resp Panel by RT-PCR (Flu A&B, Covid) Nasopharyngeal Swab     Status: None   Collection Time: 01/09/21  3:27 PM   Specimen: Nasopharyngeal Swab; Nasopharyngeal(NP) swabs in vial transport medium  Result Value Ref Range Status   SARS  Coronavirus 2 by RT PCR NEGATIVE NEGATIVE Final    Comment: (NOTE) SARS-CoV-2 target nucleic acids are NOT DETECTED.  The SARS-CoV-2 RNA is generally detectable in upper respiratory specimens during the acute phase of infection. The lowest concentration of SARS-CoV-2 viral copies this assay can detect is 138 copies/mL. A negative result does not preclude SARS-Cov-2 infection and should not be used as the sole basis for treatment or other patient management decisions. A negative result may occur with  improper specimen collection/handling, submission of specimen other than nasopharyngeal swab, presence of viral mutation(s) within the areas targeted by this assay, and inadequate number of viral copies(<138 copies/mL). A negative result must be combined with clinical observations, patient history, and epidemiological information. The expected result is Negative.  Fact Sheet for Patients:  BloggerCourse.com  Fact Sheet for Healthcare Providers:  SeriousBroker.it  This test is no t yet approved or cleared by the [place] FDA and  has been authorized for detection and/or diagnosis of SARS-CoV-2 by FDA under an Emergency Use Authorization (EUA). This EUA will remain  in effect (meaning this test can be used) for the duration of the COVID-19 declaration under Section 564(b)(1) of the Act, 21 U.S.C.section 360bbb-3(b)(1), unless the authorization is terminated  or revoked sooner.       Influenza A by PCR NEGATIVE NEGATIVE Final   Influenza B by PCR NEGATIVE NEGATIVE Final    Comment: (NOTE) The Xpert Xpress SARS-CoV-2/FLU/RSV plus assay is intended as an aid in the diagnosis of influenza from Nasopharyngeal swab specimens and should not be used as a sole basis for treatment. Nasal washings and aspirates are unacceptable for Xpert Xpress SARS-CoV-2/FLU/RSV testing.  Fact Sheet for  Patients: BloggerCourse.com  Fact Sheet for Healthcare Providers: SeriousBroker.it  This test is not yet approved or cleared by the [place] FDA and has been authorized for detection and/or diagnosis of SARS-CoV-2 by FDA under an Emergency Use Authorization (EUA). This  EUA will remain in effect (meaning this test can be used) for the duration of the COVID-19 declaration under Section 564(b)(1) of the Act, 21 U.S.C. section 360bbb-3(b)(1), unless the authorization is terminated or revoked.  Performed at [facility], [address] [place], [place] [address]   Blood culture (single)     Status: None   Collection Time: 01/09/21  6:36 PM   Specimen: BLOOD  Result Value Ref Range Status   Specimen Description BLOOD LEFT ANTECUBITAL  Final   Special Requests   Final    BOTTLES DRAWN AEROBIC AND ANAEROBIC Blood Culture adequate volume   Culture   Final    NO GROWTH 5 DAYS Performed at [facility], [address], [place], [place] [address]    Report Status 01/14/2021 FINAL  Final      Studies: No results found.    Joycelyn Das, MD  [facility] 01/14/2021  If 7PM-7AM, please contact night-coverage

## 2021-01-14 NOTE — TOC Progression Note (Signed)
Transition of Care [facility]) - Progression Note    Patient Details  Name: [name] [name] MRN: [id number] Date of Birth: Sep 11, 1970  Transition of Care [facility]) CM/SW Contact  Caryn Section, RN Phone Number: 01/14/2021, 10:19 AM  Clinical Narrative:   Left message for [facility] for [place], awaiting return call    Expected Discharge Plan: Homeless Shelter Barriers to Discharge: Continued Medical Work up  Expected Discharge Plan and Services Expected Discharge Plan: Homeless Shelter   Discharge Planning Services: CM Consult   Living arrangements for the past 2 months: Apartment                 DME Arranged: N/A DME Agency: NA       HH Arranged: NA           Social Determinants of Health (SDOH) Interventions    Readmission Risk Interventions No flowsheet data found.

## 2021-01-14 NOTE — Progress Notes (Signed)
Patient complained of chest pain 7/10.Vitals signs done on call Provider notified.EKG ordered.

## 2021-01-15 DIAGNOSIS — R319 Hematuria, unspecified: Secondary | ICD-10-CM | POA: Diagnosis not present

## 2021-01-15 DIAGNOSIS — A419 Sepsis, unspecified organism: Secondary | ICD-10-CM | POA: Diagnosis not present

## 2021-01-15 DIAGNOSIS — N390 Urinary tract infection, site not specified: Secondary | ICD-10-CM | POA: Diagnosis not present

## 2021-01-15 DIAGNOSIS — I10 Essential (primary) hypertension: Secondary | ICD-10-CM | POA: Diagnosis not present

## 2021-01-15 LAB — GLUCOSE, CAPILLARY
Glucose-Capillary: 166 mg/dL — ABNORMAL HIGH (ref 70–99)
Glucose-Capillary: 192 mg/dL — ABNORMAL HIGH (ref 70–99)
Glucose-Capillary: 265 mg/dL — ABNORMAL HIGH (ref 70–99)
Glucose-Capillary: 237 mg/dL — ABNORMAL HIGH (ref 70–99)

## 2021-01-15 LAB — BASIC METABOLIC PANEL
Anion gap: 5 (ref 5–15)
BUN: 14 mg/dL (ref 6–20)
CO2: 29 mmol/L (ref 22–32)
Calcium: 8.6 mg/dL — ABNORMAL LOW (ref 8.9–10.3)
Chloride: 100 mmol/L (ref 98–111)
Creatinine, Ser: 0.93 mg/dL (ref 0.61–1.24)
GFR, Estimated: >60 mL/min (ref >60)
Glucose, Bld: 168 mg/dL — ABNORMAL HIGH (ref 70–99)
Potassium: 3.8 mmol/L (ref 3.5–5.1)
Sodium: 134 mmol/L — ABNORMAL LOW (ref 135–145)

## 2021-01-15 LAB — TROPONIN I (HIGH SENSITIVITY)
Troponin I (High Sensitivity): 15 ng/L (ref <18)
Troponin I (High Sensitivity): 16 ng/L (ref <18)

## 2021-01-15 MED ORDER — SODIUM CHLORIDE 0.9 % IV SOLN: INTRAVENOUS | Status: DC

## 2021-01-15 MED ORDER — SODIUM CHLORIDE 0.9 % IV SOLN: INTRAVENOUS | Status: AC

## 2021-01-15 MED ORDER — NITROGLYCERIN 0.4 MG SL SUBL
0.4000 mg | SUBLINGUAL_TABLET | SUBLINGUAL | Status: DC | PRN
  Administered 2021-01-15 – 2021-01-16 (×2): 0.4 mg via SUBLINGUAL
  Filled 2021-01-15 (×2): qty 1

## 2021-01-15 MED ORDER — ENOXAPARIN SODIUM 40 MG/0.4ML IJ SOSY
40.0000 mg | PREFILLED_SYRINGE | INTRAMUSCULAR | Status: DC
  Administered 2021-01-15 – 2021-01-18 (×4): 40 mg via SUBCUTANEOUS
  Filled 2021-01-15 (×4): qty 0.4

## 2021-01-15 NOTE — Plan of Care (Signed)
Patient alert and oriented x 4,no acute distress.Patient complained of chest pain over the shift EKG done,labs ordered see chart.Patient medicated with IV  Pepcid x 1 and IV Reglan as ordered.IV normal saline progressing at 100 ml /hr.Vital signs stable safety measures maintained Problem: Pain Managment: Goal: General experience of comfort will improve Outcome: Progressing   Problem: Safety: Goal: Ability to remain free from injury will improve Outcome: Progressing   Problem: Activity: Goal: Risk for activity intolerance will decrease Outcome: Progressing

## 2021-01-15 NOTE — Plan of Care (Signed)

## 2021-01-15 NOTE — Progress Notes (Signed)
Patient sleeping, breathing easy and unlabored, easily arousable. Denies chest pain at this.  No other complaints.

## 2021-01-15 NOTE — Progress Notes (Signed)
Attended to patient's call.  Patient c/o chest pain, pointing to mid-sternal area.  Denies radiating pain, denies SOB nor chest tightness.  VSS, saturation on RA 94%.  O2 placed at 2L [place] for comfort.  Dr. Tyson Babinski rounding, at bedside, aware of patient's complaints.  With new orders made.  Pt in no distress, needs addressed.

## 2021-01-15 NOTE — Progress Notes (Signed)
PROGRESS NOTE  [name] [name]:[id number] DOB: [birth year] DOA: 01/09/2021 PCP: [facility]   LOS: 6 days   Brief narrative:  [name] [name] is a 50 y.o. male with past medical history of ST elevation MI, CVA, hypertension, diabetes mellitus on insulin, gastroparesis, anxiety depression presented to hospital with dysuria, hematuria without obvious fever but had some chills.  In the ED, patient had mild grade fever at 99.2 F with tachycardia, tachypnea.  Creatinine was elevated 1.4.  Glucose at 320.  Lactic acid was elevated.  WBC was elevated as well.  Urinalysis showed white cells more than 50.  CT abdomen showed bilateral punctuate nonobstructive obstructive renal calculi .  Patient was given azactam and was admitted hospital for sepsis secondary to UTI.  During hospitalization, patient was noted to have Proteus UTI.  He however continued to have fever so infectious disease was consulted.  Patient was reevaluated by physical therapy who has recommended skilled nursing facility on discharge.  Assessment/Plan:  Principal Problem:   Sepsis (HCC) Active Problems:   Essential hypertension, benign   Type II diabetes mellitus with neurological manifestations, uncontrolled (HCC)   Acute lower UTI   Hematuria  Sepsis secondary to Proteus UTI with hematuria/complex UTI:   Currently on Levaquin.  Initially was on azactam.  Infectious disease on board.  Plan is to continue Levaquin until 01/21/2021.  Chest pain.  We will get the EKG, troponins and nitroglycerin.  Likely musculoskeletal chest pain.  Significant hypokalemia.  Improved.  Nephrolithiasis with renal cyst..   CT shows nonobstructive small renal stone and renal cyst.  Urology had seen the scan and recommended no need for any surgical intervention   Essential hypertension:  Continue carvedilol, hydralazine, amlodipine.   Diuretic on hold.   Diabetes mellitus type II with hyperglycemia.  Continue sliding-scale insulin,  long-acting insulin, Patient is on Victoza and metformin at home.  Diabetic coordinator on board and insulin dose adjusted.  Latest POC glucose of 166.  Chronic medical conditions include history significant of STEMI, CVA, hypertension, diabetes on insulin, Gastroparesis,  anxiety/depression.  Continue aspirin and Plavix.  History of stroke with wheelchair-bound status.  On aspirin and Plavix at home.  Continue Lipitor, aspirin and Plavix.  Physical therapy recommending skilled nursing facility at this time  DVT prophylaxis: SCDs Start: 01/09/21 2017   Code Status: Full code  Family Communication: None  Status is: Inpatient  Remains inpatient appropriate because:IV treatments appropriate due to intensity of illness or inability to take PO and Inpatient level of care appropriate due to severity of illness  Dispo: The patient is from: Home              Anticipated d/c is to: Skilled nursing facility as per PT recommendation.              Patient currently is medically stable to d/c.   Difficult to place patient No  Consultants: Infectious disease  Procedures: None   Anti-infectives:  Levaquin   Anti-infectives (From admission, onward)    Start     Dose/Rate Route Frequency Ordered Stop   01/15/21 0600  levofloxacin (LEVAQUIN) tablet 750 mg        750 mg Oral Daily 01/14/21 1410     01/12/21 0600  levofloxacin (LEVAQUIN) IVPB 750 mg  Status:  Discontinued        750 mg 100 mL/hr over 90 Minutes Intravenous Every 24 hours 01/11/21 1650 01/14/21 1410   01/10/21 2200  aztreonam (AZACTAM) 2 g  in sodium chloride 0.9 % 100 mL IVPB        2 g 200 mL/hr over 30 Minutes Intravenous Every 8 hours 01/10/21 2111 01/11/21 2136   01/10/21 0600  aztreonam (AZACTAM) 2 g in sodium chloride 0.9 % 100 mL IVPB  Status:  Discontinued        2 g 200 mL/hr over 30 Minutes Intravenous Every 12 hours 01/09/21 2026 01/10/21 2111   01/09/21 1830  aztreonam (AZACTAM) 2 g in sodium chloride 0.9 % 100  mL IVPB        2 g 200 mL/hr over 30 Minutes Intravenous  Once 01/09/21 1820 01/10/21 0027      Subjective: Today, patient was seen and examined at bedside.  Patient complains of chest discomfort like pressure in nature.  Has some shortness of breath.  Denies any fever or chills.  Objective: Vitals:   01/15/21 0552 01/15/21 0746  BP: 131/88 (!) 147/92  Pulse: 74 71  Resp: 16   Temp: 99 F (37.2 C) 98.2 F (36.8 C)  SpO2: 93% 94%    Intake/Output Summary (Last 24 hours) at 01/15/2021 0952 Last data filed at 01/15/2021 0851 Gross per 24 hour  Intake 1717.25 ml  Output 2400 ml  Net -682.75 ml    Filed Weights   01/09/21 1337  Weight: 81.6 kg   Body mass index is 26.58 kg/m.   Physical Exam: General:  Average built, not in obvious distress HENT:   No scleral pallor or icterus noted. Oral mucosa is moist.  Chest: .  Diminished breath sounds bilaterally. No crackles or wheezes.  CVS: S1 &S2 heard. No murmur.  Regular rate and rhythm. Abdomen: Soft, nontender, nondistended.  Bowel sounds are heard.   Extremities: No cyanosis, clubbing or edema.  Peripheral pulses are palpable. Psych: Alert, awake and oriented, normal mood CNS:  No cranial nerve deficits.  Moving all extremities Skin: Warm and dry.  No rashes noted.   Data Review: I have personally reviewed the following laboratory data and studies,  CBC: Recent Labs  Lab 01/09/21 1340 01/10/21 0450 01/11/21 0421 01/12/21 0415 01/13/21 0547 01/14/21 0751  WBC 14.0* 20.1* 13.2* 10.9* 8.1 9.8  NEUTROABS 11.3*  --   --   --   --   --   HGB 12.8* 15.2 14.2 13.9 13.8 13.9  HCT 37.6* 43.8 41.8 39.0 38.4* 39.8  MCV 90.2 86.9 85.7 86.9 87.3 85.8  PLT 184 221 204 185 201 253    Basic Metabolic Panel: Recent Labs  Lab 01/11/21 0421 01/12/21 0415 01/13/21 0547 01/14/21 0751 01/14/21 2211 01/15/21 0422  NA 134* 135 133* 133* 132* 134*  K 3.6 2.7* 3.3* 3.7 3.9 3.8  CL 90* 92* 94* 98 96* 100  CO2 31 32 30 29 31  29   GLUCOSE 151* 150* 173* 162* 232* 168*  BUN 19 16 16 15 17 14   CREATININE 1.11 1.08 1.04 0.91 1.12 0.93  CALCIUM 9.3 9.0 8.8* 8.8* 8.7* 8.6*  MG 1.9  --  2.2  --   --   --   PHOS 2.7  --   --   --   --   --     Liver Function Tests: Recent Labs  Lab 01/09/21 1441  AST 22  ALT 42  ALKPHOS 81  BILITOT 1.0  PROT 8.5*  ALBUMIN 4.1    No results for input(s): LIPASE, AMYLASE in the last 168 hours. No results for input(s): AMMONIA in the last 168 hours. Cardiac  Enzymes: No results for input(s): CKTOTAL, CKMB, CKMBINDEX, TROPONINI in the last 168 hours. BNP (last 3 results) No results for input(s): BNP in the last 8760 hours.  ProBNP (last 3 results) No results for input(s): PROBNP in the last 8760 hours.  CBG: Recent Labs  Lab 01/14/21 0717 01/14/21 1124 01/14/21 1541 01/14/21 2020 01/15/21 0819  GLUCAP 207* 191* 243* 210* 166*    Recent Results (from the past 240 hour(s))  Urine Culture     Status: Abnormal   Collection Time: 01/09/21  1:39 PM   Specimen: Urine, Random  Result Value Ref Range Status   Specimen Description   Final    URINE, RANDOM Performed at [facility], [address], [place], [place] [address]    Special Requests   Final    NONE Performed at [facility], [address] [place], [place] [address]    Culture >=100,000 COLONIES/mL PROTEUS MIRABILIS (A)  Final   Report Status 01/11/2021 FINAL  Final   Organism ID, Bacteria PROTEUS MIRABILIS (A)  Final      Susceptibility   Proteus mirabilis - MIC*    AMPICILLIN <=2 SENSITIVE Sensitive     CEFAZOLIN <=4 SENSITIVE Sensitive     CEFEPIME <=0.12 SENSITIVE Sensitive     CEFTRIAXONE <=0.25 SENSITIVE Sensitive     CIPROFLOXACIN <=0.25 SENSITIVE Sensitive     GENTAMICIN <=1 SENSITIVE Sensitive     IMIPENEM 2 SENSITIVE Sensitive     NITROFURANTOIN RESISTANT Resistant     TRIMETH/SULFA <=20 SENSITIVE Sensitive     AMPICILLIN/SULBACTAM <=2 SENSITIVE Sensitive      PIP/TAZO <=4 SENSITIVE Sensitive     * >=100,000 COLONIES/mL PROTEUS MIRABILIS  Resp Panel by RT-PCR (Flu A&B, Covid) Nasopharyngeal Swab     Status: None   Collection Time: 01/09/21  3:27 PM   Specimen: Nasopharyngeal Swab; Nasopharyngeal(NP) swabs in vial transport medium  Result Value Ref Range Status   SARS Coronavirus 2 by RT PCR NEGATIVE NEGATIVE Final    Comment: (NOTE) SARS-CoV-2 target nucleic acids are NOT DETECTED.  The SARS-CoV-2 RNA is generally detectable in upper respiratory specimens during the acute phase of infection. The lowest concentration of SARS-CoV-2 viral copies this assay can detect is 138 copies/mL. A negative result does not preclude SARS-Cov-2 infection and should not be used as the sole basis for treatment or other patient management decisions. A negative result may occur with  improper specimen collection/handling, submission of specimen other than nasopharyngeal swab, presence of viral mutation(s) within the areas targeted by this assay, and inadequate number of viral copies(<138 copies/mL). A negative result must be combined with clinical observations, patient history, and epidemiological information. The expected result is Negative.  Fact Sheet for Patients:  BloggerCourse.com  Fact Sheet for Healthcare Providers:  SeriousBroker.it  This test is no t yet approved or cleared by the [place] FDA and  has been authorized for detection and/or diagnosis of SARS-CoV-2 by FDA under an Emergency Use Authorization (EUA). This EUA will remain  in effect (meaning this test can be used) for the duration of the COVID-19 declaration under Section 564(b)(1) of the Act, 21 U.S.C.section 360bbb-3(b)(1), unless the authorization is terminated  or revoked sooner.       Influenza A by PCR NEGATIVE NEGATIVE Final   Influenza B by PCR NEGATIVE NEGATIVE Final    Comment: (NOTE) The Xpert Xpress  SARS-CoV-2/FLU/RSV plus assay is intended as an aid in the diagnosis of influenza from Nasopharyngeal swab specimens and should not  be used as a sole basis for treatment. Nasal washings and aspirates are unacceptable for Xpert Xpress SARS-CoV-2/FLU/RSV testing.  Fact Sheet for Patients: BloggerCourse.com  Fact Sheet for Healthcare Providers: SeriousBroker.it  This test is not yet approved or cleared by the [place] FDA and has been authorized for detection and/or diagnosis of SARS-CoV-2 by FDA under an Emergency Use Authorization (EUA). This EUA will remain in effect (meaning this test can be used) for the duration of the COVID-19 declaration under Section 564(b)(1) of the Act, 21 U.S.C. section 360bbb-3(b)(1), unless the authorization is terminated or revoked.  Performed at [facility], [address] [place], [place] [address]   Blood culture (single)     Status: None   Collection Time: 01/09/21  6:36 PM   Specimen: BLOOD  Result Value Ref Range Status   Specimen Description BLOOD LEFT ANTECUBITAL  Final   Special Requests   Final    BOTTLES DRAWN AEROBIC AND ANAEROBIC Blood Culture adequate volume   Culture   Final    NO GROWTH 5 DAYS Performed at [facility], [address] [place], [place] [address]    Report Status 01/14/2021 FINAL  Final      Studies: No results found.    Joycelyn Das, MD  [facility] 01/15/2021  If 7PM-7AM, please contact night-coverage

## 2021-01-16 DIAGNOSIS — I10 Essential (primary) hypertension: Secondary | ICD-10-CM | POA: Diagnosis not present

## 2021-01-16 DIAGNOSIS — R319 Hematuria, unspecified: Secondary | ICD-10-CM | POA: Diagnosis not present

## 2021-01-16 DIAGNOSIS — N390 Urinary tract infection, site not specified: Secondary | ICD-10-CM | POA: Diagnosis not present

## 2021-01-16 DIAGNOSIS — A419 Sepsis, unspecified organism: Secondary | ICD-10-CM | POA: Diagnosis not present

## 2021-01-16 LAB — GLUCOSE, CAPILLARY
Glucose-Capillary: 206 mg/dL — ABNORMAL HIGH (ref 70–99)
Glucose-Capillary: 193 mg/dL — ABNORMAL HIGH (ref 70–99)
Glucose-Capillary: 204 mg/dL — ABNORMAL HIGH (ref 70–99)
Glucose-Capillary: 172 mg/dL — ABNORMAL HIGH (ref 70–99)
Glucose-Capillary: 150 mg/dL — ABNORMAL HIGH (ref 70–99)
Glucose-Capillary: 221 mg/dL — ABNORMAL HIGH (ref 70–99)

## 2021-01-16 MED ORDER — LIDOCAINE VISCOUS HCL 2 % MT SOLN
15.0000 mL | Freq: Once | OROMUCOSAL | Status: AC
  Administered 2021-01-16: 11:00:00 15 mL via ORAL
  Filled 2021-01-16: qty 15

## 2021-01-16 MED ORDER — ALUM & MAG HYDROXIDE-SIMETH 200-200-20 MG/5ML PO SUSP
30.0000 mL | Freq: Once | ORAL | Status: AC
  Administered 2021-01-16: 09:00:00 30 mL via ORAL
  Filled 2021-01-16: qty 30

## 2021-01-16 MED ORDER — CHLORTHALIDONE 25 MG PO TABS
25.0000 mg | ORAL_TABLET | Freq: Every day | ORAL | Status: DC
  Administered 2021-01-16 – 2021-01-19 (×4): 25 mg via ORAL
  Filled 2021-01-16 (×4): qty 1

## 2021-01-16 NOTE — Progress Notes (Signed)
Patient c/o tightness in chest, described by patient as feeling like someone has his hand pressing on his chest.  Patient denies chest pain, SOB, nor DOB.  Asked patient to cough, denies chest pain on coughing.  No other complaints.  No distress.  Nitro SL given as ordered.  Dr. Tyson Babinski made aware, no new orders.   01/16/21 1236  Vitals  Temp 97.9 F (36.6 C)  BP (!) 134/92  MAP (mmHg) 104  BP Location Left Arm  BP Method Automatic  Patient Position (if appropriate) Lying  Pulse Rate 69  Resp 17  MEWS COLOR  MEWS Score Color Green  Oxygen Therapy  SpO2 96 %  O2 Device Room Air  MEWS Score  MEWS Temp 0  MEWS Systolic 0  MEWS Pulse 0  MEWS RR 0  MEWS LOC 0  MEWS Score 0

## 2021-01-16 NOTE — Progress Notes (Signed)
PROGRESS NOTE  [name] [name] BHA:[id number] DOB: 05-Nov-1970 DOA: 01/09/2021 PCP: [facility]   LOS: 7 days   Brief narrative:  [name] [name] is a 50 y.o. male with past medical history of ST elevation MI, CVA, hypertension, diabetes mellitus on insulin, gastroparesis, anxiety depression presented to hospital with dysuria, hematuria without obvious fever but had some chills.  In the ED, patient had mild grade fever at 99.2 F with tachycardia, tachypnea.  Creatinine was elevated 1.4.  Glucose at 320.  Lactic acid was elevated.  WBC was elevated as well.  Urinalysis showed white cells more than 50.  CT abdomen showed bilateral punctuate nonobstructive obstructive renal calculi .  Patient was given azactam and was admitted hospital for sepsis secondary to UTI.  During hospitalization, patient was noted to have Proteus UTI.  He however continued to have fever so infectious disease was consulted.  Patient was reevaluated by physical therapy who has recommended skilled nursing facility on discharge.  Assessment/Plan:  Principal Problem:   Sepsis (HCC) Active Problems:   Essential hypertension, benign   Type II diabetes mellitus with neurological manifestations, uncontrolled (HCC)   Acute lower UTI   Hematuria  Sepsis secondary to Proteus UTI with hematuria/complex UTI:   Currently on Levaquin.  Initially was on azactam.  Infectious disease on board.  Plan is to continue Levaquin until 01/21/2021.  Chest pain, atypical.  Likely secondary to GERD..  EKG unchanged.  Troponins negative.  Nitroglycerin has been added.  We will also add MiraLAX.  He was advised to lie down at 30 degree angle..  Significant hypokalemia.  Improved after replacement.  Check levels in a.m.  Nephrolithiasis with renal cyst..   CT shows nonobstructive small renal stone and renal cyst.  Urology had seen the scan and recommended no need for any surgical intervention   Essential hypertension:  Continue  carvedilol, hydralazine, amlodipine.   Will resume chlorthalidone due to elevated blood pressure.   Diabetes mellitus type II with hyperglycemia.  Continue sliding-scale insulin, long-acting insulin, Patient is on Victoza and metformin at home.  Diabetic coordinator on board and insulin dose adjusted.  Latest POC glucose of 244  Chronic medical conditions include history significant of STEMI, CVA, hypertension, diabetes on insulin, Gastroparesis,  anxiety/depression.  Continue aspirin and Plavix.  History of stroke with wheelchair-bound status.    Continue Lipitor, aspirin and Plavix.  Physical therapy recommending skilled nursing facility at this time  DVT prophylaxis: enoxaparin (LOVENOX) injection 40 mg Start: 01/15/21 2200 SCDs Start: 01/09/21 2017   Code Status: Full code  Family Communication: None  Status is: Inpatient  Remains inpatient appropriate because:IV treatments appropriate due to intensity of illness or inability to take PO and Inpatient level of care appropriate due to severity of illness  Dispo: The patient is from: Home              Anticipated d/c is to: Skilled nursing facility as per PT recommendation.              Patient currently is medically stable to d/c.   Difficult to place patient No  Consultants: Infectious disease  Procedures: None   Anti-infectives:  Levaquin   Anti-infectives (From admission, onward)    Start     Dose/Rate Route Frequency Ordered Stop   01/15/21 0600  levofloxacin (LEVAQUIN) tablet 750 mg        750 mg Oral Daily 01/14/21 1410     01/12/21 0600  levofloxacin (LEVAQUIN) IVPB 750 mg  Status:  Discontinued        750 mg 100 mL/hr over 90 Minutes Intravenous Every 24 hours 01/11/21 1650 01/14/21 1410   01/10/21 2200  aztreonam (AZACTAM) 2 g in sodium chloride 0.9 % 100 mL IVPB        2 g 200 mL/hr over 30 Minutes Intravenous Every 8 hours 01/10/21 2111 01/11/21 2136   01/10/21 0600  aztreonam (AZACTAM) 2 g in sodium  chloride 0.9 % 100 mL IVPB  Status:  Discontinued        2 g 200 mL/hr over 30 Minutes Intravenous Every 12 hours 01/09/21 2026 01/10/21 2111   01/09/21 1830  aztreonam (AZACTAM) 2 g in sodium chloride 0.9 % 100 mL IVPB        2 g 200 mL/hr over 30 Minutes Intravenous  Once 01/09/21 1820 01/10/21 0027      Subjective: Today, patient complains of epigastric discomfort like gas pain.  Denies any nausea vomiting.  Has had a bowel movement.  Denies any shortness of breath.  Objective: Vitals:   01/16/21 0537 01/16/21 0840  BP: (!) 136/94 (!) 148/102  Pulse: 70 73  Resp: 16 17  Temp: 98.7 F (37.1 C) 98.9 F (37.2 C)  SpO2: 96% 96%    Intake/Output Summary (Last 24 hours) at 01/16/2021 1017 Last data filed at 01/16/2021 0800 Gross per 24 hour  Intake 780 ml  Output 825 ml  Net -45 ml    Filed Weights   01/09/21 1337  Weight: 81.6 kg   Body mass index is 26.58 kg/m.   Physical Exam: General:  Average built, not in obvious distress HENT:   No scleral pallor or icterus noted. Oral mucosa is moist.  Chest: .  Diminished breath sounds bilaterally.  CVS: S1 &S2 heard. No murmur.  Regular rate and rhythm. Abdomen: Soft, mild epigastric tenderness, nondistended.  Bowel sounds are heard.   Extremities: No cyanosis, clubbing or edema.  Peripheral pulses are palpable. Psych: Alert, awake and oriented, normal mood CNS:  No cranial nerve deficits.  Moving all extremities Skin: Warm and dry.  No rashes noted.   Data Review: I have personally reviewed the following laboratory data and studies,  CBC: Recent Labs  Lab 01/09/21 1340 01/10/21 0450 01/11/21 0421 01/12/21 0415 01/13/21 0547 01/14/21 0751  WBC 14.0* 20.1* 13.2* 10.9* 8.1 9.8  NEUTROABS 11.3*  --   --   --   --   --   HGB 12.8* 15.2 14.2 13.9 13.8 13.9  HCT 37.6* 43.8 41.8 39.0 38.4* 39.8  MCV 90.2 86.9 85.7 86.9 87.3 85.8  PLT 184 221 204 185 201 253    Basic Metabolic Panel: Recent Labs  Lab 01/11/21 0421  01/12/21 0415 01/13/21 0547 01/14/21 0751 01/14/21 2211 01/15/21 0422  NA 134* 135 133* 133* 132* 134*  K 3.6 2.7* 3.3* 3.7 3.9 3.8  CL 90* 92* 94* 98 96* 100  CO2 31 32 30 29 31 29   GLUCOSE 151* 150* 173* 162* 232* 168*  BUN 19 16 16 15 17 14   CREATININE 1.11 1.08 1.04 0.91 1.12 0.93  CALCIUM 9.3 9.0 8.8* 8.8* 8.7* 8.6*  MG 1.9  --  2.2  --   --   --   PHOS 2.7  --   --   --   --   --     Liver Function Tests: Recent Labs  Lab 01/09/21 1441  AST 22  ALT 42  ALKPHOS 81  BILITOT 1.0  PROT  8.5*  ALBUMIN 4.1    No results for input(s): LIPASE, AMYLASE in the last 168 hours. No results for input(s): AMMONIA in the last 168 hours. Cardiac Enzymes: No results for input(s): CKTOTAL, CKMB, CKMBINDEX, TROPONINI in the last 168 hours. BNP (last 3 results) No results for input(s): BNP in the last 8760 hours.  ProBNP (last 3 results) No results for input(s): PROBNP in the last 8760 hours.  CBG: Recent Labs  Lab 01/15/21 1619 01/15/21 2009 01/16/21 0121 01/16/21 0539 01/16/21 0839  GLUCAP 265* 237* 206* 193* 204*    Recent Results (from the past 240 hour(s))  Urine Culture     Status: Abnormal   Collection Time: 01/09/21  1:39 PM   Specimen: Urine, Random  Result Value Ref Range Status   Specimen Description   Final    URINE, RANDOM Performed at [facility], [address] [place], [place] [address]    Special Requests   Final    NONE Performed at [facility], [address] [place], [place] [address]    Culture >=100,000 COLONIES/mL PROTEUS MIRABILIS (A)  Final   Report Status 01/11/2021 FINAL  Final   Organism ID, Bacteria PROTEUS MIRABILIS (A)  Final      Susceptibility   Proteus mirabilis - MIC*    AMPICILLIN <=2 SENSITIVE Sensitive     CEFAZOLIN <=4 SENSITIVE Sensitive     CEFEPIME <=0.12 SENSITIVE Sensitive     CEFTRIAXONE <=0.25 SENSITIVE Sensitive     CIPROFLOXACIN <=0.25 SENSITIVE Sensitive     GENTAMICIN <=1 SENSITIVE  Sensitive     IMIPENEM 2 SENSITIVE Sensitive     NITROFURANTOIN RESISTANT Resistant     TRIMETH/SULFA <=20 SENSITIVE Sensitive     AMPICILLIN/SULBACTAM <=2 SENSITIVE Sensitive     PIP/TAZO <=4 SENSITIVE Sensitive     * >=100,000 COLONIES/mL PROTEUS MIRABILIS  Resp Panel by RT-PCR (Flu A&B, Covid) Nasopharyngeal Swab     Status: None   Collection Time: 01/09/21  3:27 PM   Specimen: Nasopharyngeal Swab; Nasopharyngeal(NP) swabs in vial transport medium  Result Value Ref Range Status   SARS Coronavirus 2 by RT PCR NEGATIVE NEGATIVE Final    Comment: (NOTE) SARS-CoV-2 target nucleic acids are NOT DETECTED.  The SARS-CoV-2 RNA is generally detectable in upper respiratory specimens during the acute phase of infection. The lowest concentration of SARS-CoV-2 viral copies this assay can detect is 138 copies/mL. A negative result does not preclude SARS-Cov-2 infection and should not be used as the sole basis for treatment or other patient management decisions. A negative result may occur with  improper specimen collection/handling, submission of specimen other than nasopharyngeal swab, presence of viral mutation(s) within the areas targeted by this assay, and inadequate number of viral copies(<138 copies/mL). A negative result must be combined with clinical observations, patient history, and epidemiological information. The expected result is Negative.  Fact Sheet for Patients:  BloggerCourse.com  Fact Sheet for Healthcare Providers:  SeriousBroker.it  This test is no t yet approved or cleared by the [place] FDA and  has been authorized for detection and/or diagnosis of SARS-CoV-2 by FDA under an Emergency Use Authorization (EUA). This EUA will remain  in effect (meaning this test can be used) for the duration of the COVID-19 declaration under Section 564(b)(1) of the Act, 21 U.S.C.section 360bbb-3(b)(1), unless the authorization is  terminated  or revoked sooner.       Influenza A by PCR NEGATIVE NEGATIVE Final   Influenza B by PCR NEGATIVE NEGATIVE  Final    Comment: (NOTE) The Xpert Xpress SARS-CoV-2/FLU/RSV plus assay is intended as an aid in the diagnosis of influenza from Nasopharyngeal swab specimens and should not be used as a sole basis for treatment. Nasal washings and aspirates are unacceptable for Xpert Xpress SARS-CoV-2/FLU/RSV testing.  Fact Sheet for Patients: BloggerCourse.com  Fact Sheet for Healthcare Providers: SeriousBroker.it  This test is not yet approved or cleared by the [place] FDA and has been authorized for detection and/or diagnosis of SARS-CoV-2 by FDA under an Emergency Use Authorization (EUA). This EUA will remain in effect (meaning this test can be used) for the duration of the COVID-19 declaration under Section 564(b)(1) of the Act, 21 U.S.C. section 360bbb-3(b)(1), unless the authorization is terminated or revoked.  Performed at [facility], [address] [place], [place] [address]   Blood culture (single)     Status: None   Collection Time: 01/09/21  6:36 PM   Specimen: BLOOD  Result Value Ref Range Status   Specimen Description BLOOD LEFT ANTECUBITAL  Final   Special Requests   Final    BOTTLES DRAWN AEROBIC AND ANAEROBIC Blood Culture adequate volume   Culture   Final    NO GROWTH 5 DAYS Performed at [facility], [address] [place], [place] [address]    Report Status 01/14/2021 FINAL  Final      Studies: No results found.    Joycelyn Das, MD  [facility] 01/16/2021  If 7PM-7AM, please contact night-coverage

## 2021-01-16 NOTE — Progress Notes (Signed)
Followed up on patient chest pressure.  Patient stated relief after prn Nitro, no complaints offered at this time.  Needs addressed.

## 2021-01-17 DIAGNOSIS — I10 Essential (primary) hypertension: Secondary | ICD-10-CM

## 2021-01-17 DIAGNOSIS — E876 Hypokalemia: Secondary | ICD-10-CM

## 2021-01-17 DIAGNOSIS — N390 Urinary tract infection, site not specified: Secondary | ICD-10-CM | POA: Diagnosis not present

## 2021-01-17 DIAGNOSIS — E1165 Type 2 diabetes mellitus with hyperglycemia: Secondary | ICD-10-CM

## 2021-01-17 DIAGNOSIS — A419 Sepsis, unspecified organism: Secondary | ICD-10-CM

## 2021-01-17 DIAGNOSIS — B964 Proteus (mirabilis) (morganii) as the cause of diseases classified elsewhere: Secondary | ICD-10-CM

## 2021-01-17 DIAGNOSIS — R319 Hematuria, unspecified: Secondary | ICD-10-CM | POA: Diagnosis not present

## 2021-01-17 DIAGNOSIS — E1149 Type 2 diabetes mellitus with other diabetic neurological complication: Secondary | ICD-10-CM

## 2021-01-17 LAB — BASIC METABOLIC PANEL
Anion gap: 14 (ref 5–15)
BUN: 14 mg/dL (ref 6–20)
CO2: 24 mmol/L (ref 22–32)
Calcium: 9.2 mg/dL (ref 8.9–10.3)
Chloride: 97 mmol/L — ABNORMAL LOW (ref 98–111)
Creatinine, Ser: 0.91 mg/dL (ref 0.61–1.24)
GFR, Estimated: >60 mL/min (ref >60)
Glucose, Bld: 169 mg/dL — ABNORMAL HIGH (ref 70–99)
Potassium: 3.6 mmol/L (ref 3.5–5.1)
Sodium: 135 mmol/L (ref 135–145)

## 2021-01-17 LAB — GLUCOSE, CAPILLARY
Glucose-Capillary: 188 mg/dL — ABNORMAL HIGH (ref 70–99)
Glucose-Capillary: 307 mg/dL — ABNORMAL HIGH (ref 70–99)
Glucose-Capillary: 138 mg/dL — ABNORMAL HIGH (ref 70–99)
Glucose-Capillary: 81 mg/dL (ref 70–99)

## 2021-01-17 MED ORDER — INSULIN ASPART 100 UNIT/ML IJ SOLN
9.0000 [IU] | Freq: Three times a day (TID) | INTRAMUSCULAR | Status: DC
  Administered 2021-01-17 – 2021-01-19 (×6): 9 [IU] via SUBCUTANEOUS
  Filled 2021-01-17 (×6): qty 1

## 2021-01-17 MED ORDER — SIMETHICONE 80 MG PO CHEW: 160.0000 mg | CHEWABLE_TABLET | Freq: Four times a day (QID) | ORAL | 0 refills | Status: AC | PRN

## 2021-01-17 MED ORDER — ISOSORBIDE MONONITRATE ER 30 MG PO TB24: 30.0000 mg | ORAL_TABLET | Freq: Every day | ORAL | 2 refills | Status: AC

## 2021-01-17 MED ORDER — ISOSORBIDE MONONITRATE ER 30 MG PO TB24
30.0000 mg | ORAL_TABLET | Freq: Every day | ORAL | Status: DC
  Administered 2021-01-17 – 2021-01-19 (×3): 30 mg via ORAL
  Filled 2021-01-17 (×3): qty 1

## 2021-01-17 MED ORDER — LEVOFLOXACIN 750 MG PO TABS: 750.0000 mg | ORAL_TABLET | Freq: Every day | ORAL | 0 refills | Status: AC

## 2021-01-17 MED ORDER — ALUM & MAG HYDROXIDE-SIMETH 200-200-20 MG/5ML PO SUSP: 30.0000 mL | Freq: Four times a day (QID) | ORAL | 0 refills | Status: AC | PRN

## 2021-01-17 MED ORDER — DICYCLOMINE HCL 10 MG PO CAPS: 10.0000 mg | ORAL_CAPSULE | Freq: Three times a day (TID) | ORAL | 0 refills | Status: DC

## 2021-01-17 NOTE — Plan of Care (Signed)
We were unable to reach [facility] to transport patient to hotel.  We also reached out to [phone] and LM w/no cal back.   We were very concerned about pt leaving w/out immediate access to medications.  We had not arranged for a short term supply of essential meds to cover him. He receives his medications from the [place].   Pt is wheelchair bound and can't get to a drug store easily. We gave him a sheet for medication management w/participating drug stores but he reports not to have sufficient funds for copay and to pay for hotel. He is on disability and gets paid the 2nd Wed of the month.    I reached out to [employer] who is on the board of [facility] and they will try to assist pt w/finding permanent, safe housing.

## 2021-01-17 NOTE — Progress Notes (Signed)
Inpatient Diabetes Program Recommendations  AACE/ADA: New Consensus Statement on Inpatient Glycemic Control (2015)  Target Ranges:  Prepandial:   less than 140 mg/dL      Peak postprandial:   less than 180 mg/dL (1-2 hours)      Critically ill patients:  140 - 180 mg/dL   Results for [name] [name], [name] [name] (MRN [id number]) as of 01/17/2021 13:27  Ref. Range 01/17/2021 07:27 01/17/2021 12:15  Glucose-Capillary Latest Ref Range: 70 - 99 mg/dL 801 (H)  10 units Novolog  307 (H)  18 units Novolog     Home DM Meds: Lantus 40 units QHS     Novolog 10 units TID with meals     Jardiance 1/2 pill daily  Current Orders: Semglee 25 units QHS Novolog 0-15 units TID ac/hs  Novolog 7 units TID with meals     MD- Note CBG 307 at 12pm today.    Please consider increasing the Novolog Meal Coverage to 9 units TID with meals    --Will follow patient during hospitalization--  Ambrose Finland RN, MSN, CDE Diabetes Coordinator Inpatient Glycemic Control Team Team Pager: [phone] (8a-5p)

## 2021-01-17 NOTE — Discharge Summary (Signed)
Physician Discharge Summary  BRONCO STEM OVZ:[id number] DOB: 10-03-1970 DOA: 01/09/2021  PCP: [facility]  Admit date: 01/09/2021 Discharge date: 01/17/2021  Admitted From: Home  Discharge disposition: Home  Recommendations for Outpatient Follow-Up:   Follow up with your primary care provider in one week.  Check CBC, BMP, magnesium in the next visit  Discharge Diagnosis:   Principal Problem:   Sepsis (HCC) Active Problems:   Essential hypertension, benign   Type II diabetes mellitus with neurological manifestations, uncontrolled (HCC)   Acute lower UTI   Hematuria   Discharge Condition: Improved.  Diet recommendation: Low sodium, heart healthy.  Carbohydrate-modified.    Wound care: None.  Code status: Full.   History of Present Illness:   [name] [name] is a 50 y.o. male with past medical history of ST elevation MI, CVA, hypertension, diabetes mellitus on insulin, gastroparesis, anxiety depression presented to hospital with dysuria, hematuria without obvious fever but had some chills.  In the ED, patient had mild grade fever at 99.2 F with tachycardia, tachypnea.  Creatinine was elevated 1.4.  Glucose at 320.  Lactic acid was elevated.  WBC was elevated as well.  Urinalysis showed white cells more than 50.  CT abdomen showed bilateral punctuate nonobstructive obstructive renal calculi .  Patient was given azactam and was admitted hospital for sepsis secondary to UTI.   Hospital Course:   Following conditions were addressed during hospitalization as listed below,  Sepsis secondary to Proteus UTI with hematuria/complex UTI:   Currently on Levaquin.  Initially was on azactam.  Infectious disease recommend to continue Levaquin until 01/21/2021.  Patient has improved at this time.   Chest pain, atypical.  Likely secondary to GERD..  EKG unchanged.  Troponins negative.  GERD precautions has been advised.  Feels much better today.  Long-acting nitro has been  added to the regimen to previous history of coronary artery disease.  Patient was advised to elevate the head of the bed.   Hypokalemia.  Improved after replacement.    Nephrolithiasis with renal cyst..   CT shows nonobstructive small renal stone and renal cyst.  Urology had seen the scan and recommended no need for any surgical intervention   Essential hypertension:  Continue carvedilol, hydralazine, amlodipine and chlorthalidone Patient was not taking atenolol and Dyazide at home.   Diabetes mellitus type II with hyperglycemia.  Improving.  Continue sliding-scale insulin, long-acting insulin on discharge.  Patient on Jardiance as well.  Patient was not taking Victoza and metformin at home.  Diabetic coordinator on board and insulin dose adjusted.   Chronic medical conditions include history significant of STEMI, CVA, hypertension, diabetes on insulin, Gastroparesis,  anxiety/depression.  Continue aspirin and Plavix.   History of stroke with ambulatory dysfunction..    Continue Lipitor, aspirin and Plavix.  At this time patient has been considered for disposition to shelter.  Disposition.  At this time, patient is stable for disposition home with outpatient PCP follow-up.  Medical Consultants:   Infectious disease  Procedures:    None Subjective:   Today, patient was seen and examined at bedside.  Patient denies any chest pain, heartburn, nausea vomiting or shortness of breath.    Discharge Exam:   Vitals:   01/17/21 0725 01/17/21 1218  BP: (!) 127/96 117/88  Pulse: 75 68  Resp: 16 16  Temp: 98.2 F (36.8 C) 98.1 F (36.7 C)  SpO2: 94% 95%   Vitals:   01/17/21 0033 01/17/21 0423 01/17/21 0725 01/17/21 1218  BP: (!) 123/91 (!) 140/95 (!) 127/96 117/88  Pulse: 75 67 75 68  Resp: 20 18 16 16   Temp: 98.3 F (36.8 C) 98.5 F (36.9 C) 98.2 F (36.8 C) 98.1 F (36.7 C)  TempSrc: Oral Oral    SpO2: 95% 95% 94% 95%  Weight:      Height:        General: Alert awake,  not in obvious distress, average built HENT: pupils equally reacting to light,  No scleral pallor or icterus noted. Oral mucosa is moist.  Chest:  Clear breath sounds.  Diminished breath sounds bilaterally. No crackles or wheezes.  CVS: S1 &S2 heard. No murmur.  Regular rate and rhythm. Abdomen: Soft, nontender, nondistended.  Bowel sounds are heard.   Extremities: No cyanosis, clubbing or edema.  Peripheral pulses are palpable. Psych: Alert, awake and oriented, normal mood CNS:  No cranial nerve deficits.  Moving all extremities. Skin: Warm and dry.  No rashes noted.  The results of significant diagnostics from this hospitalization (including imaging, microbiology, ancillary and laboratory) are listed below for reference.     Diagnostic Studies:   DG Chest 1 View  Result Date: 01/09/2021 CLINICAL DATA:  Tachycardia.  Lactic elevated.  Hematuria. EXAM: CHEST  1 VIEW COMPARISON:  06/06/2020 FINDINGS: Shallow inspiration. Heart size and pulmonary vascularity are normal. Minimal linear atelectasis suggested in the left base. No focal consolidation or airspace disease. No pleural effusions. No pneumothorax. Mediastinal contours appear intact. A loop recorder is present. IMPRESSION: Minimal linear atelectasis in the left base. No focal consolidation. Electronically Signed   By: 06/08/2020 M.D.   On: 01/09/2021 19:03   CT Renal Stone Study  Result Date: 01/09/2021 CLINICAL DATA:  Hematuria. The patient takes Plavix for prior stroke. EXAM: CT ABDOMEN AND PELVIS WITHOUT CONTRAST TECHNIQUE: Multidetector CT imaging of the abdomen and pelvis was performed following the standard protocol without IV contrast. COMPARISON:  01/02/2021 CT abdomen/pelvis with contrast from [facility]. FINDINGS: Lower chest: The bottom of a loop recorder is visible on the scout image. The lung bases appear clear. Hepatobiliary: Diffuse hepatic steatosis. Minimal sparing along the gallbladder fossa. Calcification  adjacent to a small hypodense subcapsular lesion posteriorly in the right hepatic lobe on image 18 series 2 unchanged from prior. Pancreas: Unremarkable Spleen: Unremarkable Adrenals/Urinary Tract: Both adrenal glands appear normal. Stable 3 mm right mid to lower kidney nonobstructive renal calculus on image 81 series 5. Suspected 1-2 mm nonobstructive left mid kidney calculus. Complex left renal cystic lesion with a higher density marginal element, bilobed appearance with a septation, and some calcification along the inferior margin of the dominant exophytic portion. Today's exam is performed without IV contrast and a Bosniak classification cannot be assigned. Stomach/Bowel: Prominent stool throughout the colon favors constipation. Wall thickening in the lower rectum on image 88 of series 2 was not present 1 week ago and accordingly is probably peristaltic or incidental. Appendix surgically absent. Vascular/Lymphatic: Generally mild atherosclerotic calcifications are observed. No pathologic adenopathy. Reproductive: Unremarkable Other: No supplemental non-categorized findings. 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 IMPRESSION: 1. Single punctate bilateral nonobstructive renal calculi. 2. Complex cyst of the left mid kidney roughly similar to prior exams, noncontrast CT is not suitable for assigning a Bosniak classification. If further workup of this lesion is warranted, I would suggest renal protocol MRI with and without contrast on a elective basis. 3. Diffuse hepatic steatosis. 4.  Prominent stool throughout the colon favors  constipation. 5. Mild atherosclerotic calcifications. 6. 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 Electronically Signed   By: Gaylyn Rong M.D.   On: 01/09/2021 17:00     Labs:   Basic Metabolic Panel: Recent Labs  Lab 01/11/21 0421 01/12/21 0415 01/13/21 0547 01/14/21 0751 01/14/21 2211 01/15/21 0422  01/17/21 0417  NA 134*   < > 133* 133* 132* 134* 135  K 3.6   < > 3.3* 3.7 3.9 3.8 3.6  CL 90*   < > 94* 98 96* 100 97*  CO2 31   < > 30 29 31 29 24   GLUCOSE 151*   < > 173* 162* 232* 168* 169*  BUN 19   < > 16 15 17 14 14   CREATININE 1.11   < > 1.04 0.91 1.12 0.93 0.91  CALCIUM 9.3   < > 8.8* 8.8* 8.7* 8.6* 9.2  MG 1.9  --  2.2  --   --   --   --   PHOS 2.7  --   --   --   --   --   --    < > = values in this interval not displayed.   GFR Estimated Creatinine Clearance: 99.3 mL/min (by C-G formula based on SCr of 0.91 mg/dL). Liver Function Tests: No results for input(s): AST, ALT, ALKPHOS, BILITOT, PROT, ALBUMIN in the last 168 hours. No results for input(s): LIPASE, AMYLASE in the last 168 hours. No results for input(s): AMMONIA in the last 168 hours. Coagulation profile No results for input(s): INR, PROTIME in the last 168 hours.  CBC: Recent Labs  Lab 01/11/21 0421 01/12/21 0415 01/13/21 0547 01/14/21 0751  WBC 13.2* 10.9* 8.1 9.8  HGB 14.2 13.9 13.8 13.9  HCT 41.8 39.0 38.4* 39.8  MCV 85.7 86.9 87.3 85.8  PLT 204 185 201 253   Cardiac Enzymes: No results for input(s): CKTOTAL, CKMB, CKMBINDEX, TROPONINI in the last 168 hours. BNP: Invalid input(s): POCBNP CBG: Recent Labs  Lab 01/16/21 1235 01/16/21 1620 01/16/21 2127 01/17/21 0727 01/17/21 1215  GLUCAP 172* 150* 221* 188* 307*   D-Dimer No results for input(s): DDIMER in the last 72 hours. Hgb A1c No results for input(s): HGBA1C in the last 72 hours. Lipid Profile No results for input(s): CHOL, HDL, LDLCALC, TRIG, CHOLHDL, LDLDIRECT in the last 72 hours. Thyroid function studies No results for input(s): TSH, T4TOTAL, T3FREE, THYROIDAB in the last 72 hours.  Invalid input(s): FREET3 Anemia work up No results for input(s): VITAMINB12, FOLATE, FERRITIN, TIBC, IRON, RETICCTPCT in the last 72 hours. Microbiology Recent Results (from the past 240 hour(s))  Urine Culture     Status: Abnormal    Collection Time: 01/09/21  1:39 PM   Specimen: Urine, Random  Result Value Ref Range Status   Specimen Description   Final    URINE, RANDOM Performed at [facility], [address], [place], [address] [place]    Special Requests   Final    NONE Performed at [facility], [address] [place], [address] [place]    Culture >=100,000 COLONIES/mL PROTEUS MIRABILIS (A)  Final   Report Status 01/11/2021 FINAL  Final   Organism ID, Bacteria PROTEUS MIRABILIS (A)  Final      Susceptibility   Proteus mirabilis - MIC*    AMPICILLIN <=2 SENSITIVE Sensitive     CEFAZOLIN <=4 SENSITIVE Sensitive     CEFEPIME <=0.12 SENSITIVE Sensitive     CEFTRIAXONE <=0.25 SENSITIVE Sensitive  CIPROFLOXACIN <=0.25 SENSITIVE Sensitive     GENTAMICIN <=1 SENSITIVE Sensitive     IMIPENEM 2 SENSITIVE Sensitive     NITROFURANTOIN RESISTANT Resistant     TRIMETH/SULFA <=20 SENSITIVE Sensitive     AMPICILLIN/SULBACTAM <=2 SENSITIVE Sensitive     PIP/TAZO <=4 SENSITIVE Sensitive     * >=100,000 COLONIES/mL PROTEUS MIRABILIS  Resp Panel by RT-PCR (Flu A&B, Covid) Nasopharyngeal Swab     Status: None   Collection Time: 01/09/21  3:27 PM   Specimen: Nasopharyngeal Swab; Nasopharyngeal(NP) swabs in vial transport medium  Result Value Ref Range Status   SARS Coronavirus 2 by RT PCR NEGATIVE NEGATIVE Final    Comment: (NOTE) SARS-CoV-2 target nucleic acids are NOT DETECTED.  The SARS-CoV-2 RNA is generally detectable in upper respiratory specimens during the acute phase of infection. The lowest concentration of SARS-CoV-2 viral copies this assay can detect is 138 copies/mL. A negative result does not preclude SARS-Cov-2 infection and should not be used as the sole basis for treatment or other patient management decisions. A negative result may occur with  improper specimen collection/handling, submission of specimen other than nasopharyngeal swab, presence of viral mutation(s) within  the areas targeted by this assay, and inadequate number of viral copies(<138 copies/mL). A negative result must be combined with clinical observations, patient history, and epidemiological information. The expected result is Negative.  Fact Sheet for Patients:  BloggerCourse.com  Fact Sheet for Healthcare Providers:  SeriousBroker.it  This test is no t yet approved or cleared by the [place] FDA and  has been authorized for detection and/or diagnosis of SARS-CoV-2 by FDA under an Emergency Use Authorization (EUA). This EUA will remain  in effect (meaning this test can be used) for the duration of the COVID-19 declaration under Section 564(b)(1) of the Act, 21 U.S.C.section 360bbb-3(b)(1), unless the authorization is terminated  or revoked sooner.       Influenza A by PCR NEGATIVE NEGATIVE Final   Influenza B by PCR NEGATIVE NEGATIVE Final    Comment: (NOTE) The Xpert Xpress SARS-CoV-2/FLU/RSV plus assay is intended as an aid in the diagnosis of influenza from Nasopharyngeal swab specimens and should not be used as a sole basis for treatment. Nasal washings and aspirates are unacceptable for Xpert Xpress SARS-CoV-2/FLU/RSV testing.  Fact Sheet for Patients: BloggerCourse.com  Fact Sheet for Healthcare Providers: SeriousBroker.it  This test is not yet approved or cleared by the [place] FDA and has been authorized for detection and/or diagnosis of SARS-CoV-2 by FDA under an Emergency Use Authorization (EUA). This EUA will remain in effect (meaning this test can be used) for the duration of the COVID-19 declaration under Section 564(b)(1) of the Act, 21 U.S.C. section 360bbb-3(b)(1), unless the authorization is terminated or revoked.  Performed at [facility], [address] [place] [address] [place], [place] [address]   Blood culture (single)     Status: None    Collection Time: 01/09/21  6:36 PM   Specimen: BLOOD  Result Value Ref Range Status   Specimen Description BLOOD LEFT ANTECUBITAL  Final   Special Requests   Final    BOTTLES DRAWN AEROBIC AND ANAEROBIC Blood Culture adequate volume   Culture   Final    NO GROWTH 5 DAYS Performed at [facility], [name], [place], [place] [address]    Report Status 01/14/2021 FINAL  Final     Discharge Instructions:   Discharge Instructions     Diet - low sodium heart healthy   Complete by:  As directed    Diet Carb Modified   Complete by: As directed    Discharge instructions   Complete by: As directed    Follow-up with your primary care physician in 1 week.  Complete the course of antibiotic.  Seek medical attention for worsening symptoms.   Increase activity slowly   Complete by: As directed       Allergies as of 01/17/2021       Reactions   Penicillins Other (See Comments), Anaphylaxis   Has patient had a PCN reaction causing immediate rash, facial/tongue/throat swelling, SOB or lightheadedness with hypotension: Yes Has patient had a PCN reaction causing severe rash involving mucus membranes or skin necrosis: No Has patient had a PCN reaction that required hospitalization: No Has patient had a PCN reaction occurring within the last 10 years: No If all of the above answers are "NO", then may proceed with Cephalosporin use.        Medication List     STOP taking these medications    Apixaban Starter Pack (10mg  and 5mg ) Commonly known as: ELIQUIS STARTER PACK   atenolol 50 MG tablet Commonly known as: TENORMIN   ezetimibe 10 MG tablet Commonly known as: ZETIA   liraglutide 18 MG/3ML Sopn Commonly known as: VICTOZA   metFORMIN 500 MG 24 hr tablet Commonly known as: GLUCOPHAGE-XR   ondansetron 4 MG disintegrating tablet Commonly known as: Zofran ODT   sucralfate 1 g tablet Commonly known as: Carafate   triamterene-hydrochlorothiazide 37.5-25 MG  capsule Commonly known as: DYAZIDE       TAKE these medications    alum & mag hydroxide-simeth 200-200-20 MG/5ML suspension Commonly known as: MAALOX/MYLANTA Take 30 mLs by mouth every 6 (six) hours as needed for indigestion or heartburn.   amLODipine 10 MG tablet Commonly known as: NORVASC Take 1 tablet by mouth daily. What changed: Another medication with the same name was removed. Continue taking this medication, and follow the directions you see here.   aspirin 81 MG EC tablet Take 81 mg by mouth daily.   atorvastatin 80 MG tablet Commonly known as: LIPITOR Take 80 mg by mouth daily.   buPROPion 150 MG 24 hr tablet Commonly known as: WELLBUTRIN XL Take 150 mg by mouth daily.   carvedilol 25 MG tablet Commonly known as: COREG Take 25 mg by mouth in the morning and at bedtime.   chlorthalidone 25 MG tablet Commonly known as: HYGROTON Take 25 mg by mouth daily.   citalopram 40 MG tablet Commonly known as: CELEXA Take 40 mg by mouth daily.   clopidogrel 75 MG tablet Commonly known as: PLAVIX Take 75 mg by mouth daily.   dicyclomine 10 MG capsule Commonly known as: BENTYL Take 1 capsule (10 mg total) by mouth 4 (four) times daily -  before meals and at bedtime for 5 days.   folic acid 1 MG tablet Commonly known as: FOLVITE Take 1 mg by mouth daily.   insulin aspart 100 UNIT/ML injection Commonly known as: NovoLOG Inject 5 Units into the skin 3 (three) times daily with meals. What changed: how much to take   insulin glargine 100 UNIT/ML injection Commonly known as: LANTUS Inject 40 Units into the skin at bedtime.   isosorbide mononitrate 30 MG 24 hr tablet Commonly known as: IMDUR Take 1 tablet (30 mg total) by mouth daily. Start taking on: January 18, 2021   JARDIANCE PO Take by mouth. Pt reports taking 1/2 pill per day; not sure of dose  levofloxacin 750 MG tablet Commonly known as: LEVAQUIN Take 1 tablet (750 mg total) by mouth daily for 3  days. Start taking on: January 18, 2021   losartan 100 MG tablet Commonly known as: COZAAR Take 100 mg by mouth every evening.   metoCLOPramide 10 MG tablet Commonly known as: REGLAN Take 1 tablet (10 mg total) by mouth every 12 (twelve) hours as needed for nausea.   Multivitamin Adult Tabs Take 1 tablet by mouth daily.   pantoprazole 20 MG tablet Commonly known as: PROTONIX Take 1 tablet (20 mg total) by mouth daily.   simethicone 80 MG chewable tablet Commonly known as: MYLICON Chew 2 tablets (160 mg total) by mouth 4 (four) times daily as needed for flatulence.   tamsulosin 0.4 MG Caps capsule Commonly known as: FLOMAX Take 0.4 mg by mouth daily.          Time coordinating discharge: 39 minutes  Signed:  Savayah Waltrip  [facility] 01/17/2021, 3:39 PM

## 2021-01-17 NOTE — Progress Notes (Signed)
In the process of reviewing discharge instructions with patient and he states he cannot afford the prescription meds and he has no home meds because they are at the place he was evicted from which is now locked up.  SW on call paged.  Consulting civil engineer and MD notified.

## 2021-01-17 NOTE — Progress Notes (Signed)
Physical Therapy Treatment Patient Details Name: FUE CERVENKA MRN: [id number] DOB: [birth year] Today's Date: 01/17/2021   History of Present Illness Rahil Passey is a 50yoM who comes [facility] on 9/18 c blood in urine, also melena. PMH: STEMI, CVAx2, HTN, IDDM, gastroparesis, GAD, depression. Orthostatic BP on 9/21 at OT evaluation. At baseline pt uses a WC for basic mobility, household propulsion with mostly feet and LUE due to CVA related deficits.    PT Comments    Pt resting in bed upon PT arrival; agreeable and eager to get OOB.  Pt reporting no dizziness or lightheadedness during sessions activities.  Modified independent semi-supine to sitting edge of bed; SBA with transfer bed to w/c; and SBA with w/c propulsion 80 feet, 60 feet, and then 30 feet x2 (pt requiring rest breaks between trials d/t fatigue).  Pt progressing well with functional mobility.  PT discharge recommendations updated to HHPT: TOC notified.    Recommendations for follow up therapy are one component of a multi-disciplinary discharge planning process, led by the attending physician.  Recommendations may be updated based on patient status, additional functional criteria and insurance authorization.  Follow Up Recommendations  Home health PT     Equipment Recommendations  Wheelchair (measurements PT);Wheelchair cushion (measurements PT);3in1 (PT)    Recommendations for Other Services       Precautions / Restrictions Precautions Precautions: Fall Restrictions Weight Bearing Restrictions: No     Mobility  Bed Mobility Overal bed mobility: Modified Independent Bed Mobility: Supine to Sit     Supine to sit: Modified independent (Device/Increase time);HOB elevated     General bed mobility comments: no difficulties notes    Transfers Overall transfer level: Needs assistance Equipment used: None Transfers: Stand Pivot Transfers   Stand pivot transfers: Supervision       General transfer comment: stand  pivot bed to w/c (pt set brakes prior to transfer) towards L side  Ambulation/Gait             General Gait Details: Deferred: pt non-ambulatory baseline   Dance movement psychotherapist Wheelchair mobility: Yes Wheelchair propulsion: Left upper extremity;Both lower extermities Wheelchair parts: Independent Distance: 80 feet; 60 feet; 30 feet; 30 feet  Modified Rankin (Stroke Patients Only)       Balance Overall balance assessment: Needs assistance Sitting-balance support: No upper extremity supported;Feet supported Sitting balance-Leahy Scale: Good Sitting balance - Comments: steady sitting taking off socks and putting on shoes                                    Cognition Arousal/Alertness: Awake/alert Behavior During Therapy: WFL for tasks assessed/performed Overall Cognitive Status: Within Functional Limits for tasks assessed                                        Exercises      General Comments  Nursing cleared pt for participation in physical therapy.  Pt agreeable to PT session.      Pertinent Vitals/Pain Pain Assessment: No/denies pain Pain Intervention(s): Limited activity within patient's tolerance;Monitored during session;Repositioned Vitals (HR and O2 on room air) stable and WFL throughout treatment session.    Home Living  Prior Function            PT Goals (current goals can now be found in the care plan section) Acute Rehab PT Goals Patient Stated Goal: to return to PLOF PT Goal Formulation: With patient Time For Goal Achievement: 01/27/21 Potential to Achieve Goals: Good Progress towards PT goals: Progressing toward goals    Frequency    Min 2X/week      PT Plan Discharge plan needs to be updated    Co-evaluation              AM-PAC PT "6 Clicks" Mobility   Outcome Measure  Help needed turning from your back to your side  while in a flat bed without using bedrails?: None Help needed moving from lying on your back to sitting on the side of a flat bed without using bedrails?: None Help needed moving to and from a bed to a chair (including a wheelchair)?: A Little Help needed standing up from a chair using your arms (e.g., wheelchair or bedside chair)?: A Little Help needed to walk in hospital room?: Total Help needed climbing 3-5 steps with a railing? : Total 6 Click Score: 16    End of Session Equipment Utilized During Treatment: Gait belt Activity Tolerance: Patient tolerated treatment well Patient left: Other (comment) (in personal w/c with OT present for OT session) Nurse Communication: Mobility status PT Visit Diagnosis: Unsteadiness on feet (R26.81);Other abnormalities of gait and mobility (R26.89);Muscle weakness (generalized) (M62.81)     Time: [id number] PT Time Calculation (min) (ACUTE ONLY): 28 min  Charges:  $Therapeutic Exercise: 8-22 mins $Therapeutic Activity: 8-22 mins                    Hendricks Limes, PT 01/17/21, 3:22 PM

## 2021-01-17 NOTE — Progress Notes (Signed)
PROGRESS NOTE  [name] [name] WCB:[id number] DOB: 03-Jun-1970 DOA: 01/09/2021 PCP: [facility]   LOS: 8 days   Brief narrative:  [name] [name] is a 50 y.o. male with past medical history of ST elevation MI, CVA, hypertension, diabetes mellitus on insulin, gastroparesis, anxiety depression presented to hospital with dysuria, hematuria without obvious fever but had some chills.  In the ED, patient had mild grade fever at 99.2 F with tachycardia, tachypnea.  Creatinine was elevated 1.4.  Glucose at 320.  Lactic acid was elevated.  WBC was elevated as well.  Urinalysis showed white cells more than 50.  CT abdomen showed bilateral punctuate nonobstructive obstructive renal calculi .  Patient was given azactam and was admitted hospital for sepsis secondary to UTI.  During hospitalization, patient was noted to have Proteus UTI.  He however continued to have fever so infectious disease was consulted.  Patient was reevaluated by physical therapy who has recommended skilled nursing facility on discharge.  Assessment/Plan:  Principal Problem:   Sepsis (HCC) Active Problems:   Essential hypertension, benign   Type II diabetes mellitus with neurological manifestations, uncontrolled (HCC)   Acute lower UTI   Hematuria  Sepsis secondary to Proteus UTI with hematuria/complex UTI:   Currently on Levaquin.  Initially was on azactam.  Infectious disease recommend to continue Levaquin until 01/21/2021.  Chest pain, atypical.  Likely secondary to GERD..  EKG unchanged.  Troponins negative.  GERD precautions has been advised.  Feels much better today.  Long-acting nitro has been added to the regimen.  Significant hypokalemia.  Improved after replacement.  .  Nephrolithiasis with renal cyst..   CT shows nonobstructive small renal stone and renal cyst.  Urology had seen the scan and recommended no need for any surgical intervention   Essential hypertension:  Continue carvedilol, hydralazine,  amlodipine and chlorthalidone .  Blood pressure better today after resuming chlorthalidone  Diabetes mellitus type II with hyperglycemia.  Improving.  Continue sliding-scale insulin, long-acting insulin, Patient is on Victoza and metformin at home.  Diabetic coordinator on board and insulin dose adjusted.  Latest POC glucose of  188  Chronic medical conditions include history significant of STEMI, CVA, hypertension, diabetes on insulin, Gastroparesis,  anxiety/depression.  Continue aspirin and Plavix.  History of stroke with wheelchair-bound status.    Continue Lipitor, aspirin and Plavix.  Physical therapy recommending skilled nursing facility at this time  DVT prophylaxis: enoxaparin (LOVENOX) injection 40 mg Start: 01/15/21 2200 SCDs Start: 01/09/21 2017   Code Status: Full code  Family Communication: None  Status is: Inpatient  Remains inpatient appropriate because:IV treatments appropriate due to intensity of illness or inability to take PO and Inpatient level of care appropriate due to severity of illness  Dispo: The patient is from: Home              Anticipated d/c is to: Skilled nursing facility as per PT recommendation.              Patient currently is medically stable to d/c.   Difficult to place patient No  Consultants: Infectious disease  Procedures: None   Anti-infectives:  Levaquin   Anti-infectives (From admission, onward)    Start     Dose/Rate Route Frequency Ordered Stop   01/15/21 0600  levofloxacin (LEVAQUIN) tablet 750 mg        750 mg Oral Daily 01/14/21 1410     01/12/21 0600  levofloxacin (LEVAQUIN) IVPB 750 mg  Status:  Discontinued  750 mg 100 mL/hr over 90 Minutes Intravenous Every 24 hours 01/11/21 1650 01/14/21 1410   01/10/21 2200  aztreonam (AZACTAM) 2 g in sodium chloride 0.9 % 100 mL IVPB        2 g 200 mL/hr over 30 Minutes Intravenous Every 8 hours 01/10/21 2111 01/11/21 2136   01/10/21 0600  aztreonam (AZACTAM) 2 g in sodium  chloride 0.9 % 100 mL IVPB  Status:  Discontinued        2 g 200 mL/hr over 30 Minutes Intravenous Every 12 hours 01/09/21 2026 01/10/21 2111   01/09/21 1830  aztreonam (AZACTAM) 2 g in sodium chloride 0.9 % 100 mL IVPB        2 g 200 mL/hr over 30 Minutes Intravenous  Once 01/09/21 1820 01/10/21 0027      Subjective: Today, patient was seen and examined at bedside.  Patient denies any chest pain, heartburn, nausea vomiting or shortness of breath.  Objective: Vitals:   01/17/21 0423 01/17/21 0725  BP: (!) 140/95 (!) 127/96  Pulse: 67 75  Resp: 18 16  Temp: 98.5 F (36.9 C) 98.2 F (36.8 C)  SpO2: 95% 94%    Intake/Output Summary (Last 24 hours) at 01/17/2021 1045 Last data filed at 01/17/2021 1013 Gross per 24 hour  Intake 960 ml  Output 476 ml  Net 484 ml    Filed Weights   01/09/21 1337  Weight: 81.6 kg   Body mass index is 26.58 kg/m.   Physical Exam: General:  Average built, not in obvious distress HENT:   No scleral pallor or icterus noted. Oral mucosa is moist.  Chest:  Clear breath sounds.  Diminished breath sounds bilaterally. No crackles or wheezes.  CVS: S1 &S2 heard. No murmur.  Regular rate and rhythm. Abdomen: Soft, nontender, nondistended.  Bowel sounds are heard.   Extremities: No cyanosis, clubbing or edema.  Peripheral pulses are palpable. Psych: Alert, awake and oriented, normal mood CNS:  No cranial nerve deficits.  Moving all extremities. Skin: Warm and dry.  No rashes noted.   Data Review: I have personally reviewed the following laboratory data and studies,  CBC: Recent Labs  Lab 01/11/21 0421 01/12/21 0415 01/13/21 0547 01/14/21 0751  WBC 13.2* 10.9* 8.1 9.8  HGB 14.2 13.9 13.8 13.9  HCT 41.8 39.0 38.4* 39.8  MCV 85.7 86.9 87.3 85.8  PLT 204 185 201 253    Basic Metabolic Panel: Recent Labs  Lab 01/11/21 0421 01/12/21 0415 01/13/21 0547 01/14/21 0751 01/14/21 2211 01/15/21 0422 01/17/21 0417  NA 134*   < > 133* 133*  132* 134* 135  K 3.6   < > 3.3* 3.7 3.9 3.8 3.6  CL 90*   < > 94* 98 96* 100 97*  CO2 31   < > 30 29 31 29 24   GLUCOSE 151*   < > 173* 162* 232* 168* 169*  BUN 19   < > 16 15 17 14 14   CREATININE 1.11   < > 1.04 0.91 1.12 0.93 0.91  CALCIUM 9.3   < > 8.8* 8.8* 8.7* 8.6* 9.2  MG 1.9  --  2.2  --   --   --   --   PHOS 2.7  --   --   --   --   --   --    < > = values in this interval not displayed.    Liver Function Tests: No results for input(s): AST, ALT, ALKPHOS, BILITOT, PROT, ALBUMIN in  the last 168 hours.  No results for input(s): LIPASE, AMYLASE in the last 168 hours. No results for input(s): AMMONIA in the last 168 hours. Cardiac Enzymes: No results for input(s): CKTOTAL, CKMB, CKMBINDEX, TROPONINI in the last 168 hours. BNP (last 3 results) No results for input(s): BNP in the last 8760 hours.  ProBNP (last 3 results) No results for input(s): PROBNP in the last 8760 hours.  CBG: Recent Labs  Lab 01/16/21 0839 01/16/21 1235 01/16/21 1620 01/16/21 2127 01/17/21 0727  GLUCAP 204* 172* 150* 221* 188*    Recent Results (from the past 240 hour(s))  Urine Culture     Status: Abnormal   Collection Time: 01/09/21  1:39 PM   Specimen: Urine, Random  Result Value Ref Range Status   Specimen Description   Final    URINE, RANDOM Performed at [facility], [address], [place], [place] [address]    Special Requests   Final    NONE Performed at [facility], [address] [place], [place] [address]    Culture >=100,000 COLONIES/mL PROTEUS MIRABILIS (A)  Final   Report Status 01/11/2021 FINAL  Final   Organism ID, Bacteria PROTEUS MIRABILIS (A)  Final      Susceptibility   Proteus mirabilis - MIC*    AMPICILLIN <=2 SENSITIVE Sensitive     CEFAZOLIN <=4 SENSITIVE Sensitive     CEFEPIME <=0.12 SENSITIVE Sensitive     CEFTRIAXONE <=0.25 SENSITIVE Sensitive     CIPROFLOXACIN <=0.25 SENSITIVE Sensitive     GENTAMICIN <=1 SENSITIVE Sensitive      IMIPENEM 2 SENSITIVE Sensitive     NITROFURANTOIN RESISTANT Resistant     TRIMETH/SULFA <=20 SENSITIVE Sensitive     AMPICILLIN/SULBACTAM <=2 SENSITIVE Sensitive     PIP/TAZO <=4 SENSITIVE Sensitive     * >=100,000 COLONIES/mL PROTEUS MIRABILIS  Resp Panel by RT-PCR (Flu A&B, Covid) Nasopharyngeal Swab     Status: None   Collection Time: 01/09/21  3:27 PM   Specimen: Nasopharyngeal Swab; Nasopharyngeal(NP) swabs in vial transport medium  Result Value Ref Range Status   SARS Coronavirus 2 by RT PCR NEGATIVE NEGATIVE Final    Comment: (NOTE) SARS-CoV-2 target nucleic acids are NOT DETECTED.  The SARS-CoV-2 RNA is generally detectable in upper respiratory specimens during the acute phase of infection. The lowest concentration of SARS-CoV-2 viral copies this assay can detect is 138 copies/mL. A negative result does not preclude SARS-Cov-2 infection and should not be used as the sole basis for treatment or other patient management decisions. A negative result may occur with  improper specimen collection/handling, submission of specimen other than nasopharyngeal swab, presence of viral mutation(s) within the areas targeted by this assay, and inadequate number of viral copies(<138 copies/mL). A negative result must be combined with clinical observations, patient history, and epidemiological information. The expected result is Negative.  Fact Sheet for Patients:  BloggerCourse.com  Fact Sheet for Healthcare Providers:  SeriousBroker.it  This test is no t yet approved or cleared by the [place] FDA and  has been authorized for detection and/or diagnosis of SARS-CoV-2 by FDA under an Emergency Use Authorization (EUA). This EUA will remain  in effect (meaning this test can be used) for the duration of the COVID-19 declaration under Section 564(b)(1) of the Act, 21 U.S.C.section 360bbb-3(b)(1), unless the authorization is terminated  or  revoked sooner.       Influenza A by PCR NEGATIVE NEGATIVE Final   Influenza B by PCR NEGATIVE NEGATIVE Final  Comment: (NOTE) The Xpert Xpress SARS-CoV-2/FLU/RSV plus assay is intended as an aid in the diagnosis of influenza from Nasopharyngeal swab specimens and should not be used as a sole basis for treatment. Nasal washings and aspirates are unacceptable for Xpert Xpress SARS-CoV-2/FLU/RSV testing.  Fact Sheet for Patients: BloggerCourse.com  Fact Sheet for Healthcare Providers: SeriousBroker.it  This test is not yet approved or cleared by the [place] FDA and has been authorized for detection and/or diagnosis of SARS-CoV-2 by FDA under an Emergency Use Authorization (EUA). This EUA will remain in effect (meaning this test can be used) for the duration of the COVID-19 declaration under Section 564(b)(1) of the Act, 21 U.S.C. section 360bbb-3(b)(1), unless the authorization is terminated or revoked.  Performed at [facility], [address] [place], [place] [address]   Blood culture (single)     Status: None   Collection Time: 01/09/21  6:36 PM   Specimen: BLOOD  Result Value Ref Range Status   Specimen Description BLOOD LEFT ANTECUBITAL  Final   Special Requests   Final    BOTTLES DRAWN AEROBIC AND ANAEROBIC Blood Culture adequate volume   Culture   Final    NO GROWTH 5 DAYS Performed at [facility], [address], [place], [place] [address]    Report Status 01/14/2021 FINAL  Final      Studies: No results found.    Joycelyn Das, MD  [facility] 01/17/2021  If 7PM-7AM, please contact night-coverage

## 2021-01-17 NOTE — Progress Notes (Signed)
Occupational Therapy Treatment Patient Details Name: [name] [name] MRN: [id number] DOB: 16-Sep-1970 Today's Date: 01/17/2021   History of present illness [name] [name] is a 7yoM who comes to [facility] on [facility]/18 c blood in urine, also melena. PMH: STEMI, CVAx2, HTN, IDDM, gastroparesis, GAD, depression. Orthostatic BP on 9/21 at OT evaluation. At baseline pt uses a WC for basic mobility, household propulsion with mostly feet and LUE due to CVA related deficits.   OT comments  [name] [name] was seen for OT treatment on this date. Pt met at [employer] with PT. Pt tolerates ~20 ft x3 self propelling w/c in hallway. Pt requires MOD I don/doff shoes seated EOC. CGA + w/c standing to rasie blinds, no LOB with single UE support reaching inside BOS. MOD I setup meals sitting EOC. Pt making good progress toward goals. Pt continues to benefit from skilled OT services to maximize return to PLOF and minimize risk of future falls, injury, caregiver burden, and readmission. Will continue to follow POC. Discharge recommendation updated to reflect pt progress.     Recommendations for follow up therapy are one component of a multi-disciplinary discharge planning process, led by the attending physician.  Recommendations may be updated based on patient status, additional functional criteria and insurance authorization.    Follow Up Recommendations  Home health OT;Supervision - Intermittent    Equipment Recommendations  Wheelchair (measurements OT);Tub/shower bench    Recommendations for Other Services      Precautions / Restrictions Precautions Precautions: Fall Restrictions Weight Bearing Restrictions: No       Mobility Bed Mobility Overal bed mobility: Modified Independent Bed Mobility: Supine to Sit     Supine to sit: Modified independent (Device/Increase time);HOB elevated     General bed mobility comments: received and left seated    Transfers Overall transfer level: Needs assistance Equipment  used: None Transfers: Stand Pivot Transfers;Sit to/from Stand Sit to Stand: Min guard Stand pivot transfers: Supervision       General transfer comment: stand pivot bed to w/c (pt set brakes prior to transfer) towards L side    Balance Overall balance assessment: Needs assistance Sitting-balance support: No upper extremity supported;Feet supported Sitting balance-[name] [name] Scale: Good Sitting balance - Comments: steady sitting taking off socks and putting on shoes   Standing balance support: Single extremity supported;During functional activity Standing balance-[name] [name] Scale: Good                             ADL either performed or assessed with clinical judgement   ADL Overall ADL's : Needs assistance/impaired                                       General ADL Comments: MOD I don/doff shoes seated EOC. CGA + w/c standing to rasie blinds, no LOB with single UE support reaching inside BOS. MOD I setup meals sitting EOC.      Cognition Arousal/Alertness: Awake/alert Behavior During Therapy: WFL for tasks assessed/performed Overall Cognitive Status: Within Functional Limits for tasks assessed                                          Exercises Exercises: Other exercises Other Exercises Other Exercises: Pt educated re: OT role, DME recs, d/c recs, falls  prevention, ECS Other Exercises: LBD, sit<>stand, SPT, sitting balance/tolerance, self-drinking    Pertinent Vitals/ Pain       Pain Assessment: No/denies pain Pain Intervention(s): Limited activity within patient's tolerance;Monitored during session;Repositioned   Frequency  Min 1X/week        Progress Toward Goals  OT Goals(current goals can now be found in the care plan section)  Progress towards OT goals: Progressing toward goals  Acute Rehab OT Goals Patient Stated Goal: to return to PLOF OT Goal Formulation: With patient Time For Goal Achievement: 01/26/21 Potential  to Achieve Goals: Good ADL Goals Pt Will Perform Grooming: sitting;with modified independence Pt Will Transfer to Toilet: with modified independence;stand pivot transfer;bedside commode Additional ADL Goal #1: Pt will complete w/c level cooking/cleaning task c MOD I  Plan Frequency remains appropriate;Discharge plan needs to be updated    Co-evaluation                 AM-PAC OT "6 Clicks" Daily Activity     Outcome Measure   Help from another person eating meals?: None Help from another person taking care of personal grooming?: A Little Help from another person toileting, which includes using toliet, bedpan, or urinal?: A Little Help from another person bathing (including washing, rinsing, drying)?: A Little Help from another person to put on and taking off regular upper body clothing?: None Help from another person to put on and taking off regular lower body clothing?: None 6 Click Score: 21    End of Session    OT Visit Diagnosis: Other abnormalities of gait and mobility (R26.89)   Activity Tolerance Patient tolerated treatment well   Patient Left in chair;with call bell/phone within reach;with chair alarm set   Nurse Communication          Time: 8307-4600 OT Time Calculation (min): 14 min  Charges: OT General Charges $OT Visit: 1 Visit OT Treatments $Self Care/Home Management : 8-22 mins  Dessie Coma, M.S. OTR/L  01/17/21, 3:46 PM  ascom [phone]

## 2021-01-17 NOTE — TOC Progression Note (Addendum)
Transition of Care [facility]) - Progression Note    Patient Details  Name: [name] [name] MRN: [id number] Date of Birth: 04-05-1971  [facility]) CM/SW Contact  Gildardo Griffes, [place] Phone Number: 01/17/2021, 3:30 PM  Clinical Narrative:     CSW notes per PT/OT patient has progressed to home health level of care needed at dc. CSW spoke with patient regarding this and inquired as to options for discharge plan. Patient reports his sister and his girlfriend have homes however he chooses not to ask if he can stay there. Reports he would rather go to a shelter and he has some money he could use for a hotel if needed. TOC supervisor informed, agrees that patient can dc to shelter such as [facility] as discharge plan. Also provided discounted hotel rates sheet to patient at bedside as well as meal resources.   Patient can call [facility] at [phone] and dial [phone] to inquire as to bed availability. CSW attempted to do this for patient however they report patient must call himself. Information is on patient's resource list provided at bedside to call.   Taxi voucher provided to [employer] with address of [facility].    Expected Discharge Plan: Homeless Shelter Barriers to Discharge: Continued Medical Work up  Expected Discharge Plan and Services Expected Discharge Plan: Homeless Shelter   Discharge Planning Services: CM Consult   Living arrangements for the past 2 months: Apartment                 DME Arranged: N/A DME Agency: NA       HH Arranged: NA           Social Determinants of Health (SDOH) Interventions    Readmission Risk Interventions No flowsheet data found.

## 2021-01-18 ENCOUNTER — Other Ambulatory Visit (HOSPITAL_COMMUNITY): Payer: Self-pay

## 2021-01-18 LAB — GLUCOSE, CAPILLARY
Glucose-Capillary: 237 mg/dL — ABNORMAL HIGH (ref 70–99)
Glucose-Capillary: 156 mg/dL — ABNORMAL HIGH (ref 70–99)
Glucose-Capillary: 183 mg/dL — ABNORMAL HIGH (ref 70–99)
Glucose-Capillary: 216 mg/dL — ABNORMAL HIGH (ref 70–99)

## 2021-01-18 NOTE — Progress Notes (Signed)
Patient was seen and examined at bedside.  No interval changes compared to yesterday.  Patient was not discharged yesterday due to difficulty in transition.  Social worker and transition of care on board today to discharge appropriate disposition plan.  Please refer to discharge instructions on 01/17/2021.

## 2021-01-18 NOTE — TOC Progression Note (Addendum)
Transition of Care [facility]) - Progression Note    Patient Details  Name: CATRELL MORRONE MRN: [id number] Date of Birth: [birth year]  Transition of Care [facility]) CM/SW Contact  Margarito Liner, LCSW Phone Number: 01/18/2021, 10:49 AM  Clinical Narrative:   Tried calling [facility] to see how much his copay would be but no answer. Will try again later. Sent secure email to [facility] hospital liaison to see if she had any advise on what to do in this situation. Per pharmacist, if we do meds to bed, patient will still be responsible for copay amount.  12:42 pm: Spoke to [employer]. They said the medications that were sent over yesterday should cost around $32. Tried calling patient in the room but no answer. Will try again later or go to the room.  3:16 pm: Woke patient up from nap. He said he has not called any shelters today. List was in discharge packet. Gave to him and asked him to start calling around to see who has a bed today. He said he only has enough money to stay in a hotel for one night. He still does not want to ask his family or girlfriend if he can stay with them. Encouraged him to ask them to pick up his prescriptions at least. Told him to notify RN of update.  Expected Discharge Plan: Homeless Shelter Barriers to Discharge: Continued Medical Work up  Expected Discharge Plan and Services Expected Discharge Plan: Homeless Shelter   Discharge Planning Services: CM Consult   Living arrangements for the past 2 months: Apartment Expected Discharge Date: 01/17/21               DME Arranged: N/A DME Agency: NA       HH Arranged: NA           Social Determinants of Health (SDOH) Interventions    Readmission Risk Interventions No flowsheet data found.

## 2021-01-19 DIAGNOSIS — A419 Sepsis, unspecified organism: Secondary | ICD-10-CM | POA: Diagnosis not present

## 2021-01-19 LAB — GLUCOSE, CAPILLARY
Glucose-Capillary: 178 mg/dL — ABNORMAL HIGH (ref 70–99)
Glucose-Capillary: 229 mg/dL — ABNORMAL HIGH (ref 70–99)
Glucose-Capillary: 239 mg/dL — ABNORMAL HIGH (ref 70–99)
Glucose-Capillary: 249 mg/dL — ABNORMAL HIGH (ref 70–99)
Glucose-Capillary: 161 mg/dL — ABNORMAL HIGH (ref 70–99)

## 2021-01-19 NOTE — Plan of Care (Signed)
Pt progression adequate for d/c. Pt expressed concerned over needing food for tonight. Discussed with AD, [employer], take out meal tray ordered for pt consumption tonight post d/c. Awaiting tray arrival prior to d/c.

## 2021-01-19 NOTE — TOC Progression Note (Addendum)
Transition of Care [facility]) - Progression Note    Patient Details  Name: [name] [name] MRN: [id number] Date of Birth: [birth year]  Transition of Care [facility]) CM/SW Contact  Margarito Liner, LCSW Phone Number: 01/19/2021, 9:25 AM  Clinical Narrative:   Patient said he has not heard from any shelters yet but is calling around now. He said his family will not go get his prescriptions for him so he will call his girlfriend again. RN put in note this morning that [facility]. Left voicemail for [employer]. No email response yet from [facility] hospital liaison.  9:49 pm: Received call back from [place]. He stated they were trying to get in touch with patient with no success. They will email him as well. He said that finding housing typically takes at least 2-3 weeks but they are at times able to find funding to get him into a hotel until that can be arranged.  11:32 am: Patient will call Denyse Amass.  11:57 am: Patient left a Engineer, technical sales for [employer]. [place] Sp[place]iel he has not received an email yet.  Expected Discharge Plan: Homeless Shelter Barriers to Discharge: Continued Medical Work up  Expected Discharge Plan and Services Expected Discharge Plan: Homeless Shelter   Discharge Planning Services: CM Consult   Living arrangements for the past 2 months: Apartment Expected Discharge Date: 01/17/21               DME Arranged: N/A DME Agency: NA       HH Arranged: NA           Social Determinants of Health (SDOH) Interventions    Readmission Risk Interventions No flowsheet data found.

## 2021-01-19 NOTE — TOC Transition Note (Signed)
Transition of Care [facility]) - CM/SW Discharge Note   Patient Details  Name: [name] [name] MRN: [id number] Date of Birth: [birth year]  Transition of Care [facility]) CM/SW Contact:  Candie Chroman, LCSW Phone Number: 01/19/2021, 3:09 PM   Clinical Narrative:   Met with patient to get an update. He said he spoke with a lady that works with the [place] and they have arranged for him to stay at the [facility] here in [place]. He is unsure for how long. His girlfriend will pick up his prescriptions for him. Set up [employer] door-to-door service with wheelchair [employer]. They will be here around 4:15 to transport. Will email rider waiver to transportation dept. Asked Financial controller to notify nurse. No further concerns. CSW signing off.  Final next level of care: Other (comment) ([employer]) Barriers to Discharge: Barriers Resolved   Patient Goals and CMS Choice Patient states their goals for this hospitalization and ongoing recovery are:: Patient has no where to go at discharge he is homeless      Discharge Placement                Patient to be transferred to facility by: [facility] door-to-door services   Patient and family notified of of transfer: 01/19/21  Discharge Plan and Services   Discharge Planning Services: CM Consult            DME Arranged: N/A DME Agency: NA       HH Arranged: NA          Social Determinants of Health (SDOH) Interventions     Readmission Risk Interventions No flowsheet data found.

## 2021-01-19 NOTE — Progress Notes (Signed)
PROGRESS NOTE    [name] [name]   SKA:[id number]  DOB: [birth year]  DOA: 01/09/2021 PCP: [facility]   Brief Narrative:  [name] [name] is a 50 year old male with history of MI, CVA, hypertension, diabetes mellitus, gastroparesis and anxiety and depression who presented to the hospital with dysuria hematuria and chills.  In the ED the patient was noted to have a temperature of 99.2, tachycardia and tachypnea.  Lactic acid, WBC and glucose levels were elevated.  UA was consistent with a UTI.  CT of the abdomen showed bilateral punctate nonobstructive renal calculi. The patient was started on Azactam and admitted to the hospital to treat for UTI.  Cultures revealed Proteus UTI but he continued to be febrile despite antibiotics.  He was switched to Levaquin and has been subsequently improving - He has been waiting on a skilled nursing facility for discharge.   Subjective: No complaints    Assessment & Plan:   Principal Problem:   Sepsis (HCC) related to complicated urinary tract infection with Proteus with bilobed renal cyst - Azactam subsequently changed to Levaquin as the patient continued to be febrile - Continue Levaquin until 01/21/2021  Active Problems: Atypical chest pain - Troponins were negative and EKG unremarkable - Suspected to be secondary to GERD    Essential hypertension, benign -Continue on carvedilol, hydralazine, amlodipine and chlorthalidone    Type II diabetes mellitus with neurological manifestations, uncontrolled (HCC) -The patient takes Victoza and metformin at home - Continue insulin  History of CVA-wheelchair-bound - Continue aspirin Plavix and statin   Time spent in minutes: 35 DVT prophylaxis: enoxaparin (LOVENOX) injection 40 mg Start: 01/15/21 2200 SCDs Start: 01/09/21 2017  Code Status: Full code Family Communication:  Level of Care: Level of care: Med-Surg Disposition Plan:  Status is: Inpatient  Remains inpatient appropriate  because:Unsafe d/c plan  Dispo: The patient is from: Home              Anticipated d/c is to: SNF              Patient currently is medically stable to d/c.   Difficult to place patient Yes      Consultants:  Infectious disease  Procedures:  None Antimicrobials:  Anti-infectives (From admission, onward)    Start     Dose/Rate Route Frequency Ordered Stop   01/18/21 0000  levofloxacin (LEVAQUIN) 750 MG tablet        750 mg Oral Daily 01/17/21 1535 01/21/21 2359   01/15/21 0600  levofloxacin (LEVAQUIN) tablet 750 mg        750 mg Oral Daily 01/14/21 1410     01/12/21 0600  levofloxacin (LEVAQUIN) IVPB 750 mg  Status:  Discontinued        750 mg 100 mL/hr over 90 Minutes Intravenous Every 24 hours 01/11/21 1650 01/14/21 1410   01/10/21 2200  aztreonam (AZACTAM) 2 g in sodium chloride 0.9 % 100 mL IVPB        2 g 200 mL/hr over 30 Minutes Intravenous Every 8 hours 01/10/21 2111 01/11/21 2136   01/10/21 0600  aztreonam (AZACTAM) 2 g in sodium chloride 0.9 % 100 mL IVPB  Status:  Discontinued        2 g 200 mL/hr over 30 Minutes Intravenous Every 12 hours 01/09/21 2026 01/10/21 2111   01/09/21 1830  aztreonam (AZACTAM) 2 g in sodium chloride 0.9 % 100 mL IVPB        2 g 200 mL/hr over 30 Minutes  Intravenous  Once 01/09/21 1820 01/10/21 0027        Objective: Vitals:   01/18/21 2017 01/18/21 2358 01/19/21 0739 01/19/21 1154  BP: 117/84 123/73 126/88 111/82  Pulse: 69 75 70 73  Resp: 16 16 16 16   Temp: 98.4 F (36.9 C) 98.5 F (36.9 C) 97.9 F (36.6 C) 97.9 F (36.6 C)  TempSrc: Oral Oral    SpO2: 95% 94% 100% 99%  Weight:      Height:        Intake/Output Summary (Last 24 hours) at 01/19/2021 1540 Last data filed at 01/19/2021 1404 Gross per 24 hour  Intake 480 ml  Output 1275 ml  Net -795 ml   Filed Weights   01/09/21 1337  Weight: 81.6 kg    Examination: General exam: Appears comfortable  HEENT: PERRLA, oral mucosa moist, no sclera icterus or  thrush Respiratory system: Clear to auscultation. Respiratory effort normal. Cardiovascular system: S1 & S2 heard, RRR.   Gastrointestinal system: Abdomen soft, non-tender, nondistended. Normal bowel sounds. Central nervous system: Alert and oriented. No focal neurological deficits. Extremities: No cyanosis, clubbing or edema Skin: No rashes or ulcers Psychiatry:  Mood & affect appropriate.     Data Reviewed: I have personally reviewed following labs and imaging studies  CBC: Recent Labs  Lab 01/13/21 0547 01/14/21 0751  WBC 8.1 9.8  HGB 13.8 13.9  HCT 38.4* 39.8  MCV 87.3 85.8  PLT 201 253   Basic Metabolic Panel: Recent Labs  Lab 01/13/21 0547 01/14/21 0751 01/14/21 2211 01/15/21 0422 01/17/21 0417  NA 133* 133* 132* 134* 135  K 3.3* 3.7 3.9 3.8 3.6  CL 94* 98 96* 100 97*  CO2 30 29 31 29 24   GLUCOSE 173* 162* 232* 168* 169*  BUN 16 15 17 14 14   CREATININE 1.04 0.91 1.12 0.93 0.91  CALCIUM 8.8* 8.8* 8.7* 8.6* 9.2  MG 2.2  --   --   --   --    GFR: Estimated Creatinine Clearance: 99.3 mL/min (by C-G formula based on SCr of 0.91 mg/dL). Liver Function Tests: No results for input(s): AST, ALT, ALKPHOS, BILITOT, PROT, ALBUMIN in the last 168 hours. No results for input(s): LIPASE, AMYLASE in the last 168 hours. No results for input(s): AMMONIA in the last 168 hours. Coagulation Profile: No results for input(s): INR, PROTIME in the last 168 hours. Cardiac Enzymes: No results for input(s): CKTOTAL, CKMB, CKMBINDEX, TROPONINI in the last 168 hours. BNP (last 3 results) No results for input(s): PROBNP in the last 8760 hours. HbA1C: No results for input(s): HGBA1C in the last 72 hours. CBG: Recent Labs  Lab 01/18/21 2356 01/19/21 0338 01/19/21 0736 01/19/21 0920 01/19/21 1152  GLUCAP 249* 239* 178* 229* 161*   Lipid Profile: No results for input(s): CHOL, HDL, LDLCALC, TRIG, CHOLHDL, LDLDIRECT in the last 72 hours. Thyroid Function Tests: No results for  input(s): TSH, T4TOTAL, FREET4, T3FREE, THYROIDAB in the last 72 hours. Anemia Panel: No results for input(s): VITAMINB12, FOLATE, FERRITIN, TIBC, IRON, RETICCTPCT in the last 72 hours. Urine analysis:    Component Value Date/Time   COLORURINE AMBER (A) 01/09/2021 1525   APPEARANCEUR CLOUDY (A) 01/09/2021 1525   LABSPEC 1.015 01/09/2021 1525   PHURINE 5.5 01/09/2021 1525   GLUCOSEU >1,000 (A) 01/09/2021 1525   HGBUR LARGE (A) 01/09/2021 1525   BILIRUBINUR SMALL (A) 01/09/2021 1525   KETONESUR TRACE (A) 01/09/2021 1525   PROTEINUR >300 (A) 01/09/2021 1525   NITRITE NEGATIVE 01/09/2021 1525  LEUKOCYTESUR NEGATIVE 01/09/2021 1525   Sepsis Labs: @LABRCNTIP (procalcitonin:4,lacticidven:4) ) Recent Results (from the past 240 hour(s))  Blood culture (single)     Status: None   Collection Time: 01/09/21  6:36 PM   Specimen: BLOOD  Result Value Ref Range Status   Specimen Description BLOOD LEFT ANTECUBITAL  Final   Special Requests   Final    BOTTLES DRAWN AEROBIC AND ANAEROBIC Blood Culture adequate volume   Culture   Final    NO GROWTH 5 DAYS Performed at [facility], [address] [place], [place]    Report Status 01/14/2021 FINAL  Final         Radiology Studies: No results found.    Scheduled Meds:  amLODipine  10 mg Oral Daily   aspirin EC  81 mg Oral Daily   atorvastatin  80 mg Oral Daily   buPROPion  150 mg Oral Daily   carvedilol  25 mg Oral BID   chlorthalidone  25 mg Oral Daily   citalopram  40 mg Oral Daily   clopidogrel  75 mg Oral Daily   dicyclomine  10 mg Oral TID AC & HS   enoxaparin (LOVENOX) injection  40 mg Subcutaneous Q24H   insulin aspart  0-15 Units Subcutaneous TID AC & HS   insulin aspart  9 Units Subcutaneous TID WC   insulin glargine-yfgn  25 Units Subcutaneous QHS   isosorbide mononitrate  30 mg Oral Daily   levofloxacin  750 mg Oral Daily   multivitamin with minerals  1 tablet Oral Daily   pantoprazole  40 mg  Oral BID AC   potassium chloride  40 mEq Oral Daily   sodium chloride flush  3 mL Intravenous Q12H   tamsulosin  0.4 mg Oral Daily   Continuous Infusions:   LOS: 10 days      01/16/2021, MD [facility] Pager: [email or web address] 01/19/2021, 3:40 PM

## 2021-01-19 NOTE — Plan of Care (Signed)
[facility] Flinchum [facility] and [facility].  Contact is Rosalin Hawking, [phone].  I LM on VM and gave name and # to pt.

## 2021-01-25 NOTE — Progress Notes (Incomplete)
01/25/21 3:34 PM   [name] [name] Jun 02, 1970 [id number]  Referring provider:  [facility] [address] [place],  [place] [address] No chief complaint on file.    HPI: [name] [name] is a 50 y.o.male with a personal history of , who presents today for follow-up with urinalysis.   He was recently seen in the ED on 01/09/2021 for treatment and evaluation of hematuria. Urine culture for this visit grew Proteus mirabilis. Urinalysis showed >1000  glucose, large blood, trace ketones, and > 300 proteins. He underwent a CT renal stone study that revealed single punctuate bilateral nonobstructive renal calculi, complex cyst of the left mid kidney roughly similar to prior exams, and diffuse hepatic steatosis.     PMH: Past Medical History:  Diagnosis Date   Diabetes mellitus without complication (HCC)    Hypertension    Stroke [facility])     Surgical History: Past Surgical History:  Procedure Laterality Date   APPENDECTOMY     BACK SURGERY     CORONARY/GRAFT ACUTE MI REVASCULARIZATION N/A 01/23/2020   Procedure: Coronary/Graft Acute MI Revascularization;  Surgeon: Alwyn Pea, MD;  Location: [facility] INVASIVE CV LAB;  Service: Cardiovascular;  Laterality: N/A;   LEFT HEART CATH AND CORONARY ANGIOGRAPHY N/A 01/23/2020   Procedure: LEFT HEART CATH AND CORONARY ANGIOGRAPHY;  Surgeon: Alwyn Pea, MD;  Location: [facility] INVASIVE CV LAB;  Service: Cardiovascular;  Laterality: N/A;    Home Medications:  Allergies as of 01/26/2021       Reactions   Penicillins Other (See Comments), Anaphylaxis   Has patient had a PCN reaction causing immediate rash, facial/tongue/throat swelling, SOB or lightheadedness with hypotension: Yes Has patient had a PCN reaction causing severe rash involving mucus membranes or skin necrosis: No Has patient had a PCN reaction that required hospitalization: No Has patient had a PCN reaction occurring within the last 10 years: No If all of the above  answers are "NO", then may proceed with Cephalosporin use.        Medication List        Accurate as of January 25, 2021  3:34 PM. If you have any questions, ask your nurse or doctor.          alum & mag hydroxide-simeth 200-200-20 MG/5ML suspension Commonly known as: MAALOX/MYLANTA Take 30 mLs by mouth every 6 (six) hours as needed for indigestion or heartburn.   amLODipine 10 MG tablet Commonly known as: NORVASC Take 1 tablet by mouth daily.   aspirin 81 MG EC tablet Take 81 mg by mouth daily.   atorvastatin 80 MG tablet Commonly known as: LIPITOR Take 80 mg by mouth daily.   buPROPion 150 MG 24 hr tablet Commonly known as: WELLBUTRIN XL Take 150 mg by mouth daily.   carvedilol 25 MG tablet Commonly known as: COREG Take 25 mg by mouth in the morning and at bedtime.   chlorthalidone 25 MG tablet Commonly known as: HYGROTON Take 25 mg by mouth daily.   citalopram 40 MG tablet Commonly known as: CELEXA Take 40 mg by mouth daily.   clopidogrel 75 MG tablet Commonly known as: PLAVIX Take 75 mg by mouth daily.   dicyclomine 10 MG capsule Commonly known as: BENTYL Take 1 capsule (10 mg total) by mouth 4 (four) times daily -  before meals and at bedtime for 5 days.   folic acid 1 MG tablet Commonly known as: FOLVITE Take 1 mg by mouth daily.   insulin aspart 100 UNIT/ML  injection Commonly known as: NovoLOG Inject 5 Units into the skin 3 (three) times daily with meals. What changed: how much to take   insulin glargine 100 UNIT/ML injection Commonly known as: LANTUS Inject 40 Units into the skin at bedtime.   isosorbide mononitrate 30 MG 24 hr tablet Commonly known as: IMDUR Take 1 tablet (30 mg total) by mouth daily.   JARDIANCE PO Take by mouth. Pt reports taking 1/2 pill per day; not sure of dose   losartan 100 MG tablet Commonly known as: COZAAR Take 100 mg by mouth every evening.   metoCLOPramide 10 MG tablet Commonly known as: REGLAN Take 1  tablet (10 mg total) by mouth every 12 (twelve) hours as needed for nausea.   Multivitamin Adult Tabs Take 1 tablet by mouth daily.   pantoprazole 20 MG tablet Commonly known as: PROTONIX Take 1 tablet (20 mg total) by mouth daily.   simethicone 80 MG chewable tablet Commonly known as: MYLICON Chew 2 tablets (160 mg total) by mouth 4 (four) times daily as needed for flatulence.   tamsulosin 0.4 MG Caps capsule Commonly known as: FLOMAX Take 0.4 mg by mouth daily.        Allergies:  Allergies  Allergen Reactions   Penicillins Other (See Comments) and Anaphylaxis    Has patient had a PCN reaction causing immediate rash, facial/tongue/throat swelling, SOB or lightheadedness with hypotension: Yes Has patient had a PCN reaction causing severe rash involving mucus membranes or skin necrosis: No Has patient had a PCN reaction that required hospitalization: No Has patient had a PCN reaction occurring within the last 10 years: No If all of the above answers are "NO", then may proceed with Cephalosporin use.     Family History: Family History  Problem Relation Age of Onset   Stroke Mother    Heart failure Mother    Heart failure Father    Diabetes Father     Social History:  reports that he has been smoking cigarettes. He has been smoking an average of .25 packs per day. He has never used smokeless tobacco. He reports that he does not drink alcohol and does not use drugs.   Physical Exam: There were no vitals taken for this visit.  Constitutional:  Alert and oriented, No acute distress. HEENT: [place] AT, moist mucus membranes.  Trachea midline, no masses. Cardiovascular: No clubbing, cyanosis, or edema. Respiratory: Normal respiratory effort, no increased work of breathing Skin: No rashes, bruises or suspicious lesions. Neurologic: Grossly intact, no focal deficits, moving all 4 extremities. Psychiatric: Normal mood and affect.  Laboratory Data:  Lab Results  Component  Value Date   CREATININE 0.91 01/17/2021     Lab Results  Component Value Date   HGBA1C 10.9 (H) 01/09/2021    Urinalysis   Pertinent Imaging: CLINICAL DATA:  Hematuria. The patient takes Plavix for prior stroke.   EXAM: CT ABDOMEN AND PELVIS WITHOUT CONTRAST   TECHNIQUE: Multidetector CT imaging of the abdomen and pelvis was performed following the standard protocol without IV contrast.   COMPARISON:  01/02/2021 CT abdomen/pelvis with contrast from [facility].   FINDINGS: Lower chest: The bottom of a loop recorder is visible on the scout image. The lung bases appear clear.   Hepatobiliary: Diffuse hepatic steatosis. Minimal sparing along the gallbladder fossa. Calcification adjacent to a small hypodense subcapsular lesion posteriorly in the right hepatic lobe on image 18 series 2 unchanged from prior.   Pancreas: Unremarkable   Spleen: Unremarkable  Adrenals/Urinary Tract: Both adrenal glands appear normal. Stable 3 mm right mid to lower kidney nonobstructive renal calculus on image 81 series 5.   Suspected 1-2 mm nonobstructive left mid kidney calculus.   Complex left renal cystic lesion with a higher density marginal element, bilobed appearance with a septation, and some calcification along the inferior margin of the dominant exophytic portion. Today's exam is performed without IV contrast and a Bosniak classification cannot be assigned.   Stomach/Bowel: Prominent stool throughout the colon favors constipation. Wall thickening in the lower rectum on image 88 of series 2 was not present 1 week ago and accordingly is probably peristaltic or incidental. Appendix surgically absent.   Vascular/Lymphatic: Generally mild atherosclerotic calcifications are observed. No pathologic adenopathy.   Reproductive: Unremarkable   Other: No supplemental non-categorized findings.   Musculoskeletal: Suspected small focus of chronic AVN in the left femoral  head anteriorly.   IMPRESSION: 1. Single punctate bilateral nonobstructive renal calculi. 2. Complex cyst of the left mid kidney roughly similar to prior exams, noncontrast CT is not suitable for assigning a Bosniak classification. If further workup of this lesion is warranted, I would suggest renal protocol MRI with and without contrast on a elective basis. 3. Diffuse hepatic steatosis. 4.  Prominent stool throughout the colon favors constipation. 5. Mild atherosclerotic calcifications. 6. 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     Electronically Signed   By: Gaylyn Rong M.D.   On: 01/09/2021 17:00   Assessment & Plan:     No follow-ups on file.  I,Kailey Littlejohn,acting as a Neurosurgeon for Vanna Scotland, MD.,have documented all relevant documentation on the behalf of Vanna Scotland, MD,as directed by  Vanna Scotland, MD while in the presence of Vanna Scotland, MD.  [facility] [address] [place], [place] [address] [phone]

## 2021-01-26 ENCOUNTER — Ambulatory Visit: Payer: Self-pay | Admitting: Urology

## 2021-01-28 ENCOUNTER — Encounter: Payer: Self-pay | Admitting: Urology

## 2021-07-19 ENCOUNTER — Emergency Department (HOSPITAL_COMMUNITY)
Admission: EM | Admit: 2021-07-19 | Discharge: 2021-07-20 | Disposition: A | Discharge status: Home / Self Care | Payer: No Typology Code available for payment source | Attending: Emergency Medicine | Admitting: Emergency Medicine

## 2021-07-19 ENCOUNTER — Other Ambulatory Visit: Payer: Self-pay

## 2021-07-19 ENCOUNTER — Emergency Department (HOSPITAL_COMMUNITY): Payer: No Typology Code available for payment source

## 2021-07-19 ENCOUNTER — Encounter (HOSPITAL_COMMUNITY): Payer: Self-pay

## 2021-07-19 DIAGNOSIS — E11621 Type 2 diabetes mellitus with foot ulcer: Secondary | ICD-10-CM | POA: Diagnosis not present

## 2021-07-19 DIAGNOSIS — L97519 Non-pressure chronic ulcer of other part of right foot with unspecified severity: Secondary | ICD-10-CM | POA: Insufficient documentation

## 2021-07-19 DIAGNOSIS — Z794 Long term (current) use of insulin: Secondary | ICD-10-CM | POA: Insufficient documentation

## 2021-07-19 DIAGNOSIS — Z7902 Long term (current) use of antithrombotics/antiplatelets: Secondary | ICD-10-CM | POA: Diagnosis not present

## 2021-07-19 DIAGNOSIS — Z79899 Other long term (current) drug therapy: Secondary | ICD-10-CM | POA: Insufficient documentation

## 2021-07-19 DIAGNOSIS — E1165 Type 2 diabetes mellitus with hyperglycemia: Secondary | ICD-10-CM | POA: Insufficient documentation

## 2021-07-19 DIAGNOSIS — Z7982 Long term (current) use of aspirin: Secondary | ICD-10-CM | POA: Diagnosis not present

## 2021-07-19 DIAGNOSIS — D72829 Elevated white blood cell count, unspecified: Secondary | ICD-10-CM | POA: Insufficient documentation

## 2021-07-19 DIAGNOSIS — E118 Type 2 diabetes mellitus with unspecified complications: Secondary | ICD-10-CM

## 2021-07-19 DIAGNOSIS — I10 Essential (primary) hypertension: Secondary | ICD-10-CM | POA: Diagnosis not present

## 2021-07-19 DIAGNOSIS — I2510 Atherosclerotic heart disease of native coronary artery without angina pectoris: Secondary | ICD-10-CM | POA: Diagnosis not present

## 2021-07-19 DIAGNOSIS — M7989 Other specified soft tissue disorders: Secondary | ICD-10-CM | POA: Diagnosis present

## 2021-07-19 LAB — CBC WITH DIFFERENTIAL/PLATELET
Abs Immature Granulocytes: 0.03 10*3/uL (ref 0.00–0.07)
Basophils Absolute: 0.1 10*3/uL (ref 0.0–0.1)
Basophils Relative: 1 %
Eosinophils Absolute: 0.1 10*3/uL (ref 0.0–0.5)
Eosinophils Relative: 1 %
HCT: 45.4 % (ref 39.0–52.0)
Hemoglobin: 15.5 g/dL (ref 13.0–17.0)
Immature Granulocytes: 0 %
Lymphocytes Relative: 21 %
Lymphs Abs: 2.4 10*3/uL (ref 0.7–4.0)
MCH: 29 pg (ref 26.0–34.0)
MCHC: 34.1 g/dL (ref 30.0–36.0)
MCV: 85 fL (ref 80.0–100.0)
Monocytes Absolute: 0.9 10*3/uL (ref 0.1–1.0)
Monocytes Relative: 8 %
Neutro Abs: 8 10*3/uL — ABNORMAL HIGH (ref 1.7–7.7)
Neutrophils Relative %: 69 %
Platelets: 323 10*3/uL (ref 150–400)
RBC: 5.34 MIL/uL (ref 4.22–5.81)
RDW: 11.9 % (ref 11.5–15.5)
WBC: 11.5 10*3/uL — ABNORMAL HIGH (ref 4.0–10.5)
nRBC: 0 % (ref 0.0–0.2)

## 2021-07-19 LAB — COMPREHENSIVE METABOLIC PANEL
ALT: 41 U/L (ref 0–44)
AST: 40 U/L (ref 15–41)
Albumin: 3.9 g/dL (ref 3.5–5.0)
Alkaline Phosphatase: 97 U/L (ref 38–126)
Anion gap: 8 (ref 5–15)
BUN: 23 mg/dL — ABNORMAL HIGH (ref 6–20)
CO2: 26 mmol/L (ref 22–32)
Calcium: 10.1 mg/dL (ref 8.9–10.3)
Chloride: 97 mmol/L — ABNORMAL LOW (ref 98–111)
Creatinine, Ser: 1 mg/dL (ref 0.61–1.24)
GFR, Estimated: >60 mL/min (ref >60)
Glucose, Bld: 272 mg/dL — ABNORMAL HIGH (ref 70–99)
Potassium: 3.8 mmol/L (ref 3.5–5.1)
Sodium: 131 mmol/L — ABNORMAL LOW (ref 135–145)
Total Bilirubin: 0.5 mg/dL (ref 0.3–1.2)
Total Protein: 8.5 g/dL — ABNORMAL HIGH (ref 6.5–8.1)

## 2021-07-19 NOTE — ED Provider Triage Note (Signed)
Emergency Medicine Provider Triage Evaluation Note ? ?[name] [name] , a 51 y.o. male  was evaluated in triage.  Pt complains of right toe discoloration for an unknown period of time to report to the room he walked in today and noticed his right great toe on the plantar aspect was black.  He does have prior history of wounds, however he reports he did not notice this when.  There is some decrease sensation and numbness to the area ? ?Review of Systems  ?Positive: Wound ?Negative: Fever, chills ? ?Physical Exam  ?BP (!) 176/106 (BP Location: Left Arm)   Pulse (!) 102   Temp 97.6 ?F (36.4 ?C) (Oral)   Resp 17   Ht 5\' 9"  (1.753 m)   Wt 81.6 kg   SpO2 99%   BMI 26.58 kg/m?  ?Gen:   Awake, no distress   ?Resp:  Normal effort  ?MSK:   Moves extremities without difficulty  ?Other:  Palpable 1+ DP, PT pulses, swelling to the right foot with some surrounding erythema.  ? ?Medical Decision Making  ?Medically screening exam initiated at 9:34 PM.  Appropriate orders placed.  FAMOUS EISENHARDT was informed that the remainder of the evaluation will be completed by another provider, this initial triage assessment does not replace that evaluation, and the importance of remaining in the ED until their evaluation is complete. ? ? ?  ?[name] Govern, PA-C ?07/19/21 2138 ? ?

## 2021-07-19 NOTE — ED Provider Notes (Signed)
?[facility]-EMERGENCY DEPT ?Provider Note ? ? ?CSN: [id number] ?Arrival date & time: 07/19/21  2045 ? ?  ? ?History ? ?Chief Complaint  ?Patient presents with  ? Foot Swelling  ? ? ?[name] [name] is a 51 y.o. male with a hx of prior stroke, wheel chair bound, DM, hypertension, and CAD who presents to the emergency department with complaints of right great toe wound which he noted today.  Patient states that his roommate noted the wound and advised that he get checked out.  He states that he is unaware how long the wound has been there.  He has noticed some redness to the foot but is unsure for what period of time.  No alleviating or aggravating factors.  He denies fever, chills, numbness, weakness, or arthralgias. ? ?HPI ? ?  ? ?Home Medications ?Prior to Admission medications   ?Medication Sig Start Date End Date Taking? Authorizing Provider  ?alum & mag hydroxide-simeth (MAALOX/MYLANTA) 200-200-20 MG/5ML suspension Take 30 mLs by mouth every 6 (six) hours as needed for indigestion or heartburn. 01/17/21   Pokhrel, Rebekah Chesterfield, MD  ?amLODipine (NORVASC) 10 MG tablet Take 1 tablet by mouth daily. 12/10/20   [provider]  ?aspirin 81 MG EC tablet Take 81 mg by mouth daily. 10/18/20   [provider]  ?atorvastatin (LIPITOR) 80 MG tablet Take 80 mg by mouth daily.    [provider]  ?buPROPion (WELLBUTRIN XL) 150 MG 24 hr tablet Take 150 mg by mouth daily.    [provider]  ?carvedilol (COREG) 25 MG tablet Take 25 mg by mouth in the morning and at bedtime. 07/08/20   [provider]  ?chlorthalidone (HYGROTON) 25 MG tablet Take 25 mg by mouth daily. 12/07/20   [provider]  ?citalopram (CELEXA) 40 MG tablet Take 40 mg by mouth daily.    [provider]  ?clopidogrel (PLAVIX) 75 MG tablet Take 75 mg by mouth daily. 10/18/20   [provider]  ?dicyclomine (BENTYL) 10 MG capsule Take 1 capsule (10 mg total) by mouth 4 (four) times  daily -  before meals and at bedtime for 5 days. 01/17/21 01/22/21  Pokhrel, Rebekah Chesterfield, MD  ?Empagliflozin (JARDIANCE PO) Take by mouth. Pt reports taking 1/2 pill per day; not sure of dose    [provider]  ?folic acid (FOLVITE) 1 MG tablet Take 1 mg by mouth daily.    [provider]  ?insulin aspart (NOVOLOG) 100 UNIT/ML injection Inject 5 Units into the skin 3 (three) times daily with meals. ?Patient taking differently: Inject 10 Units into the skin 3 (three) times daily with meals. 01/25/20 02/19/21  Darlin Priestly, MD  ?insulin glargine (LANTUS) 100 UNIT/ML injection Inject 40 Units into the skin at bedtime.    [provider]  ?isosorbide mononitrate (IMDUR) 30 MG 24 hr tablet Take 1 tablet (30 mg total) by mouth daily. 01/18/21   Pokhrel, Rebekah Chesterfield, MD  ?losartan (COZAAR) 100 MG tablet Take 100 mg by mouth every evening.    [provider]  ?metoCLOPramide (REGLAN) 10 MG tablet Take 1 tablet (10 mg total) by mouth every 12 (twelve) hours as needed for nausea. 01/02/21   Henderly, Britni A, PA-C  ?Multiple Vitamins-Minerals (MULTIVITAMIN ADULT) TABS Take 1 tablet by mouth daily.    [provider]  ?pantoprazole (PROTONIX) 20 MG tablet Take 1 tablet (20 mg total) by mouth daily. 01/02/21   Henderly, Britni A, PA-C  ?simethicone (MYLICON) 80 MG chewable tablet  Chew 2 tablets (160 mg total) by mouth 4 (four) times daily as needed for flatulence. 01/17/21   Pokhrel, Rebekah Chesterfield, MD  ?tamsulosin (FLOMAX) 0.4 MG CAPS capsule Take 0.4 mg by mouth daily.    [provider]  ?   ? ?Allergies    ?Penicillins, Empagliflozin, Liraglutide, and Penicillin g   ? ?Review of Systems   ?Review of Systems  ?Constitutional:  Negative for chills and fever.  ?Respiratory:  Negative for shortness of breath.   ?Cardiovascular:  Negative for chest pain.  ?Gastrointestinal:  Negative for abdominal pain.  ?Skin:  Positive for color change and wound.  ?Neurological:  Negative for weakness and numbness.   ?All other systems reviewed and are negative. ? ?Physical Exam ?Updated Vital Signs ?BP (!) 176/106 (BP Location: Left Arm)   Pulse (!) 102   Temp 97.6 ?F (36.4 ?C) (Oral)   Resp 17   Ht 5\' 9"  (1.753 m)   Wt 81.6 kg   SpO2 99%   BMI 26.58 kg/m?  ?Physical Exam ?Vitals and nursing note reviewed.  ?Constitutional:   ?   General: He is not in acute distress. ?   Appearance: He is not ill-appearing or toxic-appearing.  ?HENT:  ?   Head: Normocephalic and atraumatic.  ?Cardiovascular:  ?   Pulses:     ?     Dorsalis pedis pulses are 1+ on the right side and 1+ on the left side.  ?     Posterior tibial pulses are 1+ on the right side and 1+ on the left side.  ?   Comments: DP/PT dopplerable pulses bilaterally.  ?Pulmonary:  ?   Effort: Pulmonary effort is normal.  ?Musculoskeletal:  ?   Comments: Lower extremities: Patient has a wound present to the plantar aspect of the right great toe as well as smaller wound to the dorsal foot as pictured below. There is erythema to the dorsal aspect of the foot. Patient has intact AROM to bilateral hips, knees, ankles, and all digits. No significant tenderness to palpation.   ?Skin: ?   General: Skin is warm and dry.  ?   Capillary Refill: Capillary refill takes less than 2 seconds.  ?Neurological:  ?   Mental Status: He is alert.  ?   Comments: Alert. Clear speech. Sensation grossly intact to bilateral lower extremities. 5/5 strength with plantar/dorsiflexion bilaterally.  ?Psychiatric:     ?   Mood and Affect: Mood normal.     ?   Behavior: Behavior normal.  ? ? ? ? ? ? ? ? ? ? ?ED Results / Procedures / Treatments   ?Labs ?(all labs ordered are listed, but only abnormal results are displayed) ?Labs Reviewed  ?CBC WITH DIFFERENTIAL/PLATELET - Abnormal; Notable for the following components:  ?    Result Value  ? WBC 11.5 (*)   ? Neutro Abs 8.0 (*)   ? All other components within normal limits  ?COMPREHENSIVE METABOLIC PANEL - Abnormal; Notable for the following components:  ?  Sodium 131 (*)   ? Chloride 97 (*)   ? Glucose, Bld 272 (*)   ? BUN 23 (*)   ? Total Protein 8.5 (*)   ? All other components within normal limits  ?CULTURE, BLOOD (ROUTINE X 2)  ?CULTURE, BLOOD (ROUTINE X 2)  ? ? ?EKG ?None ? ?Radiology ?DG Foot Complete Right ? ?Result Date: 07/19/2021 ?CLINICAL DATA:  First toe necrosis, initial encounter EXAM: RIGHT FOOT COMPLETE - 3+ VIEW COMPARISON:  None.  FINDINGS: There is no evidence of fracture or dislocation. There is no evidence of arthropathy or other focal bone abnormality. Soft tissues are unremarkable. IMPRESSION: No acute abnormality noted. Electronically Signed   By: Alcide Clever M.D.   On: 07/19/2021 22:55   ? ?Procedures ?Procedures  ? ? ?Medications Ordered in ED ?Medications - No data to display ? ?ED Course/ Medical Decision Making/ A&P ?  ?                        ?Medical Decision Making ?Risk ?Prescription drug management. ? ? ?Patient presents to the ED with complaints of right foot wound, this involves an extensive number of treatment options, and is a complaint that carries with it a high risk of complications and morbidity. Nontoxic, vitals w/ hypertension- doubt HTN emergency..  ? ?Additional history obtained:  ?Chart & nursing note reviewed.  ? ?Lab Tests:  ?I reviewed & interpreted labs including:  ?CBC: mild leukocytosis.  ?CMP: hyperglycemia without acidosis or anion gap elevation.  ?Blood cultures: Pending.  ? ?Imaging Studies ordered:  ?I ordered and viewed the following imaging, agree with radiologist impression:  ?Right foot xray: No acute abnormality noted. ? ?ED Course:  ?Patient with diabetic foot wound, concern for degree of infection, however wound does appear fairly chronic in nature.  There is no purulence.  Does not appear to be particularly deep.  X-ray does not show findings of osteomyelitis or underlying fracture.  Labs with hyperglycemia but without acidosis or anion gap elevation. Discussed w/ attending- will tx with abx and have  patient follow up closely with podiatry. I discussed results, treatment plan, need for follow-up, and return precautions with the patient and his son. Provided opportunity for questions, patient and his son co

## 2021-07-19 NOTE — ED Triage Notes (Signed)
Reports roommate seeing foot sticking out of blanket today and noticing wound on bottom of right foot. Diabetic. Right foot swelling for unknown amount of time.  ?

## 2021-07-20 MED ORDER — DOXYCYCLINE HYCLATE 100 MG PO CAPS: 100.0000 mg | ORAL_CAPSULE | Freq: Two times a day (BID) | ORAL | 0 refills | Status: AC

## 2021-07-20 MED ORDER — DOXYCYCLINE HYCLATE 100 MG PO TABS
100.0000 mg | ORAL_TABLET | Freq: Once | ORAL | Status: AC
  Administered 2021-07-20: 100 mg via ORAL
  Filled 2021-07-20: qty 1

## 2021-07-20 NOTE — Discharge Instructions (Addendum)
You were seen in the ER today for a foot wound.  ?Your xray was normal.  ?Your labs show that your blood sugar was elevated, please have this rechecked by your primary care provider.  Please take doxycycline twice per day for the next 10 days.  This is an antibiotic.  Please take this with food to help with possible stomach irritation that could occur. ? ?We have prescribed you new medication(s) today. Discuss the medications prescribed today with your pharmacist as they can have adverse effects and interactions with your other medicines including over the counter and prescribed medications. Seek medical evaluation if you start to experience new or abnormal symptoms after taking one of these medicines, seek care immediately if you start to experience difficulty breathing, feeling of your throat closing, facial swelling, or rash as these could be indications of a more serious allergic reaction ? ? ?We would like you to follow-up closely with podiatry, call today to schedule soonest available follow-up appointment.  Return to the emergency department for new or worsening symptoms including but not limited to new or worsening pain, spreading redness, fever, drainage from the wound, or any other concerns. ?

## 2021-07-23 ENCOUNTER — Emergency Department (HOSPITAL_COMMUNITY): Payer: No Typology Code available for payment source

## 2021-07-23 ENCOUNTER — Other Ambulatory Visit: Payer: Self-pay

## 2021-07-23 ENCOUNTER — Encounter (HOSPITAL_COMMUNITY): Payer: Self-pay

## 2021-07-23 ENCOUNTER — Inpatient Hospital Stay (HOSPITAL_COMMUNITY)
Admission: EM | Admit: 2021-07-23 | Discharge: 2021-08-22 | DRG: 853 | Disposition: E | Payer: No Typology Code available for payment source | Attending: Cardiology | Admitting: Cardiology

## 2021-07-23 DIAGNOSIS — I459 Conduction disorder, unspecified: Secondary | ICD-10-CM | POA: Diagnosis present

## 2021-07-23 DIAGNOSIS — I5023 Acute on chronic systolic (congestive) heart failure: Secondary | ICD-10-CM

## 2021-07-23 DIAGNOSIS — E876 Hypokalemia: Secondary | ICD-10-CM | POA: Diagnosis not present

## 2021-07-23 DIAGNOSIS — L97519 Non-pressure chronic ulcer of other part of right foot with unspecified severity: Secondary | ICD-10-CM | POA: Diagnosis present

## 2021-07-23 DIAGNOSIS — J9601 Acute respiratory failure with hypoxia: Secondary | ICD-10-CM | POA: Diagnosis present

## 2021-07-23 DIAGNOSIS — I5021 Acute systolic (congestive) heart failure: Secondary | ICD-10-CM | POA: Diagnosis present

## 2021-07-23 DIAGNOSIS — F17210 Nicotine dependence, cigarettes, uncomplicated: Secondary | ICD-10-CM | POA: Diagnosis present

## 2021-07-23 DIAGNOSIS — R570 Cardiogenic shock: Secondary | ICD-10-CM

## 2021-07-23 DIAGNOSIS — I519 Heart disease, unspecified: Secondary | ICD-10-CM | POA: Diagnosis not present

## 2021-07-23 DIAGNOSIS — I2510 Atherosclerotic heart disease of native coronary artery without angina pectoris: Secondary | ICD-10-CM | POA: Diagnosis present

## 2021-07-23 DIAGNOSIS — E11628 Type 2 diabetes mellitus with other skin complications: Secondary | ICD-10-CM | POA: Diagnosis present

## 2021-07-23 DIAGNOSIS — I110 Hypertensive heart disease with heart failure: Secondary | ICD-10-CM | POA: Diagnosis present

## 2021-07-23 DIAGNOSIS — Z794 Long term (current) use of insulin: Secondary | ICD-10-CM

## 2021-07-23 DIAGNOSIS — Z79899 Other long term (current) drug therapy: Secondary | ICD-10-CM

## 2021-07-23 DIAGNOSIS — A419 Sepsis, unspecified organism: Secondary | ICD-10-CM | POA: Diagnosis present

## 2021-07-23 DIAGNOSIS — I2109 ST elevation (STEMI) myocardial infarction involving other coronary artery of anterior wall: Secondary | ICD-10-CM | POA: Diagnosis present

## 2021-07-23 DIAGNOSIS — I219 Acute myocardial infarction, unspecified: Secondary | ICD-10-CM | POA: Diagnosis not present

## 2021-07-23 DIAGNOSIS — I509 Heart failure, unspecified: Secondary | ICD-10-CM | POA: Diagnosis not present

## 2021-07-23 DIAGNOSIS — E785 Hyperlipidemia, unspecified: Secondary | ICD-10-CM | POA: Diagnosis present

## 2021-07-23 DIAGNOSIS — E11621 Type 2 diabetes mellitus with foot ulcer: Secondary | ICD-10-CM | POA: Diagnosis present

## 2021-07-23 DIAGNOSIS — E1165 Type 2 diabetes mellitus with hyperglycemia: Secondary | ICD-10-CM | POA: Diagnosis present

## 2021-07-23 DIAGNOSIS — Z833 Family history of diabetes mellitus: Secondary | ICD-10-CM

## 2021-07-23 DIAGNOSIS — Z7902 Long term (current) use of antithrombotics/antiplatelets: Secondary | ICD-10-CM | POA: Diagnosis not present

## 2021-07-23 DIAGNOSIS — R000 Tachycardia, unspecified: Secondary | ICD-10-CM

## 2021-07-23 DIAGNOSIS — I472 Ventricular tachycardia, unspecified: Secondary | ICD-10-CM | POA: Diagnosis not present

## 2021-07-23 DIAGNOSIS — Z993 Dependence on wheelchair: Secondary | ICD-10-CM

## 2021-07-23 DIAGNOSIS — I25119 Atherosclerotic heart disease of native coronary artery with unspecified angina pectoris: Secondary | ICD-10-CM

## 2021-07-23 DIAGNOSIS — I249 Acute ischemic heart disease, unspecified: Secondary | ICD-10-CM | POA: Diagnosis not present

## 2021-07-23 DIAGNOSIS — N179 Acute kidney failure, unspecified: Secondary | ICD-10-CM | POA: Diagnosis present

## 2021-07-23 DIAGNOSIS — D62 Acute posthemorrhagic anemia: Secondary | ICD-10-CM | POA: Diagnosis not present

## 2021-07-23 DIAGNOSIS — R778 Other specified abnormalities of plasma proteins: Secondary | ICD-10-CM | POA: Diagnosis not present

## 2021-07-23 DIAGNOSIS — R7989 Other specified abnormal findings of blood chemistry: Secondary | ICD-10-CM

## 2021-07-23 DIAGNOSIS — E1151 Type 2 diabetes mellitus with diabetic peripheral angiopathy without gangrene: Secondary | ICD-10-CM | POA: Diagnosis present

## 2021-07-23 DIAGNOSIS — Z823 Family history of stroke: Secondary | ICD-10-CM

## 2021-07-23 DIAGNOSIS — I252 Old myocardial infarction: Secondary | ICD-10-CM

## 2021-07-23 DIAGNOSIS — Z880 Allergy status to penicillin: Secondary | ICD-10-CM

## 2021-07-23 DIAGNOSIS — Z7984 Long term (current) use of oral hypoglycemic drugs: Secondary | ICD-10-CM

## 2021-07-23 DIAGNOSIS — R072 Precordial pain: Principal | ICD-10-CM

## 2021-07-23 DIAGNOSIS — Z91199 Patient's noncompliance with other medical treatment and regimen due to unspecified reason: Secondary | ICD-10-CM

## 2021-07-23 DIAGNOSIS — Z7982 Long term (current) use of aspirin: Secondary | ICD-10-CM | POA: Diagnosis not present

## 2021-07-23 DIAGNOSIS — Z6826 Body mass index (BMI) 26.0-26.9, adult: Secondary | ICD-10-CM

## 2021-07-23 DIAGNOSIS — E669 Obesity, unspecified: Secondary | ICD-10-CM | POA: Diagnosis present

## 2021-07-23 DIAGNOSIS — R531 Weakness: Secondary | ICD-10-CM

## 2021-07-23 DIAGNOSIS — I214 Non-ST elevation (NSTEMI) myocardial infarction: Secondary | ICD-10-CM | POA: Diagnosis not present

## 2021-07-23 DIAGNOSIS — Z8249 Family history of ischemic heart disease and other diseases of the circulatory system: Secondary | ICD-10-CM

## 2021-07-23 DIAGNOSIS — Z888 Allergy status to other drugs, medicaments and biological substances status: Secondary | ICD-10-CM

## 2021-07-23 DIAGNOSIS — E873 Alkalosis: Secondary | ICD-10-CM | POA: Diagnosis present

## 2021-07-23 DIAGNOSIS — I69351 Hemiplegia and hemiparesis following cerebral infarction affecting right dominant side: Secondary | ICD-10-CM | POA: Diagnosis not present

## 2021-07-23 DIAGNOSIS — R6520 Severe sepsis without septic shock: Secondary | ICD-10-CM | POA: Diagnosis present

## 2021-07-23 DIAGNOSIS — Z95811 Presence of heart assist device: Secondary | ICD-10-CM | POA: Diagnosis not present

## 2021-07-23 DIAGNOSIS — L089 Local infection of the skin and subcutaneous tissue, unspecified: Secondary | ICD-10-CM | POA: Diagnosis present

## 2021-07-23 LAB — CBC
HCT: 44.7 % (ref 39.0–52.0)
Hemoglobin: 15.3 g/dL (ref 13.0–17.0)
MCH: 29.5 pg (ref 26.0–34.0)
MCHC: 34.2 g/dL (ref 30.0–36.0)
MCV: 86.3 fL (ref 80.0–100.0)
Platelets: 306 10*3/uL (ref 150–400)
RBC: 5.18 MIL/uL (ref 4.22–5.81)
RDW: 11.7 % (ref 11.5–15.5)
WBC: 15.2 10*3/uL — ABNORMAL HIGH (ref 4.0–10.5)
nRBC: 0 % (ref 0.0–0.2)

## 2021-07-23 LAB — COMPREHENSIVE METABOLIC PANEL
ALT: 35 U/L (ref 0–44)
AST: 54 U/L — ABNORMAL HIGH (ref 15–41)
Albumin: 2.8 g/dL — ABNORMAL LOW (ref 3.5–5.0)
Alkaline Phosphatase: 81 U/L (ref 38–126)
Anion gap: 14 (ref 5–15)
BUN: 23 mg/dL — ABNORMAL HIGH (ref 6–20)
CO2: 18 mmol/L — ABNORMAL LOW (ref 22–32)
Calcium: 9.3 mg/dL (ref 8.9–10.3)
Chloride: 97 mmol/L — ABNORMAL LOW (ref 98–111)
Creatinine, Ser: 1.39 mg/dL — ABNORMAL HIGH (ref 0.61–1.24)
GFR, Estimated: >60 mL/min (ref >60)
Glucose, Bld: 529 mg/dL (ref 70–99)
Potassium: 5.1 mmol/L (ref 3.5–5.1)
Sodium: 129 mmol/L — ABNORMAL LOW (ref 135–145)
Total Bilirubin: 1.5 mg/dL — ABNORMAL HIGH (ref 0.3–1.2)
Total Protein: 7.3 g/dL (ref 6.5–8.1)

## 2021-07-23 LAB — I-STAT CHEM 8, ED
BUN: 31 mg/dL — ABNORMAL HIGH (ref 6–20)
Calcium, Ion: 0.95 mmol/L — ABNORMAL LOW (ref 1.15–1.40)
Chloride: 101 mmol/L (ref 98–111)
Creatinine, Ser: 1.2 mg/dL (ref 0.61–1.24)
Glucose, Bld: 535 mg/dL (ref 70–99)
HCT: 46 % (ref 39.0–52.0)
Hemoglobin: 15.6 g/dL (ref 13.0–17.0)
Potassium: 5.3 mmol/L — ABNORMAL HIGH (ref 3.5–5.1)
Sodium: 129 mmol/L — ABNORMAL LOW (ref 135–145)
TCO2: 20 mmol/L — ABNORMAL LOW (ref 22–32)

## 2021-07-23 LAB — TROPONIN I (HIGH SENSITIVITY): Troponin I (High Sensitivity): 2234 ng/L (ref <18)

## 2021-07-23 LAB — D-DIMER, QUANTITATIVE: D-Dimer, Quant: 2.08 ug/mL-FEU — ABNORMAL HIGH (ref 0.00–0.50)

## 2021-07-23 LAB — I-STAT CREATININE, ED: Creatinine, Ser: 1.2 mg/dL (ref 0.61–1.24)

## 2021-07-23 MED ORDER — PANTOPRAZOLE SODIUM 20 MG PO TBEC: 20.0000 mg | DELAYED_RELEASE_TABLET | Freq: Every day | ORAL | Status: DC

## 2021-07-23 MED ORDER — DOXYCYCLINE HYCLATE 100 MG PO CAPS: 100.0000 mg | ORAL_CAPSULE | Freq: Two times a day (BID) | ORAL | Status: DC

## 2021-07-23 MED ORDER — METOPROLOL TARTRATE 5 MG/5ML IV SOLN
5.0000 mg | Freq: Once | INTRAVENOUS | Status: AC
Start: 2021-07-23 — End: 2021-07-23

## 2021-07-23 MED ORDER — NITROGLYCERIN 0.4 MG SL SUBL
0.4000 mg | SUBLINGUAL_TABLET | SUBLINGUAL | Status: DC | PRN
  Administered 2021-07-23: 0.4 mg via SUBLINGUAL
  Filled 2021-07-23: qty 1

## 2021-07-23 MED ORDER — AMIODARONE HCL IN DEXTROSE 360-4.14 MG/200ML-% IV SOLN
60.0000 mg/h | INTRAVENOUS | Status: AC
  Administered 2021-07-23: 60 mg/h via INTRAVENOUS
  Filled 2021-07-23: qty 200

## 2021-07-23 MED ORDER — AMLODIPINE BESYLATE 10 MG PO TABS: 10.0000 mg | ORAL_TABLET | Freq: Every day | ORAL | Status: DC

## 2021-07-23 MED ORDER — EMPAGLIFLOZIN 10 MG PO TABS: 10.0000 mg | ORAL_TABLET | Freq: Every day | ORAL | Status: DC

## 2021-07-23 MED ORDER — TAMSULOSIN HCL 0.4 MG PO CAPS
0.4000 mg | ORAL_CAPSULE | Freq: Every day | ORAL | Status: DC
  Filled 2021-07-23: qty 1

## 2021-07-23 MED ORDER — LORAZEPAM 2 MG/ML IJ SOLN
1.0000 mg | Freq: Once | INTRAMUSCULAR | Status: AC
  Administered 2021-07-23: 1 mg via INTRAVENOUS
  Filled 2021-07-23: qty 1

## 2021-07-23 MED ORDER — CARVEDILOL 25 MG PO TABS: 25.0000 mg | ORAL_TABLET | Freq: Two times a day (BID) | ORAL | Status: DC

## 2021-07-23 MED ORDER — ASPIRIN 81 MG PO TBEC: 81.0000 mg | DELAYED_RELEASE_TABLET | Freq: Every day | ORAL | Status: DC

## 2021-07-23 MED ORDER — ATORVASTATIN CALCIUM 80 MG PO TABS
80.0000 mg | ORAL_TABLET | Freq: Every day | ORAL | Status: DC
  Filled 2021-07-23: qty 1

## 2021-07-23 MED ORDER — LOSARTAN POTASSIUM 50 MG PO TABS
100.0000 mg | ORAL_TABLET | Freq: Every evening | ORAL | Status: DC
  Administered 2021-07-23: 100 mg via ORAL
  Filled 2021-07-23: qty 2

## 2021-07-23 MED ORDER — ISOSORBIDE MONONITRATE ER 30 MG PO TB24: 30.0000 mg | ORAL_TABLET | Freq: Every day | ORAL | Status: DC

## 2021-07-23 MED ORDER — ASPIRIN 81 MG PO CHEW
324.0000 mg | CHEWABLE_TABLET | Freq: Once | ORAL | Status: AC
  Administered 2021-07-23: 324 mg via ORAL
  Filled 2021-07-23: qty 4

## 2021-07-23 MED ORDER — INSULIN GLARGINE-YFGN 100 UNIT/ML ~~LOC~~ SOLN: 40.0000 [IU] | Freq: Every day | SUBCUTANEOUS | Status: DC

## 2021-07-23 MED ORDER — AMIODARONE HCL IN DEXTROSE 360-4.14 MG/200ML-% IV SOLN
30.0000 mg/h | INTRAVENOUS | Status: DC
  Filled 2021-07-23: qty 200

## 2021-07-23 MED ORDER — METOCLOPRAMIDE HCL 10 MG PO TABS
10.0000 mg | ORAL_TABLET | Freq: Two times a day (BID) | ORAL | Status: DC | PRN
  Filled 2021-07-23: qty 1

## 2021-07-23 MED ORDER — METOPROLOL TARTRATE 5 MG/5ML IV SOLN: 5.0000 mg | INTRAVENOUS | Status: DC | PRN

## 2021-07-23 MED ORDER — SODIUM CHLORIDE 0.9 % IV BOLUS
1000.0000 mL | Freq: Once | INTRAVENOUS | Status: AC
  Administered 2021-07-23: 1000 mL via INTRAVENOUS

## 2021-07-23 MED ORDER — METOPROLOL TARTRATE 5 MG/5ML IV SOLN
INTRAVENOUS | Status: AC
  Administered 2021-07-23: 5 mg via INTRAVENOUS
  Filled 2021-07-23: qty 5

## 2021-07-23 MED ORDER — ASPIRIN 300 MG RE SUPP: 300.0000 mg | RECTAL | Status: AC

## 2021-07-23 MED ORDER — NITROGLYCERIN 0.4 MG SL SUBL: 0.4000 mg | SUBLINGUAL_TABLET | SUBLINGUAL | Status: DC | PRN

## 2021-07-23 MED ORDER — NOVOLOG 100 UNIT/ML ~~LOC~~ SOLN: 5.0000 [IU] | Freq: Three times a day (TID) | SUBCUTANEOUS | Status: DC

## 2021-07-23 MED ORDER — BUPROPION HCL ER (XL) 150 MG PO TB24
150.0000 mg | ORAL_TABLET | Freq: Every day | ORAL | Status: DC
  Filled 2021-07-23: qty 1

## 2021-07-23 MED ORDER — ASPIRIN EC 81 MG PO TBEC
81.0000 mg | DELAYED_RELEASE_TABLET | Freq: Every day | ORAL | Status: DC
  Administered 2021-07-24: 81 mg via ORAL
  Filled 2021-07-23: qty 1

## 2021-07-23 MED ORDER — SODIUM CHLORIDE 0.9 % IV SOLN
1.0000 g | Freq: Three times a day (TID) | INTRAVENOUS | Status: DC
  Administered 2021-07-24 – 2021-07-30 (×19): 1 g via INTRAVENOUS
  Filled 2021-07-23 (×25): qty 1

## 2021-07-23 MED ORDER — VANCOMYCIN HCL 1500 MG/300ML IV SOLN
1500.0000 mg | INTRAVENOUS | Status: DC
  Administered 2021-07-24 – 2021-07-29 (×7): 1500 mg via INTRAVENOUS
  Filled 2021-07-23 (×8): qty 300

## 2021-07-23 MED ORDER — ADENOSINE 6 MG/2ML IV SOLN
6.0000 mg | Freq: Once | INTRAVENOUS | Status: DC
Start: 2021-07-23 — End: 2021-07-23
  Filled 2021-07-23: qty 2

## 2021-07-23 MED ORDER — ACETAMINOPHEN 325 MG PO TABS
650.0000 mg | ORAL_TABLET | ORAL | Status: DC | PRN
  Administered 2021-07-23: 650 mg via ORAL
  Filled 2021-07-23: qty 2

## 2021-07-23 MED ORDER — SIMETHICONE 80 MG PO CHEW
160.0000 mg | CHEWABLE_TABLET | Freq: Four times a day (QID) | ORAL | Status: DC | PRN
  Administered 2021-07-23: 160 mg via ORAL
  Filled 2021-07-23: qty 2

## 2021-07-23 MED ORDER — ADENOSINE 6 MG/2ML IV SOLN
INTRAVENOUS | Status: AC
  Filled 2021-07-23: qty 2

## 2021-07-23 MED ORDER — ONDANSETRON HCL 4 MG/2ML IJ SOLN: 4.0000 mg | Freq: Four times a day (QID) | INTRAMUSCULAR | Status: DC | PRN

## 2021-07-23 MED ORDER — FOLIC ACID 1 MG PO TABS
1.0000 mg | ORAL_TABLET | Freq: Every day | ORAL | Status: DC
  Filled 2021-07-23: qty 1

## 2021-07-23 MED ORDER — HEPARIN (PORCINE) 25000 UT/250ML-% IV SOLN
800.0000 [IU]/h | INTRAVENOUS | Status: DC
Start: 2021-07-23 — End: 2021-07-24
  Administered 2021-07-23 (×2): 1000 [IU]/h via INTRAVENOUS
  Filled 2021-07-23 (×2): qty 250

## 2021-07-23 MED ORDER — AMIODARONE LOAD VIA INFUSION
150.0000 mg | Freq: Once | INTRAVENOUS | Status: AC
  Administered 2021-07-23: 150 mg via INTRAVENOUS
  Filled 2021-07-23: qty 83.34

## 2021-07-23 MED ORDER — ADULT MULTIVITAMIN W/MINERALS CH
1.0000 | ORAL_TABLET | Freq: Every day | ORAL | Status: DC
  Filled 2021-07-23 (×2): qty 1

## 2021-07-23 MED ORDER — TICAGRELOR 90 MG PO TABS
180.0000 mg | ORAL_TABLET | Freq: Once | ORAL | Status: AC
  Administered 2021-07-23: 180 mg via ORAL
  Filled 2021-07-23: qty 2

## 2021-07-23 MED ORDER — CITALOPRAM HYDROBROMIDE 20 MG PO TABS: 40.0000 mg | ORAL_TABLET | Freq: Every day | ORAL | Status: DC

## 2021-07-23 MED ORDER — HEPARIN BOLUS VIA INFUSION
4000.0000 [IU] | Freq: Once | INTRAVENOUS | Status: AC
  Administered 2021-07-23: 4000 [IU] via INTRAVENOUS
  Filled 2021-07-23: qty 4000

## 2021-07-23 MED ORDER — INSULIN ASPART 100 UNIT/ML IJ SOLN
12.0000 [IU] | Freq: Once | INTRAMUSCULAR | Status: AC
  Administered 2021-07-23: 12 [IU] via SUBCUTANEOUS

## 2021-07-23 MED ORDER — ASPIRIN 81 MG PO CHEW
324.0000 mg | CHEWABLE_TABLET | ORAL | Status: AC
  Filled 2021-07-23: qty 4

## 2021-07-23 MED ORDER — ALUM & MAG HYDROXIDE-SIMETH 200-200-20 MG/5ML PO SUSP: 30.0000 mL | Freq: Four times a day (QID) | ORAL | Status: DC | PRN

## 2021-07-23 MED ORDER — ADENOSINE 6 MG/2ML IV SOLN
INTRAVENOUS | Status: AC | PRN
Start: 2021-07-23 — End: 2021-07-23
  Administered 2021-07-23: 6 mg via INTRAVENOUS

## 2021-07-23 MED ORDER — CALCIUM GLUCONATE 10 % IV SOLN
1.0000 g | Freq: Once | INTRAVENOUS | Status: AC
  Administered 2021-07-23: 1 g via INTRAVENOUS
  Filled 2021-07-23: qty 10

## 2021-07-23 MED ORDER — NITROGLYCERIN IN D5W 200-5 MCG/ML-% IV SOLN
0.0000 ug/min | INTRAVENOUS | Status: DC
  Administered 2021-07-23: 5 ug/min via INTRAVENOUS
  Administered 2021-07-23: 10 ug/min via INTRAVENOUS
  Filled 2021-07-23 (×2): qty 250

## 2021-07-23 NOTE — Progress Notes (Signed)
ANTICOAGULATION CONSULT NOTE - Initial Consult ? ?Pharmacy Consult for heparin ?Indication: chest pain/ACS ? ?Allergies  ?Allergen Reactions  ? Penicillins Other (See Comments) and Anaphylaxis  ?  Has patient had a PCN reaction causing immediate rash, facial/tongue/throat swelling, SOB or lightheadedness with hypotension: Yes ?Has patient had a PCN reaction causing severe rash involving mucus membranes or skin necrosis: No ?Has patient had a PCN reaction that required hospitalization: No ?Has patient had a PCN reaction occurring within the last 10 years: No ?If all of the above answers are "NO", then may proceed with Cephalosporin use. ?  ? Empagliflozin   ?  Other reaction(s): Urinary tract infectious disease  ? Liraglutide Nausea And Vomiting  ? Penicillin G Rash and Hives  ? ? ?Patient Measurements: ?Height: 5\' 9"  (175.3 cm) ?Weight: 81.6 kg (180 lb) ?IBW/kg (Calculated) : 70.7 ?Heparin Dosing Weight: TBW ? ?Vital Signs: ?BP: 124/98 (04/01 2100) ?Pulse Rate: 118 (04/01 2100) ? ?Labs: ?Recent Labs  ?  07/26/2021 ?2029 08/18/2021 ?2030  ?HGB 15.6 15.3  ?HCT 46.0 44.7  ?PLT  --  306  ?CREATININE 1.20  1.20 1.39*  ?TROPONINIHS  --  2,234*  ? ? ?Estimated Creatinine Clearance: 64.3 mL/min (A) (by C-G formula based on SCr of 1.39 mg/dL (H)). ? ? ?Medical History: ?Past Medical History:  ?Diagnosis Date  ? Diabetes mellitus without complication ([place])   ? Hypertension   ? Stroke [facility])   ? ? ?Assessment: ?26 YOM presenting with CP, SOB over last few days, elevated troponin.  He is not on anticoagulation PTA, CBC wnl ? ?Goal of Therapy:  ?Heparin level 0.3-0.7 units/ml ?Monitor platelets by anticoagulation protocol: Yes ?  ?Plan:  ?Heparin 4000 units IV x 1, and gtt at 1000 units/hr ?F/u 6 hour heparin level ?F/u cards eval and recs ? ?Bertis Ruddy, PharmD ?Clinical Pharmacist ?ED Pharmacist Phone # [phone] ?07/27/2021 9:23 PM ? ? ? ?

## 2021-07-23 NOTE — Progress Notes (Signed)
Pharmacy Antibiotic Note ? ?[name] [name] is a 51 y.o. male admitted on 08/10/21 with  diabetic foot ulcer .  Pharmacy has been consulted for Vancomycin and Aztreonam dosing. Appears pt may have started doxycycline for this a few days ago. ? ?Pt with anaphylaxis to PCN - cannot tell where he has ever tolerated cephalosporin and QTc too high for FLQ so will utilize aztreonam for now. ? ?SCr up to 1.39 (baseline ~1) ? ?Plan: ?Aztreonam 1gm IV q8h ?Vancomycin 1500 mg IV Q 24 hrs. Goal AUC 400-550. Expected AUC: 440 SCr used: 1.39 ?Will f/u renal function, micro data, and pt's clinical condition ?Vanc levels prn ? ? ?Height: 5\' 9"  (175.3 cm) ?Weight: 81.6 kg (180 lb) ?IBW/kg (Calculated) : 70.7 ? ?No data recorded. ? ?Recent Labs  ?Lab 07/19/21 ?2310 08/10/21 ?2029 2021/08/10 ?2030  ?WBC 11.5*  --  15.2*  ?CREATININE 1.00 1.20  1.20 1.39*  ?  ?Estimated Creatinine Clearance: 64.3 mL/min (A) (by C-G formula based on SCr of 1.39 mg/dL (H)).   ? ?Allergies  ?Allergen Reactions  ? Penicillins Other (See Comments) and Anaphylaxis  ?  Has patient had a PCN reaction causing immediate rash, facial/tongue/throat swelling, SOB or lightheadedness with hypotension: Yes ?Has patient had a PCN reaction causing severe rash involving mucus membranes or skin necrosis: No ?Has patient had a PCN reaction that required hospitalization: No ?Has patient had a PCN reaction occurring within the last 10 years: No ?If all of the above answers are "NO", then may proceed with Cephalosporin use. ?  ? Empagliflozin   ?  Other reaction(s): Urinary tract infectious disease  ? Liraglutide Nausea And Vomiting  ? Penicillin G Rash and Hives  ? ? ?Antimicrobials this admission: ?4/1 Zosyn >>  ?4/1 Vanc >>  ? ?Microbiology results: ?Pending ? ?Thank you for allowing pharmacy to be a part of this patient?s care. ? ?6/1, PharmD, BCPS ?Please see amion for complete clinical pharmacist phone list ?08-10-2021 11:00 PM ? ?

## 2021-07-23 NOTE — H&P (Signed)
?Cardiology Admission History and Physical:  ? ?Patient ID: [name] [name] ?MRN: [id number]; DOB: [birth year]  ? ?Admission date: 08/09/2021 ? ?Primary Care Provider: [place] ?Primary Cardiologist: None  ?Primary Electrophysiologist:  None ? ?Chief Complaint:  chest pain ? ?Patient Profile:  ? ?[name] [name] is a 51 y.o. male with history of type 2 diabetes, poorly controlled, hypertension, coronary disease with prior late presentation STEMI in October 2021 ? ?History of Present Illness:  ? ?[name] [name]  type 2 diabetes, poorly controlled, hypertension, coronary disease with prior late presentation STEMI in October 2021 with angioplasty without stent to the distal LAD with residual moderate disease in the LAD presenting with severe chest pain.  Patient reports he was seen about diabetic foot ulcer on his right toe 3 days ago and was started on doxycycline.  He says within that day he started having chest discomfort which has progressively worsened over the last few days.  He says it is similar to the chest pain he had when his prior MI. ? ?On evaluation in the ED patient states that chest pain has been severe for several days and currently rates it 10 out of 10 nonradiating.  Did not respond much to his nitroglycerin when obtained initially in the ED.  Does state he has been taking his Plavix.  Given symptoms and after troponin returned at 2200 work to cool down after discussion with interventional including heparin, nitroglycerin drip, metoprolol IV.  Initially did receive adenosine to confirm not flutter with aberration followed by amiodarone for concern for VT. ? ? ?Past Medical History:  ?Diagnosis Date  ? Diabetes mellitus without complication ([place])   ? Hypertension   ? Stroke [facility])   ? ? ?Past Surgical History:  ?Procedure Laterality Date  ? APPENDECTOMY    ? BACK SURGERY    ? CORONARY/GRAFT ACUTE MI REVASCULARIZATION N/A 01/23/2020  ? Procedure: Coronary/Graft Acute MI Revascularization;  Surgeon:  Yolonda Kida, MD;  Location: [place] [facility];  Service: Cardiovascular;  Laterality: N/A;  ? LEFT HEART CATH AND CORONARY ANGIOGRAPHY N/A 01/23/2020  ? Procedure: LEFT HEART CATH AND CORONARY ANGIOGRAPHY;  Surgeon: Yolonda Kida, MD;  Location: [place] [facility];  Service: Cardiovascular;  Laterality: N/A;  ?  ? ?Medications Prior to Admission: ?Prior to Admission medications   ?Medication Sig Start Date End Date Taking? Authorizing Provider  ?alum & mag hydroxide-simeth (MAALOX/MYLANTA) 200-200-20 MG/5ML suspension Take 30 mLs by mouth every 6 (six) hours as needed for indigestion or heartburn. 01/17/21   Pokhrel, Corrie Mckusick, MD  ?amLODipine (NORVASC) 10 MG tablet Take 1 tablet by mouth daily. 12/10/20   [provider]  ?aspirin 81 MG EC tablet Take 81 mg by mouth daily. 10/18/20   [provider]  ?atorvastatin (LIPITOR) 80 MG tablet Take 80 mg by mouth daily.    [provider]  ?buPROPion (WELLBUTRIN XL) 150 MG 24 hr tablet Take 150 mg by mouth daily.    [provider]  ?carvedilol (COREG) 25 MG tablet Take 25 mg by mouth in the morning and at bedtime. 07/08/20   [provider]  ?chlorthalidone (HYGROTON) 25 MG tablet Take 25 mg by mouth daily. 12/07/20   [provider]  ?citalopram (CELEXA) 40 MG tablet Take 40 mg by mouth daily.    [provider]  ?clopidogrel (PLAVIX) 75 MG tablet Take 75 mg by mouth daily. 10/18/20   [provider]  ?dicyclomine (BENTYL) 10 MG capsule Take 1 capsule (  10 mg total) by mouth 4 (four) times daily -  before meals and at bedtime for 5 days. 01/17/21 01/22/21  Pokhrel, Corrie Mckusick, MD  ?doxycycline (VIBRAMYCIN) 100 MG capsule Take 1 capsule (100 mg total) by mouth 2 (two) times daily. 07/20/21   Petrucelli, Glynda Jaeger, PA-C  ?Empagliflozin (JARDIANCE PO) Take by mouth. Pt reports taking 1/2 pill per day; not sure of dose    [provider]  ?folic acid (FOLVITE) 1 MG tablet Take 1 mg by mouth  daily.    [provider]  ?insulin aspart (NOVOLOG) 100 UNIT/ML injection Inject 5 Units into the skin 3 (three) times daily with meals. ?Patient taking differently: Inject 10 Units into the skin 3 (three) times daily with meals. 01/25/20 02/19/21  Enzo Bi, MD  ?insulin glargine (LANTUS) 100 UNIT/ML injection Inject 40 Units into the skin at bedtime.    [provider]  ?isosorbide mononitrate (IMDUR) 30 MG 24 hr tablet Take 1 tablet (30 mg total) by mouth daily. 01/18/21   Pokhrel, Corrie Mckusick, MD  ?losartan (COZAAR) 100 MG tablet Take 100 mg by mouth every evening.    [provider]  ?metoCLOPramide (REGLAN) 10 MG tablet Take 1 tablet (10 mg total) by mouth every 12 (twelve) hours as needed for nausea. 01/02/21   Henderly, Britni A, PA-C  ?Multiple Vitamins-Minerals (MULTIVITAMIN ADULT) TABS Take 1 tablet by mouth daily.    [provider]  ?pantoprazole (PROTONIX) 20 MG tablet Take 1 tablet (20 mg total) by mouth daily. 01/02/21   Henderly, Britni A, PA-C  ?simethicone (MYLICON) 80 MG chewable tablet Chew 2 tablets (160 mg total) by mouth 4 (four) times daily as needed for flatulence. 01/17/21   Pokhrel, Corrie Mckusick, MD  ?tamsulosin (FLOMAX) 0.4 MG CAPS capsule Take 0.4 mg by mouth daily.    [provider]  ?  ? ?Allergies:    ?Allergies  ?Allergen Reactions  ? Penicillins Other (See Comments) and Anaphylaxis  ?  Has patient had a PCN reaction causing immediate rash, facial/tongue/throat swelling, SOB or lightheadedness with hypotension: Yes ?Has patient had a PCN reaction causing severe rash involving mucus membranes or skin necrosis: No ?Has patient had a PCN reaction that required hospitalization: No ?Has patient had a PCN reaction occurring within the last 10 years: No ?If all of the above answers are "NO", then may proceed with Cephalosporin use. ?  ? Empagliflozin   ?  Other reaction(s): Urinary tract infectious disease  ? Liraglutide Nausea And Vomiting  ? Penicillin G  Rash and Hives  ? ? ?Social History:   ?Social History  ? ?Socioeconomic History  ? Marital status: Divorced  ?  Spouse name: Not on file  ? Number of children: Not on file  ? Years of education: Not on file  ? Highest education level: Not on file  ?Occupational History  ? Not on file  ?Tobacco Use  ? Smoking status: Every Day  ?  Packs/day: 0.25  ?  Types: Cigarettes  ? Smokeless tobacco: Never  ?Vaping Use  ? Vaping Use: Never used  ?Substance and Sexual Activity  ? Alcohol use: No  ? Drug use: No  ? Sexual activity: Not on file  ?Other Topics Concern  ? Not on file  ?Social History Narrative  ? Not on file  ? ?Social Determinants of Health  ? ?Financial Resource Strain: Not on file  ?Food Insecurity: Not on file  ?Transportation Needs: Not on file  ?Physical Activity: Not on file  ?  Stress: Not on file  ?Social Connections: Not on file  ?Intimate Partner Violence: Not on file  ?  ?Family History:   ?The patient's family history includes Diabetes in his father; Heart failure in his father and mother; Stroke in his mother.   ? ? ?Review of Systems: [y] = yes, [ ]  = no  ? ?General: Weight gain [ ] ; Weight loss [ ] ; Anorexia [ ] ; Fatigue [ ] ; Fever [ ] ; Chills [ ] ; Weakness [ ]   ?Cardiac: Chest pain/pressure [ ] ; Resting SOB [ ] ; Exertional SOB [ ] ; Orthopnea [ ] ; Pedal Edema [ ] ; Palpitations [ ] ; Syncope [ ] ; Presyncope [ ] ; Paroxysmal nocturnal dyspnea[ ]   ?Pulmonary: Cough [ ] ; Wheezing[ ] ; Hemoptysis[ ] ; Sputum [ ] ; Snoring [ ]   ?GI: Vomiting[ ] ; Dysphagia[ ] ; Melena[ ] ; Hematochezia [ ] ; Heartburn[ ] ; Abdominal pain [ ] ; Constipation [ ] ; Diarrhea [ ] ; BRBPR [ ]   ?GU: Hematuria[ ] ; Dysuria [ ] ; Nocturia[ ]   ?Vascular: Pain in legs with walking [ ] ; Pain in feet with lying flat [ ] ; Non-healing sores [ ] ; Stroke [ ] ; TIA [ ] ; Slurred speech [ ] ;  ?Neuro: Headaches[ ] ; Vertigo[ ] ; Seizures[ ] ; Paresthesias[ ] ;Blurred vision [ ] ; Diplopia [ ] ; Vision changes [ ]   ?Ortho/Skin: Arthritis [ ] ; Joint pain [ ] ; Muscle  pain [ ] ; Joint swelling [ ] ; Back Pain [ ] ; Rash [ ]   ?Psych: Depression[ ] ; Anxiety[ ]   ?Heme: Bleeding problems [ ] ; Clotting disorders [ ] ; Anemia [ ]   ?Endocrine: Diabetes [ ] ; Thyroid dysfunction[ ]

## 2021-07-23 NOTE — ED Provider Notes (Signed)
?[facility] EMERGENCY DEPARTMENT ?Provider Note ? ? ?CSN: [id number] ?Arrival date & time: 08/21/2021  1957 ? ?  ? ?History ? ?Chief Complaint  ?Patient presents with  ? Chest Pain  ? ? ?[name] [name] is a 51 y.o. male. ? ?Patient with hx cad, dm, c/o chest discomfort, sob, in past 3-4 days. States symptoms started after being placed on doxycycline for diabetic foot ulcer 4 days ago. Denies other new meds or change in meds. Denies palpitations. Denies hx afib, svt or vtach. Denies exertional cp. Symptoms at rest, constant. Denies pleuritic pain. No cough or uri  symptoms. No fever or chills. No abd pain. +intermittent nv. No diarrhea. No dysuria or gu c/o. No swelling.  ? ?The history is provided by the patient, medical records and the EMS personnel.  ?Chest Pain ?Associated symptoms: no abdominal pain, no back pain, no fever, no headache, no palpitations and no shortness of breath   ? ?  ? ?Home Medications ?Prior to Admission medications   ?Medication Sig Start Date End Date Taking? Authorizing Provider  ?alum & mag hydroxide-simeth (MAALOX/MYLANTA) 200-200-20 MG/5ML suspension Take 30 mLs by mouth every 6 (six) hours as needed for indigestion or heartburn. 01/17/21   Pokhrel, Rebekah Chesterfield, MD  ?amLODipine (NORVASC) 10 MG tablet Take 1 tablet by mouth daily. 12/10/20   [provider]  ?aspirin 81 MG EC tablet Take 81 mg by mouth daily. 10/18/20   [provider]  ?atorvastatin (LIPITOR) 80 MG tablet Take 80 mg by mouth daily.    [provider]  ?buPROPion (WELLBUTRIN XL) 150 MG 24 hr tablet Take 150 mg by mouth daily.    [provider]  ?carvedilol (COREG) 25 MG tablet Take 25 mg by mouth in the morning and at bedtime. 07/08/20   [provider]  ?chlorthalidone (HYGROTON) 25 MG tablet Take 25 mg by mouth daily. 12/07/20   [provider]  ?citalopram (CELEXA) 40 MG tablet Take 40 mg by mouth daily.    [provider]  ?clopidogrel (PLAVIX) 75  MG tablet Take 75 mg by mouth daily. 10/18/20   [provider]  ?dicyclomine (BENTYL) 10 MG capsule Take 1 capsule (10 mg total) by mouth 4 (four) times daily -  before meals and at bedtime for 5 days. 01/17/21 01/22/21  Pokhrel, Rebekah Chesterfield, MD  ?doxycycline (VIBRAMYCIN) 100 MG capsule Take 1 capsule (100 mg total) by mouth 2 (two) times daily. 07/20/21   Petrucelli, Pleas Koch, PA-C  ?Empagliflozin (JARDIANCE PO) Take by mouth. Pt reports taking 1/2 pill per day; not sure of dose    [provider]  ?folic acid (FOLVITE) 1 MG tablet Take 1 mg by mouth daily.    [provider]  ?insulin aspart (NOVOLOG) 100 UNIT/ML injection Inject 5 Units into the skin 3 (three) times daily with meals. ?Patient taking differently: Inject 10 Units into the skin 3 (three) times daily with meals. 01/25/20 02/19/21  Darlin Priestly, MD  ?insulin glargine (LANTUS) 100 UNIT/ML injection Inject 40 Units into the skin at bedtime.    [provider]  ?isosorbide mononitrate (IMDUR) 30 MG 24 hr tablet Take 1 tablet (30 mg total) by mouth daily. 01/18/21   Pokhrel, Rebekah Chesterfield, MD  ?losartan (COZAAR) 100 MG tablet Take 100 mg by mouth every evening.    [provider]  ?metoCLOPramide (REGLAN) 10 MG tablet Take 1 tablet (10 mg total) by mouth every 12 (twelve) hours as needed for nausea. 01/02/21   Henderly,  Britni A, PA-C  ?Multiple Vitamins-Minerals (MULTIVITAMIN ADULT) TABS Take 1 tablet by mouth daily.    [provider]  ?pantoprazole (PROTONIX) 20 MG tablet Take 1 tablet (20 mg total) by mouth daily. 01/02/21   Henderly, Britni A, PA-C  ?simethicone (MYLICON) 80 MG chewable tablet Chew 2 tablets (160 mg total) by mouth 4 (four) times daily as needed for flatulence. 01/17/21   Pokhrel, Rebekah Chesterfield, MD  ?tamsulosin (FLOMAX) 0.4 MG CAPS capsule Take 0.4 mg by mouth daily.    [provider]  ?   ? ?Allergies    ?Penicillins, Empagliflozin, Liraglutide, and Penicillin g   ? ?Review of Systems   ?Review of  Systems  ?Constitutional:  Negative for fever.  ?HENT:  Negative for sore throat.   ?Eyes:  Negative for redness.  ?Respiratory:  Negative for shortness of breath.   ?Cardiovascular:  Positive for chest pain. Negative for palpitations and leg swelling.  ?Gastrointestinal:  Negative for abdominal pain.  ?Genitourinary:  Negative for dysuria and flank pain.  ?Musculoskeletal:  Negative for back pain and neck pain.  ?Skin:  Negative for rash.  ?Neurological:  Negative for syncope and headaches.  ?Hematological:  Does not bruise/bleed easily.  ?Psychiatric/Behavioral:  Negative for confusion.   ? ?Physical Exam ?Updated Vital Signs ?BP 105/80 (BP Location: Left Arm)   Pulse (!) 126   Resp 18   Ht 1.753 m (5\' 9" )   Wt 81.6 kg   SpO2 98%   BMI 26.58 kg/m?  ?Physical Exam ?Vitals and nursing note reviewed.  ?Constitutional:   ?   Appearance: Normal appearance. He is well-developed.  ?HENT:  ?   Head: Atraumatic.  ?   Nose: Nose normal.  ?   Mouth/Throat:  ?   Mouth: Mucous membranes are moist.  ?Eyes:  ?   General: No scleral icterus. ?   Conjunctiva/sclera: Conjunctivae normal.  ?Neck:  ?   Trachea: No tracheal deviation.  ?   Comments: Thyroid not grossly enlarged or tender.  ?Cardiovascular:  ?   Rate and Rhythm: Normal rate and regular rhythm.  ?   Pulses: Normal pulses.  ?   Heart sounds: Normal heart sounds. No murmur heard. ?  No friction rub. No gallop.  ?Pulmonary:  ?   Effort: Pulmonary effort is normal. No accessory muscle usage or respiratory distress.  ?   Breath sounds: Normal breath sounds.  ?Abdominal:  ?   General: Bowel sounds are normal. There is no distension.  ?   Palpations: Abdomen is soft.  ?   Tenderness: There is no abdominal tenderness. There is no guarding.  ?Genitourinary: ?   Comments: No cva tenderness. ?Musculoskeletal:     ?   General: No swelling or tenderness.  ?   Cervical back: Normal range of motion and neck supple. No rigidity.  ?   Right lower leg: No edema.  ?   Left lower  leg: No edema.  ?   Comments: Gangrenous discoloration to plantar aspect right great toe.   ?Skin: ?   General: Skin is warm and dry.  ?   Findings: No rash.  ?Neurological:  ?   Mental Status: He is alert.  ?   Comments: Alert, speech clear. Motor/sens grossly intact bil.   ?Psychiatric:     ?   Mood and Affect: Mood normal.  ? ? ?ED Results / Procedures / Treatments   ?Labs ?(all labs ordered are listed, but only abnormal results are displayed) ?Results for orders  placed or performed during the hospital encounter of 08/21/2021  ?CBC  ?Result Value Ref Range  ? WBC 15.2 (H) 4.0 - 10.5 K/uL  ? RBC 5.18 4.22 - 5.81 MIL/uL  ? Hemoglobin 15.3 13.0 - 17.0 g/dL  ? HCT 44.7 39.0 - 52.0 %  ? MCV 86.3 80.0 - 100.0 fL  ? MCH 29.5 26.0 - 34.0 pg  ? MCHC 34.2 30.0 - 36.0 g/dL  ? RDW 11.7 11.5 - 15.5 %  ? Platelets 306 150 - 400 K/uL  ? nRBC 0.0 0.0 - 0.2 %  ?Comprehensive metabolic panel  ?Result Value Ref Range  ? Sodium 129 (L) 135 - 145 mmol/L  ? Potassium 5.1 3.5 - 5.1 mmol/L  ? Chloride 97 (L) 98 - 111 mmol/L  ? CO2 18 (L) 22 - 32 mmol/L  ? Glucose, Bld 529 (HH) 70 - 99 mg/dL  ? BUN 23 (H) 6 - 20 mg/dL  ? Creatinine, Ser 1.39 (H) 0.61 - 1.24 mg/dL  ? Calcium 9.3 8.9 - 10.3 mg/dL  ? Total Protein 7.3 6.5 - 8.1 g/dL  ? Albumin 2.8 (L) 3.5 - 5.0 g/dL  ? AST 54 (H) 15 - 41 U/L  ? ALT 35 0 - 44 U/L  ? Alkaline Phosphatase 81 38 - 126 U/L  ? Total Bilirubin 1.5 (H) 0.3 - 1.2 mg/dL  ? GFR, Estimated >60 >60 mL/min  ? Anion gap 14 5 - 15  ?D-dimer, quantitative  ?Result Value Ref Range  ? D-Dimer, Quant 2.08 (H) 0.00 - 0.50 ug/mL-FEU  ?I-stat chem 8, ED  ?Result Value Ref Range  ? Sodium 129 (L) 135 - 145 mmol/L  ? Potassium 5.3 (H) 3.5 - 5.1 mmol/L  ? Chloride 101 98 - 111 mmol/L  ? BUN 31 (H) 6 - 20 mg/dL  ? Creatinine, Ser 1.20 0.61 - 1.24 mg/dL  ? Glucose, Bld 535 (HH) 70 - 99 mg/dL  ? Calcium, Ion 0.95 (L) 1.15 - 1.40 mmol/L  ? TCO2 20 (L) 22 - 32 mmol/L  ? Hemoglobin 15.6 13.0 - 17.0 g/dL  ? HCT 46.0 39.0 - 52.0 %  ? Comment  NOTIFIED PHYSICIAN   ?I-stat Creatinine, ED  ?Result Value Ref Range  ? Creatinine, Ser 1.20 0.61 - 1.24 mg/dL  ?Troponin I (High Sensitivity)  ?Result Value Ref Range  ? Troponin I (High Sensitivity) 2,234 (H

## 2021-07-23 NOTE — ED Triage Notes (Signed)
Pt BIB EMS for CP. Patient told EMS that he didn't take his blood pressure or heart medication because, "I didn't feel well enough to take it." Upon EMS arrival to scene pt's O2 was 85, pt was then put on 2L and jumped to 98%. Pt nauseated with EMS 4mg  of Zofran was given.  ?

## 2021-07-24 ENCOUNTER — Inpatient Hospital Stay (HOSPITAL_COMMUNITY): Payer: No Typology Code available for payment source

## 2021-07-24 ENCOUNTER — Inpatient Hospital Stay: Payer: Self-pay

## 2021-07-24 ENCOUNTER — Encounter (HOSPITAL_COMMUNITY): Admission: EM | Disposition: E | Payer: Self-pay | Source: Home / Self Care | Attending: Cardiology

## 2021-07-24 DIAGNOSIS — I5021 Acute systolic (congestive) heart failure: Secondary | ICD-10-CM

## 2021-07-24 DIAGNOSIS — I5023 Acute on chronic systolic (congestive) heart failure: Secondary | ICD-10-CM

## 2021-07-24 DIAGNOSIS — R570 Cardiogenic shock: Secondary | ICD-10-CM

## 2021-07-24 DIAGNOSIS — I214 Non-ST elevation (NSTEMI) myocardial infarction: Secondary | ICD-10-CM

## 2021-07-24 DIAGNOSIS — R778 Other specified abnormalities of plasma proteins: Secondary | ICD-10-CM

## 2021-07-24 DIAGNOSIS — E1165 Type 2 diabetes mellitus with hyperglycemia: Secondary | ICD-10-CM

## 2021-07-24 DIAGNOSIS — I249 Acute ischemic heart disease, unspecified: Secondary | ICD-10-CM | POA: Diagnosis not present

## 2021-07-24 DIAGNOSIS — I25119 Atherosclerotic heart disease of native coronary artery with unspecified angina pectoris: Secondary | ICD-10-CM

## 2021-07-24 HISTORY — PX: RIGHT/LEFT HEART CATH AND CORONARY ANGIOGRAPHY: CATH118266

## 2021-07-24 HISTORY — PX: CORONARY STENT INTERVENTION: CATH118234

## 2021-07-24 LAB — BASIC METABOLIC PANEL
Anion gap: 11 (ref 5–15)
Anion gap: 12 (ref 5–15)
BUN: 26 mg/dL — ABNORMAL HIGH (ref 6–20)
BUN: 26 mg/dL — ABNORMAL HIGH (ref 6–20)
CO2: 24 mmol/L (ref 22–32)
CO2: 22 mmol/L (ref 22–32)
Calcium: 9.9 mg/dL (ref 8.9–10.3)
Calcium: 9.2 mg/dL (ref 8.9–10.3)
Chloride: 96 mmol/L — ABNORMAL LOW (ref 98–111)
Chloride: 101 mmol/L (ref 98–111)
Creatinine, Ser: 1.54 mg/dL — ABNORMAL HIGH (ref 0.61–1.24)
Creatinine, Ser: 1.76 mg/dL — ABNORMAL HIGH (ref 0.61–1.24)
GFR, Estimated: 55 mL/min — ABNORMAL LOW (ref >60)
GFR, Estimated: 47 mL/min — ABNORMAL LOW (ref >60)
Glucose, Bld: 519 mg/dL (ref 70–99)
Glucose, Bld: 211 mg/dL — ABNORMAL HIGH (ref 70–99)
Potassium: 4.7 mmol/L (ref 3.5–5.1)
Potassium: 2.8 mmol/L — ABNORMAL LOW (ref 3.5–5.1)
Sodium: 131 mmol/L — ABNORMAL LOW (ref 135–145)
Sodium: 135 mmol/L (ref 135–145)

## 2021-07-24 LAB — CBC WITH DIFFERENTIAL/PLATELET
Abs Immature Granulocytes: 0.09 10*3/uL — ABNORMAL HIGH (ref 0.00–0.07)
Basophils Absolute: 0.1 10*3/uL (ref 0.0–0.1)
Basophils Relative: 1 %
Eosinophils Absolute: 0 10*3/uL (ref 0.0–0.5)
Eosinophils Relative: 0 %
HCT: 41.5 % (ref 39.0–52.0)
Hemoglobin: 14.4 g/dL (ref 13.0–17.0)
Immature Granulocytes: 1 %
Lymphocytes Relative: 21 %
Lymphs Abs: 3.5 10*3/uL (ref 0.7–4.0)
MCH: 29.5 pg (ref 26.0–34.0)
MCHC: 34.7 g/dL (ref 30.0–36.0)
MCV: 85 fL (ref 80.0–100.0)
Monocytes Absolute: 1.4 10*3/uL — ABNORMAL HIGH (ref 0.1–1.0)
Monocytes Relative: 8 %
Neutro Abs: 11.5 10*3/uL — ABNORMAL HIGH (ref 1.7–7.7)
Neutrophils Relative %: 69 %
Platelets: 276 10*3/uL (ref 150–400)
RBC: 4.88 MIL/uL (ref 4.22–5.81)
RDW: 11.7 % (ref 11.5–15.5)
WBC: 16.6 10*3/uL — ABNORMAL HIGH (ref 4.0–10.5)
nRBC: 0 % (ref 0.0–0.2)

## 2021-07-24 LAB — GLUCOSE, CAPILLARY
Glucose-Capillary: 146 mg/dL — ABNORMAL HIGH (ref 70–99)
Glucose-Capillary: 153 mg/dL — ABNORMAL HIGH (ref 70–99)
Glucose-Capillary: 159 mg/dL — ABNORMAL HIGH (ref 70–99)
Glucose-Capillary: 171 mg/dL — ABNORMAL HIGH (ref 70–99)
Glucose-Capillary: 171 mg/dL — ABNORMAL HIGH (ref 70–99)
Glucose-Capillary: 183 mg/dL — ABNORMAL HIGH (ref 70–99)
Glucose-Capillary: 187 mg/dL — ABNORMAL HIGH (ref 70–99)
Glucose-Capillary: 190 mg/dL — ABNORMAL HIGH (ref 70–99)
Glucose-Capillary: 194 mg/dL — ABNORMAL HIGH (ref 70–99)
Glucose-Capillary: 201 mg/dL — ABNORMAL HIGH (ref 70–99)
Glucose-Capillary: 215 mg/dL — ABNORMAL HIGH (ref 70–99)
Glucose-Capillary: 217 mg/dL — ABNORMAL HIGH (ref 70–99)
Glucose-Capillary: 241 mg/dL — ABNORMAL HIGH (ref 70–99)
Glucose-Capillary: 338 mg/dL — ABNORMAL HIGH (ref 70–99)
Glucose-Capillary: 366 mg/dL — ABNORMAL HIGH (ref 70–99)
Glucose-Capillary: 531 mg/dL (ref 70–99)

## 2021-07-24 LAB — LIPID PANEL
Cholesterol: 198 mg/dL (ref 0–200)
HDL: 42 mg/dL (ref >40)
LDL Cholesterol: 131 mg/dL — ABNORMAL HIGH (ref 0–99)
Total CHOL/HDL Ratio: 4.7 RATIO
Triglycerides: 124 mg/dL (ref <150)
VLDL: 25 mg/dL (ref 0–40)

## 2021-07-24 LAB — CBC
HCT: 45.3 % (ref 39.0–52.0)
Hemoglobin: 15.3 g/dL (ref 13.0–17.0)
MCH: 28.9 pg (ref 26.0–34.0)
MCHC: 33.8 g/dL (ref 30.0–36.0)
MCV: 85.5 fL (ref 80.0–100.0)
Platelets: 358 10*3/uL (ref 150–400)
RBC: 5.3 MIL/uL (ref 4.22–5.81)
RDW: 11.8 % (ref 11.5–15.5)
WBC: 16.6 10*3/uL — ABNORMAL HIGH (ref 4.0–10.5)
nRBC: 0 % (ref 0.0–0.2)

## 2021-07-24 LAB — ECHOCARDIOGRAM COMPLETE
AR max vel: 2.94 cm2
AV Area VTI: 2.77 cm2
AV Area mean vel: 2.57 cm2
AV Mean grad: 2 mmHg
AV Peak grad: 3.5 mmHg
Ao pk vel: 0.93 m/s
Height: 69 in
S' Lateral: 5.1 cm
Weight: 2879.98 oz

## 2021-07-24 LAB — POCT I-STAT 7, (LYTES, BLD GAS, ICA,H+H)
Acid-Base Excess: 0 mmol/L (ref 0.0–2.0)
Acid-base deficit: 1 mmol/L (ref 0.0–2.0)
Bicarbonate: 23 mmol/L (ref 20.0–28.0)
Bicarbonate: 21 mmol/L (ref 20.0–28.0)
Calcium, Ion: 1.3 mmol/L (ref 1.15–1.40)
Calcium, Ion: 1.3 mmol/L (ref 1.15–1.40)
HCT: 38 % — ABNORMAL LOW (ref 39.0–52.0)
HCT: 38 % — ABNORMAL LOW (ref 39.0–52.0)
Hemoglobin: 12.9 g/dL — ABNORMAL LOW (ref 13.0–17.0)
Hemoglobin: 12.9 g/dL — ABNORMAL LOW (ref 13.0–17.0)
O2 Saturation: 98 %
O2 Saturation: 96 %
Patient temperature: 38.1
Potassium: 3.1 mmol/L — ABNORMAL LOW (ref 3.5–5.1)
Potassium: 3.1 mmol/L — ABNORMAL LOW (ref 3.5–5.1)
Sodium: 134 mmol/L — ABNORMAL LOW (ref 135–145)
Sodium: 136 mmol/L (ref 135–145)
TCO2: 24 mmol/L (ref 22–32)
TCO2: 22 mmol/L (ref 22–32)
pCO2 arterial: 30.4 mmHg — ABNORMAL LOW (ref 32–48)
pCO2 arterial: 28.4 mmHg — ABNORMAL LOW (ref 32–48)
pH, Arterial: 7.487 — ABNORMAL HIGH (ref 7.35–7.45)
pH, Arterial: 7.48 — ABNORMAL HIGH (ref 7.35–7.45)
pO2, Arterial: 93 mmHg (ref 83–108)
pO2, Arterial: 78 mmHg — ABNORMAL LOW (ref 83–108)

## 2021-07-24 LAB — COOXEMETRY PANEL
Carboxyhemoglobin: 0.7 % (ref 0.5–1.5)
Carboxyhemoglobin: 1 % (ref 0.5–1.5)
Carboxyhemoglobin: 1 % (ref 0.5–1.5)
Carboxyhemoglobin: 1.4 % (ref 0.5–1.5)
Methemoglobin: 0.8 % (ref 0.0–1.5)
Methemoglobin: <0.7 % (ref 0.0–1.5)
Methemoglobin: <0.7 % (ref 0.0–1.5)
Methemoglobin: <0.7 % (ref 0.0–1.5)
O2 Saturation: 52.7 %
O2 Saturation: 55.6 %
O2 Saturation: 55.6 %
O2 Saturation: 66.1 %
Total hemoglobin: 14.3 g/dL (ref 12.0–16.0)
Total hemoglobin: 13.5 g/dL (ref 12.0–16.0)
Total hemoglobin: 13.5 g/dL (ref 12.0–16.0)
Total hemoglobin: 13.3 g/dL (ref 12.0–16.0)

## 2021-07-24 LAB — HEMOGLOBIN A1C
Hgb A1c MFr Bld: 11.8 % — ABNORMAL HIGH (ref 4.8–5.6)
Mean Plasma Glucose: 291.96 mg/dL

## 2021-07-24 LAB — PROCALCITONIN: Procalcitonin: 0.31 ng/mL

## 2021-07-24 LAB — MAGNESIUM: Magnesium: 1.9 mg/dL (ref 1.7–2.4)

## 2021-07-24 LAB — POCT I-STAT EG7
Acid-Base Excess: 2 mmol/L (ref 0.0–2.0)
Bicarbonate: 24.5 mmol/L (ref 20.0–28.0)
Calcium, Ion: 1.29 mmol/L (ref 1.15–1.40)
HCT: 39 % (ref 39.0–52.0)
Hemoglobin: 13.3 g/dL (ref 13.0–17.0)
O2 Saturation: 58 %
Potassium: 3.2 mmol/L — ABNORMAL LOW (ref 3.5–5.1)
Sodium: 136 mmol/L (ref 135–145)
TCO2: 25 mmol/L (ref 22–32)
pCO2, Ven: 32.6 mmHg — ABNORMAL LOW (ref 44–60)
pH, Ven: 7.483 — ABNORMAL HIGH (ref 7.25–7.43)
pO2, Ven: 27 mmHg — CL (ref 32–45)

## 2021-07-24 LAB — POCT ACTIVATED CLOTTING TIME
Activated Clotting Time: 402 seconds
Activated Clotting Time: 329 seconds
Activated Clotting Time: 245 seconds

## 2021-07-24 LAB — LACTIC ACID, PLASMA
Lactic Acid, Venous: 1.9 mmol/L (ref 0.5–1.9)
Lactic Acid, Venous: 2.3 mmol/L (ref 0.5–1.9)

## 2021-07-24 LAB — HEPARIN LEVEL (UNFRACTIONATED)
Heparin Unfractionated: 0.43 IU/mL (ref 0.30–0.70)
Heparin Unfractionated: <0.1 IU/mL — ABNORMAL LOW (ref 0.30–0.70)

## 2021-07-24 LAB — BRAIN NATRIURETIC PEPTIDE: B Natriuretic Peptide: 431 pg/mL — ABNORMAL HIGH (ref 0.0–100.0)

## 2021-07-24 LAB — TROPONIN I (HIGH SENSITIVITY)
Troponin I (High Sensitivity): 3774 ng/L (ref <18)
Troponin I (High Sensitivity): 18731 ng/L (ref <18)

## 2021-07-24 LAB — MRSA NEXT GEN BY PCR, NASAL: MRSA by PCR Next Gen: DETECTED — AB

## 2021-07-24 SURGERY — RIGHT/LEFT HEART CATH AND CORONARY ANGIOGRAPHY
Anesthesia: LOCAL

## 2021-07-24 MED ORDER — VERAPAMIL HCL 2.5 MG/ML IV SOLN
INTRAVENOUS | Status: DC | PRN
  Administered 2021-07-24: 10 mL via INTRA_ARTERIAL

## 2021-07-24 MED ORDER — INSULIN REGULAR(HUMAN) IN NACL 100-0.9 UT/100ML-% IV SOLN
INTRAVENOUS | Status: DC
  Administered 2021-07-24: 6 [IU]/h via INTRAVENOUS
  Administered 2021-07-24: 14 [IU]/h via INTRAVENOUS
  Administered 2021-07-26: 13 [IU]/h via INTRAVENOUS
  Administered 2021-07-26: 10.5 [IU]/h via INTRAVENOUS
  Administered 2021-07-27: 6 [IU]/h via INTRAVENOUS
  Administered 2021-07-27: 11.5 [IU]/h via INTRAVENOUS
  Administered 2021-07-27: 14 [IU]/h via INTRAVENOUS
  Administered 2021-07-28: 17 [IU]/h via INTRAVENOUS
  Administered 2021-07-28: 15 [IU]/h via INTRAVENOUS
  Administered 2021-07-28: 14 [IU]/h via INTRAVENOUS
  Administered 2021-07-29: 21 [IU]/h via INTRAVENOUS
  Administered 2021-07-29: 15 [IU]/h via INTRAVENOUS
  Administered 2021-07-29: 23 [IU]/h via INTRAVENOUS
  Filled 2021-07-24 (×15): qty 100

## 2021-07-24 MED ORDER — HEPARIN (PORCINE) IN NACL 1000-0.9 UT/500ML-% IV SOLN
INTRAVENOUS | Status: DC | PRN
  Administered 2021-07-24 (×2): 500 mL

## 2021-07-24 MED ORDER — ASPIRIN 81 MG PO CHEW
81.0000 mg | CHEWABLE_TABLET | Freq: Every day | ORAL | Status: DC
  Administered 2021-07-25 – 2021-07-30 (×6): 81 mg via NASOGASTRIC
  Filled 2021-07-24 (×6): qty 1

## 2021-07-24 MED ORDER — DOBUTAMINE IN D5W 4-5 MG/ML-% IV SOLN
2.5000 ug/kg/min | INTRAVENOUS | Status: DC
  Administered 2021-07-24 – 2021-07-28 (×2): 2.5 ug/kg/min via INTRAVENOUS
  Administered 2021-07-30: 1.25 ug/kg/min via INTRAVENOUS
  Filled 2021-07-24 (×3): qty 250

## 2021-07-24 MED ORDER — HEPARIN (PORCINE) 25000 UT/250ML-% IV SOLN
1000.0000 [IU]/h | INTRAVENOUS | Status: DC
Start: 2021-07-24 — End: 2021-07-25
  Administered 2021-07-24 (×2): 800 [IU]/h via INTRAVENOUS
  Filled 2021-07-24: qty 250

## 2021-07-24 MED ORDER — LIDOCAINE HCL (PF) 1 % IJ SOLN
INTRAMUSCULAR | Status: DC | PRN
  Administered 2021-07-24: 2 mL via INTRADERMAL
  Administered 2021-07-24: 15 mL via INTRADERMAL
  Administered 2021-07-24: 20 mL via INTRADERMAL

## 2021-07-24 MED ORDER — SODIUM CHLORIDE 0.9 % IV SOLN
INTRAVENOUS | Status: DC | PRN
Start: 2021-07-24 — End: 2021-07-30

## 2021-07-24 MED ORDER — TICAGRELOR 90 MG PO TABS
90.0000 mg | ORAL_TABLET | Freq: Two times a day (BID) | ORAL | Status: DC
  Administered 2021-07-24: 90 mg via ORAL
  Filled 2021-07-24: qty 1

## 2021-07-24 MED ORDER — PANTOPRAZOLE SODIUM 40 MG IV SOLR
40.0000 mg | INTRAVENOUS | Status: DC
  Administered 2021-07-24 – 2021-07-27 (×4): 40 mg via INTRAVENOUS
  Filled 2021-07-24 (×4): qty 10

## 2021-07-24 MED ORDER — SUCCINYLCHOLINE CHLORIDE 200 MG/10ML IV SOSY
PREFILLED_SYRINGE | INTRAVENOUS | Status: AC
  Filled 2021-07-24: qty 10

## 2021-07-24 MED ORDER — FUROSEMIDE 10 MG/ML IJ SOLN
60.0000 mg | Freq: Once | INTRAMUSCULAR | Status: AC
  Administered 2021-07-24: 60 mg via INTRAVENOUS

## 2021-07-24 MED ORDER — LIDOCAINE HCL URETHRAL/MUCOSAL 2 % EX GEL
1.0000 "application " | Freq: Once | CUTANEOUS | Status: AC
  Administered 2021-07-24: 1 via URETHRAL
  Filled 2021-07-24: qty 6

## 2021-07-24 MED ORDER — FUROSEMIDE 10 MG/ML IJ SOLN
INTRAMUSCULAR | Status: AC
  Filled 2021-07-24: qty 4

## 2021-07-24 MED ORDER — SODIUM CHLORIDE 0.9 % IV SOLN: INTRAVENOUS | Status: DC | PRN

## 2021-07-24 MED ORDER — HEPARIN SODIUM (PORCINE) 5000 UNIT/ML IJ SOLN: 5000.0000 [IU] | Freq: Three times a day (TID) | INTRAMUSCULAR | Status: DC

## 2021-07-24 MED ORDER — ONDANSETRON HCL 4 MG/2ML IJ SOLN: 4.0000 mg | Freq: Four times a day (QID) | INTRAMUSCULAR | Status: DC | PRN

## 2021-07-24 MED ORDER — PHENYLEPHRINE 40 MCG/ML (10ML) SYRINGE FOR IV PUSH (FOR BLOOD PRESSURE SUPPORT)
PREFILLED_SYRINGE | INTRAVENOUS | Status: AC
  Filled 2021-07-24: qty 20

## 2021-07-24 MED ORDER — SODIUM CHLORIDE 0.9 % IV SOLN: INTRAVENOUS | Status: AC

## 2021-07-24 MED ORDER — FUROSEMIDE 10 MG/ML IJ SOLN
INTRAMUSCULAR | Status: AC
  Filled 2021-07-24: qty 2

## 2021-07-24 MED ORDER — MIDAZOLAM HCL 2 MG/2ML IJ SOLN
INTRAMUSCULAR | Status: AC
Start: 2021-07-24 — End: ?
  Filled 2021-07-24: qty 2

## 2021-07-24 MED ORDER — BUPROPION HCL 75 MG PO TABS
75.0000 mg | ORAL_TABLET | Freq: Two times a day (BID) | ORAL | Status: DC
  Administered 2021-07-24 – 2021-07-29 (×10): 75 mg
  Filled 2021-07-24 (×13): qty 1

## 2021-07-24 MED ORDER — HEPARIN (PORCINE) IN NACL 1000-0.9 UT/500ML-% IV SOLN
INTRAVENOUS | Status: AC
  Filled 2021-07-24: qty 500

## 2021-07-24 MED ORDER — FUROSEMIDE 10 MG/ML IJ SOLN: 60.0000 mg | Freq: Once | INTRAMUSCULAR | Status: DC

## 2021-07-24 MED ORDER — PERFLUTREN LIPID MICROSPHERE
1.0000 mL | INTRAVENOUS | Status: AC | PRN
  Administered 2021-07-24: 3 mL via INTRAVENOUS
  Filled 2021-07-24: qty 10

## 2021-07-24 MED ORDER — MIDAZOLAM HCL 2 MG/2ML IJ SOLN
2.0000 mg | INTRAMUSCULAR | Status: AC | PRN
  Administered 2021-07-26 (×2): 2 mg via INTRAVENOUS
  Filled 2021-07-24 (×2): qty 2

## 2021-07-24 MED ORDER — MIDAZOLAM HCL 2 MG/2ML IJ SOLN
INTRAMUSCULAR | Status: AC
  Filled 2021-07-24: qty 2

## 2021-07-24 MED ORDER — MILRINONE LACTATE IN DEXTROSE 20-5 MG/100ML-% IV SOLN
0.1250 ug/kg/min | INTRAVENOUS | Status: DC
  Administered 2021-07-24 (×2): 0.5 ug/kg/min via INTRAVENOUS
  Administered 2021-07-25 (×2): 0.25 ug/kg/min via INTRAVENOUS
  Filled 2021-07-24 (×3): qty 100

## 2021-07-24 MED ORDER — FENTANYL CITRATE (PF) 100 MCG/2ML IJ SOLN
INTRAMUSCULAR | Status: AC
  Filled 2021-07-24: qty 2

## 2021-07-24 MED ORDER — ACETAMINOPHEN 325 MG PO TABS: 650.0000 mg | ORAL_TABLET | ORAL | Status: DC | PRN

## 2021-07-24 MED ORDER — ORAL CARE MOUTH RINSE
15.0000 mL | OROMUCOSAL | Status: DC
  Administered 2021-07-24 – 2021-07-29 (×45): 15 mL via OROMUCOSAL

## 2021-07-24 MED ORDER — ACETAMINOPHEN 500 MG PO TABS
1000.0000 mg | ORAL_TABLET | Freq: Once | ORAL | Status: AC
  Administered 2021-07-24: 1000 mg via ORAL
  Filled 2021-07-24: qty 2

## 2021-07-24 MED ORDER — FENTANYL CITRATE (PF) 100 MCG/2ML IJ SOLN
INTRAMUSCULAR | Status: DC | PRN
  Administered 2021-07-24 (×4): 25 ug via INTRAVENOUS

## 2021-07-24 MED ORDER — FENTANYL 2500MCG IN NS 250ML (10MCG/ML) PREMIX INFUSION
0.0000 ug/h | INTRAVENOUS | Status: DC
  Administered 2021-07-24: 50 ug/h via INTRAVENOUS
  Administered 2021-07-25: 200 ug/h via INTRAVENOUS
  Administered 2021-07-25 – 2021-07-26 (×2): 350 ug/h via INTRAVENOUS
  Administered 2021-07-26: 325 ug/h via INTRAVENOUS
  Administered 2021-07-26: 350 ug/h via INTRAVENOUS
  Administered 2021-07-27: 100 ug/h via INTRAVENOUS
  Administered 2021-07-27: 275 ug/h via INTRAVENOUS
  Filled 2021-07-24 (×8): qty 250

## 2021-07-24 MED ORDER — FENTANYL CITRATE PF 50 MCG/ML IJ SOSY: 50.0000 ug | PREFILLED_SYRINGE | Freq: Once | INTRAMUSCULAR | Status: AC

## 2021-07-24 MED ORDER — FENTANYL BOLUS VIA INFUSION
50.0000 ug | INTRAVENOUS | Status: DC | PRN
  Administered 2021-07-25 – 2021-07-26 (×5): 50 ug via INTRAVENOUS
  Filled 2021-07-24: qty 100

## 2021-07-24 MED ORDER — DEXTROSE 50 % IV SOLN: 0.0000 mL | INTRAVENOUS | Status: DC | PRN

## 2021-07-24 MED ORDER — ORAL CARE MOUTH RINSE
15.0000 mL | Freq: Two times a day (BID) | OROMUCOSAL | Status: DC
  Administered 2021-07-24 – 2021-07-27 (×2): 15 mL via OROMUCOSAL

## 2021-07-24 MED ORDER — MIDAZOLAM HCL 2 MG/2ML IJ SOLN: 2.0000 mg | INTRAMUSCULAR | Status: DC | PRN

## 2021-07-24 MED ORDER — LIDOCAINE HCL (PF) 1 % IJ SOLN
INTRAMUSCULAR | Status: AC
  Filled 2021-07-24: qty 30

## 2021-07-24 MED ORDER — HYDRALAZINE HCL 20 MG/ML IJ SOLN: 10.0000 mg | INTRAMUSCULAR | Status: AC | PRN

## 2021-07-24 MED ORDER — SODIUM CHLORIDE 0.9% FLUSH
10.0000 mL | Freq: Two times a day (BID) | INTRAVENOUS | Status: DC
  Administered 2021-07-24 – 2021-07-29 (×8): 10 mL

## 2021-07-24 MED ORDER — MIDAZOLAM HCL 2 MG/2ML IJ SOLN
INTRAMUSCULAR | Status: DC | PRN
  Administered 2021-07-24 (×4): 1 mg via INTRAVENOUS

## 2021-07-24 MED ORDER — SODIUM CHLORIDE 0.9% FLUSH
3.0000 mL | Freq: Two times a day (BID) | INTRAVENOUS | Status: DC
  Administered 2021-07-25 – 2021-07-29 (×6): 3 mL via INTRAVENOUS

## 2021-07-24 MED ORDER — HEPARIN (PORCINE) IN NACL 1000-0.9 UT/500ML-% IV SOLN
INTRAVENOUS | Status: DC | PRN
  Administered 2021-07-24: 500 mL

## 2021-07-24 MED ORDER — HEPARIN SODIUM (PORCINE) 1000 UNIT/ML IJ SOLN
INTRAMUSCULAR | Status: DC | PRN
  Administered 2021-07-24: 8000 [IU] via INTRAVENOUS
  Administered 2021-07-24: 4000 [IU] via INTRAVENOUS
  Administered 2021-07-24: 2500 [IU] via INTRAVENOUS

## 2021-07-24 MED ORDER — TICAGRELOR 90 MG PO TABS
90.0000 mg | ORAL_TABLET | Freq: Two times a day (BID) | ORAL | Status: DC
  Administered 2021-07-24: 90 mg via NASOGASTRIC
  Filled 2021-07-24: qty 1

## 2021-07-24 MED ORDER — ROCURONIUM BROMIDE 10 MG/ML (PF) SYRINGE
PREFILLED_SYRINGE | INTRAVENOUS | Status: AC
  Administered 2021-07-24: 100 mg
  Filled 2021-07-24: qty 10

## 2021-07-24 MED ORDER — NOREPINEPHRINE 4 MG/250ML-% IV SOLN
INTRAVENOUS | Status: AC
  Filled 2021-07-24: qty 250

## 2021-07-24 MED ORDER — ASPIRIN 81 MG PO CHEW: 81.0000 mg | CHEWABLE_TABLET | Freq: Every day | ORAL | Status: DC

## 2021-07-24 MED ORDER — HEPARIN SODIUM (PORCINE) 1000 UNIT/ML IJ SOLN
INTRAMUSCULAR | Status: AC
  Filled 2021-07-24: qty 10

## 2021-07-24 MED ORDER — MILRINONE LACTATE IN DEXTROSE 20-5 MG/100ML-% IV SOLN
0.3750 ug/kg/min | INTRAVENOUS | Status: DC
  Administered 2021-07-24: 0.375 ug/kg/min via INTRAVENOUS

## 2021-07-24 MED ORDER — POTASSIUM CHLORIDE 10 MEQ/100ML IV SOLN
10.0000 meq | INTRAVENOUS | Status: AC
  Administered 2021-07-24 – 2021-07-25 (×6): 10 meq via INTRAVENOUS
  Filled 2021-07-24: qty 100

## 2021-07-24 MED ORDER — CHLORHEXIDINE GLUCONATE 0.12% ORAL RINSE (MEDLINE KIT)
15.0000 mL | Freq: Two times a day (BID) | OROMUCOSAL | Status: DC
  Administered 2021-07-24 – 2021-07-29 (×10): 15 mL via OROMUCOSAL

## 2021-07-24 MED ORDER — DOCUSATE SODIUM 50 MG/5ML PO LIQD
100.0000 mg | Freq: Two times a day (BID) | ORAL | Status: DC
  Administered 2021-07-24 – 2021-07-30 (×12): 100 mg
  Filled 2021-07-24 (×12): qty 10

## 2021-07-24 MED ORDER — SODIUM CHLORIDE 0.9 % IV SOLN
INTRAVENOUS | Status: AC | PRN
  Administered 2021-07-24 (×2): 10 mL/h via INTRAVENOUS

## 2021-07-24 MED ORDER — MILRINONE LACTATE IN DEXTROSE 20-5 MG/100ML-% IV SOLN
0.2500 ug/kg/min | INTRAVENOUS | Status: DC
  Administered 2021-07-24: 0.125 ug/kg/min via INTRAVENOUS
  Administered 2021-07-24: 0.25 ug/kg/min via INTRAVENOUS
  Filled 2021-07-24 (×2): qty 100

## 2021-07-24 MED ORDER — POLYETHYLENE GLYCOL 3350 17 G PO PACK
17.0000 g | PACK | Freq: Every day | ORAL | Status: DC
  Administered 2021-07-25: 17 g
  Filled 2021-07-24: qty 1

## 2021-07-24 MED ORDER — MAGNESIUM SULFATE 2 GM/50ML IV SOLN
2.0000 g | Freq: Once | INTRAVENOUS | Status: AC
  Administered 2021-07-24: 2 g via INTRAVENOUS
  Filled 2021-07-24: qty 50

## 2021-07-24 MED ORDER — ATORVASTATIN CALCIUM 80 MG PO TABS
80.0000 mg | ORAL_TABLET | Freq: Every day | ORAL | Status: DC
  Administered 2021-07-24 – 2021-07-30 (×7): 80 mg via NASOGASTRIC
  Filled 2021-07-24 (×6): qty 1

## 2021-07-24 MED ORDER — SODIUM CHLORIDE 0.9% FLUSH
3.0000 mL | INTRAVENOUS | Status: DC | PRN
  Administered 2021-07-27: 3 mL via INTRAVENOUS

## 2021-07-24 MED ORDER — LABETALOL HCL 5 MG/ML IV SOLN: 10.0000 mg | INTRAVENOUS | Status: AC | PRN

## 2021-07-24 MED ORDER — FENTANYL CITRATE PF 50 MCG/ML IJ SOSY
PREFILLED_SYRINGE | INTRAMUSCULAR | Status: AC
  Administered 2021-07-24: 25 ug via INTRAVENOUS
  Filled 2021-07-24: qty 2

## 2021-07-24 MED ORDER — NOREPINEPHRINE 4 MG/250ML-% IV SOLN
0.0000 ug/min | INTRAVENOUS | Status: DC
  Administered 2021-07-24: 5 ug/min via INTRAVENOUS
  Filled 2021-07-24: qty 250

## 2021-07-24 MED ORDER — NOREPINEPHRINE 16 MG/250ML-% IV SOLN
0.0000 ug/min | INTRAVENOUS | Status: DC
  Administered 2021-07-24: 20 ug/min via INTRAVENOUS
  Administered 2021-07-25 – 2021-07-26 (×2): 10 ug/min via INTRAVENOUS
  Filled 2021-07-24 (×3): qty 250

## 2021-07-24 MED ORDER — SODIUM CHLORIDE 0.9% FLUSH: 10.0000 mL | INTRAVENOUS | Status: DC | PRN

## 2021-07-24 MED ORDER — ETOMIDATE 2 MG/ML IV SOLN: 20.0000 mg | Freq: Once | INTRAVENOUS | Status: AC

## 2021-07-24 MED ORDER — SODIUM CHLORIDE 0.9 % IV SOLN: 250.0000 mL | INTRAVENOUS | Status: DC | PRN

## 2021-07-24 MED ORDER — ACETAMINOPHEN 325 MG PO TABS
650.0000 mg | ORAL_TABLET | ORAL | Status: DC | PRN
  Administered 2021-07-24: 650 mg via ORAL
  Filled 2021-07-24: qty 2

## 2021-07-24 MED ORDER — FUROSEMIDE 10 MG/ML IJ SOLN
60.0000 mg | Freq: Once | INTRAMUSCULAR | Status: DC
Start: 2021-07-24 — End: 2021-07-24

## 2021-07-24 MED ORDER — ETOMIDATE 2 MG/ML IV SOLN
INTRAVENOUS | Status: AC
  Filled 2021-07-24: qty 20

## 2021-07-24 MED ORDER — LACTATED RINGERS IV SOLN: INTRAVENOUS | Status: DC

## 2021-07-24 MED ORDER — DEXMEDETOMIDINE HCL IN NACL 400 MCG/100ML IV SOLN
0.0000 ug/kg/h | INTRAVENOUS | Status: AC
  Administered 2021-07-24: 0.4 ug/kg/h via INTRAVENOUS
  Administered 2021-07-24: 0.6 ug/kg/h via INTRAVENOUS
  Administered 2021-07-25: 0.8 ug/kg/h via INTRAVENOUS
  Administered 2021-07-25: 1 ug/kg/h via INTRAVENOUS
  Administered 2021-07-25: .8 ug/kg/h via INTRAVENOUS
  Administered 2021-07-26: 0.9 ug/kg/h via INTRAVENOUS
  Administered 2021-07-26 (×3): 1 ug/kg/h via INTRAVENOUS
  Administered 2021-07-27 (×2): 0.9 ug/kg/h via INTRAVENOUS
  Filled 2021-07-24 (×8): qty 100
  Filled 2021-07-24: qty 200

## 2021-07-24 MED ORDER — MIDAZOLAM HCL 2 MG/2ML IJ SOLN
INTRAMUSCULAR | Status: AC
  Administered 2021-07-24: 2 mg via INTRAVENOUS
  Filled 2021-07-24: qty 2

## 2021-07-24 MED ORDER — DEXTROSE IN LACTATED RINGERS 5 % IV SOLN
INTRAVENOUS | Status: DC
  Filled 2021-07-24: qty 1000

## 2021-07-24 MED ORDER — CHLORHEXIDINE GLUCONATE CLOTH 2 % EX PADS
6.0000 | MEDICATED_PAD | Freq: Every day | CUTANEOUS | Status: DC
  Administered 2021-07-24 – 2021-07-29 (×5): 6 via TOPICAL

## 2021-07-24 MED ORDER — VERAPAMIL HCL 2.5 MG/ML IV SOLN
INTRAVENOUS | Status: AC
  Filled 2021-07-24: qty 2

## 2021-07-24 MED ORDER — FOLIC ACID 1 MG PO TABS
1.0000 mg | ORAL_TABLET | Freq: Every day | ORAL | Status: DC
  Administered 2021-07-25 – 2021-07-30 (×6): 1 mg
  Filled 2021-07-24 (×6): qty 1

## 2021-07-24 MED ORDER — ADULT MULTIVITAMIN W/MINERALS CH
1.0000 | ORAL_TABLET | Freq: Every day | ORAL | Status: DC
  Administered 2021-07-25 – 2021-07-30 (×6): 1
  Filled 2021-07-24 (×6): qty 1

## 2021-07-24 MED ORDER — KETAMINE HCL 50 MG/5ML IJ SOSY
PREFILLED_SYRINGE | INTRAMUSCULAR | Status: AC
  Filled 2021-07-24: qty 5

## 2021-07-24 SURGICAL SUPPLY — 32 items
BAG SNAP BAND KOVER 36X36 (MISCELLANEOUS) ×1 IMPLANT
BALLN IABP SENSA PLUS 8F 50CC (BALLOONS) ×4
BALLN SAPPHIRE 2.0X12 (BALLOONS) ×2
BALLN ~~LOC~~ EMERGE MR 3.5X6 (BALLOONS) ×2
BALLOON IABP SENS PLUS 8F 50CC (BALLOONS) IMPLANT
BALLOON SAPPHIRE 2.0X12 (BALLOONS) IMPLANT
BALLOON ~~LOC~~ EMERGE MR 3.5X6 (BALLOONS) IMPLANT
CATH 5FR JL3.5 JR4 ANG PIG MP (CATHETERS) ×1 IMPLANT
CATH INFINITI 5FR MPB2 (CATHETERS) ×1 IMPLANT
CATH SWAN GANZ VIP 7.5F (CATHETERS) ×1 IMPLANT
CATH VISTA GUIDE 6FR XBLAD3.5 (CATHETERS) ×1 IMPLANT
COVER DOME SNAP 22 D (MISCELLANEOUS) ×1 IMPLANT
DEVICE RAD COMP TR BAND LRG (VASCULAR PRODUCTS) ×1 IMPLANT
DILATOR VESSEL 38 20CM 7FR (INTRODUCER) ×1 IMPLANT
ELECT DEFIB PAD ADLT CADENCE (PAD) ×1 IMPLANT
GLIDESHEATH SLEND A-KIT 6F 22G (SHEATH) ×1 IMPLANT
GLIDESHEATH SLEND SS 6F .021 (SHEATH) ×1 IMPLANT
GUIDEWIRE INQWIRE 1.5J.035X260 (WIRE) IMPLANT
INQWIRE 1.5J .035X260CM (WIRE) ×2
KIT ENCORE 26 ADVANTAGE (KITS) ×1 IMPLANT
KIT HEART LEFT (KITS) ×2 IMPLANT
MAT PREVALON FULL STRYKER (MISCELLANEOUS) ×1 IMPLANT
PACK CARDIAC CATHETERIZATION (CUSTOM PROCEDURE TRAY) ×2 IMPLANT
SHEATH PINNACLE 8F 10CM (SHEATH) ×1 IMPLANT
SHEATH PROBE COVER 6X72 (BAG) ×1 IMPLANT
SLEEVE REPOSITIONING LENGTH 30 (MISCELLANEOUS) ×1 IMPLANT
STENT SYNERGY XD 3.0X12 (Permanent Stent) IMPLANT
SYNERGY XD 3.0X12 (Permanent Stent) ×2 IMPLANT
TRANSDUCER W/STOPCOCK (MISCELLANEOUS) ×2 IMPLANT
TUBING CIL FLEX 10 FLL-RA (TUBING) ×2 IMPLANT
WIRE AMPLATZ SS-J .035X180CM (WIRE) ×1 IMPLANT
WIRE ASAHI PROWATER 180CM (WIRE) ×1 IMPLANT

## 2021-07-24 NOTE — Progress Notes (Signed)
Peripherally Inserted Central Catheter Placement ? ?The IV Nurse has discussed with the patient and/or persons authorized to consent for the patient, the purpose of this procedure and the potential benefits and risks involved with this procedure.  The benefits include less needle sticks, lab draws from the catheter, and the patient may be discharged home with the catheter. Risks include, but not limited to, infection, bleeding, blood clot (thrombus formation), and puncture of an artery; nerve damage and irregular heartbeat and possibility to perform a PICC exchange if needed/ordered by physician.  Alternatives to this procedure were also discussed.  Bard Power PICC patient education guide, fact sheet on infection prevention and patient information card has been provided to patient /or left at bedside.   ? ?PICC Placement Documentation  ?PICC Triple Lumen 08/14/2021 Left Basilic 45 cm 0 cm (Active)  ?Indication for Insertion or Continuance of Line Vasoactive infusions;Chronic illness with exacerbations (CF, Sickle Cell, etc.);Prolonged intravenous therapies 08/04/2021 1000  ?Exposed Catheter (cm) 0 cm 08/06/2021 1000  ?Site Assessment Clean, Dry, Intact 08/07/2021 1000  ?Lumen #1 Status Flushed;Saline locked;Blood return noted 08/15/2021 1000  ?Lumen #2 Status Flushed;Saline locked;Blood return noted 08/14/2021 1000  ?Lumen #3 Status Flushed;Saline locked;Blood return noted 08/06/2021 1000  ?Dressing Type Securing device;Transparent 07/29/2021 1000  ?Dressing Status Antimicrobial disc in place 08/04/2021 1000  ?Safety Lock Not Applicable 08/14/2021 1000  ?Line Care Connections checked and tightened 08/10/2021 1000  ?Line Adjustment (NICU/IV Team Only) No 08/19/2021 1000  ?Dressing Intervention New dressing 07/25/2021 1000  ?Dressing Change Due 07/31/21 08/03/2021 1000  ? ? ? ? ? ?Elenore Paddy ?08/20/2021, 10:18 AM ? ?

## 2021-07-24 NOTE — Progress Notes (Signed)
ANTICOAGULATION CONSULT NOTE  ? ?Pharmacy Consult for heparin ?Indication: chest pain/ACS ? ?Allergies  ?Allergen Reactions  ? Penicillins Other (See Comments) and Anaphylaxis  ?  Has patient had a PCN reaction causing immediate rash, facial/tongue/throat swelling, SOB or lightheadedness with hypotension: Yes ?Has patient had a PCN reaction causing severe rash involving mucus membranes or skin necrosis: No ?Has patient had a PCN reaction that required hospitalization: No ?Has patient had a PCN reaction occurring within the last 10 years: No ?If all of the above answers are "NO", then may proceed with Cephalosporin use. ?  ? Empagliflozin   ?  Other reaction(s): Urinary tract infectious disease  ? Liraglutide Nausea And Vomiting  ? Penicillin G Rash and Hives  ? ? ?Patient Measurements: ?Height: 5\' 9"  (175.3 cm) ?Weight: 81.6 kg (180 lb) ?IBW/kg (Calculated) : 70.7 ?Heparin Dosing Weight: TBW ? ?Vital Signs: ?Temp: 98.1 ?F (36.7 ?C) (04/02 0352) ?Temp Source: Axillary (04/02 0352) ?BP: 118/100 (04/02 0400) ?Pulse Rate: 93 (04/02 0400) ? ?Labs: ?Recent Labs  ?  08/16/2021 ?2029 08/19/2021 ?2030 08/21/2021 ?0017 07/25/2021 ?09/23/21 07/31/2021 ?0350  ?HGB 15.6 15.3 15.3  --  14.4  ?HCT 46.0 44.7 45.3  --  41.5  ?PLT  --  306 358  --  276  ?HEPARINUNFRC  --   --  0.43  --  <0.10*  ?CREATININE 1.20  1.20 1.39* 1.54*  --   --   ?TROPONINIHS  --  2,234* 3,[id number]*  --   ? ? ? ?Estimated Creatinine Clearance: 58 mL/min (A) (by C-G formula based on SCr of 1.54 mg/dL (H)). ? ? ?Assessment: ?76 YOM presenting with CP, SOB over last few days, elevated troponin.  He is not on anticoagulation PTA, CBC wnl. ? ?Heparin level undetectable on infusion at 1000 units/hr. Spoke with RN and pt pulled line out and heparin off x 30 min right before level was drawn. ? ?Goal of Therapy:  ?Heparin level 0.3-0.7 units/ml ?Monitor platelets by anticoagulation protocol: Yes ?  ?Plan:  ?Continue heparin at 1000 units/hr ?Will f/u 6 hr heparin level post  restart  ? ?56, PharmD, BCPS ?Please see amion for complete clinical pharmacist phone list ?07/27/2021 4:32 AM ? ? ? ?

## 2021-07-24 NOTE — Progress Notes (Signed)
? ?NAME:  [name] [name], MRN:  [id number], DOB:  1970/09/19, LOS: 1 ?ADMISSION DATE:  08/11/2021, CONSULTATION DATE:  08/11/2021 ?REFERRING MD:  Dr. Maryellen Pile CHIEF COMPLAINT:  Chest Pain  ? ?History of Present Illness:  ?52 year old male who presented to the emergency room with new onset chest pain.  Patient states that he had substernal chest pain/pressure radiating to the left side of his chest.  He denies radiculopathy into the arm or into the jaw.  Prior to coming to the hospital he was short of breath and diaphoretic with chest pain.  He denies any nausea or vomiting.  Patient being seen at [facility] emergency room 2 days ago for a diabetic foot ulcer on the right foot.  He was sent home at that time on doxycycline.  His blood sugars generally out-of-control with a last hemoglobin A1c of 11.  Currently the patient states that he is a 1 out of 10 chest pressure.  Patient has a history of myocardial infarction with angioplasty in 2021.  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 ? ?Pertinent  Medical History  ?Uncontrolled diabetes mellitus type 2 ?Right great toe foot ulcer ?Myocardial infarction ?CVA ?Hypertension ? ?Significant Hospital Events: ?Including procedures, antibiotic start and stop dates in addition to other pertinent events   ?4/2 admitted to ICU, intubated for heart cath ? ?Interim History / Subjective:  ? ?Patient admitted to ICU overnight ?Cardiology requested patient be intubated for heart catheterization ?Patient's son at the bedside, all agreed to move forward with procedure. ? ?Objective   ?Blood pressure (!) 132/108, pulse (!) 105, temperature 98.1 ?F (36.7 ?C), temperature source Axillary, resp. rate 14, height 5\' 9"  (1.753 m), weight 81.6 kg, SpO2 95 %. ?   ?   ? ?Intake/Output Summary (Last 24 hours) at 08/07/2021 0926 ?Last data filed at 08/02/2021 0800 ?Gross per 24 hour  ?Intake 1076.28 ml  ?Output --  ?Net 1076.28 ml  ? ?Filed Weights   ? 08/12/2021 2021  ?Weight: 81.6 kg  ? ? ?Examination: ?General: middle aged man, resting in bed, no acute distress ?HENT: [place]/AT, moist mucous membranes, sclera anicteric ?Lungs: clear to auscultation bilaterally. No wheezing. ?Cardiovascular: tachycardic, no murmurs ?Abdomen: soft, non-tender, non-distended, BS+ ?Extremities: warm, no edema ?Neuro: alert and oriented x 3, moving all extremities ?GU: n/a ? ?Resolved Hospital Problem list   ? ? ?Assessment & Plan:  ? ?Shock ?Cardiogenic with possible concern for component of sepsis ?- continue milrinone per cardiology ? ?CAD ?Acute Systolic Heart Failure ?- Cardiology and Advanced Heart Failure following ?- Heart cath today ?- Diuresis per cardiology ?- Continue ASA, Brilinta. ?- Atorvastatin 80 mg daily ? ?Acute Respiratory Failure ?In setting of shock ?- continue mechanical ventilatory support ?- PAD protocol ?- VAP prevention protocol ordered ? ?Diabetes Mellitus Type II ?Hyperglycemia ?- Continue insulin drip for now ? ??Sepsis ?Diabetic Foot Ulcer ?- started on vancomycin and aztreonam ? ?Best Practice (right click and "Reselect all SmartList Selections" daily)  ? ?Diet/type: NPO ?DVT prophylaxis: systemic heparin ?GI prophylaxis: PPI ?Lines: Central line ?Foley:  Yes, and it is still needed ?Code Status:  full code ?Last date of multidisciplinary goals of care discussion [4/2 discussed with patient and son] ? ?Labs   ?CBC: ?Recent Labs  ?Lab 07/19/21 ?2310 08/08/2021 ?2029 08/21/2021 ?2030 08/03/2021 ?0017 07/25/2021 ?0350  ?WBC 11.5*  --  15.2* 16.6* 16.6*  ?NEUTROABS 8.0*  --   --   --  11.5*  ?HGB 15.5 15.6 15.3 15.3 14.4  ?HCT 45.4 46.0 44.7 45.3 41.5  ?MCV 85.0  --  86.3 85.5 85.0  ?PLT 323  --  306 358 276  ? ? ?Basic Metabolic Panel: ?Recent Labs  ?Lab 07/19/21 ?2310 08/12/2021 ?2029 08/18/2021 ?2030 08/01/2021 ?0017  ?NA 131* 129* 129* 131*  ?K 3.8 5.3* 5.1 4.7  ?CL 97* 101 97* 96*  ?CO2 26  --  18* 24  ?GLUCOSE 272* 535* 529* 519*  ?BUN 23* 31* 23* 26*  ?CREATININE  1.00 1.20  1.20 1.39* 1.54*  ?CALCIUM 10.1  --  9.3 9.9  ?MG  --   --   --  1.9  ? ?GFR: ?Estimated Creatinine Clearance: 58 mL/min (A) (by C-G formula based on SCr of 1.54 mg/dL (H)). ?Recent Labs  ?Lab 07/19/21 ?2310 07/31/2021 ?2030 08/14/2021 ?0017 08/03/2021 ?0350  ?PROCALCITON  --   --  0.31  --   ?WBC 11.5* 15.2* 16.6* 16.6*  ?LATICACIDVEN  --   --  1.9  --   ? ? ?Liver Function Tests: ?Recent Labs  ?Lab 07/19/21 ?2310 08/11/2021 ?2030  ?AST 40 54*  ?ALT 41 35  ?ALKPHOS 97 81  ?BILITOT 0.5 1.5*  ?PROT 8.5* 7.3  ?ALBUMIN 3.9 2.8*  ? ?No results for input(s): LIPASE, AMYLASE in the last 168 hours. ?No results for input(s): AMMONIA in the last 168 hours. ? ?ABG ?   ?Component Value Date/Time  ? TCO2 20 (L) 08/21/2021 2029  ?  ? ?Coagulation Profile: ?No results for input(s): INR, PROTIME in the last 168 hours. ? ?Cardiac Enzymes: ?No results for input(s): CKTOTAL, CKMB, CKMBINDEX, TROPONINI in the last 168 hours. ? ?HbA1C: ?Hemoglobin A1C  ?Date/Time Value Ref Range Status  ?03/12/2017 12:00 AM 9.0  Final  ?03/12/2017 12:00 AM 9.0  Final  ? ?Hgb A1c MFr Bld  ?Date/Time Value Ref Range Status  ?07/28/2021 12:17 AM 11.8 (H) 4.8 - 5.6 % Final  ?  Comment:  ?  (NOTE) ?Pre diabetes:          5.7%-6.4% ? ?Diabetes:              >6.4% ? ?Glycemic control for   <7.0% ?adults with diabetes ?  ?01/09/2021 01:40 PM 10.9 (H) 4.8 - 5.6 % Final  ?  Comment:  ?  (NOTE) ?Pre diabetes:          5.7%-6.4% ? ?Diabetes:              >6.4% ? ?Glycemic control for   <7.0% ?adults with diabetes ?  ? ? ?CBG: ?Recent Labs  ?Lab 08/09/2021 ?0346 08/19/2021 ?0455 08/05/2021 ?[id number] 08/08/2021 ?[id number] 08/20/2021 ?[id number]  ?GLUCAP 217* 241* 187* 171* 159*  ? ? ?Review of Systems:   ?Review of Systems  ?Constitutional:  Negative for chills, fever, malaise/fatigue and weight loss.  ?HENT:  Negative for congestion, sinus pain and sore throat.   ?Eyes: Negative.   ?Respiratory:  Positive for shortness of breath. Negative for cough, hemoptysis, sputum production and  wheezing.   ?Cardiovascular:  Positive for chest pain. Negative for palpitations, orthopnea, claudication and leg swelling.  ?Gastrointestinal:  Negative for abdominal pain, heartburn, nausea and vomiting.  ?Genitourinary: Negative.   ?Musculoskeletal:  Negative for joint pain and myalgias.  ?Skin:  Negative for rash.  ?Neurological:  Negative for weakness.  ?Endo/Heme/Allergies: Negative.   ?Psychiatric/Behavioral: Negative.    ? ? ?Past Medical History:  ?He,  has a past medical history of Diabetes mellitus without complication (  [place]), Hypertension, and Stroke ([place]).  ? ?Surgical History:  ? ?Past Surgical History:  ?Procedure Laterality Date  ? APPENDECTOMY    ? BACK SURGERY    ? CORONARY/GRAFT ACUTE MI REVASCULARIZATION N/A 01/23/2020  ? Procedure: Coronary/Graft Acute MI Revascularization;  Surgeon: Yolonda Kida, MD;  Location: [facility];  Service: Cardiovascular;  Laterality: N/A;  ? LEFT HEART CATH AND CORONARY ANGIOGRAPHY N/A 01/23/2020  ? Procedure: LEFT HEART CATH AND CORONARY ANGIOGRAPHY;  Surgeon: Yolonda Kida, MD;  Location: [facility];  Service: Cardiovascular;  Laterality: N/A;  ?  ? ?Social History:  ? reports that he has been smoking cigarettes. He has been smoking an average of .25 packs per day. He has never used smokeless tobacco. He reports that he does not drink alcohol and does not use drugs.  ? ?Family History:  ?His family history includes Diabetes in his father; Heart failure in his father and mother; Stroke in his mother.  ? ?Allergies ?Allergies  ?Allergen Reactions  ? Penicillins Other (See Comments) and Anaphylaxis  ?  Has patient had a PCN reaction causing immediate rash, facial/tongue/throat swelling, SOB or lightheadedness with hypotension: Yes ?Has patient had a PCN reaction causing severe rash involving mucus membranes or skin necrosis: No ?Has patient had a PCN reaction that required hospitalization: No ?Has patient had a PCN reaction occurring within  the last 10 years: No ?If all of the above answers are "NO", then may proceed with Cephalosporin use. ?  ? Empagliflozin   ?  Other reaction(s): Urinary tract infectious disease  ? Liraglutide Nausea And

## 2021-07-24 NOTE — Consult Note (Signed)
Reason for Consult: Difficult Foley / Need for Accurate Urine Output Monitoring ? ?Referring Physician: Sinda Du MD ? ?[name] [name] is an 51 y.o. male.  ? ?HPI:  ? ?1 - Difficult Foley / Need for Accurate Urine Output Monitoring / Distal Urethral Stricture- Pt intubated in CV ICU with inability by multiple providers to place foley. Prostate vol 7mL by recent CT (not enlarged). No known prior GU surgery. ? ?PMH sig for non-compliant DM2 (A1c 12), severe CAD/MI/Stent. ? ?Today "[name] [name]" is seen as emergent consult for above. He is in CV ICU with MI / heart failure / just had balloon pump placed and cardiac stent. ALso infected diabetic foot ulcer with fevers. Trop I 18k. He is on blood thinners and non-ambulatory at baseline.  ? ?Past Medical History:  ?Diagnosis Date  ? Diabetes mellitus without complication ([place])   ? Hypertension   ? Stroke [facility])   ? ? ?Past Surgical History:  ?Procedure Laterality Date  ? APPENDECTOMY    ? BACK SURGERY    ? CORONARY/GRAFT ACUTE MI REVASCULARIZATION N/A 01/23/2020  ? Procedure: Coronary/Graft Acute MI Revascularization;  Surgeon: Yolonda Kida, MD;  Location: [place] [facility];  Service: Cardiovascular;  Laterality: N/A;  ? LEFT HEART CATH AND CORONARY ANGIOGRAPHY N/A 01/23/2020  ? Procedure: LEFT HEART CATH AND CORONARY ANGIOGRAPHY;  Surgeon: Yolonda Kida, MD;  Location: [place] [facility];  Service: Cardiovascular;  Laterality: N/A;  ? ? ?Family History  ?Problem Relation Age of Onset  ? Stroke Mother   ? Heart failure Mother   ? Heart failure Father   ? Diabetes Father   ? ? ?Social History:  reports that he has been smoking cigarettes. He has been smoking an average of .25 packs per day. He has never used smokeless tobacco. He reports that he does not drink alcohol and does not use drugs. ? ?Allergies:  ?Allergies  ?Allergen Reactions  ? Penicillins Other (See Comments) and Anaphylaxis  ?  Has patient had a PCN reaction causing immediate rash,  facial/tongue/throat swelling, SOB or lightheadedness with hypotension: Yes ?Has patient had a PCN reaction causing severe rash involving mucus membranes or skin necrosis: No ?Has patient had a PCN reaction that required hospitalization: No ?Has patient had a PCN reaction occurring within the last 10 years: No ?If all of the above answers are "NO", then may proceed with Cephalosporin use. ?  ? Empagliflozin   ?  Other reaction(s): Urinary tract infectious disease  ? Liraglutide Nausea And Vomiting  ? Penicillin G Rash and Hives  ? ? ?Medications: I have reviewed the patient's current medications. ? ? ?Review of Systems ?Blood pressure (!) 86/63, pulse (!) 114, temperature (!) 101.7 ?F (38.7 ?C), resp. rate 18, height 5\' 9"  (1.753 m), weight 81.6 kg, SpO2 100 %. ?Physical Exam ?GCS3T intubated in CVICU. Family at bedside and RN and extender. All very pleasant and helpful ?HR 114 narrow complex on monitor ?Breath sounds non-coarse on vent ?Moderate truncal obesity, distended bladder ?UNcirc'd, no phimosis ?Bilateral groin lines in w/o hematomas ?LE alopecia c/w vasc disease ? ?BEDSIDE CYSTO  / URETHRAL DILATION: ?After prepping with iodine, cystourethroscopy perfomed with 49F flexible scope. 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 Prosatate opne, bladder distended but otherwise unremarkable. Marland Kitchen038senor wire placed into bladder over which 38F council catheter was used to furhter dilate stricture to 38F. 10cc water in balloon, connected to straiight drain, immeidate efflux  700cc non-foul yellow urine. No bleeding.  ? ?Assessment/Plan: ? ? ?1 - Difficult Foley / Need for Accurate Urine Output Monitoring / Distal Urethral Stricture- s/p urethral dilation and catheter placement. Present catheter can come out whenever not needed for accurate urine output monitoring as stricture fairly short segment.  ? ?Please call me directly with questions anytime.    ? ?Prognosis appears grim in this non-compliant man. Strongly consider comfort care transition to avoid frankly futile care if no meaningful CV recovery soon after heroic measures today.  ? ? ?[name] ?08/15/2021, 5:41 PM  ? ? ? ? ?

## 2021-07-24 NOTE — Plan of Care (Addendum)
Cardiology Plan of Care ? ?Mr. Hempen is admitted with cardiogenic shock in the setting of multivessel CAD with anterior MI and new HFrEF s/p PCI of ostial/prox LAD and IABP earlier today. He has required escalation of inotrope support with milrinone and concomitant increase in norepinephrine, with dobutamine 2.5 added early this evening. There was a discussion with advanced heart failure and the decision was made not to escalate MCS due to concerns about his recovery and lack of exit strategy should he fail to improve.  ? ?He is currently intubated and on milrinone 0.5, dobutamine 2.5, norepi 22.  ?His CVP is now around 10 and cvO2 by co-ox improved to 66. ?Despite this, he has become more tachycardic and more hypotensive with recent escalations in inotropes (mil to 0.5, addition of dobutamine), HR now persistently 140s in sinus rhythm and more frequent ventricular ectopy which he has tolerated poorly.  ?I think his ectopy may be partly related to hypokalemia (K 2.8->3.1) which is still being replaced, so we are holding on further diuresis for now. He also has a low-grade fever that may be related to his MI, though is being covered broadly with antibiotics for sepsis from a SSTI source (less likely). ? ?Will decrease milrinone to 0.375 and recheck co-ox/lactate at midnight. I think he is tolerating high-dose milrinone poorly. Suppress fever given instability. He would probably benefit from improved LV unloading from a recovery standpoint, but the concern about escalating MCS is understandable given his comorbidities.  ?  ?

## 2021-07-24 NOTE — Consult Note (Addendum)
INTERVENTIONAL CARDIOLOGY ? ?Reviewed case with colleagues, Dr. Annia Belt and Dr. Loralie Champagne (advanced heart failure team). ?Poorly controlled diabetes, diffuse coronary disease by cath in 2021 with apical LAD POBA.  Now with EF less than 20%, images reviewed from 2021 reveal very high bifurcation of the right femoral.  Creatinine is 1 5, EF by echo is less than 20% (inferobasal and anterobasal are only segments to move.  Images personally reviewed), hemoglobin A1c is greater than 11.  History of prior stroke, wheelchair-bound, and felt probably not a candidate for LVAD by Dr. [name]. ?I requested that the patient be intubated before coming to the lab.  Have called [facility] in case Impella is needed. ?Troponin I: 2234-->3774-->18,731.  Blood sugar greater than 500.  BNP 431.  Chest x-ray lung fields. ?We will attempt cath/PCI from radial approach (right arm), Swan-Ganz from right femoral, and support from right femoral artery or left femoral artery depending upon ultrasound imaging (if needed).  Prior images from 2021 were personally reviewed. ?Suspect has occluded the more proximal LAD given enzyme elevation and the appearance of EKG which reveals bifascicular block and persisting ST elevation precordial leads. ? ?Critical care time 40 minutes ?

## 2021-07-24 NOTE — Consult Note (Signed)
? ?NAME:  [name] [name], MRN:  [id number], DOB:  [birth year], LOS: 1 ?ADMISSION DATE:  07/31/2021, CONSULTATION DATE: 08/21/2021 ?REFERRING MD: Dr. Maryellen Pile, CHIEF COMPLAINT: Chest pain ? ?History of Present Illness:  ?This is a 51 year old white male who presented to the emergency room with new onset chest pain.  Patient states that he had substernal chest pain/pressure radiating to the left side of his chest.  He denies radiculopathy into the arm or into the jaw.  Prior to coming to the hospital he was short of breath and diaphoretic with chest pain.  He denies any nausea or vomiting.  Patient being seen at [facility] emergency room 2 days ago for a diabetic foot ulcer on the right foot.  He was sent home at that time on doxycycline.  His blood sugars generally out-of-control with a last hemoglobin A1c of 11.  Currently the patient states that he is a 1 out of 10 chest pressure.  Patient has a history of myocardial infarction with angioplasty in 2021.  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 ? ?Pertinent  Medical History  ?Uncontrolled diabetes mellitus type 2 ?Right great toe foot ulcer ?Myocardial infarction ?CVA ?Hypertension ? ?Significant Hospital Events: ?Including procedures, antibiotic start and stop dates in addition to other pertinent events   ? ? ?Objective   ?Blood pressure 104/89, pulse 94, resp. rate (!) 25, height 5\' 9"  (1.753 m), weight 81.6 kg, SpO2 91 %. ?   ?   ?No intake or output data in the 24 hours ending 08/04/2021 0013 ?Filed Weights  ? 08/08/2021 2021  ?Weight: 81.6 kg  ? ? ?Examination: ?General: No acute distress ?HENT: Atraumatic/normocephalic mucous membranes are moist.  No thrush. ?Lungs: Clear to auscultation bilaterally.  No wheezing rales or rhonchi noted. ?Cardiovascular: Tachycardic, no rub murmur gallop appreciated. ?Abdomen: Soft, nontender, nondistended, no rebound/rigidity/guarding.  Positive bowel sounds. ?Extremities:  No edema of the lower extremities.  Distal pulses are weakly present x4 extremities.  Temperature is uniform across all extremities proximal and distal.  The right great toe on the dorsal surface has a 1.5 x 1.5 area of eschar.  No significant erythema.  There is no change in pallor. ?Neuro: Conscious alert and orient x3.  Right upper extremity 4 out of 5 strength right lower extremity 3 out of 5 strength left upper and lower extremities 5 out of 5 strength.  This is chronic. ?Skin: No rash ? ? ?Assessment & Plan:  ?Acute coronary syndrome with NSTEMI ?HFrEF EF estimated 10 to 15% ?Right great toe ulceration with eschar in place ?Uncontrolled diabetes mellitus ?Hypertension ?Wide-complex tachycardia ? ?Plan: ?Cardiovascular: ?Admit Dr. Maryellen Pile at bedside.  He completed an echocardiogram which demonstrated ejection fraction of 10 to 15%. ?We will start milrinone 0.125 mics per KG per minute. ?Continue heparin and nitro infusions. ?Amiodarone was discontinued per cardiology service. ?Complete echo to be repeated in a.m. ?Continue aspirin ? ?Pulmonary: ?Patient is currently on nasal cannula.  Oxygen saturations are good. ? ?Endocrine: ?Blood sugars are out of control with his most recent blood sugars in the 500s.  His anion gap is 14. ?We will place the patient on insulin infusion. ?Hold Jardiance and long-acting insulin ?Continue blood sugar monitoring per Endo tool protocol. ? ?GI: ?N.p.o. except for sips and chips. ?Once blood sugars under better control with resume p.o. diet assuming that he is otherwise hemodynamically stable. ? ?Renal: ?Replete electrolytes per protocol. ?Monitor I's/O's. ? ?ID: ?Patient  was started on vancomycin and aztreonam. ?Blood cultures are pending. ?Send lactic acid and procalcitonin levels. ? ?Family: ?Updated patient's son at bedside.  Answered all questions. ? ?Best Practice (right click and "Reselect all SmartList Selections" daily)  ? ?Diet/type: NPO w/ oral meds ?DVT prophylaxis:  systemic heparin ?GI prophylaxis: PPI ?Lines: N/A ?Foley:  N/A ?Code Status:  full code ?Last date of multidisciplinary goals of care discussion []  ? ?Labs   ?CBC: ?Recent Labs  ?Lab 07/19/21 ?2310 08/10/2021 ?2029 07/25/2021 ?2030  ?WBC 11.5*  --  15.2*  ?NEUTROABS 8.0*  --   --   ?HGB 15.5 15.6 15.3  ?HCT 45.4 46.0 44.7  ?MCV 85.0  --  86.3  ?PLT 323  --  306  ? ? ?Basic Metabolic Panel: ?Recent Labs  ?Lab 07/19/21 ?2310 08/15/2021 ?2029 08/05/2021 ?2030  ?NA 131* 129* 129*  ?K 3.8 5.3* 5.1  ?CL 97* 101 97*  ?CO2 26  --  18*  ?GLUCOSE 272* 535* 529*  ?BUN 23* 31* 23*  ?CREATININE 1.00 1.20  1.20 1.39*  ?CALCIUM 10.1  --  9.3  ? ?GFR: ?Estimated Creatinine Clearance: 64.3 mL/min (A) (by C-G formula based on SCr of 1.39 mg/dL (H)). ?Recent Labs  ?Lab 07/19/21 ?2310 08/19/2021 ?2030  ?WBC 11.5* 15.2*  ? ? ?Liver Function Tests: ?Recent Labs  ?Lab 07/19/21 ?2310 08/03/2021 ?2030  ?AST 40 54*  ?ALT 41 35  ?ALKPHOS 97 81  ?BILITOT 0.5 1.5*  ?PROT 8.5* 7.3  ?ALBUMIN 3.9 2.8*  ? ?No results for input(s): LIPASE, AMYLASE in the last 168 hours. ?No results for input(s): AMMONIA in the last 168 hours. ? ?ABG ?   ?Component Value Date/Time  ? TCO2 20 (L) 08/04/2021 2029  ?  ? ?Coagulation Profile: ?No results for input(s): INR, PROTIME in the last 168 hours. ? ?Cardiac Enzymes: ?No results for input(s): CKTOTAL, CKMB, CKMBINDEX, TROPONINI in the last 168 hours. ? ?HbA1C: ?Hemoglobin A1C  ?Date/Time Value Ref Range Status  ?03/12/2017 12:00 AM 9.0  Final  ?03/12/2017 12:00 AM 9.0  Final  ? ?Hgb A1c MFr Bld  ?Date/Time Value Ref Range Status  ?01/09/2021 01:40 PM 10.9 (H) 4.8 - 5.6 % Final  ?  Comment:  ?  (NOTE) ?Pre diabetes:          5.7%-6.4% ? ?Diabetes:              >6.4% ? ?Glycemic control for   <7.0% ?adults with diabetes ?  ?01/23/2020 02:38 PM 10.1 (H) 4.8 - 5.6 % Final  ?  Comment:  ?  (NOTE) ?Pre diabetes:          5.7%-6.4% ? ?Diabetes:              >6.4% ? ?Glycemic control for   <7.0% ?adults with diabetes ?   ? ? ?CBG: ?Recent Labs  ?Lab 08/19/2021 ?0006  ?GLUCAP 531*  ? ? ?Review of Systems:   ?10 point review of systems completed.  Please see HPI ? ?Past Medical History:  ?He,  has a past medical history of Diabetes mellitus without complication ([place]), Hypertension, and Stroke ([place]).  ? ?Surgical History:  ? ?Past Surgical History:  ?Procedure Laterality Date  ? APPENDECTOMY    ? BACK SURGERY    ? CORONARY/GRAFT ACUTE MI REVASCULARIZATION N/A 01/23/2020  ? Procedure: Coronary/Graft Acute MI Revascularization;  Surgeon: Yolonda Kida, MD;  Location: [place] [facility];  Service: Cardiovascular;  Laterality: N/A;  ? LEFT HEART CATH AND CORONARY ANGIOGRAPHY  N/A 01/23/2020  ? Procedure: LEFT HEART CATH AND CORONARY ANGIOGRAPHY;  Surgeon: Yolonda Kida, MD;  Location: [place] [facility];  Service: Cardiovascular;  Laterality: N/A;  ?  ? ?Social History:  ? reports that he has been smoking cigarettes. He has been smoking an average of .25 packs per day. He has never used smokeless tobacco. He reports that he does not drink alcohol and does not use drugs.  ? ?Family History:  ?His family history includes Diabetes in his father; Heart failure in his father and mother; Stroke in his mother.  ? ?Allergies ?Allergies  ?Allergen Reactions  ? Penicillins Other (See Comments) and Anaphylaxis  ?  Has patient had a PCN reaction causing immediate rash, facial/tongue/throat swelling, SOB or lightheadedness with hypotension: Yes ?Has patient had a PCN reaction causing severe rash involving mucus membranes or skin necrosis: No ?Has patient had a PCN reaction that required hospitalization: No ?Has patient had a PCN reaction occurring within the last 10 years: No ?If all of the above answers are "NO", then may proceed with Cephalosporin use. ?  ? Empagliflozin   ?  Other reaction(s): Urinary tract infectious disease  ? Liraglutide Nausea And Vomiting  ? Penicillin G Rash and Hives  ?  ? ?Home Medications  ?Prior to Admission  medications   ?Medication Sig Start Date End Date Taking? Authorizing Provider  ?alum & mag hydroxide-simeth (MAALOX/MYLANTA) 200-200-20 MG/5ML suspension Take 30 mLs by mouth every 6 (six) hours as needed for indigestion

## 2021-07-24 NOTE — Progress Notes (Addendum)
eLink Physician-Brief Progress Note ?Patient Name: [name] [name] ?DOB: [birth year] ?MRN: [id number] ? ? ?Date of Service ? 08-08-21  ?HPI/Events of Note ? LHC-AIBP. Poor prognosis per cards. ? ?K 2.8, Cr1.76 ?LA 2.3 ?  ?eICU Interventions ? K replacement. Follow LA.   ? ? ? ?Intervention Category ?Minor Interventions: Electrolytes abnormality - evaluation and management ? ?Ranee Gosselin ?2021/08/08, 7:51 PM ? ?8:34 ? ?Requesting art line: ?LA going up.  ?Not able to get labs. On AIBP-milrinone/dobutamine, levo . Insuline gtt.  ?Sepsis. Cellulitis ? ?Full code as per RN discussion. ? ?Camera eval done ?As above. ? ?- Left arm has a picc , rt wrist-s/p LHC. Art line to place on rt wrist. ? ? ?01:12 ?K 3.2, mag at 1.7, Cr 1.6 ?Replcement ordering. ?Cardiology notes reviewed. ? ? ?

## 2021-07-24 NOTE — CV Procedure (Signed)
Left and right heart cath, coronary angiography, PCI, and intra-aortic balloon pump insertion (left femoral). ?Total occlusion of ostial LAD treated with a 3.0 x 12 mm Onyx stent postdilated to 3.5 mmHg at high pressure.  Total occlusion of the distal LAD.[name] [name] ?Chronic total occlusion of the ostial ramus intermedius which fills by left to left collaterals. ?Circumflex is patent ?Left main is patent ?RCA with nondominant anatomy ?LVEDP 24 mmHg ?Mean right atrial pressure 6 mmHg; pulmonary artery pressure 25 over 13 mmHg with mean PA pressure 17; main PA sat 58%; arterial saturation 98%; cardiac output 3.38 L/min with index of 1.71 L/min/m? ?Discussed with advanced heart failure team, Dr. Shirlee Latch, and at end of case decided to place an intra-aortic balloon pump rather than Impella.  This was performed from the left femoral approach since the patient has a nonhealing wound on the right lower extremity/great toe. ?

## 2021-07-24 NOTE — Progress Notes (Addendum)
[id number] Latch MD called this RN in regards to patient's status post-IABP placement. Discussed swan numbers, drips, mottled lower extremities, etc. Orders received to increase milrinone to 0.375 and draw another co-ox. ? ?1547: Spoke with Shirlee Latch MD about co-ox results, drips, fever, and mottled lower extremities. Order received to increase milrinone to 0.5. ? ?1800: Chandrasekhar MD at bedside. Orders received to give 60 mg IV lasix once catheter placed by urology and to start dobutamine at 2.5 if co-ox result less than 65%. ? ?1807: Called by Shirlee Latch MD. Orders received to send STAT lactic acid and BMET, hold 60 mg IV lasix if CVP < 10, and to start dobutamine 2.5 if co-ox less than 60%.  ?

## 2021-07-24 NOTE — Progress Notes (Signed)
eLink Physician-Brief Progress Note ?Patient Name: [name] [name] ?DOB: April 16, 1971 ?MRN: [id number] ? ? ?Date of Service ? 08/21/2021  ?HPI/Events of Note ? Troponin 18,731 from 3,374  ?eICU Interventions ? Pt admitted for NSTEMI, currently hemodynamically stable and chest pain free.  ?On dual antiplatelets, heparin.  ?Maintain on cardiac monitoring.  ?Follow up echo planned for today.  ?Will follow further cardiology plans.   ? ? ? ?  ? ?Thurnell Garbe CRUZ ?08/02/2021, 4:58 AM ?

## 2021-07-24 NOTE — Progress Notes (Signed)
Patient with progressive chest pain and SOB. McLean MD at bedside. Nitroglycerin gtt increased, oxygen increased to 8 L. PICC placed, co-ox sent. Patient intubated in preparation for cath procedure. 2 mg versed, 25 mcg fentanyl, 20 mg etomidate, and 100 mg rocuronium given during intubation. This RN and another RN unable to place foley x 3 attempts as well as OGT. Order placed for coude catheter. Chest x-ray obtained for ETT placement and patient emergently taken to cath lab. Milrinone and insulin gtts infusing at that time. ?

## 2021-07-24 NOTE — Consult Note (Signed)
?  ?Advanced Heart Failure Team Consult Note ? ? ?Primary Physician: [place] ?PCP-Cardiologist:  None ? ?Reason for Consultation: MI, cardiogenic shock ? ?HPI:   ? ?[name] [name] is seen today for evaluation of MI, cardiogenic shock at the request of Dr. Dwyane Dee.  Patient has poorly controlled DM with HgbA1c this admission 11.8.  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  Prior anterior MI in 10/21 with occluded distal LAD, treated with PTCA.  Echo that time showed EF 65% with apical akinesis and an apical thrombus.   ? ?Patient was seen 3 days ago by his physician for a diabetic right foot ulceration.  He was started on doxycycline.  He says that he started having chest pain at that time that progressively worsened.  He came to the ER yesterday pm.  He was noted to have a wide complex tachycardia that slowed to show NSR with IVCD but also with anterior MI pattern that was new compared to 9/22.  There was concern for sepsis from the diabetic foot ulcer and aztreonam/vancomycin was started.  PCT, howevever, was only 0.31.  HS-TnI was 2234 => 18,731.  Chest pain was initially 10/10, improved significantly with IV NTG, but now is back to about 5/10 with NTG gtt 40. He was started on heparin gtt and Plavix was switched to Brilinta.  Narrow pulse pressure with cool extremities noted, he was started on milrinone 0.125 then increased to 0.25 this morning.  PICC just placed.  Creatinine up to 1.54 this morning.  Blood glucose in 500s, insulin gtt has been started.  ? ?Echo showed EF <20% with apical and peri-apical akinesis, no LV thrombus, normal RV size and systolic function, normal IVC. ? ?Review of Systems: All systems reviewed and negative except as per HPI.  ? ?Home Medications ?Prior to Admission medications   ?Medication Sig Start Date End Date Taking? Authorizing Provider  ?alum & mag hydroxide-simeth (MAALOX/MYLANTA) 200-200-20  MG/5ML suspension Take 30 mLs by mouth every 6 (six) hours as needed for indigestion or heartburn. 01/17/21   Pokhrel, Corrie Mckusick, MD  ?amLODipine (NORVASC) 10 MG tablet Take 1 tablet by mouth daily. 12/10/20   [provider]  ?aspirin 81 MG EC tablet Take 81 mg by mouth daily. 10/18/20   [provider]  ?atorvastatin (LIPITOR) 80 MG tablet Take 80 mg by mouth daily.    [provider]  ?buPROPion (WELLBUTRIN XL) 150 MG 24 hr tablet Take 150 mg by mouth daily.    [provider]  ?carvedilol (COREG) 25 MG tablet Take 25 mg by mouth in the morning and at bedtime. 07/08/20   [provider]  ?chlorthalidone (HYGROTON) 25 MG tablet Take 25 mg by mouth daily. 12/07/20   [provider]  ?citalopram (CELEXA) 40 MG tablet Take 40 mg by mouth daily.    [provider]  ?clopidogrel (PLAVIX) 75 MG tablet Take 75 mg by mouth daily. 10/18/20   [provider]  ?dicyclomine (BENTYL) 10 MG capsule Take 1 capsule (10 mg total) by mouth 4 (four) times daily -  before meals and at bedtime for 5 days. 01/17/21 01/22/21  Pokhrel, Corrie Mckusick, MD  ?doxycycline (VIBRAMYCIN) 100 MG capsule Take 1 capsule (100 mg total) by mouth 2 (two) times daily. 07/20/21   Petrucelli, Glynda Jaeger, PA-C  ?Empagliflozin (JARDIANCE PO) Take by mouth. Pt reports taking 1/2 pill per day; not sure of dose    [provider]  ?folic acid (FOLVITE) 1 MG tablet Take 1 mg by mouth daily.    [provider]  ?insulin aspart (NOVOLOG) 100 UNIT/ML injection Inject 5 Units into the skin 3 (three) times daily with meals. ?Patient taking differently: Inject 10 Units into the skin 3 (three) times daily with meals. 01/25/20 02/19/21  Enzo Bi, MD  ?insulin glargine (LANTUS) 100 UNIT/ML injection Inject 40 Units into the skin at bedtime.    [provider]  ?isosorbide mononitrate (IMDUR) 30 MG 24 hr tablet Take 1 tablet (30 mg total) by mouth daily. 01/18/21   Pokhrel, Corrie Mckusick, MD   ?losartan (COZAAR) 100 MG tablet Take 100 mg by mouth every evening.    [provider]  ?metoCLOPramide (REGLAN) 10 MG tablet Take 1 tablet (10 mg total) by mouth every 12 (twelve) hours as needed for nausea. 01/02/21   Henderly, Britni A, PA-C  ?Multiple Vitamins-Minerals (MULTIVITAMIN ADULT) TABS Take 1 tablet by mouth daily.    [provider]  ?pantoprazole (PROTONIX) 20 MG tablet Take 1 tablet (20 mg total) by mouth daily. 01/02/21   Henderly, Britni A, PA-C  ?simethicone (MYLICON) 80 MG chewable tablet Chew 2 tablets (160 mg total) by mouth 4 (four) times daily as needed for flatulence. 01/17/21   Pokhrel, Corrie Mckusick, MD  ?tamsulosin (FLOMAX) 0.4 MG CAPS capsule Take 0.4 mg by mouth daily.    [provider]  ? ? ?Past Medical History: ?1. Type 2 diabetes: Poor control. ?2. HTN ?3. H/o CVA: right-sided weakness, wheelchair-bound.  ?4. CAD: s/p late presentation anterior MI in 10/21, had PTCA distal LAD.  Echo at the time showed apical akinesis with thrombus but LV EF 65%.  ?5. H/o LV thrombus in 10/21.  ? ?Past Surgical History: ?Past Surgical History:  ?Procedure Laterality Date  ? APPENDECTOMY    ? BACK SURGERY    ? CORONARY/GRAFT ACUTE MI REVASCULARIZATION N/A 01/23/2020  ? Procedure: Coronary/Graft Acute MI Revascularization;  Surgeon: Yolonda Kida, MD;  Location: [facility];  Service: Cardiovascular;  Laterality: N/A;  ? LEFT HEART CATH AND CORONARY ANGIOGRAPHY N/A 01/23/2020  ? Procedure: LEFT HEART CATH AND CORONARY ANGIOGRAPHY;  Surgeon: Yolonda Kida, MD;  Location: [facility];  Service: Cardiovascular;  Laterality: N/A;  ? ? ?Family History: ?Family History  ?Problem Relation Age of Onset  ? Stroke Mother   ? Heart failure Mother   ? Heart failure Father   ? Diabetes Father   ? ? ?Social History: ?Social History  ? ?Socioeconomic History  ? Marital status: Divorced  ?  Spouse name: Not on file  ? Number of children: Not on file  ? Years of education:  Not on file  ? Highest education level: Not on file  ?Occupational History  ? Not on file  ?Tobacco Use  ? Smoking status: Every Day  ?  Packs/day: 0.25  ?  Types: Cigarettes  ? Smokeless tobacco: Never  ?Vaping Use  ? Vaping Use: Never used  ?Substance and Sexual Activity  ? Alcohol use: No  ? Drug use: No  ? Sexual activity: Not on file  ?Other Topics Concern  ? Not on file  ?Social History Narrative  ? Not on file  ? ?Social Determinants of Health  ? ?Financial Resource Strain: Not on file  ?Food Insecurity: Not on file  ?Transportation Needs: Not on file  ?Physical Activity: Not on file  ?Stress: Not on file  ?Social Connections: Not on file  ? ? ?  Allergies:  ?Allergies  ?Allergen Reactions  ? Penicillins Other (See Comments) and Anaphylaxis  ?  Has patient had a PCN reaction causing immediate rash, facial/tongue/throat swelling, SOB or lightheadedness with hypotension: Yes ?Has patient had a PCN reaction causing severe rash involving mucus membranes or skin necrosis: No ?Has patient had a PCN reaction that required hospitalization: No ?Has patient had a PCN reaction occurring within the last 10 years: No ?If all of the above answers are "NO", then may proceed with Cephalosporin use. ?  ? Empagliflozin   ?  Other reaction(s): Urinary tract infectious disease  ? Liraglutide Nausea And Vomiting  ? Penicillin G Rash and Hives  ? ? ?Objective:   ? ?Vital Signs:   ?Temp:  [98.1 ?F (36.7 ?C)] 98.1 ?F (36.7 ?C) (04/02 0352) ?Pulse Rate:  [93-128] 105 (04/02 0830) ?Resp:  [12-34] 14 (04/02 0830) ?BP: (104-132)/(80-108) 132/108 (04/02 0830) ?SpO2:  [91 %-98 %] 95 % (04/02 0830) ?Weight:  [81.6 kg] 81.6 kg (04/01 2021) ?Last BM Date : 08/13/2021 ? ?Weight change: ?Filed Weights  ? 08/19/2021 2021  ?Weight: 81.6 kg  ? ? ?Intake/Output:  ? ?Intake/Output Summary (Last 24 hours) at 08/14/2021 0951 ?Last data filed at 08/08/2021 0800 ?Gross per 24 hour  ?Intake 1076.28 ml  ?Output --  ?Net 1076.28 ml  ?  ? ? ?Physical Exam  ?   ?General:  Well appearing. No resp difficulty ?HEENT: normal ?Neck: supple. JVP . Carotids 2+ bilat; no bruits. No lymphadenopathy or thyromegaly appreciated. ?Cor: PMI nondisplaced. Regular rate & rhythm. No rubs,

## 2021-07-24 NOTE — Progress Notes (Signed)
Writer and another nurse both attempted to place a 59fr coude catheter in this patient. The urethra appeared so constricted that the catheter would advance less than 1 cm before stopping completely. Care nurse was advised to notify the urologist. ?

## 2021-07-24 NOTE — Progress Notes (Signed)
Inpatient Diabetes Program Recommendations ? ?AACE/ADA: New Consensus Statement on Inpatient Glycemic Control (2015) ? ?Target Ranges:  Prepandial:   less than 140 mg/dL ?     Peak postprandial:   less than 180 mg/dL (1-2 hours) ?     Critically ill patients:  140 - 180 mg/dL  ? ? Latest Reference Range & Units 08/09/2021 20:30  ?Sodium 135 - 145 mmol/L 129 (L)  ?Potassium 3.5 - 5.1 mmol/L 5.1  ?Chloride 98 - 111 mmol/L 97 (L)  ?CO2 22 - 32 mmol/L 18 (L)  ?Glucose 70 - 99 mg/dL 962 (HH)  ?BUN 6 - 20 mg/dL 23 (H)  ?Creatinine 0.61 - 1.24 mg/dL 9.52 (H)  ?Calcium 8.9 - 10.3 mg/dL 9.3  ?Anion gap 5 - 15  14  ? ? Latest Reference Range & Units 2021-08-09 01:36 08-09-2021 02:34 2021-08-09 03:46 08-09-21 04:55 Aug 09, 2021 05:38 08/09/2021 06:37 2021-08-09 08:13 08-09-21 10:31 Aug 09, 2021 12:34  ?Glucose-Capillary 70 - 99 mg/dL 841 (H) 324 (H) 401 (H) 241 (H) 187 (H) 171 (H) 159 (H) 153 (H) 215 (H)  ?(H): Data is abnormally high ? ? ?Admit with:  ?Acute coronary syndrome with non-ST elevation myocardial infarction ?Severe sepsis secondary to diabetic foot ulcer ? ?History: DM ? ?Home DM Meds: Jardiance Daily ?       Novolog 10 units TID with meals ?       Lantus 40 units QHS ? ?Current Orders: IV Insulin Drip ? ? ? ? ?Intubated this AM prior to going to Cath Lab ? ?Given current status, recommend leaving pt on the IV Insulin Drip until his condition is more stable ? ? ? ?--Will follow patient during hospitalization-- ? ?Ambrose Finland RN, MSN, CDE ?Diabetes Coordinator ?Inpatient Glycemic Control Team ?Team Pager: [phone] (8a-5p) ? ? ? ?

## 2021-07-24 NOTE — Progress Notes (Addendum)
? ?Progress Note ? ?Patient Name: [name] [name] ?Date of Encounter: 07/25/2021 ? ?Primary Cardiologist: Jefm Bryant ? ?Subjective  ? ?Since ICU eval, troponin has continued to rise. 2K,-> 3K-> 18 K. ?Creatinine has increased 1.2-> 1.39-. 1.54 ?Albumin 2.8 but no DKA ?BNP is elevated; normal Lactate ?WBC is persistently elevated ?He has a narrow pulse pressure ? ?He has 1/10 chest pain that is much improved.  ?No shortness of breath. ?No palpitations  ? ?Notes that he was doing fine until went to [facility] earlier this week for antibiotics for his foot.  Notes that he has spiraled downhill since then. ? ?Inpatient Medications  ?  ?Scheduled Meds: ? amLODipine  10 mg Oral Daily  ? aspirin  324 mg Oral NOW  ? Or  ? aspirin  300 mg Rectal NOW  ? aspirin EC  81 mg Oral Daily  ? atorvastatin  80 mg Oral Daily  ? buPROPion  150 mg Oral Daily  ? carvedilol  25 mg Oral BID WC  ? folic acid  1 mg Oral Daily  ? isosorbide mononitrate  30 mg Oral Daily  ? losartan  100 mg Oral QPM  ? multivitamin with minerals  1 tablet Oral Daily  ? pantoprazole (PROTONIX) IV  40 mg Intravenous Q24H  ? tamsulosin  0.4 mg Oral Daily  ? ?Continuous Infusions: ? amiodarone    ? aztreonam Stopped (07/26/2021 0317)  ? dextrose 5% lactated ringers 125 mL/hr at 07/25/2021 0600  ? heparin 1,000 Units/hr (08/06/2021 0600)  ? insulin 8.5 Units/hr (08/20/2021 0600)  ? lactated ringers Stopped (08/20/2021 0034)  ? milrinone 0.125 mcg/kg/min (08/10/2021 0600)  ? nitroGLYCERIN 15 mcg/min (07/29/2021 0600)  ? vancomycin Stopped (08/18/2021 0236)  ? ?PRN Meds: ?acetaminophen, alum & mag hydroxide-simeth, dextrose, metoprolol tartrate, nitroGLYCERIN, nitroGLYCERIN, ondansetron (ZOFRAN) IV, simethicone  ? ?Vital Signs  ?  ?Vitals:  ? 08/16/2021 0352 08/10/2021 0400 07/23/2021 0500 08/20/2021 0600  ?BP:  (!) 118/100 (!) 130/108 (!) 129/102  ?Pulse:  93 96 100  ?Resp:  (!) 22 (!) 34 (!) 23  ?Temp: 98.1 ?F (36.7 ?C)     ?TempSrc: Axillary     ?SpO2:  93% 95% 94%  ?Weight:      ?Height:       ? ? ?Intake/Output Summary (Last 24 hours) at 08/03/2021 0730 ?Last data filed at 08/12/2021 0600 ?Gross per 24 hour  ?Intake 782.28 ml  ?Output --  ?Net 782.28 ml  ? ?Filed Weights  ? 08/18/2021 2021  ?Weight: 81.6 kg  ? ? ?Telemetry  ?  ?Sinus tachycardia - Personally Reviewed ? ?ECG  ?  ?08/07/2021 SR Rate 94 RBBB and anterior STEMI pattern - Personally Reviewed ? ?Physical Exam  ? ?Gen: no distress ?Neck: Difficult JVD exam thick neck ?Cardiac: No Rubs or Gallops, no Murmur, RRR + radial and femoral radial pulses ?Respiratory: Clear to auscultation bilaterally,  normal effort, normal  respiratory rate ?GI: Soft, nontender, non-distended  ?MS: No  edema;  moves all extremities ?Integument: No sacral ulcer, posterior ICD pad removed, black eschars on his foot present on admission ?Neuro:  At time of evaluation, alert and oriented to person/place/time/situation  ?Psych: Normal affect, patient feels better ? ? ?Labs  ?  ?Chemistry ?Recent Labs  ?Lab 07/19/21 ?2310 07/29/2021 ?2029 07/26/2021 ?2030 08/13/2021 ?0017  ?NA 131* 129* 129* 131*  ?K 3.8 5.3* 5.1 4.7  ?CL 97* 101 97* 96*  ?CO2 26  --  18* 24  ?GLUCOSE 272* 535* 529* 519*  ?BUN  23* 31* 23* 26*  ?CREATININE 1.00 1.20  1.20 1.39* 1.54*  ?CALCIUM 10.1  --  9.3 9.9  ?PROT 8.5*  --  7.3  --   ?ALBUMIN 3.9  --  2.8*  --   ?AST 40  --  54*  --   ?ALT 41  --  35  --   ?ALKPHOS 97  --  81  --   ?BILITOT 0.5  --  1.5*  --   ?GFRNONAA >60  --  >60 55*  ?ANIONGAP 8  --  14 11  ?  ? ?Hematology ?Recent Labs  ?Lab 07/31/2021 ?2030 08/04/2021 ?0017 08/02/2021 ?0350  ?WBC 15.2* 16.6* 16.6*  ?RBC 5.18 5.30 4.88  ?HGB 15.3 15.3 14.4  ?HCT 44.7 45.3 41.5  ?MCV 86.3 85.5 85.0  ?MCH 29.5 28.9 29.5  ?MCHC 34.2 33.8 34.7  ?RDW 11.7 11.8 11.7  ?PLT 306 358 276  ? ? ?Cardiac EnzymesNo results for input(s): TROPONINI in the last 168 hours. No results for input(s): TROPIPOC in the last 168 hours.  ? ?BNP ?Recent Labs  ?Lab 07/31/2021 ?0017  ?BNP 431.0*  ?  ? ?DDimer  ?Recent Labs  ?Lab 08/21/2021 ?2030   ?DDIMER 2.08*  ?  ? ?Radiology  ?  ?DG Chest Port 1 View ? ?Result Date: 08/03/2021 ?CLINICAL DATA:  Pain. EXAM: PORTABLE CHEST 1 VIEW COMPARISON:  Chest x-ray 01/10/2021 FINDINGS: The heart size and mediastinal contours are within normal limits. Both lungs are clear. The visualized skeletal structures are unremarkable. IMPRESSION: No active disease. Electronically Signed   By: Ronney Asters M.D.   On: 07/29/2021 20:53   ? ? ?Patient Profile  ?   ?51 y.o. male NSTEMI and cardiogenic shock ? ?Assessment & Plan  ? ?Cardiogenic Shock ?NSTEMI ?WCT NOS in the ED ?Hx of left ventricular thrombus ?- He was loaded with ASA and Brilinta; continue and continue heparin  ?- Will increase milrinone to 0.25 mcg ?- Continue amiodarone drip for now, especially with increase in inotropes ?- Echo pending this AM ?- Consulted AHF ?- I have held is beta blocker ?- Continue atorvastatin 80 ?- I have continued in IV nitro for afterload reduction but may wean this has milrinone increases ?- will stop IMDUR ?- Needs IV access, CVP monitoring and Venous CO-Ox; getting PICC ? ? ?Diabetic foot ulcer ?Severe Sepsis ?- Appreciate PCCM recs, Bcx pending ?- greatly improves with insulin drip ?- would consult for foot wounds, continue ABX as recommended by PCCM ? ?Full Code ?Ordering screening labs ? ? ?CRITICAL CARE ?Performed by: Murray Guzzetta A Lucresia Simic ? ?Total critical care time: 40 minutes. Critical care time was exclusive of separately billable procedures and treating other patients. Critical care was necessary to treat or prevent imminent or life-threatening deterioration. Critical care was time spent personally by me on the following activities: development of treatment plan with patient and/or surrogate as well as nursing, discussions with consultants, evaluation of patient's response to treatment, examination of patient, obtaining history from patient or surrogate, ordering and performing treatments and interventions, ordering and review  of laboratory studies, ordering and review of radiographic studies, pulse oximetry and re-evaluation of patient's condition. ? ? ? ?Signed, ?Rudean Haskell, MD ?[place]  ?08/17/2021 8:06 AM  ? ? ?For questions or updates, please contact [place] ?Please consult [email or web address] for contact info under Cardiology/STEMI. ?  ?   ?Signed, ?Werner Lean, MD  ?08/14/2021, 7:30 AM   ? ?Chest pain has returned. ?LV function <  20% ?No LV thrombus. ?More short of breath. ?Seen by AHF and discussed with today's IC- need LHC. ?I do not predict he is a CABG Candidate ?May need support device  ?Venous Cox is pending. ?NPO for procedure today ? ?Rudean Haskell, MD ?Cardiologist ?[facility]  ?[place], [place] ?[place],  [address] ?[phone]  ?10:24 AM ? ? ?

## 2021-07-24 NOTE — Procedures (Signed)
Arterial Catheter Insertion Procedure Note ? ?[name] [name]  ?[id number]  ?[birth year] ? ?Date:08-09-21  ?Time:9:33 PM  ? ? ?Provider Performing: Benjamine Sprague, BS, RRT-ACCS, RCP ? ? ?Procedure: Insertion of Arterial Line (51025) without US guidance ? ?Indication(s) ?Blood pressure monitoring and/or need for frequent ABGs ? ?Consent ?Unable to obtain consent due to emergent nature of procedure. ? ?Anesthesia ?None ? ? ?Time Out ?Verified patient identification, verified procedure, site/side was marked, verified correct patient position, special equipment/implants available, medications/allergies/relevant history reviewed, required imaging and test results available. ? ? ?Sterile Technique ?Maximal sterile technique including full sterile barrier drape, hand hygiene, sterile gown, sterile gloves, mask, hair covering, sterile ultrasound probe cover (if used). ? ? ?Procedure Description ?Area of catheter insertion was cleaned with chlorhexidine and draped in sterile fashion. Without real-time ultrasound guidance an arterial catheter was placed into the left radial arterial lumen.  Appropriate arterial tracings confirmed on monitor. Arterial line flushes and draw blood back without complications. No hematoma noted on the insertion site.  ? ? ?Complications/Tolerance ?None; patient tolerated the procedure well. ? ? ?EBL ?Minimal ? ? ?[name] [name], BS, RRT-ACCS, RCP ? ? ? ?

## 2021-07-24 NOTE — Progress Notes (Signed)
Pt transported from the cath lab to 2H 21 on the ventilator without incident. ?

## 2021-07-24 NOTE — Progress Notes (Signed)
ANTICOAGULATION CONSULT NOTE  ? ?Pharmacy Consult for heparin ?Indication: chest pain/ACS ? ?Allergies  ?Allergen Reactions  ? Penicillins Other (See Comments) and Anaphylaxis  ?  Has patient had a PCN reaction causing immediate rash, facial/tongue/throat swelling, SOB or lightheadedness with hypotension: Yes ?Has patient had a PCN reaction causing severe rash involving mucus membranes or skin necrosis: No ?Has patient had a PCN reaction that required hospitalization: No ?Has patient had a PCN reaction occurring within the last 10 years: No ?If all of the above answers are "NO", then may proceed with Cephalosporin use. ?  ? Empagliflozin   ?  Other reaction(s): Urinary tract infectious disease  ? Liraglutide Nausea And Vomiting  ? Penicillin G Rash and Hives  ? ? ?Patient Measurements: ?Height: 5\' 9"  (175.3 cm) ?Weight: 81.6 kg (180 lb) ?IBW/kg (Calculated) : 70.7 ?Heparin Dosing Weight: TBW ? ?Vital Signs: ?Temp: 98.1 ?F (36.7 ?C) (04/02 0352) ?Temp Source: Axillary (04/02 0352) ?BP: 124/50 (04/02 1322) ?Pulse Rate: 109 (04/02 1000) ? ?Labs: ?Recent Labs  ?  08/07/21 ?2029 08-07-2021 ?2030 08/09/2021 ?0017 08/19/2021 ?09/23/21 08/04/2021 ?0350 08/15/2021 ?1234 08/14/2021 ?1242  ?HGB 15.6 15.3 15.3  --  14.4 12.9* 13.3  ?HCT 46.0 44.7 45.3  --  41.5 38.0* 39.0  ?PLT  --  306 358  --  276  --   --   ?HEPARINUNFRC  --   --  0.43  --  <0.10*  --   --   ?CREATININE 1.20  1.20 1.39* 1.54*  --   --   --   --   ?TROPONINIHS  --  2,234* 3,77401-16-1990*  --   --   --   ? ? ? ?Estimated Creatinine Clearance: 58 mL/min (A) (by C-G formula based on SCr of 1.54 mg/dL (H)). ? ? ?Assessment: ?55 YOM presenting with CP, SOB over last few days, elevated troponin.  He is not on anticoagulation PTA, CBC wnl. ? ?He is noted with HF (EF < 20%) and cardiogenic shock s/p cath with stent placed and IABP in place.  Heparin to restart post cath ?-Heparin 54 units IV given in cath (last dose ~ 1:30pm) ?-heparin was previously at goal on 1000  units/hr ? ?Goal of Therapy:  ?Heparin level= 0.2-0.5 ?Monitor platelets by anticoagulation protocol: Yes ?  ?Plan:  ?-restart heparin at 800 units/hr (lower rate due to recent heparin boluses) ?-Heparin level in 8 hours and daily wth CBC daily ? ?[id number], PharmD ?Clinical Pharmacist ?**Pharmacist phone directory can now be found on [email or web address] ([email or web address]).  Listed under [facility]. ? ? ? ? ? ?

## 2021-07-24 NOTE — Procedures (Signed)
Intubation Procedure Note ? ?[name] [name]  ?[id number]  ?10-Jun-1970 ? ?Date:08/19/2021  ?Time:10:51 AM  ? ?Provider Performing:Daylah Sayavong E Mayetta Castleman  ? ? ?Procedure: Intubation (31500) ? ?Indication(s) ?Respiratory Failure ? ?Consent ?Risks of the procedure as well as the alternatives and risks of each were explained to the patient and/or caregiver.  Consent for the procedure was obtained and is signed in the bedside chart ? ? ?Anesthesia ?Etomidate, Versed, Fentanyl, and Rocuronium ? ? ?Time Out ?Verified patient identification, verified procedure, site/side was marked, verified correct patient position, special equipment/implants available, medications/allergies/relevant history reviewed, required imaging and test results available. ? ? ?Sterile Technique ?Usual hand hygeine, masks, and gloves were used ? ? ?Procedure Description ?Patient positioned in bed supine.  Sedation given as noted above.  Patient was intubated with endotracheal tube using Glidescope.  View was Grade 1 full glottis .  Number of attempts was 1.  Colorimetric CO2 detector was consistent with tracheal placement. ? ? ?Complications/Tolerance ?Small abrasion on R lip.  ?Chest X-ray is ordered to verify placement. ? ? ?EBL ?Minimal ? ? ?Specimen(s) ?None  ? ?Tessie Fass MSN, AGACNP-BC ?[facility] ?Amion for pager  ?08/16/2021, 10:52 AM ? ?

## 2021-07-24 NOTE — Plan of Care (Addendum)
Cardiology Care Plane of Care note ? ?Since last seen by a cardiology team  has not had significant urine output.  CI in 1.7 Sinus tachycardia rates 115.  No new leg wounds.  Milrinone has needed to be increase to 5 mcg.  Wit this levophed is up to 16 mcg. ? ?AHF has discussed care and course with son. ?I have discussed care and course with sister; daughter is coming up from [place] tomorrow. ? ?Patient is still intubated and sedated. ?Regular tachycardia ?Mechanical breath sounds ?1:1 IABP  ?No change in R foot black eschar ?Pulses and adequate cap refill L left (IABP site) ? ?Pending Foley. ?No further WCT ? ?Poor prognosis discussed with family; patient's father had MI in his 13s and passed away at [facility]. ? ?Repeating Venous Co-oX ?If < 60% would start dobutamine 2.5; may need to increase levophed based on this. ?Has had no further WCT from admission; amio Dc'ed by IV amio can be added if returns ?At 2000 and post Foley, will give 60 IV lasix (CVP 14) ? ?Likely not a candidate for AHF therapies (LVAD, OHT) so 5.5 Impella may not be a bridge to meaninful intervention. ? ?Reviewed with AHF and PCCM; will transition to AHF Service 08/09/2021. ? ?CRITICAL CARE ?Performed by: Retina Bernardy A Lovina Zuver ? ?Total critical care time: 30 minutes. Critical care time was exclusive of separately billable procedures and treating other patients. Critical care was necessary to treat or prevent imminent or life-threatening deterioration. Critical care was time spent personally by me on the following activities: development of treatment plan with patient and/or surrogate as well as nursing, discussions with consultants, evaluation of patient's response to treatment, examination of patient, obtaining history from patient or surrogate, ordering and performing treatments and interventions, ordering and review of laboratory studies, ordering and review of radiographic studies, pulse oximetry and re-evaluation of patient's  condition. ? ? ? ?Signed, ?Riley Lam, MD ?  [facility]  ?08/01/2021 6:11 PM  ? ?

## 2021-07-25 ENCOUNTER — Inpatient Hospital Stay (HOSPITAL_COMMUNITY): Payer: No Typology Code available for payment source | Admitting: Anesthesiology

## 2021-07-25 ENCOUNTER — Other Ambulatory Visit: Payer: Self-pay

## 2021-07-25 ENCOUNTER — Inpatient Hospital Stay (HOSPITAL_COMMUNITY): Payer: No Typology Code available for payment source

## 2021-07-25 ENCOUNTER — Encounter (HOSPITAL_COMMUNITY): Payer: Self-pay | Admitting: Interventional Cardiology

## 2021-07-25 ENCOUNTER — Encounter (HOSPITAL_COMMUNITY): Admission: EM | Disposition: E | Payer: Self-pay | Source: Home / Self Care | Attending: Cardiology

## 2021-07-25 DIAGNOSIS — I5023 Acute on chronic systolic (congestive) heart failure: Secondary | ICD-10-CM | POA: Diagnosis not present

## 2021-07-25 DIAGNOSIS — I2510 Atherosclerotic heart disease of native coronary artery without angina pectoris: Secondary | ICD-10-CM

## 2021-07-25 DIAGNOSIS — I110 Hypertensive heart disease with heart failure: Secondary | ICD-10-CM

## 2021-07-25 DIAGNOSIS — I249 Acute ischemic heart disease, unspecified: Secondary | ICD-10-CM

## 2021-07-25 DIAGNOSIS — I509 Heart failure, unspecified: Secondary | ICD-10-CM

## 2021-07-25 DIAGNOSIS — R570 Cardiogenic shock: Secondary | ICD-10-CM

## 2021-07-25 DIAGNOSIS — E11621 Type 2 diabetes mellitus with foot ulcer: Secondary | ICD-10-CM

## 2021-07-25 DIAGNOSIS — I219 Acute myocardial infarction, unspecified: Secondary | ICD-10-CM

## 2021-07-25 HISTORY — PX: PLACEMENT OF IMPELLA LEFT VENTRICULAR ASSIST DEVICE: SHX6519

## 2021-07-25 HISTORY — PX: TEE WITHOUT CARDIOVERSION: SHX5443

## 2021-07-25 LAB — CBC WITH DIFFERENTIAL/PLATELET
Abs Immature Granulocytes: 0.09 10*3/uL — ABNORMAL HIGH (ref 0.00–0.07)
Abs Immature Granulocytes: 0.1 10*3/uL — ABNORMAL HIGH (ref 0.00–0.07)
Abs Immature Granulocytes: 0.08 10*3/uL — ABNORMAL HIGH (ref 0.00–0.07)
Basophils Absolute: 0.1 10*3/uL (ref 0.0–0.1)
Basophils Absolute: 0.1 10*3/uL (ref 0.0–0.1)
Basophils Absolute: 0.1 10*3/uL (ref 0.0–0.1)
Basophils Relative: 0 %
Basophils Relative: 0 %
Basophils Relative: 0 %
Eosinophils Absolute: 0 10*3/uL (ref 0.0–0.5)
Eosinophils Absolute: 0 10*3/uL (ref 0.0–0.5)
Eosinophils Absolute: 0 10*3/uL (ref 0.0–0.5)
Eosinophils Relative: 0 %
Eosinophils Relative: 0 %
Eosinophils Relative: 0 %
HCT: 35.5 % — ABNORMAL LOW (ref 39.0–52.0)
HCT: 35.5 % — ABNORMAL LOW (ref 39.0–52.0)
HCT: 33.4 % — ABNORMAL LOW (ref 39.0–52.0)
Hemoglobin: 12.4 g/dL — ABNORMAL LOW (ref 13.0–17.0)
Hemoglobin: 12.1 g/dL — ABNORMAL LOW (ref 13.0–17.0)
Hemoglobin: 11.1 g/dL — ABNORMAL LOW (ref 13.0–17.0)
Immature Granulocytes: 1 %
Immature Granulocytes: 1 %
Immature Granulocytes: 1 %
Lymphocytes Relative: 11 %
Lymphocytes Relative: 13 %
Lymphocytes Relative: 14 %
Lymphs Abs: 2.2 10*3/uL (ref 0.7–4.0)
Lymphs Abs: 2.4 10*3/uL (ref 0.7–4.0)
Lymphs Abs: 2 10*3/uL (ref 0.7–4.0)
MCH: 29.4 pg (ref 26.0–34.0)
MCH: 28.8 pg (ref 26.0–34.0)
MCH: 28.1 pg (ref 26.0–34.0)
MCHC: 34.9 g/dL (ref 30.0–36.0)
MCHC: 34.1 g/dL (ref 30.0–36.0)
MCHC: 33.2 g/dL (ref 30.0–36.0)
MCV: 84.1 fL (ref 80.0–100.0)
MCV: 84.5 fL (ref 80.0–100.0)
MCV: 84.6 fL (ref 80.0–100.0)
Monocytes Absolute: 1.8 10*3/uL — ABNORMAL HIGH (ref 0.1–1.0)
Monocytes Absolute: 1.7 10*3/uL — ABNORMAL HIGH (ref 0.1–1.0)
Monocytes Absolute: 1 10*3/uL (ref 0.1–1.0)
Monocytes Relative: 9 %
Monocytes Relative: 9 %
Monocytes Relative: 7 %
Neutro Abs: 15.4 10*3/uL — ABNORMAL HIGH (ref 1.7–7.7)
Neutro Abs: 15.1 10*3/uL — ABNORMAL HIGH (ref 1.7–7.7)
Neutro Abs: 11.4 10*3/uL — ABNORMAL HIGH (ref 1.7–7.7)
Neutrophils Relative %: 79 %
Neutrophils Relative %: 77 %
Neutrophils Relative %: 78 %
Platelets: 251 10*3/uL (ref 150–400)
Platelets: 257 10*3/uL (ref 150–400)
Platelets: 189 10*3/uL (ref 150–400)
RBC: 4.22 MIL/uL (ref 4.22–5.81)
RBC: 4.2 MIL/uL — ABNORMAL LOW (ref 4.22–5.81)
RBC: 3.95 MIL/uL — ABNORMAL LOW (ref 4.22–5.81)
RDW: 11.9 % (ref 11.5–15.5)
RDW: 11.9 % (ref 11.5–15.5)
RDW: 12.9 % (ref 11.5–15.5)
WBC: 19.6 10*3/uL — ABNORMAL HIGH (ref 4.0–10.5)
WBC: 19.5 10*3/uL — ABNORMAL HIGH (ref 4.0–10.5)
WBC: 14.6 10*3/uL — ABNORMAL HIGH (ref 4.0–10.5)
nRBC: 0 % (ref 0.0–0.2)
nRBC: 0 % (ref 0.0–0.2)
nRBC: 0 % (ref 0.0–0.2)

## 2021-07-25 LAB — POCT I-STAT 7, (LYTES, BLD GAS, ICA,H+H)
Acid-base deficit: 2 mmol/L (ref 0.0–2.0)
Acid-base deficit: 3 mmol/L — ABNORMAL HIGH (ref 0.0–2.0)
Acid-base deficit: 4 mmol/L — ABNORMAL HIGH (ref 0.0–2.0)
Acid-base deficit: 4 mmol/L — ABNORMAL HIGH (ref 0.0–2.0)
Bicarbonate: 21.1 mmol/L (ref 20.0–28.0)
Bicarbonate: 20.4 mmol/L (ref 20.0–28.0)
Bicarbonate: 21 mmol/L (ref 20.0–28.0)
Bicarbonate: 20.1 mmol/L (ref 20.0–28.0)
Calcium, Ion: 1.27 mmol/L (ref 1.15–1.40)
Calcium, Ion: 1.27 mmol/L (ref 1.15–1.40)
Calcium, Ion: 1.29 mmol/L (ref 1.15–1.40)
Calcium, Ion: 1.26 mmol/L (ref 1.15–1.40)
HCT: 34 % — ABNORMAL LOW (ref 39.0–52.0)
HCT: 32 % — ABNORMAL LOW (ref 39.0–52.0)
HCT: 32 % — ABNORMAL LOW (ref 39.0–52.0)
HCT: 29 % — ABNORMAL LOW (ref 39.0–52.0)
Hemoglobin: 11.6 g/dL — ABNORMAL LOW (ref 13.0–17.0)
Hemoglobin: 10.9 g/dL — ABNORMAL LOW (ref 13.0–17.0)
Hemoglobin: 10.9 g/dL — ABNORMAL LOW (ref 13.0–17.0)
Hemoglobin: 9.9 g/dL — ABNORMAL LOW (ref 13.0–17.0)
O2 Saturation: 98 %
O2 Saturation: 98 %
O2 Saturation: 96 %
O2 Saturation: 97 %
Patient temperature: 36.7
Patient temperature: 37.1
Patient temperature: 37
Potassium: 4 mmol/L (ref 3.5–5.1)
Potassium: 4.4 mmol/L (ref 3.5–5.1)
Potassium: 4.4 mmol/L (ref 3.5–5.1)
Potassium: 4.3 mmol/L (ref 3.5–5.1)
Sodium: 137 mmol/L (ref 135–145)
Sodium: 136 mmol/L (ref 135–145)
Sodium: 137 mmol/L (ref 135–145)
Sodium: 136 mmol/L (ref 135–145)
TCO2: 22 mmol/L (ref 22–32)
TCO2: 21 mmol/L — ABNORMAL LOW (ref 22–32)
TCO2: 22 mmol/L (ref 22–32)
TCO2: 21 mmol/L — ABNORMAL LOW (ref 22–32)
pCO2 arterial: 29.9 mmHg — ABNORMAL LOW (ref 32–48)
pCO2 arterial: 31.2 mmHg — ABNORMAL LOW (ref 32–48)
pCO2 arterial: 36.5 mmHg (ref 32–48)
pCO2 arterial: 31.9 mmHg — ABNORMAL LOW (ref 32–48)
pH, Arterial: 7.456 — ABNORMAL HIGH (ref 7.35–7.45)
pH, Arterial: 7.423 (ref 7.35–7.45)
pH, Arterial: 7.368 (ref 7.35–7.45)
pH, Arterial: 7.406 (ref 7.35–7.45)
pO2, Arterial: 105 mmHg (ref 83–108)
pO2, Arterial: 104 mmHg (ref 83–108)
pO2, Arterial: 86 mmHg (ref 83–108)
pO2, Arterial: 85 mmHg (ref 83–108)

## 2021-07-25 LAB — CBC
HCT: 33.5 % — ABNORMAL LOW (ref 39.0–52.0)
Hemoglobin: 11.8 g/dL — ABNORMAL LOW (ref 13.0–17.0)
MCH: 29.6 pg (ref 26.0–34.0)
MCHC: 35.2 g/dL (ref 30.0–36.0)
MCV: 84 fL (ref 80.0–100.0)
Platelets: 208 10*3/uL (ref 150–400)
RBC: 3.99 MIL/uL — ABNORMAL LOW (ref 4.22–5.81)
RDW: 12 % (ref 11.5–15.5)
WBC: 18.1 10*3/uL — ABNORMAL HIGH (ref 4.0–10.5)
nRBC: 0 % (ref 0.0–0.2)

## 2021-07-25 LAB — POCT I-STAT, CHEM 8
BUN: 26 mg/dL — ABNORMAL HIGH (ref 6–20)
BUN: 30 mg/dL — ABNORMAL HIGH (ref 6–20)
BUN: 25 mg/dL — ABNORMAL HIGH (ref 6–20)
Calcium, Ion: 1.24 mmol/L (ref 1.15–1.40)
Calcium, Ion: 1.25 mmol/L (ref 1.15–1.40)
Calcium, Ion: 1.24 mmol/L (ref 1.15–1.40)
Chloride: 106 mmol/L (ref 98–111)
Chloride: 105 mmol/L (ref 98–111)
Chloride: 106 mmol/L (ref 98–111)
Creatinine, Ser: 1.6 mg/dL — ABNORMAL HIGH (ref 0.61–1.24)
Creatinine, Ser: 1.5 mg/dL — ABNORMAL HIGH (ref 0.61–1.24)
Creatinine, Ser: 1.4 mg/dL — ABNORMAL HIGH (ref 0.61–1.24)
Glucose, Bld: 175 mg/dL — ABNORMAL HIGH (ref 70–99)
Glucose, Bld: 166 mg/dL — ABNORMAL HIGH (ref 70–99)
Glucose, Bld: 172 mg/dL — ABNORMAL HIGH (ref 70–99)
HCT: 34 % — ABNORMAL LOW (ref 39.0–52.0)
HCT: 32 % — ABNORMAL LOW (ref 39.0–52.0)
HCT: 33 % — ABNORMAL LOW (ref 39.0–52.0)
Hemoglobin: 11.6 g/dL — ABNORMAL LOW (ref 13.0–17.0)
Hemoglobin: 10.9 g/dL — ABNORMAL LOW (ref 13.0–17.0)
Hemoglobin: 11.2 g/dL — ABNORMAL LOW (ref 13.0–17.0)
Potassium: 4.4 mmol/L (ref 3.5–5.1)
Potassium: 4.5 mmol/L (ref 3.5–5.1)
Potassium: 4.5 mmol/L (ref 3.5–5.1)
Sodium: 136 mmol/L (ref 135–145)
Sodium: 137 mmol/L (ref 135–145)
Sodium: 138 mmol/L (ref 135–145)
TCO2: 22 mmol/L (ref 22–32)
TCO2: 25 mmol/L (ref 22–32)
TCO2: 24 mmol/L (ref 22–32)

## 2021-07-25 LAB — COMPREHENSIVE METABOLIC PANEL
ALT: 105 U/L — ABNORMAL HIGH (ref 0–44)
ALT: 85 U/L — ABNORMAL HIGH (ref 0–44)
AST: 403 U/L — ABNORMAL HIGH (ref 15–41)
AST: 290 U/L — ABNORMAL HIGH (ref 15–41)
Albumin: 2.1 g/dL — ABNORMAL LOW (ref 3.5–5.0)
Albumin: 1.9 g/dL — ABNORMAL LOW (ref 3.5–5.0)
Alkaline Phosphatase: 71 U/L (ref 38–126)
Alkaline Phosphatase: 64 U/L (ref 38–126)
Anion gap: 8 (ref 5–15)
Anion gap: 8 (ref 5–15)
BUN: 26 mg/dL — ABNORMAL HIGH (ref 6–20)
BUN: 27 mg/dL — ABNORMAL HIGH (ref 6–20)
CO2: 20 mmol/L — ABNORMAL LOW (ref 22–32)
CO2: 20 mmol/L — ABNORMAL LOW (ref 22–32)
Calcium: 8.7 mg/dL — ABNORMAL LOW (ref 8.9–10.3)
Calcium: 8.4 mg/dL — ABNORMAL LOW (ref 8.9–10.3)
Chloride: 106 mmol/L (ref 98–111)
Chloride: 108 mmol/L (ref 98–111)
Creatinine, Ser: 1.65 mg/dL — ABNORMAL HIGH (ref 0.61–1.24)
Creatinine, Ser: 1.52 mg/dL — ABNORMAL HIGH (ref 0.61–1.24)
GFR, Estimated: 51 mL/min — ABNORMAL LOW (ref >60)
GFR, Estimated: 56 mL/min — ABNORMAL LOW (ref >60)
Glucose, Bld: 172 mg/dL — ABNORMAL HIGH (ref 70–99)
Glucose, Bld: 164 mg/dL — ABNORMAL HIGH (ref 70–99)
Potassium: 4.4 mmol/L (ref 3.5–5.1)
Potassium: 4.5 mmol/L (ref 3.5–5.1)
Sodium: 134 mmol/L — ABNORMAL LOW (ref 135–145)
Sodium: 136 mmol/L (ref 135–145)
Total Bilirubin: 0.9 mg/dL (ref 0.3–1.2)
Total Bilirubin: 0.9 mg/dL (ref 0.3–1.2)
Total Protein: 6.2 g/dL — ABNORMAL LOW (ref 6.5–8.1)
Total Protein: 5.5 g/dL — ABNORMAL LOW (ref 6.5–8.1)

## 2021-07-25 LAB — BASIC METABOLIC PANEL
Anion gap: 9 (ref 5–15)
Anion gap: 8 (ref 5–15)
Anion gap: 9 (ref 5–15)
BUN: 25 mg/dL — ABNORMAL HIGH (ref 6–20)
BUN: 27 mg/dL — ABNORMAL HIGH (ref 6–20)
BUN: 27 mg/dL — ABNORMAL HIGH (ref 6–20)
CO2: 21 mmol/L — ABNORMAL LOW (ref 22–32)
CO2: 20 mmol/L — ABNORMAL LOW (ref 22–32)
CO2: 19 mmol/L — ABNORMAL LOW (ref 22–32)
Calcium: 8.5 mg/dL — ABNORMAL LOW (ref 8.9–10.3)
Calcium: 8.9 mg/dL (ref 8.9–10.3)
Calcium: 8.4 mg/dL — ABNORMAL LOW (ref 8.9–10.3)
Chloride: 102 mmol/L (ref 98–111)
Chloride: 106 mmol/L (ref 98–111)
Chloride: 109 mmol/L (ref 98–111)
Creatinine, Ser: 1.62 mg/dL — ABNORMAL HIGH (ref 0.61–1.24)
Creatinine, Ser: 1.78 mg/dL — ABNORMAL HIGH (ref 0.61–1.24)
Creatinine, Ser: 1.47 mg/dL — ABNORMAL HIGH (ref 0.61–1.24)
GFR, Estimated: 52 mL/min — ABNORMAL LOW (ref >60)
GFR, Estimated: 46 mL/min — ABNORMAL LOW (ref >60)
GFR, Estimated: 58 mL/min — ABNORMAL LOW (ref >60)
Glucose, Bld: 186 mg/dL — ABNORMAL HIGH (ref 70–99)
Glucose, Bld: 156 mg/dL — ABNORMAL HIGH (ref 70–99)
Glucose, Bld: 161 mg/dL — ABNORMAL HIGH (ref 70–99)
Potassium: 3.2 mmol/L — ABNORMAL LOW (ref 3.5–5.1)
Potassium: 3.9 mmol/L (ref 3.5–5.1)
Potassium: 4.5 mmol/L (ref 3.5–5.1)
Sodium: 132 mmol/L — ABNORMAL LOW (ref 135–145)
Sodium: 134 mmol/L — ABNORMAL LOW (ref 135–145)
Sodium: 137 mmol/L (ref 135–145)

## 2021-07-25 LAB — HEPATIC FUNCTION PANEL
ALT: 116 U/L — ABNORMAL HIGH (ref 0–44)
AST: 661 U/L — ABNORMAL HIGH (ref 15–41)
Albumin: 2.2 g/dL — ABNORMAL LOW (ref 3.5–5.0)
Alkaline Phosphatase: 61 U/L (ref 38–126)
Bilirubin, Direct: 0.1 mg/dL (ref 0.0–0.2)
Indirect Bilirubin: 1.5 mg/dL — ABNORMAL HIGH (ref 0.3–0.9)
Total Bilirubin: 1.6 mg/dL — ABNORMAL HIGH (ref 0.3–1.2)
Total Protein: 6.1 g/dL — ABNORMAL LOW (ref 6.5–8.1)

## 2021-07-25 LAB — FIBRINOGEN: Fibrinogen: >800 mg/dL — ABNORMAL HIGH (ref 210–475)

## 2021-07-25 LAB — MAGNESIUM
Magnesium: 1.7 mg/dL (ref 1.7–2.4)
Magnesium: 2.2 mg/dL (ref 1.7–2.4)
Magnesium: 2.1 mg/dL (ref 1.7–2.4)

## 2021-07-25 LAB — PROTIME-INR
INR: 1.3 — ABNORMAL HIGH (ref 0.8–1.2)
Prothrombin Time: 16.4 seconds — ABNORMAL HIGH (ref 11.4–15.2)

## 2021-07-25 LAB — GLUCOSE, CAPILLARY
Glucose-Capillary: 148 mg/dL — ABNORMAL HIGH (ref 70–99)
Glucose-Capillary: 153 mg/dL — ABNORMAL HIGH (ref 70–99)
Glucose-Capillary: 157 mg/dL — ABNORMAL HIGH (ref 70–99)
Glucose-Capillary: 161 mg/dL — ABNORMAL HIGH (ref 70–99)
Glucose-Capillary: 164 mg/dL — ABNORMAL HIGH (ref 70–99)
Glucose-Capillary: 164 mg/dL — ABNORMAL HIGH (ref 70–99)
Glucose-Capillary: 172 mg/dL — ABNORMAL HIGH (ref 70–99)
Glucose-Capillary: 176 mg/dL — ABNORMAL HIGH (ref 70–99)
Glucose-Capillary: 177 mg/dL — ABNORMAL HIGH (ref 70–99)
Glucose-Capillary: 182 mg/dL — ABNORMAL HIGH (ref 70–99)
Glucose-Capillary: 195 mg/dL — ABNORMAL HIGH (ref 70–99)
Glucose-Capillary: 196 mg/dL — ABNORMAL HIGH (ref 70–99)
Glucose-Capillary: 201 mg/dL — ABNORMAL HIGH (ref 70–99)
Glucose-Capillary: 80 mg/dL (ref 70–99)

## 2021-07-25 LAB — COOXEMETRY PANEL
Carboxyhemoglobin: 1.3 % (ref 0.5–1.5)
Carboxyhemoglobin: 1.6 % — ABNORMAL HIGH (ref 0.5–1.5)
Methemoglobin: <0.7 % (ref 0.0–1.5)
Methemoglobin: <0.7 % (ref 0.0–1.5)
O2 Saturation: 65.3 %
O2 Saturation: 82.8 %
Total hemoglobin: 12.8 g/dL (ref 12.0–16.0)
Total hemoglobin: 7.2 g/dL — ABNORMAL LOW (ref 12.0–16.0)

## 2021-07-25 LAB — CULTURE, BLOOD (ROUTINE X 2)
Culture: NO GROWTH
Culture: NO GROWTH
Special Requests: ADEQUATE
Special Requests: ADEQUATE

## 2021-07-25 LAB — APTT: aPTT: 49 seconds — ABNORMAL HIGH (ref 24–36)

## 2021-07-25 LAB — PHOSPHORUS: Phosphorus: 5 mg/dL — ABNORMAL HIGH (ref 2.5–4.6)

## 2021-07-25 LAB — HEPARIN LEVEL (UNFRACTIONATED)
Heparin Unfractionated: <0.1 IU/mL — ABNORMAL LOW (ref 0.30–0.70)
Heparin Unfractionated: 0.12 IU/mL — ABNORMAL LOW (ref 0.30–0.70)

## 2021-07-25 LAB — LACTIC ACID, PLASMA: Lactic Acid, Venous: 1.6 mmol/L (ref 0.5–1.9)

## 2021-07-25 LAB — PREPARE RBC (CROSSMATCH)

## 2021-07-25 LAB — ABO/RH: ABO/RH(D): A POS

## 2021-07-25 LAB — LACTATE DEHYDROGENASE: LDH: 1510 U/L — ABNORMAL HIGH (ref 98–192)

## 2021-07-25 SURGERY — INSERTION, CARDIAC ASSIST DEVICE, IMPELLA
Anesthesia: General

## 2021-07-25 MED ORDER — SODIUM CHLORIDE 0.9 % IV SOLN
0.7500 ug/kg/min | INTRAVENOUS | Status: DC
  Administered 2021-07-25: 0.75 ug/kg/min via INTRAVENOUS
  Filled 2021-07-25 (×2): qty 50

## 2021-07-25 MED ORDER — MUPIROCIN 2 % EX OINT
1.0000 | TOPICAL_OINTMENT | Freq: Two times a day (BID) | CUTANEOUS | Status: AC
Start: 2021-07-25 — End: 2021-07-30
  Administered 2021-07-25 – 2021-07-30 (×10): 1 via NASAL
  Filled 2021-07-25 (×2): qty 22

## 2021-07-25 MED ORDER — SODIUM CHLORIDE 0.9% IV SOLUTION: INTRAVENOUS | Status: DC | PRN

## 2021-07-25 MED ORDER — MAGNESIUM SULFATE 2 GM/50ML IV SOLN
2.0000 g | Freq: Once | INTRAVENOUS | Status: AC
  Administered 2021-07-25: 2 g via INTRAVENOUS
  Filled 2021-07-25: qty 50

## 2021-07-25 MED ORDER — HEPARIN 6000 UNIT IRRIGATION SOLUTION
Status: AC
  Filled 2021-07-25: qty 500

## 2021-07-25 MED ORDER — MIDAZOLAM HCL 2 MG/2ML IJ SOLN
INTRAMUSCULAR | Status: DC | PRN
  Administered 2021-07-25: 2 mg via INTRAVENOUS

## 2021-07-25 MED ORDER — ROCURONIUM BROMIDE 10 MG/ML (PF) SYRINGE
PREFILLED_SYRINGE | INTRAVENOUS | Status: AC
  Filled 2021-07-25: qty 10

## 2021-07-25 MED ORDER — AMIODARONE HCL IN DEXTROSE 360-4.14 MG/200ML-% IV SOLN
30.0000 mg/h | INTRAVENOUS | Status: DC
  Administered 2021-07-26 – 2021-07-30 (×9): 30 mg/h via INTRAVENOUS
  Filled 2021-07-25 (×8): qty 200

## 2021-07-25 MED ORDER — VITAL 1.5 CAL PO LIQD
1000.0000 mL | ORAL | Status: DC
  Administered 2021-07-25 – 2021-07-29 (×5): 1000 mL

## 2021-07-25 MED ORDER — FENTANYL CITRATE (PF) 250 MCG/5ML IJ SOLN
INTRAMUSCULAR | Status: AC
  Filled 2021-07-25: qty 5

## 2021-07-25 MED ORDER — HEPARIN 6000 UNIT IRRIGATION SOLUTION
Status: DC | PRN
  Administered 2021-07-25: 1

## 2021-07-25 MED ORDER — LACTATED RINGERS IV SOLN: INTRAVENOUS | Status: DC | PRN

## 2021-07-25 MED ORDER — HEMOSTATIC AGENTS (NO CHARGE) OPTIME
TOPICAL | Status: DC | PRN
Start: 2021-07-25 — End: 2021-07-25
  Administered 2021-07-25 (×2): 1 via TOPICAL

## 2021-07-25 MED ORDER — VITAL HIGH PROTEIN PO LIQD: 1000.0000 mL | ORAL | Status: DC

## 2021-07-25 MED ORDER — POTASSIUM CHLORIDE 10 MEQ/50ML IV SOLN
10.0000 meq | INTRAVENOUS | Status: AC
  Administered 2021-07-25 (×4): 10 meq via INTRAVENOUS
  Filled 2021-07-25: qty 50

## 2021-07-25 MED ORDER — AMIODARONE HCL IN DEXTROSE 360-4.14 MG/200ML-% IV SOLN
60.0000 mg/h | INTRAVENOUS | Status: AC
Start: 2021-07-25 — End: 2021-07-26
  Administered 2021-07-25: 60 mg/h via INTRAVENOUS
  Filled 2021-07-25 (×2): qty 200

## 2021-07-25 MED ORDER — SODIUM CHLORIDE 0.9 % IV SOLN
0.7500 ug/kg/min | INTRAVENOUS | Status: DC
  Filled 2021-07-25: qty 50

## 2021-07-25 MED ORDER — FUROSEMIDE 10 MG/ML IJ SOLN
40.0000 mg | Freq: Two times a day (BID) | INTRAMUSCULAR | Status: DC
  Administered 2021-07-25 (×2): 40 mg via INTRAVENOUS
  Filled 2021-07-25 (×2): qty 4

## 2021-07-25 MED ORDER — PROSOURCE TF PO LIQD
45.0000 mL | Freq: Four times a day (QID) | ORAL | Status: DC
  Administered 2021-07-25 – 2021-07-30 (×18): 45 mL
  Filled 2021-07-25 (×17): qty 45

## 2021-07-25 MED ORDER — POTASSIUM CHLORIDE 10 MEQ/50ML IV SOLN: 10.0000 meq | INTRAVENOUS | Status: DC

## 2021-07-25 MED ORDER — ROCURONIUM BROMIDE 10 MG/ML (PF) SYRINGE
PREFILLED_SYRINGE | INTRAVENOUS | Status: DC | PRN
  Administered 2021-07-25: 20 mg via INTRAVENOUS
  Administered 2021-07-25: 50 mg via INTRAVENOUS
  Administered 2021-07-25: 40 mg via INTRAVENOUS
  Administered 2021-07-25: 50 mg via INTRAVENOUS

## 2021-07-25 MED ORDER — POTASSIUM CHLORIDE 20 MEQ PO PACK
40.0000 meq | PACK | Freq: Two times a day (BID) | ORAL | Status: DC
  Administered 2021-07-25: 40 meq
  Filled 2021-07-25: qty 2

## 2021-07-25 MED ORDER — POTASSIUM CHLORIDE 10 MEQ/100ML IV SOLN: 10.0000 meq | INTRAVENOUS | Status: DC

## 2021-07-25 MED ORDER — HEPARIN SODIUM (PORCINE) 5000 UNIT/ML IJ SOLN
INTRAVENOUS | Status: DC
  Filled 2021-07-25 (×3): qty 10

## 2021-07-25 MED ORDER — HEPARIN SODIUM (PORCINE) 1000 UNIT/ML IJ SOLN
INTRAMUSCULAR | Status: DC | PRN
  Administered 2021-07-25 (×2): 4000 [IU] via INTRAVENOUS

## 2021-07-25 MED ORDER — FENTANYL CITRATE (PF) 100 MCG/2ML IJ SOLN
INTRAMUSCULAR | Status: DC | PRN
  Administered 2021-07-25 (×2): 50 ug via INTRAVENOUS

## 2021-07-25 MED ORDER — 0.9 % SODIUM CHLORIDE (POUR BTL) OPTIME
TOPICAL | Status: DC | PRN
  Administered 2021-07-25: 3000 mL

## 2021-07-25 MED ORDER — SODIUM CHLORIDE 0.9 % IV SOLN: INTRAVENOUS | Status: DC | PRN

## 2021-07-25 MED ORDER — QUETIAPINE FUMARATE 25 MG PO TABS
25.0000 mg | ORAL_TABLET | Freq: Two times a day (BID) | ORAL | Status: DC
  Administered 2021-07-25 – 2021-07-30 (×10): 25 mg
  Filled 2021-07-25 (×10): qty 1

## 2021-07-25 MED ORDER — HEPARIN SODIUM (PORCINE) 5000 UNIT/ML IJ SOLN
INTRAVENOUS | Status: DC
  Filled 2021-07-25 (×4): qty 10

## 2021-07-25 MED ORDER — PROSOURCE TF PO LIQD: 45.0000 mL | Freq: Two times a day (BID) | ORAL | Status: DC

## 2021-07-25 MED ORDER — AMIODARONE LOAD VIA INFUSION
150.0000 mg | Freq: Once | INTRAVENOUS | Status: AC
  Administered 2021-07-25: 150 mg via INTRAVENOUS
  Filled 2021-07-25: qty 83.34

## 2021-07-25 MED ORDER — VANCOMYCIN HCL IN DEXTROSE 1-5 GM/200ML-% IV SOLN: 1000.0000 mg | INTRAVENOUS | Status: DC

## 2021-07-25 MED ORDER — POTASSIUM CHLORIDE 20 MEQ PO PACK
40.0000 meq | PACK | Freq: Two times a day (BID) | ORAL | Status: DC
Start: 2021-07-25 — End: 2021-07-25

## 2021-07-25 MED ORDER — MIDAZOLAM HCL 2 MG/2ML IJ SOLN
INTRAMUSCULAR | Status: AC
  Filled 2021-07-25: qty 2

## 2021-07-25 MED ORDER — HEPARIN SODIUM (PORCINE) 1000 UNIT/ML IJ SOLN
INTRAMUSCULAR | Status: AC
  Filled 2021-07-25: qty 10

## 2021-07-25 SURGICAL SUPPLY — 49 items
BLADE CLIPPER SURG (BLADE) ×2 IMPLANT
CATH ACCU-VU SIZ PIG 5F 100CM (CATHETERS) ×1 IMPLANT
CATH DIAG EXPO 6F AL1 (CATHETERS) ×1 IMPLANT
CATH DIAG EXPO 6F FR4 (CATHETERS) ×1 IMPLANT
CLIP TI MEDIUM 24 (CLIP) ×1 IMPLANT
CLIP TI WIDE RED SMALL 24 (CLIP) ×1 IMPLANT
CONTAINER PROTECT SURGISLUSH (MISCELLANEOUS) ×2 IMPLANT
DRAIN JP 15F RND RADIO PRF (DRAIN) ×1 IMPLANT
DRAPE C-ARM 42X72 X-RAY (DRAPES) ×3 IMPLANT
DRAPE CV SPLIT W-CLR ANES SCRN (DRAPES) ×1 IMPLANT
DRAPE PERI GROIN 82X75IN TIB (DRAPES) ×1 IMPLANT
DRAPE SLUSH/WARMER DISC (DRAPES) ×2 IMPLANT
DRSG TEGADERM 4X4.5 CHG (GAUZE/BANDAGES/DRESSINGS) ×1 IMPLANT
DRSG TEGADERM 4X4.75 (GAUZE/BANDAGES/DRESSINGS) ×3 IMPLANT
ELECT BLADE 4.0 EZ CLEAN MEGAD (MISCELLANEOUS) ×2
ELECTRODE BLDE 4.0 EZ CLN MEGD (MISCELLANEOUS) IMPLANT
EVACUATOR SILICONE 100CC (DRAIN) ×1 IMPLANT
GAUZE 4X4 16PLY ~~LOC~~+RFID DBL (SPONGE) ×2 IMPLANT
GAUZE SPONGE 4X4 12PLY STRL (GAUZE/BANDAGES/DRESSINGS) ×2 IMPLANT
GAUZE XEROFORM 1X8 LF (GAUZE/BANDAGES/DRESSINGS) ×2 IMPLANT
GLOVE SURG ENC TEXT LTX SZ7.5 (GLOVE) ×6 IMPLANT
GOWN STRL REUS W/ TWL LRG LVL3 (GOWN DISPOSABLE) ×4 IMPLANT
GOWN STRL REUS W/TWL LRG LVL3 (GOWN DISPOSABLE) ×8
GRAFT GELWEAVE IMPREG 10X30CM (Prosthesis & Implant Heart) ×1 IMPLANT
INSERT FOGARTY SM (MISCELLANEOUS) ×3 IMPLANT
KIT BASIN OR (CUSTOM PROCEDURE TRAY) ×2 IMPLANT
LOOP VESSEL MINI RED (MISCELLANEOUS) ×1 IMPLANT
NS IRRIG 1000ML POUR BTL (IV SOLUTION) ×8 IMPLANT
PACK CHEST (CUSTOM PROCEDURE TRAY) ×2 IMPLANT
PAD ARMBOARD 7.5X6 YLW CONV (MISCELLANEOUS) ×4 IMPLANT
PAD ELECT DEFIB RADIOL ZOLL (MISCELLANEOUS) ×2 IMPLANT
POWDER SURGICEL 3.0 GRAM (HEMOSTASIS) ×1 IMPLANT
PUMP SET IMPELLA 5.5 US (CATHETERS) ×2 IMPLANT
SEALANT SURG COSEAL 8ML (VASCULAR PRODUCTS) ×2 IMPLANT
SPONGE T-LAP 18X18 ~~LOC~~+RFID (SPONGE) ×6 IMPLANT
SPONGE T-LAP 4X18 ~~LOC~~+RFID (SPONGE) ×1 IMPLANT
STAPLER VISISTAT 35W (STAPLE) ×1 IMPLANT
SUT PROLENE 5 0 C 1 36 (SUTURE) ×1 IMPLANT
SUT PROLENE 6 0 CC (SUTURE) ×2 IMPLANT
SUT SILK  1 MH (SUTURE) ×3
SUT SILK 1 MH (SUTURE) ×4 IMPLANT
SUT SILK 2 0 SH CR/8 (SUTURE) ×1 IMPLANT
SUT VIC AB 1 CTX 18 (SUTURE) ×1 IMPLANT
SUT VIC AB 3-0 SH 8-18 (SUTURE) ×1 IMPLANT
TAPE CLOTH SURG 4X10 WHT LF (GAUZE/BANDAGES/DRESSINGS) ×1 IMPLANT
TOWEL GREEN STERILE (TOWEL DISPOSABLE) ×2 IMPLANT
TOWEL GREEN STERILE FF (TOWEL DISPOSABLE) ×2 IMPLANT
WATER STERILE IRR 1000ML POUR (IV SOLUTION) ×4 IMPLANT
WIRE EMERALD 3MM-J .035X260CM (WIRE) ×1 IMPLANT

## 2021-07-25 NOTE — Progress Notes (Addendum)
ANTICOAGULATION CONSULT NOTE  ? ?Pharmacy Consult for heparin ?Indication: chest pain/ACS, IABP in place ? ?Allergies  ?Allergen Reactions  ? Penicillins Other (See Comments) and Anaphylaxis  ?  Has patient had a PCN reaction causing immediate rash, facial/tongue/throat swelling, SOB or lightheadedness with hypotension: Yes ?Has patient had a PCN reaction causing severe rash involving mucus membranes or skin necrosis: No ?Has patient had a PCN reaction that required hospitalization: No ?Has patient had a PCN reaction occurring within the last 10 years: No ?If all of the above answers are "NO", then may proceed with Cephalosporin use. ?  ? Empagliflozin   ?  Other reaction(s): Urinary tract infectious disease  ? Liraglutide Nausea And Vomiting  ? Penicillin G Rash and Hives  ? ? ?Patient Measurements: ?Height: 5\' 9"  (175.3 cm) ?Weight: 81.6 kg (180 lb) ?IBW/kg (Calculated) : 70.7 ?Heparin Dosing Weight: TBW ? ?Vital Signs: ?Temp: 98.8 ?F (37.1 ?C) (04/03 0200) ?BP: 95/57 (04/03 0200) ?Pulse Rate: 129 (04/03 0200) ? ?Labs: ?Recent Labs  ?  07/28/2021 ?2030 08/20/2021 ?0017 08/21/2021 ?0335 08/19/2021 ?0350 07/23/2021 ?1234 07/28/2021 ?1242 08/20/2021 ?[id number] 07/24/21 ?2127 07/31/2021 ?0021 07/29/2021 ?[id number] 08/07/2021 ?0231  ?HGB 15.3 15.3  --  14.4   < > 13.3  --  12.9*  --  12.4*  --   ?HCT 44.7 45.3  --  41.5   < > 39.0  --  38.0*  --  35.5*  --   ?PLT 306 358  --  276  --   --   --   --   --  251  --   ?HEPARINUNFRC  --  0.43  --  <0.10*  --   --   --   --   --   --  <0.10*  ?CREATININE 1.39* 1.54*  --   --   --   --  1.76*  --  1.62*  --   --   ?TROPONINIHS 2,234* 3,[id number]*  --   --   --   --   --   --   --   --   ? < > = values in this interval not displayed.  ? ? ? ?Estimated Creatinine Clearance: 55.2 mL/min (A) (by C-G formula based on SCr of 1.62 mg/dL (H)). ? ? ?Assessment: ?56 YOM presenting with CP, SOB over last few days, elevated troponin.  He is not on anticoagulation PTA, CBC wnl. He is noted with HF (EF < 20%) and  cardiogenic shock s/p cath with stent placed and IABP in place.  Heparin restarted post cath. ? ?Heparin level undetectable on infusion at 800 units/hr. No issues with line or bleeding reported per RN. ? ?Goal of Therapy:  ?Heparin level 0.2-0.5 units/ml ?Monitor platelets by anticoagulation protocol: Yes ?  ?Plan:  ?Increase heparin to 1000 units/hr ?F/u 6 hr heparin level ? ?Sherlon Handing, PharmD, BCPS ?Please see amion for complete clinical pharmacist phone list ?08/05/2021 3:12 AM ? ? ? ? ? ? ?

## 2021-07-25 NOTE — Progress Notes (Signed)
OR at bedside to transport patient for Impella placement.  Consents signed and on patient chart. ?

## 2021-07-25 NOTE — Brief Op Note (Signed)
08/07/2021 ? ?4:30 PM ? ?PATIENT:  [name] [name]  51 y.o. male ? ?PRE-OPERATIVE DIAGNOSIS:  SHOCK, acute MI ? ?POST-OPERATIVE DIAGNOSIS:  SHOCK, Acute MI ? ?PROCEDURE:  Procedure(s) with comments: ?PLACEMENT OF IMPELLA 5.5 LEFT VENTRICULAR ASSIST DEVICE (N/A) - WITH FLURO ?TRANSESOPHAGEAL ECHOCARDIOGRAM (TEE) (N/A) ?REMOVAL OF AORTIC BALLOON PUMP ?PLACEMENT OF PA SWAN CATHETER ? ?SURGEON:  Surgeon(s) and Role: ?   Dahlia Byes, MD - Primary ?   [name], Elby Showers, MD - Assisting ? ?PHYSICIAN ASSISTANT: rnfa Acuna ? ?ASSISTANTS: Dr [name]  ? ?ANESTHESIA:   general ? ?EBL:  350cc  ? ?BLOOD ADMINISTERED: 1 unit  CC PRBC ? ?DRAINS: (33F) Jackson-Pratt drain(s) with closed bulb suction in the Impella incision   ? ?LOCAL MEDICATIONS USED:  NONE ? ?SPECIMEN:  No Specimen ? ?DISPOSITION OF SPECIMEN:  N/A ? ?COUNTS:  YES ? ?TOURNIQUET:  * No tourniquets in log * ? ?DICTATION: .Dragon Dictation ? ?PLAN OF CARE:  return to [place] [facility] ? ?PATIENT DISPOSITION:  ICU - intubated and hemodynamically stable. ?  ?Delay start of Pharmacological VTE agent (>24hrs) due to surgical blood loss or risk of bleeding: yes- hold peripheral iv heparin until am 4-4 ? ?

## 2021-07-25 NOTE — Consult Note (Signed)
? ?NAME:  SERAFIN DECATUR, MRN:  [id number], DOB:  04/02/1971, LOS: 2 ?ADMISSION DATE:  2021-08-19, CONSULTATION DATE: 08/21/2021 ?REFERRING MD: Dr. Laren Everts, CHIEF COMPLAINT: Chest pain ? ?History of Present Illness:  ?This is a 51 year old white male who presented to the emergency room with new onset chest pain.  Patient states that he had substernal chest pain/pressure radiating to the left side of his chest.  He denies radiculopathy into the arm or into the jaw.  Prior to coming to the hospital he was short of breath and diaphoretic with chest pain.  He denies any nausea or vomiting.  Patient being seen at [facility] emergency room 2 days ago for a diabetic foot ulcer on the right foot.  He was sent home at that time on doxycycline.  His blood sugars generally out-of-control with a last hemoglobin A1c of 11.  Currently the patient states that he is a 1 out of 10 chest pressure.  Patient has a history of myocardial infarction with angioplasty in 2021.  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 ? ?Pertinent  Medical History  ?Uncontrolled diabetes mellitus type 2 ?Right great toe foot ulcer ?Myocardial infarction ?CVA ?Hypertension ? ?Significant Hospital Events: ?Including procedures, antibiotic start and stop dates in addition to other pertinent events   ?4/2 admitted with chest pain.  Suspect missed STEMI ?4/2 cardiac catheterization shows triple-vessel disease with total occlusion of an ostial LAD lesion with successful stent placement.  Continued occlusion of the distal LAD.  Elevated LVEDP at 24 mmHg ?4/2 insertion of intra-aortic balloon pump ?4/2 Foley catheter placed by urology.  Patient has prior stricture ?4/2 echocardiogram shows EF less than 20% with severely decreased LV function with global hypokinesis.  Inferolateral segment moves best. ?4/2 intubated for respiratory distress. ? ? ?Subjective/overnight events:  ?Remains intubated.  Drops  pressure due to vagal maneuvers when sedation is weaned ? ?Objective   ?Blood pressure (!) 81/52, pulse (!) 118, temperature 98.8 ?F (37.1 ?C), resp. rate 14, height 5\' 9"  (1.753 m), weight 81.6 kg, SpO2 97 %. ?PAP: (26-46)/(12-28) 29/18 ?CVP:  [6 mmHg-15 mmHg] 7 mmHg ?PCWP:  [12 mmHg] 12 mmHg ?CO:  [3.8 L/min-4.1 L/min] 4.1 L/min ?CI:  [1.9 L/min/m2-2.1 L/min/m2] 2.1 L/min/m2  ?Vent Mode: PRVC ?FiO2 (%):  [40 %-100 %] 40 % ?Set Rate:  [14 bmp-18 bmp] 14 bmp ?Vt Set:  [560 mL] 560 mL ?PEEP:  [5 cmH20] 5 cmH20 ?Plateau Pressure:  [18 cmH20-20 cmH20] 18 cmH20  ? ?Intake/Output Summary (Last 24 hours) at 07/29/2021 1210 ?Last data filed at 07/28/2021 0815 ?Gross per 24 hour  ?Intake 3174.69 ml  ?Output 1640 ml  ?Net 1534.69 ml  ? ?Filed Weights  ? August 19, 2021 2021  ?Weight: 81.6 kg  ? ? ?Examination: ?General: Appears older than stated age.  Intubated on fentanyl propofol for sedation. ?HENT: ET tube OG tube in place. ?Lungs: Clear to auscultation bilaterally.  No wheezing rales or rhonchi noted. ?Cardiovascular: Tachycardic, no rub murmur gallop appreciated.  Intra-aortic balloon pump in left groin at one-to-one.  Left lower extremity warm.  Right cooler to touch. ?Abdomen: Soft, nontender, nondistended, no rebound/rigidity/guarding.  Positive bowel sounds. ?Extremities: No edema of the lower extremities.  Distal pulses are weakly present x4 extremities.  Temperature is uniform across all extremities proximal and distal.  The right great toe on the dorsal surface has a 1.5 x 1.5 area of eschar.  No significant erythema.  There is no  change in pallor. ?Neuro: Currently sedated, not following commands ?Skin: No rash ? ? ?Assessment & Plan:  ?Critically ill due to acute cardiogenic shock requiring titration of inotropes and vasopressors and management of mechanical cardiac support due to acute coronary syndrome with STEMI ?HFrEF EF estimated 10 to 15% ?Critically ill due to acute hypoxic respiratory failure requiring mechanical  ventilation ?Peripheral vascular disease ?Right great toe ulceration with eschar in place ?Uncontrolled diabetes mellitus ?Hypertension ?Wide-complex tachycardia ? ?Plan: ?-Patient to go for 5.5 Impella this afternoon as cardiac index remains marginal and left-sided filling pressures remain elevated despite inotropic and intra-aortic balloon pump support ?-Once hemodynamics are optimized and filling pressures improve with diuresis, can wean sedation and work towards extubation. ?-Goal would eventually be to transition patient to guideline directed heart failure therapy as we await family to arrive from [place].  Patient does not have any good long-term salvage options apart from optimize medical therapy. ? ?Best Practice (right click and "Reselect all SmartList Selections" daily)  ? ?Diet/type: tubefeeds and NPO w/ oral meds -Will initiate tube feeds after surgery. ?DVT prophylaxis: systemic heparin ?GI prophylaxis: PPI ?Lines: N/A ?Foley:  N/A ?Code Status:  full code ?Last date of multidisciplinary goals of care discussion [family updated at bedside.  They are aware of poor prognosis and patient's prior health difficulties.] ? ?CRITICAL CARE ?Performed by: Lynnell Catalan ? ? ?Total critical care time: 40 minutes ? ?Critical care time was exclusive of separately billable procedures and treating other patients. ? ?Critical care was necessary to treat or prevent imminent or life-threatening deterioration. ? ?Critical care was time spent personally by me on the following activities: development of treatment plan with patient and/or surrogate as well as nursing, discussions with consultants, evaluation of patient's response to treatment, examination of patient, obtaining history from patient or surrogate, ordering and performing treatments and interventions, ordering and review of laboratory studies, ordering and review of radiographic studies, pulse oximetry, re-evaluation of patient's condition and participation in  multidisciplinary rounds. ? ?Lynnell Catalan, MD FRCPC ?ICU Physician ?[facility]  ?Pager: [phone] ?Mobile: [phone] ?After hours: [phone]. ? ? ? ? ?  ?

## 2021-07-25 NOTE — Progress Notes (Signed)
Initial Nutrition Assessment ? ?DOCUMENTATION CODES:  ? ?Not applicable ? ?INTERVENTION:  ? ?Tube Feeding via OG:  ?Vital 1.5 at 60 ml/hr ?Start TF at rate of 20 ml/hr; titrate by 10 mL q 8 hours until goal rate of 60 ml/hr ?Pro-Source TF 45 mL QID ?This provides 141 g of protein, 2320 kcals and 1094 mL of free water ? ? ?NUTRITION DIAGNOSIS:  ? ?Inadequate oral intake related to acute illness as evidenced by NPO status. ? ? ?GOAL:  ? ?Patient will meet greater than or equal to 90% of their needs ? ? ?MONITOR:  ? ?Vent status, Labs, Weight trends, TF tolerance ? ?REASON FOR ASSESSMENT:  ? ?Ventilator ?  ? ?ASSESSMENT:  ? ?51 yo male admitted with acute coronary syndrome with NSTEMI, CHF with EF 10-15%, shock-cardiogenic with possible sepsis as well. Pt has diabetic foot ulcer. PMH includes DM-poorly controlled (HgbA1c 11.8), HTN, stroke. Pt is wheelchair bound from residual right-sided weakness/deficits from previous stroke ? ?4/02 Admitted to ICU, Intubated for Heart Cath, PCI to LAD, IABP ? ?OR today for Impella ?Pt remains sedated on vent. Pt's Son and Pt's Sister at bedside.  ?Pt currently requiring levophed (down to 5 mcg/min), dobutamine (2.5 mcg/kg/min), milrinone. Sedated on precedex and fentanyl gtt. Pt also on insulin gtt.  ? ?Pt has been wheelchair bound since his strokes several years ago.  ?Pt lives with a roommate and is mostly homebound; does occasionally go out to [place] with his Son.  ? ?Pt mostly eats microwave meals or foods that are easy to prepare. Per Son, pt appetite has not been very good recently. Based on observation, family does not believe that pt has lost any weight recently. Son reports UBW around 170 pounds; current wt 81.6 kg (180 pounds-appears it may be stated/estimated weight). Need to obtain new weight ? ?Pt's son also indicates that pt reports he has been experiencing N/V regularly since having stroke several years ago.  ? ?Diabetic foot ulcer to Right Great Toe and top of  Right foot; evaluated by WOC RN ? ?OG tube in place-tip in distal stomach or proximal duodenum per abd xray; plan to initiate TF today after OR.   ? ?Abd imaging also indicating moderate to large stool burden. Pt has scheduled bowel regimen but may need to be more aggressive if pt does not have BM in next few days ? ?Labs: CBGs 80-177 (insulin drip), sodium 136 (wdl), potassium 4.4 (wdl), Creatinine 1.65, BUN 26 ?Meds: insulin gtt, lasix, folic acid, colace BID, mag sulfate, MVI with minerals daily, KCl, miralax daily ? ?NUTRITION - FOCUSED PHYSICAL EXAM: ? ?Flowsheet Row Most Recent Value  ?Orbital Region No depletion  ?Upper Arm Region No depletion  ?Thoracic and Lumbar Region No depletion  ?Buccal Region No depletion  ?Temple Region No depletion  ?Clavicle Bone Region No depletion  ?Clavicle and Acromion Bone Region No depletion  ?Scapular Bone Region No depletion  ?Dorsal Hand No depletion  ?Patellar Region Unable to assess  ?Anterior Thigh Region Unable to assess  ?Posterior Calf Region Unable to assess  ?Edema (RD Assessment) Mild  ?Hair Reviewed  ?Eyes Unable to assess  ?Mouth Unable to assess  ?Skin Reviewed  ?Nails Reviewed  ? ?  ? ? ? ?Diet Order:   ?Diet Order   ? ?       ?  Diet NPO time specified  Diet effective midnight       ?  ?  Diet NPO time specified  Diet effective now       ?  ? ?  ?  ? ?  ? ? ?  EDUCATION NEEDS:  ? ?Not appropriate for education at this time ? ?Skin:  Skin Assessment: Skin Integrity Issues: ?Skin Integrity Issues:: Diabetic Ulcer ?Diabetic Ulcer: R. Great Toe, R. Foot (on top) ? ?Last BM:  4/1 ? ?Height:  ? ?Ht Readings from Last 1 Encounters:  ?08/05/2021 5\' 9"  (1.753 m)  ? ? ?Weight:  ? ?Wt Readings from Last 1 Encounters:  ?08/14/2021 81.6 kg  ? ? ?BMI:  Body mass index is 26.58 kg/m?. ? ?Estimated Nutritional Needs:  ? ?Kcal:  09/22/21 kcals ? ?Protein:  125-150 g ? ?Fluid:  2L ? ? ? ?[id number] MS, RDN, LDN, CNSC ?Registered Dietitian III ?Clinical Nutrition ?RD Pager and  On-Call Pager Number Located in [employer]  ? ?

## 2021-07-25 NOTE — Transfer of Care (Signed)
Immediate Anesthesia Transfer of Care Note ? ?Patient: [name] [name] ? ?Procedure(s) Performed: PLACEMENT OF IMPELLA 5.5 LEFT VENTRICULAR ASSIST DEVICE ?TRANSESOPHAGEAL ECHOCARDIOGRAM (TEE) ? ?Patient Location: ICU ? ?Anesthesia Type:General ? ?Level of Consciousness: Patient remains intubated per anesthesia plan ? ?Airway & Oxygen Therapy: Patient remains intubated per anesthesia plan and Patient placed on Ventilator (see vital sign flow sheet for setting) ? ?Post-op Assessment: Report given to RN and Post -op Vital signs reviewed and stable ? ?Post vital signs: Reviewed and stable ? ?Last Vitals:  ?Vitals Value Taken Time  ?BP 92/57 08/05/2021 1648  ?Temp    ?Pulse 132 08/14/2021 1651  ?Resp 9 08/03/2021 1652  ?SpO2 92 % 07/23/2021 1651  ?Vitals shown include unvalidated device data. ? ?Last Pain:  ?Vitals:  ? 08/21/2021 1500  ?TempSrc: Core  ?PainSc:   ?   ? ?Patients Stated Pain Goal: 1 (08/05/2021 0000) ? ?Complications: No notable events documented. ?

## 2021-07-25 NOTE — Consult Note (Signed)
WOC Nurse Consult Note: ?Patient receiving care in [facility] 2H21. Primary RN in room at time of my assessment. ?Reason for Consult: right great toe wound ?Wound type: The patient is diabetic, so this could have started as a neuropathic/diabetic foot wound. Today, however, the entire plantar surface of the right great toe is purple/black and visually consistent with impaired arterial flow to the area.  There is also a small, round, non-deep to probing, darkened spot on the dorsum of the right foot. No drainage from these wounds. ?Pressure Injury POA: Yes/No/NA ?Measurement: ?Wound bed: ?Drainage (amount, consistency, odor)  ?Periwound: intact, pale ?Dressing procedure/placement/frequency: ?Apply iodine from the swabsticks or swab pads from clean utility to the darkened right great toe and the dark spot on the dorsum of the right foot.  Allow to air dry. Prevent pressure to the areas. ? ?Monitor the wound area(s) for worsening of condition such as: ?Signs/symptoms of infection,  ?Increase in size,  ?Development of or worsening of odor, ?Development of pain, or increased pain at the affected locations.  Notify the medical team if any of these develop. ? ?Thank you for the consult.  Discussed plan of care with the bedside nurse.  WOC nurse will not follow at this time.  Please re-consult the WOC team if needed. ? ?Helmut Muster, RN, MSN, CWOCN, CNS-BC, pager [phone]  ?  ?

## 2021-07-25 NOTE — Anesthesia Postprocedure Evaluation (Signed)
Anesthesia Post Note ? ?Patient: [name] [name] ? ?Procedure(s) Performed: PLACEMENT OF IMPELLA 5.5 LEFT VENTRICULAR ASSIST DEVICE ?TRANSESOPHAGEAL ECHOCARDIOGRAM (TEE) ? ?  ? ?Patient location during evaluation: SICU ?Anesthesia Type: General ?Level of consciousness: sedated ?Pain management: pain level controlled ?Vital Signs Assessment: post-procedure vital signs reviewed and stable ?Respiratory status: patient remains intubated per anesthesia plan ?Cardiovascular status: stable ?Postop Assessment: no apparent nausea or vomiting ?Anesthetic complications: no ? ? ?No notable events documented. ? ?Last Vitals:  ?Vitals:  ? 2021/08/12 1158 08-12-2021 1635  ?BP:    ?Pulse: (!) 118   ?Resp: 14   ?Temp:    ?SpO2: 97% 90%  ?  ?Last Pain:  ?Vitals:  ? 08/12/2021 1500  ?TempSrc: Core  ?PainSc:   ? ? ?  ?  ?  ?  ?  ?  ? ?Izabell Schalk P Manha Amato ? ? ? ? ?

## 2021-07-25 NOTE — Procedures (Signed)
Arterial Catheter Insertion Procedure Note ? ?[name] [name]  ?[id number]  ?1971-01-18 ? ?Date:07/28/2021  ?Time:6:51 PM  ? ? ?Provider Performing: Duayne Cal  ? ? ?Procedure: Insertion of Arterial Line (38882) with US guidance (80034)  ? ?Indication(s) ?Blood pressure monitoring and/or need for frequent ABGs ? ?Consent ?Risks of the procedure as well as the alternatives and risks of each were explained to the patient and/or caregiver.  Consent for the procedure was obtained and is signed in the bedside chart ? ?Anesthesia ?None ? ? ?Time Out ?Verified patient identification, verified procedure, site/side was marked, verified correct patient position, special equipment/implants available, medications/allergies/relevant history reviewed, required imaging and test results available. ? ? ?Sterile Technique ?Maximal sterile technique including full sterile barrier drape, hand hygiene, sterile gown, sterile gloves, mask, hair covering, sterile ultrasound probe cover (if used). ? ? ?Procedure Description ?Area of catheter insertion was cleaned with chlorhexidine and draped in sterile fashion. With real-time ultrasound guidance an arterial catheter was placed into the right radial artery.  Appropriate arterial tracings confirmed on monitor.   ? ? ?Complications/Tolerance ?None; patient tolerated the procedure well. ? ? ?EBL ?Minimal ? ? ?Specimen(s) ?None ? ? ?[name] [name], AGACNP-BC ?[facility] ? ?See Amion for personal pager ?PCCM on call pager [phone] until 7pm. ?Please call Elink 7p-7a. [phone] ? ?08/09/2021 6:51 PM ? ? ? ? ? ?

## 2021-07-25 NOTE — Progress Notes (Addendum)
Patient ID: [name] [name], male   DOB: [birth year], 51 y.o.   MRN: [id number] ? ?I was called to the OR to assist Dr. Donata Clay with Impella 5.5 placement.  I maneuvered the wire into the LV with difficulty given the anatomy of the right innominate artery.  We then positioned the Impella 5.5 in the LV, using TEE to adjust position.  The Impella was left short due to the angle of entry and difficulty with torquing (3.8 cm).  We increased him to P8 with flow 4.9 and no VT.  MAP improved significantly.  I decreased IABP to 1:3, should be able to stop IABP and discontinue.   ? ?Patient will need to resume heparin gtt eventually with Impella.  ? ?Patient currently stable in OR, sinus tachy 110s, MAP 90s.   ? ?CRITICAL CARE ?Performed by: Marca Ancona ? ?Total critical care time: 60 minutes ? ?Critical care time was exclusive of separately billable procedures and treating other patients. ? ?Critical care was necessary to treat or prevent imminent or life-threatening deterioration. ? ?Critical care was time spent personally by me on the following activities: development of treatment plan with patient and/or surrogate as well as nursing, discussions with consultants, evaluation of patient's response to treatment, examination of patient, obtaining history from patient or surrogate, ordering and performing treatments and interventions, ordering and review of laboratory studies, ordering and review of radiographic studies, pulse oximetry and re-evaluation of patient's condition. ? ?Marca Ancona ?Jul 29, 2021 ?3:29 PM ? ?

## 2021-07-25 NOTE — Progress Notes (Signed)
Patient ID: [name] [name], male   DOB: [birth year], 51 y.o.   MRN: [id number] ?  ? ? Advanced Heart Failure Rounding Note ? ?PCP-Cardiologist: None  ? ?Subjective:   ? ?Patient remains intubated.  Febrile to 101 last evening, now afebrile.  WBCs 19.5.  He is on vancomycin/aztreonam.  ? ?Luiz Blare numbers: ?CVP 10-11 ?PA 30/21 ?CI 2.02 ?Co-ox 65% ? ?Patient is on dobutamine 2.5, milrinone 0.25, NE 8.  MAP 80s, NE dose titrating down.  IABP 1:1, pedal pulses can be dopplered. Lactate now normal at 1.6.  UOP 1600 cc.  Creatinine 1.62 => 1.78 ? ?Patient on heparin gtt, ticagrelor, ASA ? ?Coronary angiography 4/2: Occluded ostial LAD (culprit), occluded ramus with collaterals, 90% distal LCx prior to left PDA.  PCI to ostial LAD with DES.  ? ? ?Objective:   ?Weight Range: ?81.6 kg ?Body mass index is 26.58 kg/m?.  ? ?Vital Signs:   ?Temp:  [97.9 ?F (36.6 ?C)-101.8 ?F (38.8 ?C)] 97.9 ?F (36.6 ?C) (04/03 0600) ?Pulse Rate:  [104-221] 114 (04/03 0600) ?Resp:  [0-28] 14 (04/03 0600) ?BP: (86-134)/(50-108) 115/57 (04/03 0700) ?SpO2:  [92 %-100 %] 100 % (04/03 0600) ?Arterial Line BP: (96-122)/(56-62) 122/59 (04/03 0600) ?FiO2 (%):  [40 %-100 %] 40 % (04/03 0411) ?Last BM Date : 08/12/2021 ? ?Weight change: ?Filed Weights  ? 08/09/2021 2021  ?Weight: 81.6 kg  ? ? ?Intake/Output:  ? ?Intake/Output Summary (Last 24 hours) at 08/04/2021 0755 ?Last data filed at 08/10/2021 0700 ?Gross per 24 hour  ?Intake 3619.32 ml  ?Output 1600 ml  ?Net 2019.32 ml  ?  ? ? ?Physical Exam  ?  ?General:  Intubated ?HEENT: Normal ?Neck: Supple. JVP 10. Carotids 2+ bilat; no bruits. No lymphadenopathy or thyromegaly appreciated. ?Cor: PMI nondisplaced. Regular rate & rhythm. IABP sounds ?Lungs: Clear ?Abdomen: Soft, nontender, nondistended. No hepatosplenomegaly. No bruits or masses. Good bowel sounds. ?Extremities: No cyanosis, clubbing, rash, edema. Ulcer right great toe.  ?Neuro: Will awaken and follow commands.  ? ? ?Telemetry  ? ?Sinus tachy 110 (personally  reviewed) ? ?ECG: Sinus tachy with residual anterior STE (personally reviewed) ? ?Labs  ?  ?CBC ?Recent Labs  ?  08/19/2021 ?[id number] 07/31/2021 ?[id number] 08/10/2021 ?[id number]  ?WBC 19.6*  --  19.5*  ?NEUTROABS 15.4*  --  15.1*  ?HGB 12.4* 11.6* 12.1*  ?HCT 35.5* 34.0* 35.5*  ?MCV 84.1  --  84.5  ?PLT 251  --  257  ? ?Basic Metabolic Panel ?Recent Labs  ?  08/02/2021 ?0021 08/03/2021 ?[id number] 08/06/2021 ?[id number]  ?NA 132* 137 134*  ?K 3.2* 4.0 3.9  ?CL 102  --  106  ?CO2 21*  --  20*  ?GLUCOSE 186*  --  156*  ?BUN 25*  --  27*  ?CREATININE 1.62*  --  1.78*  ?CALCIUM 8.5*  --  8.9  ?MG 1.7  --  2.2  ? ?Liver Function Tests ?Recent Labs  ?  08/08/2021 ?2030 07/25/21 ?0021  ?AST 54* 661*  ?ALT 35 116*  ?ALKPHOS 81 61  ?BILITOT 1.5* 1.6*  ?PROT 7.3 6.1*  ?ALBUMIN 2.8* 2.2*  ? ?No results for input(s): LIPASE, AMYLASE in the last 72 hours. ?Cardiac Enzymes ?No results for input(s): CKTOTAL, CKMB, CKMBINDEX, TROPONINI in the last 72 hours. ? ?BNP: ?BNP (last 3 results) ?Recent Labs  ?  07/24/21 ?0017  ?BNP 431.0*  ? ? ?ProBNP (last 3 results) ?No results for input(s): PROBNP in the last 8760 hours. ? ? ?D-Dimer ?Recent Labs  ?  08/15/2021 ?2030  ?DDIMER 2.08*  ? ?Hemoglobin A1C ?Recent Labs  ?  08/08/2021 ?0017  ?HGBA1C 11.8*  ? ?Fasting Lipid Panel ?Recent Labs  ?  07/29/2021 ?0017  ?CHOL 198  ?HDL 42  ?LDLCALC 131*  ?TRIG 124  ?CHOLHDL 4.7  ? ?Thyroid Function Tests ?No results for input(s): TSH, T4TOTAL, T3FREE, THYROIDAB in the last 72 hours. ? ?Invalid input(s): FREET3 ? ?Other results: ? ? ?Imaging  ? ? ?CARDIAC CATHETERIZATION ? ?Result Date: 07/23/2021 ?CONCLUSIONS: Total occlusion ostial LAD treated with PCI and stent implantation with 0% residual stenosis and a 12 mm Onyx stent dilated to 3.5 mm in diameter at high pressure.  TIMI grade 0 to TIMI grade III flow noted. Widely patent left main; total occlusion ostial ramus which receives left to left collaterals; 90% distal circumflex beyond the third obtuse marginal prior to the origin of the PDA; and  nondominant right coronary. LVEDP 23 mmHg Mean PA pressure 17 mmHg with mean wedge pressure 10 mmHg. Cardiac index 1.71 L/min/m?; cardiac output 3.38 L/min; mixed venous O2 saturation from pulmonary artery 58%; aortic O2 saturation 98%. Acute left heart failure with cardiogenic shock based upon cardiac index although blood pressure is greater than 90 mmHg RECOMMENDATIONS: Continue milrinone Intra-aortic balloon pump at one-to-one Continue aspirin and Brilinta per NG tube while the patient is intubated Advanced Heart Failure team will follow.  ? ?DG CHEST PORT 1 VIEW ? ?Result Date: 08/12/2021 ?CLINICAL DATA:  Post intubation. EXAM: PORTABLE CHEST 1 VIEW COMPARISON:  July 23, 2021 FINDINGS: An ET tube has been placed in the interval, terminating in good position. A left PICC line has also been placed terminating in the central SVC. No pneumothorax. Bilateral pulmonary opacities with peripheral Kerley B lines and increased interstitial prominence. The heart size is borderline. The hila and mediastinum are unchanged. No other acute abnormalities. IMPRESSION: 1. Support apparatus as above. 2. Findings are most consistent with developing pulmonary edema. An infectious process is considered less likely. Electronically Signed   By: Dorise Bullion III M.D.   On: 07/29/2021 11:47  ? ?DG Abd Portable 1V ? ?Result Date: 07/23/2021 ?CLINICAL DATA:  Evaluate OG tube placement. EXAM: PORTABLE ABDOMEN - 1 VIEW COMPARISON:  None. FINDINGS: Enteric tube is identified with tip and side port below the level of the GE junction. The tip of the tube projects over the right hemiabdomen in the expected location of the distal stomach or proximal duodenum. Contrast material is identified within the collecting systems and ureters of both kidneys. There is a moderate to large stool burden noted within the colon. IMPRESSION: Enteric tube tip projects over the right hemiabdomen, in the distal stomach or proximal duodenum. Electronically Signed    By: Kerby Moors M.D.   On: 08/14/2021 16:23  ? ?ECHOCARDIOGRAM COMPLETE ? ?Result Date: 08/09/2021 ?   ECHOCARDIOGRAM REPORT   Patient Name:   LABRYAN RAMAKRISHNAN Date of Exam: 08/13/2021 Medical Rec #:  [id number]      Height:       69.0 in Accession #:    [id number]     Weight:       180.0 lb Date of Birth:  [birth year]       BSA:          1.976 m? Patient Age:    77 years       BP:           132/108 mmHg Patient Gender: M  HR:           106 bpm. Exam Location:  Inpatient Procedure: 2D Echo, Cardiac Doppler, Color Doppler and Intracardiac            Opacification Agent Indications:    Acute myocardial infarction  History:        Patient has prior history of Echocardiogram examinations, most                 recent 01/24/2020. Risk Factors:Diabetes, Hypertension and                 Current Smoker. Hx CVA and mural apical thrombus.  Sonographer:    Clayton Lefort RDCS (AE) Referring Phys: [id number] [facility]  Sonographer Comments: Technically difficult study due to poor echo windows. IMPRESSIONS  1. Left ventricular ejection fraction, by estimation, is <20%. The left ventricle has severely decreased function. The left ventricle demonstrates global hypokinesis. Left ventricular diastolic parameters are indeterminate. No LV thrombus. On the basal inferolateral segment is moving.  2. The aortic valve was not well visualized. Aortic valve regurgitation is not visualized. No aortic stenosis is present.  3. Right ventricular systolic function is low normal. The right ventricular size is normal.  4. The mitral valve is normal in structure. Mild mitral valve regurgitation. No evidence of mitral stenosis.  5. The inferior vena cava is normal in size with greater than 50% respiratory variability, suggesting right atrial pressure of 3 mmHg. Comparison(s): LV function is significantly worse. FINDINGS  Left Ventricle: Left ventricular ejection fraction, by estimation, is <20%. The left ventricle has severely decreased function.  The left ventricle demonstrates global hypokinesis. Definity contrast agent was given IV to delineate the left ventricular endocardial borders. The left ventricular internal cavity size was normal in size.

## 2021-07-25 NOTE — Anesthesia Preprocedure Evaluation (Addendum)
Anesthesia Evaluation  ?Patient identified by MRN, date of birth, ID band ?Patient unresponsive ? ? ? ?Reviewed: ?Allergy & Precautions, NPO status , Patient's Chart, lab work & pertinent test results ? ?Airway ?Mallampati: Intubated ? ? ? ? ? ? Dental ?  ?Pulmonary ?Current Smoker and Patient abstained from smoking.,  ?Intubated  ?  ? ? ? ?+ intubated ? ? ? Cardiovascular ?hypertension, Pt. on medications and Pt. on home beta blockers ?+ CAD, + Past MI, + Cardiac Stents and +CHF  ? ?Rhythm:Regular Rate:Tachycardia ? ?ECHO: ?Left ventricular ejection fraction, by estimation, is <20%. The left ventricle has severely decreased function. The left ventricle demonstrates global hypokinesis. Left ?ventricular diastolic parameters are indeterminate. No LV thrombus. On the basal inferolateral segment is moving. ?The aortic valve was not well visualized. Aortic valve regurgitation is not visualized. ?No aortic stenosis is present. ?Right ventricular systolic function is low normal. The right ventricular size is normal. ?The mitral valve is normal in structure. Mild mitral valve regurgitation. No evidence of mitral stenosis. ?The inferior vena cava is normal in size with greater than 50% respiratory variability, ?suggesting right atrial pressure of 3 mmHg. ?  ?Neuro/Psych ?PSYCHIATRIC DISORDERS Anxiety Depression Sedated ?CVA   ? GI/Hepatic ?negative GI ROS, Neg liver ROS,   ?Endo/Other  ?diabetes, Insulin Dependent ? Renal/GU ?Renal disease  ? ?  ?Musculoskeletal ?negative musculoskeletal ROS ?(+)  ? Abdominal ?  ?Peds ? Hematology ? ?(+) Blood dyscrasia, anemia ,   ?Anesthesia Other Findings ?SHOCK ? Reproductive/Obstetrics ? ?  ? ? ? ? ? ? ? ? ? ? ? ? ? ?  ?  ? ? ? ? ? ? ?Anesthesia Physical ?Anesthesia Plan ? ?ASA: 4 ? ?Anesthesia Plan: General  ? ?Post-op Pain Management:   ? ?Induction: Intravenous ? ?PONV Risk Score and Plan: 1 and Ondansetron, Dexamethasone, Midazolam and Treatment  may vary due to age or medical condition ? ?Airway Management Planned: Oral ETT ? ?Additional Equipment: TEE ? ?Intra-op Plan:  ? ?Post-operative Plan: Post-operative intubation/ventilation ? ?Informed Consent: I have reviewed the patients History and Physical, chart, labs and discussed the procedure including the risks, benefits and alternatives for the proposed anesthesia with the patient or authorized representative who has indicated his/her understanding and acceptance.  ? ? ? ?Consent reviewed with POA ? ?Plan Discussed with: CRNA ? ?Anesthesia Plan Comments: (TEE for intraoperative monitoring only ?IABP 1 : 1 ?Arterial line in place ?Central line in place ?PA catheter in place)  ? ? ? ? ?Anesthesia Quick Evaluation ? ?

## 2021-07-25 NOTE — Progress Notes (Signed)
? ?   ?  [address] ?      [place] [address] ?            [phone]   ? ?  ?S/p placement of Impella 5.5 ? ?Intubated, sedated ? ?Impella p7 with 3.9 L/min flow ?CI 2.1 PA 34/23 ? ?Creatinine 1.47, K= 4.5 ?Hct 33 ? ?Lost A line- CCM to place new one ? ?Salvatore Decent Dorris Fetch, MD ?[facility] ?[phone] ? ?

## 2021-07-25 NOTE — Consult Note (Signed)
? ?                 [name] [name] ?           [place] [address] ?         [id number]  ? ?   ? ?[name] [name] ?[place] Record #[id number] ?Date of Birth: 1970/05/19 ? ?No ref. provider found [name] [name] ?[facility], [place] ? ?Chief Complaint:    ?Chief Complaint  ?Patient presents with  ? Chest Pain  ?Patient examined, images of cardiac catheterization and 2D echocardiogram and recent chest x-ray personally reviewed. ? ?History of Present Illness:     ?51 year old diabetic[poorly controlled A1c 11.8] presented with acute MI, chest pain, and cardiogenic shock.  He also had diabetic foot ulcer with concern for sepsis.  He underwent cardiac catheterization yesterday with placement of a stent for ostial LAD occlusion as well as placement of a intra-aortic balloon pump.  He is currently intubated on inotropic support with marginal hemodynamics and rising creatinine.  The advanced heart failure team has recommended placement of a Impella 5.5 left ventricular assist device to optimize the chance for myocardial recovery and to improve endorgan perfusion.  Patient has been on Brilinta for the PCI as well as heparin for the balloon pump.  He has a low-grade fever and chest x-ray shows some bilateral interstitial edema.  His FiO2 is 40% and his bicarb level is low normal.  LFTs are mildly elevated and creatinine 1.8. ?Balloon pump augmentation is one-to-one.  There is a Doppler pulse present in the left foot, balloon pump in left femoral artery. ? ?He has shown no evidence of severe neurologic deficit when the sedation is lightened up. ? ? ?Current Activity/ Functional Status: ?Completely incapacitated , reliant on medical care  ?  ?Past Medical History:  ?Diagnosis Date  ? Diabetes mellitus without complication ([place])   ? Hypertension   ? Stroke [facility])   ? ? ?Past Surgical History:  ?Procedure Laterality Date  ? APPENDECTOMY    ? BACK SURGERY    ? CORONARY STENT INTERVENTION N/A 08/19/2021  ?  Procedure: CORONARY STENT INTERVENTION;  Surgeon: Belva Crome, MD;  Location: [place] [facility];  Service: Cardiovascular;  Laterality: N/A;  ? CORONARY/GRAFT ACUTE MI REVASCULARIZATION N/A 01/23/2020  ? Procedure: Coronary/Graft Acute MI Revascularization;  Surgeon: Yolonda Kida, MD;  Location: [place] [facility];  Service: Cardiovascular;  Laterality: N/A;  ? LEFT HEART CATH AND CORONARY ANGIOGRAPHY N/A 01/23/2020  ? Procedure: LEFT HEART CATH AND CORONARY ANGIOGRAPHY;  Surgeon: Yolonda Kida, MD;  Location: [place] CV LAB;  Service: Cardiovascular;  Laterality: N/A;  ? RIGHT/LEFT HEART CATH AND CORONARY ANGIOGRAPHY N/A 07/26/2021  ? Procedure: RIGHT/LEFT HEART CATH AND CORONARY ANGIOGRAPHY;  Surgeon: Belva Crome, MD;  Location: [place] CV [facility];  Service: Cardiovascular;  Laterality: N/A;  ? ? ?Social History  ? ?Tobacco Use  ?Smoking Status Every Day  ? Packs/day: 0.25  ? Types: Cigarettes  ?Smokeless Tobacco Never  ?  ?Social History  ? ?Substance and Sexual Activity  ?Alcohol Use No  ? ? ?Social History  ? ?Socioeconomic History  ? Marital status: Divorced  ?  Spouse name: Not on file  ? Number of children: Not on file  ? Years of education: Not on file  ? Highest education level: Not on file  ?Occupational History  ? Not on file  ?Tobacco Use  ? Smoking status: Every Day  ?  Packs/day: 0.25  ?  Types: Cigarettes  ? Smokeless tobacco: Never  ?Vaping Use  ? Vaping Use: Never used  ?Substance and Sexual Activity  ? Alcohol use: No  ? Drug use: No  ? Sexual activity: Not on file  ?Other Topics Concern  ? Not on file  ?Social History Narrative  ? Not on file  ? ?Social Determinants of Health  ? ?Financial Resource Strain: Not on file  ?Food Insecurity: Not on file  ?Transportation Needs: Not on file  ?Physical Activity: Not on file  ?Stress: Not on file  ?Social Connections: Not on file  ?Intimate Partner Violence: Not on file  ? ? ?Allergies  ?Allergen Reactions  ? Penicillins Other (See  Comments) and Anaphylaxis  ?  Has patient had a PCN reaction causing immediate rash, facial/tongue/throat swelling, SOB or lightheadedness with hypotension: Yes ?Has patient had a PCN reaction causing severe rash involving mucus membranes or skin necrosis: No ?Has patient had a PCN reaction that required hospitalization: No ?Has patient had a PCN reaction occurring within the last 10 years: No ?If all of the above answers are "NO", then may proceed with Cephalosporin use. ?  ? Empagliflozin   ?  Other reaction(s): Urinary tract infectious disease  ? Liraglutide Nausea And Vomiting  ? Penicillin G Rash and Hives  ? ? ?Current Facility-Administered Medications  ?Medication Dose Route Frequency Provider Last Rate Last Admin  ? 0.9 %  sodium chloride infusion  250 mL Intravenous PRN Belva Crome, MD      ? 0.9 %  sodium chloride infusion   Intravenous PRN Larey Dresser, MD   Stopped at 08/16/2021 0137  ? 0.9 %  sodium chloride infusion   Intravenous PRN Larey Dresser, MD      ? acetaminophen (TYLENOL) tablet 650 mg  650 mg Oral Q4H PRN Marykay Lex, MD      ? alum & mag hydroxide-simeth (MAALOX/MYLANTA) 200-200-20 MG/5ML suspension 30 mL  30 mL Oral Q6H PRN Belva Crome, MD      ? amiodarone (NEXTERONE PREMIX) 360-4.14 MG/200ML-% (1.8 mg/mL) IV infusion  30 mg/hr Intravenous Continuous Belva Crome, MD      ? aspirin chewable tablet 81 mg  81 mg Per NG tube Daily Sinda Du A, MD   81 mg at 08/11/2021 0905  ? atorvastatin (LIPITOR) tablet 80 mg  80 mg Per NG tube Daily Belva Crome, MD   80 mg at 08/11/2021 6295  ? aztreonam (AZACTAM) 1 g in sodium chloride 0.9 % 100 mL IVPB  1 g Intravenous Q8H Belva Crome, MD   Stopped at 08/05/2021 2332  ? buPROPion [facility]) tablet 75 mg  75 mg Per Tube BID Sinda Du A, MD   75 mg at 08/16/2021 2223  ? cangrelor [facility]) 50 mg in sodium chloride 0.9 % 250 mL (0.2 mg/mL) infusion  0.75 mcg/kg/min Intravenous Continuous Larey Dresser, MD      ? chlorhexidine  gluconate (MEDLINE KIT) (PERIDEX) 0.12 % solution 15 mL  15 mL Mouth Rinse BID Belva Crome, MD   15 mL at 08/12/2021 0825  ? Chlorhexidine Gluconate Cloth 2 % PADS 6 each  6 each Topical Daily Belva Crome, MD   6 each at 08/16/2021 2144  ? dexmedetomidine (PRECEDEX) 400 MCG/100ML (4 mcg/mL) infusion  0-1.2 mcg/kg/hr Intravenous Continuous Belva Crome, MD 20.4 mL/hr at 08/16/2021 0800 1 mcg/kg/hr at 08/19/2021 0800  ? dextrose 50 % solution 0-50  mL  0-50 mL Intravenous PRN Belva Crome, MD      ? DOBUTamine (DOBUTREX) infusion 4000 mcg/mL  2.5 mcg/kg/min Intravenous Titrated Chandrasekhar, Mahesh A, MD 3.06 mL/hr at 08/19/2021 0800 2.5 mcg/kg/min at 08/16/2021 0800  ? docusate (COLACE) 50 MG/5ML liquid 100 mg  100 mg Per Tube BID Belva Crome, MD   100 mg at 08/17/2021 7116  ? fentaNYL (SUBLIMAZE) bolus via infusion 50-100 mcg  50-100 mcg Intravenous Q15 min PRN Belva Crome, MD      ? fentaNYL 2548mg in NS 253m(1041mml) infusion-PREMIX  0-450 mcg/hr Intravenous Continuous AgaKipp BroodD 30 mL/hr at 08/18/2021 0800 300 mcg/hr at 07/28/2021 0800  ? folic acid (FOLVITE) tablet 1 mg  1 mg Per Tube Daily Wilmot, Kobina A, MD   1 mg at 08/12/2021 0905  ? furosemide (LASIX) injection 40 mg  40 mg Intravenous BID McLLarey DresserD   40 mg at 08/12/2021 0846  ? Heparin (Porcine) in NaCl 1000-0.9 UT/500ML-% SOLN    PRN SmiBelva CromeD   500 mL at 08/16/2021 1303  ? heparin ADULT infusion 100 units/mL (25000 units/250m76m1,000 Units/hr Intravenous Continuous AmenFranky MachoH 10 mL/hr at 08/01/2021 0800 1,000 Units/hr at 08/19/2021 0800  ? insulin regular, human (MYXREDLIN) 100 units/ 100 mL infusion   Intravenous Continuous SmitBelva Crome   Paused at 08/17/2021 [phone] mouth rinse  15 mL Mouth Rinse BID SmitBelva Crome   15 mL at 08/06/2021 2144  ? MEDLINE mouth rinse  15 mL Mouth Rinse 10 times per day SmitBelva Crome   15 mL at 08/14/2021 0600  ? midazolam (VERSED) injection 2 mg  2 mg Intravenous Q15 min  PRN SmitBelva Crome   2 mg at 08/20/2021 1054  ? midazolam (VERSED) injection 2 mg  2 mg Intravenous Q2H PRN SmitBelva Crome      ? milrinone (PRIMACOR) 20 MG/100 ML (0.2 mg/mL) infusion  0.25 mcg/kg/min

## 2021-07-26 ENCOUNTER — Encounter (HOSPITAL_COMMUNITY): Payer: Self-pay | Admitting: Cardiothoracic Surgery

## 2021-07-26 ENCOUNTER — Inpatient Hospital Stay (HOSPITAL_COMMUNITY): Payer: No Typology Code available for payment source

## 2021-07-26 DIAGNOSIS — I519 Heart disease, unspecified: Secondary | ICD-10-CM

## 2021-07-26 DIAGNOSIS — Z95811 Presence of heart assist device: Secondary | ICD-10-CM

## 2021-07-26 DIAGNOSIS — R570 Cardiogenic shock: Secondary | ICD-10-CM | POA: Diagnosis not present

## 2021-07-26 DIAGNOSIS — I219 Acute myocardial infarction, unspecified: Secondary | ICD-10-CM

## 2021-07-26 DIAGNOSIS — I2510 Atherosclerotic heart disease of native coronary artery without angina pectoris: Secondary | ICD-10-CM

## 2021-07-26 DIAGNOSIS — I249 Acute ischemic heart disease, unspecified: Secondary | ICD-10-CM | POA: Diagnosis not present

## 2021-07-26 DIAGNOSIS — I5023 Acute on chronic systolic (congestive) heart failure: Secondary | ICD-10-CM | POA: Diagnosis not present

## 2021-07-26 LAB — COMPREHENSIVE METABOLIC PANEL
ALT: 69 U/L — ABNORMAL HIGH (ref 0–44)
AST: 194 U/L — ABNORMAL HIGH (ref 15–41)
Albumin: 1.8 g/dL — ABNORMAL LOW (ref 3.5–5.0)
Alkaline Phosphatase: 64 U/L (ref 38–126)
Anion gap: 7 (ref 5–15)
BUN: 24 mg/dL — ABNORMAL HIGH (ref 6–20)
CO2: 21 mmol/L — ABNORMAL LOW (ref 22–32)
Calcium: 8.3 mg/dL — ABNORMAL LOW (ref 8.9–10.3)
Chloride: 110 mmol/L (ref 98–111)
Creatinine, Ser: 1.4 mg/dL — ABNORMAL HIGH (ref 0.61–1.24)
GFR, Estimated: >60 mL/min (ref >60)
Glucose, Bld: 156 mg/dL — ABNORMAL HIGH (ref 70–99)
Potassium: 3.8 mmol/L (ref 3.5–5.1)
Sodium: 138 mmol/L (ref 135–145)
Total Bilirubin: 0.7 mg/dL (ref 0.3–1.2)
Total Protein: 5.3 g/dL — ABNORMAL LOW (ref 6.5–8.1)

## 2021-07-26 LAB — BASIC METABOLIC PANEL
Anion gap: 5 (ref 5–15)
BUN: 25 mg/dL — ABNORMAL HIGH (ref 6–20)
CO2: 20 mmol/L — ABNORMAL LOW (ref 22–32)
Calcium: 8.2 mg/dL — ABNORMAL LOW (ref 8.9–10.3)
Chloride: 112 mmol/L — ABNORMAL HIGH (ref 98–111)
Creatinine, Ser: 1.44 mg/dL — ABNORMAL HIGH (ref 0.61–1.24)
GFR, Estimated: 60 mL/min — ABNORMAL LOW (ref >60)
Glucose, Bld: 181 mg/dL — ABNORMAL HIGH (ref 70–99)
Potassium: 3.8 mmol/L (ref 3.5–5.1)
Sodium: 137 mmol/L (ref 135–145)

## 2021-07-26 LAB — CBC
HCT: 29.8 % — ABNORMAL LOW (ref 39.0–52.0)
Hemoglobin: 10 g/dL — ABNORMAL LOW (ref 13.0–17.0)
MCH: 28.5 pg (ref 26.0–34.0)
MCHC: 33.6 g/dL (ref 30.0–36.0)
MCV: 84.9 fL (ref 80.0–100.0)
Platelets: 192 10*3/uL (ref 150–400)
RBC: 3.51 MIL/uL — ABNORMAL LOW (ref 4.22–5.81)
RDW: 13.3 % (ref 11.5–15.5)
WBC: 15 10*3/uL — ABNORMAL HIGH (ref 4.0–10.5)
nRBC: 0 % (ref 0.0–0.2)

## 2021-07-26 LAB — GLUCOSE, CAPILLARY
Glucose-Capillary: 156 mg/dL — ABNORMAL HIGH (ref 70–99)
Glucose-Capillary: 162 mg/dL — ABNORMAL HIGH (ref 70–99)
Glucose-Capillary: 163 mg/dL — ABNORMAL HIGH (ref 70–99)
Glucose-Capillary: 165 mg/dL — ABNORMAL HIGH (ref 70–99)
Glucose-Capillary: 168 mg/dL — ABNORMAL HIGH (ref 70–99)
Glucose-Capillary: 169 mg/dL — ABNORMAL HIGH (ref 70–99)
Glucose-Capillary: 169 mg/dL — ABNORMAL HIGH (ref 70–99)
Glucose-Capillary: 175 mg/dL — ABNORMAL HIGH (ref 70–99)
Glucose-Capillary: 185 mg/dL — ABNORMAL HIGH (ref 70–99)
Glucose-Capillary: 186 mg/dL — ABNORMAL HIGH (ref 70–99)
Glucose-Capillary: 190 mg/dL — ABNORMAL HIGH (ref 70–99)
Glucose-Capillary: 197 mg/dL — ABNORMAL HIGH (ref 70–99)
Glucose-Capillary: 211 mg/dL — ABNORMAL HIGH (ref 70–99)
Glucose-Capillary: 215 mg/dL — ABNORMAL HIGH (ref 70–99)
Glucose-Capillary: 236 mg/dL — ABNORMAL HIGH (ref 70–99)
Glucose-Capillary: 244 mg/dL — ABNORMAL HIGH (ref 70–99)
Glucose-Capillary: 244 mg/dL — ABNORMAL HIGH (ref 70–99)
Glucose-Capillary: 245 mg/dL — ABNORMAL HIGH (ref 70–99)
Glucose-Capillary: 262 mg/dL — ABNORMAL HIGH (ref 70–99)
Glucose-Capillary: 278 mg/dL — ABNORMAL HIGH (ref 70–99)

## 2021-07-26 LAB — POCT I-STAT 7, (LYTES, BLD GAS, ICA,H+H)
Acid-base deficit: 4 mmol/L — ABNORMAL HIGH (ref 0.0–2.0)
Bicarbonate: 20.1 mmol/L (ref 20.0–28.0)
Calcium, Ion: 1.28 mmol/L (ref 1.15–1.40)
HCT: 28 % — ABNORMAL LOW (ref 39.0–52.0)
Hemoglobin: 9.5 g/dL — ABNORMAL LOW (ref 13.0–17.0)
O2 Saturation: 96 %
Patient temperature: 38
Potassium: 3.9 mmol/L (ref 3.5–5.1)
Sodium: 137 mmol/L (ref 135–145)
TCO2: 21 mmol/L — ABNORMAL LOW (ref 22–32)
pCO2 arterial: 33.5 mmHg (ref 32–48)
pH, Arterial: 7.39 (ref 7.35–7.45)
pO2, Arterial: 85 mmHg (ref 83–108)

## 2021-07-26 LAB — HEPARIN LEVEL (UNFRACTIONATED)
Heparin Unfractionated: <0.1 IU/mL — ABNORMAL LOW (ref 0.30–0.70)
Heparin Unfractionated: <0.1 IU/mL — ABNORMAL LOW (ref 0.30–0.70)

## 2021-07-26 LAB — LACTATE DEHYDROGENASE: LDH: 1308 U/L — ABNORMAL HIGH (ref 98–192)

## 2021-07-26 LAB — PHOSPHORUS
Phosphorus: 3.7 mg/dL (ref 2.5–4.6)
Phosphorus: 3.1 mg/dL (ref 2.5–4.6)

## 2021-07-26 LAB — COOXEMETRY PANEL
Carboxyhemoglobin: 1.7 % — ABNORMAL HIGH (ref 0.5–1.5)
Carboxyhemoglobin: 1.4 % (ref 0.5–1.5)
Methemoglobin: <0.7 % (ref 0.0–1.5)
Methemoglobin: <0.7 % (ref 0.0–1.5)
O2 Saturation: 55.5 %
O2 Saturation: 57.5 %
Total hemoglobin: 9.9 g/dL — ABNORMAL LOW (ref 12.0–16.0)
Total hemoglobin: 9.5 g/dL — ABNORMAL LOW (ref 12.0–16.0)

## 2021-07-26 LAB — MAGNESIUM
Magnesium: 1.9 mg/dL (ref 1.7–2.4)
Magnesium: 2.2 mg/dL (ref 1.7–2.4)

## 2021-07-26 LAB — ECHOCARDIOGRAM LIMITED
Height: 69 in
Weight: 2987.67 oz

## 2021-07-26 MED ORDER — TICAGRELOR 90 MG PO TABS
90.0000 mg | ORAL_TABLET | Freq: Two times a day (BID) | ORAL | Status: DC
  Administered 2021-07-26 – 2021-07-30 (×8): 90 mg
  Filled 2021-07-26 (×8): qty 1

## 2021-07-26 MED ORDER — FUROSEMIDE 10 MG/ML IJ SOLN
80.0000 mg | Freq: Two times a day (BID) | INTRAMUSCULAR | Status: DC
  Administered 2021-07-26: 80 mg via INTRAVENOUS
  Filled 2021-07-26: qty 8

## 2021-07-26 MED ORDER — TICAGRELOR 90 MG PO TABS
180.0000 mg | ORAL_TABLET | Freq: Once | ORAL | Status: AC
  Administered 2021-07-26: 180 mg
  Filled 2021-07-26: qty 2

## 2021-07-26 MED ORDER — ALBUMIN HUMAN 25 % IV SOLN
12.5000 g | Freq: Four times a day (QID) | INTRAVENOUS | Status: AC
  Administered 2021-07-26 (×3): 12.5 g via INTRAVENOUS
  Filled 2021-07-26 (×3): qty 50

## 2021-07-26 MED ORDER — HEPARIN (PORCINE) 25000 UT/250ML-% IV SOLN
400.0000 [IU]/h | INTRAVENOUS | Status: DC
  Administered 2021-07-26: 200 [IU]/h via INTRAVENOUS

## 2021-07-26 MED ORDER — POTASSIUM CHLORIDE 20 MEQ PO PACK
40.0000 meq | PACK | Freq: Once | ORAL | Status: AC
  Administered 2021-07-26: 40 meq
  Filled 2021-07-26: qty 2

## 2021-07-26 MED ORDER — MAGNESIUM SULFATE 2 GM/50ML IV SOLN
2.0000 g | Freq: Once | INTRAVENOUS | Status: AC
  Administered 2021-07-26: 2 g via INTRAVENOUS
  Filled 2021-07-26: qty 50

## 2021-07-26 MED ORDER — POTASSIUM CHLORIDE 10 MEQ/50ML IV SOLN
10.0000 meq | INTRAVENOUS | Status: AC
  Administered 2021-07-26 (×2): 10 meq via INTRAVENOUS
  Filled 2021-07-26 (×2): qty 50

## 2021-07-26 MED ORDER — SODIUM CHLORIDE 0.9 % IV SOLN
100.0000 mg | INTRAVENOUS | Status: DC
  Administered 2021-07-26 – 2021-07-29 (×4): 100 mg via INTRAVENOUS
  Filled 2021-07-26 (×2): qty 10
  Filled 2021-07-26 (×5): qty 5

## 2021-07-26 MED ORDER — ACETAMINOPHEN 160 MG/5ML PO SOLN
650.0000 mg | ORAL | Status: DC | PRN
  Administered 2021-07-26 – 2021-07-30 (×9): 650 mg
  Filled 2021-07-26 (×9): qty 20.3

## 2021-07-26 MED ORDER — TICAGRELOR 90 MG PO TABS: 90.0000 mg | ORAL_TABLET | Freq: Two times a day (BID) | ORAL | Status: DC

## 2021-07-26 MED ORDER — TICAGRELOR 90 MG PO TABS: 180.0000 mg | ORAL_TABLET | Freq: Once | ORAL | Status: DC

## 2021-07-26 NOTE — Progress Notes (Signed)
Pharmacy Antibiotic Note ? ?[name] [name] is a 51 y.o. male admitted on 08/02/2021 with  diabetic foot ulcer .  Pharmacy has been consulted for Vancomycin and Aztreonam dosing. Appears pt may have started doxycycline for this a few days ago. ? ?Pt febrile overnight, WBC elevated but stable, repeat BCx remain NGTD. Cr is improving with Impella support. ? ?Plan: ?Aztreonam 1gm IV q8h ?Vancomycin 1500 mg IV Q 24 hrs ? ? ?Height: 5\' 9"  (175.3 cm) ?Weight: 84.7 kg (186 lb 11.7 oz) ?IBW/kg (Calculated) : 70.7 ? ?Temp (24hrs), Avg:99.5 ?F (37.5 ?C), Min:97.5 ?F (36.4 ?C), Max:100.9 ?F (38.3 ?C) ? ?Recent Labs  ?Lab 07/27/2021 ?0017 08/18/2021 ?0350 08/16/2021 ?09/23/21 08/14/2021 ?0021 07/27/2021 ?09/24/21 08/07/2021 ?09/24/21 08/03/2021 ?1103 08/11/2021 ?1241 08/03/2021 ?1410 07/31/2021 ?1533 07/25/21 ?1737 07/26/21 ?0419  ?WBC 16.6*   < >  --   --  19.6* 19.5* 18.1*  --   --   --  14.6* 15.0*  ?CREATININE 1.54*  --  1.76* 1.62*  --  1.78* 1.65* 1.60* 1.50* 1.40* 1.52*  1.47* 1.40*  ?LATICACIDVEN 1.9  --  2.3* 1.6  --   --   --   --   --   --   --   --   ? < > = values in this interval not displayed.  ? ?  ?Estimated Creatinine Clearance: 63.8 mL/min (A) (by C-G formula based on SCr of 1.4 mg/dL (H)).   ? ?Allergies  ?Allergen Reactions  ? Penicillins Other (See Comments) and Anaphylaxis  ?  Has patient had a PCN reaction causing immediate rash, facial/tongue/throat swelling, SOB or lightheadedness with hypotension: Yes ?Has patient had a PCN reaction causing severe rash involving mucus membranes or skin necrosis: No ?Has patient had a PCN reaction that required hospitalization: No ?Has patient had a PCN reaction occurring within the last 10 years: No ?If all of the above answers are "NO", then may proceed with Cephalosporin use. ?  ? Empagliflozin Other (See Comments)  ?  Urinary tract infectious disease  ? Liraglutide Nausea And Vomiting  ? ? ?Antimicrobials this admission: ?4/1 Zosyn >>  ?4/1 Vanc >>  ? ?Microbiology results: ?3/28 BCx: negative ?4/2  BCx: NGTD ?4/2 MRSA PCR: positive ? ?Thank you for allowing pharmacy to be a part of this patient?s care. ? ?4/28, PharmD, BCPS, BCCP ?Clinical Pharmacist ?[phone] ?Please check AMION for all [facility] numbers ?07/26/2021 ? ? ?

## 2021-07-26 NOTE — Progress Notes (Signed)
ANTICOAGULATION CONSULT NOTE  ? ?Pharmacy Consult for heparin ?Indication: Impella ? ?Allergies  ?Allergen Reactions  ? Penicillins Other (See Comments) and Anaphylaxis  ?  Has patient had a PCN reaction causing immediate rash, facial/tongue/throat swelling, SOB or lightheadedness with hypotension: Yes ?Has patient had a PCN reaction causing severe rash involving mucus membranes or skin necrosis: No ?Has patient had a PCN reaction that required hospitalization: No ?Has patient had a PCN reaction occurring within the last 10 years: No ?If all of the above answers are "NO", then may proceed with Cephalosporin use. ?  ? Empagliflozin Other (See Comments)  ?  Urinary tract infectious disease  ? Liraglutide Nausea And Vomiting  ? ? ?Patient Measurements: ?Height: 5\' 9"  (175.3 cm) ?Weight: 84.7 kg (186 lb 11.7 oz) ?IBW/kg (Calculated) : 70.7 ?Heparin Dosing Weight: TBW ? ?Vital Signs: ?Temp: 101.3 ?F (38.5 ?C) (04/04 1930) ?Temp Source: Core (04/04 0800) ?Pulse Rate: 109 (04/04 1547) ? ?Labs: ?Recent Labs  ?  08/15/2021 ?2030 07/23/2021 ?0017 08/04/2021 ?0335 08/09/2021 ?0350 08/14/2021 ?1103 08/18/2021 ?1148 07/26/2021 ?1737 07/25/21 ?1946 07/26/21 ?[id number] 07/26/21 ?0419 07/26/21 ?[id number] 07/26/21 ?1700 07/26/21 ?1715  ?HGB 15.3 15.3  --    < > 11.8*   < > 11.1* 9.9* 9.5* 10.0*  --   --   --   ?HCT 44.7 45.3  --    < > 33.5*   < > 33.4* 29.0* 28.0* 29.8*  --   --   --   ?PLT 306 358  --    < > 208  --  189  --   --  192  --   --   --   ?APTT  --   --   --   --  49*  --   --   --   --   --   --   --   --   ?LABPROT  --   --   --   --  16.4*  --   --   --   --   --   --   --   --   ?INR  --   --   --   --  1.3*  --   --   --   --   --   --   --   --   ?HEPARINUNFRC  --  0.43  --    < > 0.12*  --   --   --   --   --  <0.10*  --  <0.10*  ?CREATININE 1.39* 1.54*  --    < > 1.65*   < > 1.52*  1.47*  --   --  1.40*  --  1.44*  --   ?TROPONINIHS 2,234* 3,[id number]*  --   --   --   --   --   --   --   --   --   --   ? < > = values in this  interval not displayed.  ? ? ? ?Estimated Creatinine Clearance: 62.1 mL/min (A) (by C-G formula based on SCr of 1.44 mg/dL (H)). ? ? ?Assessment: ?81 yoM admitted with ACS and cardiogenic shock. Pt s/p LHC with DES and IABP placement 4/2. IABP upgraded to Impella 5.5 4/3. ? ?Heparin infusing through purge at ~12.5 ml/h (625 units/h) and systemic heparin drip 200 uts/hr.  ?Total heparin infusion 825 uts/hr  ?Heparin level < 0.1 undetectable. ?Patient on 1000 uts/hr prior to procedure and heparin level  was only 0.12   ?   ?LDH trending down, CBC stable, JP drain and chest tube output stable. Slight oozing at impella insertion site resolved. Transitioned cangrelor infusion to ticagrelor.  ? ? ?Goal of Therapy:  ?Heparin level 0.2-0.5 units/ml  ?Monitor platelets by anticoagulation protocol: Yes ?  ?Plan:  ?Continue heparinized purge ?Increase  systemic heparin 300 units/h ? ? ? ?Bonnita Nasuti Pharm.D. CPP, BCPS ?Clinical Pharmacist ?[phone] ?07/26/2021 7:44 PM  ? ?Please check AMION for all [place] numbers ?07/26/2021 ? ? ? ?

## 2021-07-26 NOTE — Progress Notes (Signed)
ANTICOAGULATION CONSULT NOTE  ? ?Pharmacy Consult for heparin ?Indication: Impella ? ?Allergies  ?Allergen Reactions  ? Penicillins Other (See Comments) and Anaphylaxis  ?  Has patient had a PCN reaction causing immediate rash, facial/tongue/throat swelling, SOB or lightheadedness with hypotension: Yes ?Has patient had a PCN reaction causing severe rash involving mucus membranes or skin necrosis: No ?Has patient had a PCN reaction that required hospitalization: No ?Has patient had a PCN reaction occurring within the last 10 years: No ?If all of the above answers are "NO", then may proceed with Cephalosporin use. ?  ? Empagliflozin Other (See Comments)  ?  Urinary tract infectious disease  ? Liraglutide Nausea And Vomiting  ? ? ?Patient Measurements: ?Height: 5\' 9"  (175.3 cm) ?Weight: 84.7 kg (186 lb 11.7 oz) ?IBW/kg (Calculated) : 70.7 ?Heparin Dosing Weight: TBW ? ?Vital Signs: ?Temp: 100.9 ?F (38.3 ?C) (04/04 0745) ?Temp Source: Core (04/04 0400) ?BP: 89/75 (04/04 0400) ?Pulse Rate: 117 (04/04 0745) ? ?Labs: ?Recent Labs  ?  08/14/2021 ?2030 07-26-2021 ?0017 07/26/2021 ?0335 07/26/21 ?0350 08/07/2021 ?0231 08/11/2021 ?09/24/21 07/29/2021 ?1103 08/18/2021 ?1148 08/05/2021 ?1533 07/26/2021 ?1737 08/07/2021 ?1946 07/26/21 ?09/25/21 07/26/21 ?09/25/21 07/26/21 ?09/25/21  ?HGB 15.3 15.3  --    < >  --    < > 11.8*   < > 11.2* 11.1* 9.9* 9.5* 10.0*  --   ?HCT 44.7 45.3  --    < >  --    < > 33.5*   < > 33.0* 33.4* 29.0* 28.0* 29.8*  --   ?PLT 306 358  --    < >  --    < > 208  --   --  189  --   --  192  --   ?APTT  --   --   --   --   --   --  49*  --   --   --   --   --   --   --   ?LABPROT  --   --   --   --   --   --  16.4*  --   --   --   --   --   --   --   ?INR  --   --   --   --   --   --  1.3*  --   --   --   --   --   --   --   ?HEPARINUNFRC  --  0.43  --    < > <0.10*  --  0.12*  --   --   --   --   --   --  <0.10*  ?CREATININE 1.39* 1.54*  --    < >  --    < > 1.65*   < > 1.40* 1.52*  1.47*  --   --  1.40*  --   ?TROPONINIHS 2,234* 3,[id number]*  --   --   --   --   --   --   --   --   --   --   --   ? < > = values in this interval not displayed.  ? ? ? ?Estimated Creatinine Clearance: 63.8 mL/min (A) (by C-G formula based on SCr of 1.4 mg/dL (H)). ? ? ?Assessment: ?67 yoM admitted with ACS and cardiogenic shock. Pt s/p LHC with DES and IABP placement 4/2. IABP upgraded to Impella 5.5 4/3. ? ?Heparin infusing through purge  at ~12.6 ml/h (630 units/h). Pharmacy to start IV 4/4 am per Dr Donata Clay. Heparin level <0.1 on purge alone as expected. LDH trending down, CBC stable, JP drain and chest tube output stable. Transitioning cangrelor infusion to ticagrelor.  ? ? ?Goal of Therapy:  ?Heparin level 0.2-0.5 units/ml  ?Monitor platelets by anticoagulation protocol: Yes ?  ?Plan:  ?Continue heparinized purge ?Add systemic heparin 200 units/h ? ? ?Fredonia Highland, PharmD, BCPS, BCCP ?Clinical Pharmacist ?[phone] ?Please check AMION for all [facility] numbers ?07/26/2021 ? ? ? ?

## 2021-07-26 NOTE — Progress Notes (Signed)
? ?  Afternoon round  ? ?Brisk diuresis with IV lasix. CVP 7-8  ? ?Currently Impella at P8 with flow 4.7  ? On Norepi 16 mcg + Milrinone 0.125 mcg + Dobutamine 2.5 mcg.  ? ?Check CO-OX now. If CO-OX > 55%. Will stop milrinone.  ? ? ?Discussed with Dr Shirlee Latch.  ? ?[name] Lazaro NP-C  ?2:23 PM ? ? ?

## 2021-07-26 NOTE — Progress Notes (Addendum)
? ?NAME:  [name] [name], MRN:  [id number], DOB:  [birth year], LOS: 3 ?ADMISSION DATE:  2021-08-10, CONSULTATION DATE: 08/01/2021 ?REFERRING MD: Dr. Laren Everts, CHIEF COMPLAINT: Chest pain ? ?History of Present Illness:  ?This is a 51 year old white male who presented to the emergency room with new onset chest pain.  Patient states that he had substernal chest pain/pressure radiating to the left side of his chest.  He denies radiculopathy into the arm or into the jaw.  Prior to coming to the hospital he was short of breath and diaphoretic with chest pain.  He denies any nausea or vomiting.  Patient being seen at [facility] emergency room 2 days ago for a diabetic foot ulcer on the right foot.  He was sent home at that time on doxycycline.  His blood sugars generally out-of-control with a last hemoglobin A1c of 11.  Currently the patient states that he is a 1 out of 10 chest pressure.  Patient has a history of myocardial infarction with angioplasty in 2021.  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 ? ?Pertinent  Medical History  ?Uncontrolled diabetes mellitus type 2 ?Right great toe foot ulcer ?Myocardial infarction ?CVA ?Hypertension ? ?Significant Hospital Events: ?Including procedures, antibiotic start and stop dates in addition to other pertinent events   ?4/2 admitted with chest pain.  Suspect missed STEMI ?4/2 cardiac catheterization shows triple-vessel disease with total occlusion of an ostial LAD lesion with successful stent placement.  Continued occlusion of the distal LAD.  Elevated LVEDP at 24 mmHg ?4/2 insertion of intra-aortic balloon pump ?4/2 Foley catheter placed by urology.  Patient has prior stricture ?4/2 echocardiogram shows EF less than 20% with severely decreased LV function with global hypokinesis.  Inferolateral segment moves best. ?4/2 intubated for respiratory distress. ?4/3 Placement of Impella 5.5 left ventricular assist device.Removal  of intraaortic balloon pump from the left femoral artery. Placement of pulmonary artery Swan-Ganz catheter via right IJ. ? ? ?Subjective/overnight events:  ?Remains intubated. Still requiring high dose inotropic and vasopressor support despite insertion of Impella.  ? ?Objective   ?Blood pressure (!) 89/75, pulse (!) 117, temperature (!) 100.9 ?F (38.3 ?C), resp. rate 12, height 5\' 9"  (1.753 m), weight 84.7 kg, SpO2 98 %. ?PAP: (27-38)/(17-28) 38/28 ?CVP:  [7 mmHg-17 mmHg] 15 mmHg ?CO:  [4.1 L/min-4.2 L/min] 4.1 L/min ?CI:  [2.1 L/min/m2] 2.1 L/min/m2  ?Vent Mode: PRVC ?FiO2 (%):  [40 %] 40 % ?Set Rate:  [14 bmp] 14 bmp ?Vt Set:  [560 mL] 560 mL ?PEEP:  [5 cmH20] 5 cmH20 ?Plateau Pressure:  [18 cmH20-21 cmH20] 20 cmH20  ? ?Intake/Output Summary (Last 24 hours) at 07/26/2021 0815 ?Last data filed at 07/26/2021 0700 ?Gross per 24 hour  ?Intake 3940.27 ml  ?Output 2555 ml  ?Net 1385.27 ml  ? ? ?Filed Weights  ? 08-10-2021 2021 07/26/21 0500  ?Weight: 81.6 kg 84.7 kg  ? ? ?Examination: ?General: Appears older than stated age.  Intubated on fentanyl propofol for sedation. Still very pale.  ?HENT: ET tube OG tube in place. ?Lungs: Clear to auscultation bilaterally.  No wheezing rales or rhonchi noted. ?Cardiovascular: Tachycardic, no rub murmur gallop appreciated.  Impella in right subclavian.  Bleeding noted from JP site.  ?Abdomen: Soft, nontender, nondistended, no rebound/rigidity/guarding.  Positive bowel sounds. ?Extremities: No edema of the lower extremities.  Distal pulses are weakly present x4 extremities.  Temperature is uniform across all extremities proximal and distal.  The  right great toe on the dorsal surface has a 1.5 x 1.5 area of eschar.  No significant erythema.  There is no change in pallor.  Prior groin sites are intact.  ?Neuro: Currently sedated, not following commands ?Skin: No rash ? ?POC echo shows Impella in suboptimal position abutting the MV cordae. ? ?Assessment & Plan:  ?Critically ill due to acute  cardiogenic shock requiring titration of inotropes and vasopressors and management of mechanical cardiac support due to acute coronary syndrome with STEMI ?HFrEF EF estimated 10 to 15% ?Critically ill due to acute hypoxic respiratory failure requiring mechanical ventilation ?Peripheral vascular disease ?Right great toe ulceration with eschar in place ?Uncontrolled diabetes mellitus ?Hypertension ?Wide-complex tachycardia ? ?Plan: ?-Still requiring high dose pharmacologic support despite MCS. Agents may be working at cross purposes. ?- Try stopping milrinone, follow up ScvO2, may allow for vasopressor wean.  ?- Impella at P8 ?- Attempt to diurese prior to SBT. ?-Once hemodynamics are optimized and filling pressures improve with diuresis, can wean sedation and work towards extubation. ?- Continue antibiotics, may still have a distributive component from diabetic foot infection.  ?-Goal would eventually be to transition patient to guideline directed heart failure therapy as we await family to arrive from [place].  Patient does not have any good long-term salvage options apart from optimize medical therapy. ? ?Best Practice (right click and "Reselect all SmartList Selections" daily)  ? ?Diet/type: tubefeeds and NPO w/ oral meds -Will initiate tube feeds after surgery. ?DVT prophylaxis: systemic heparin ?GI prophylaxis: PPI ?Lines: N/A ?Foley:  N/A ?Code Status:  full code ?Last date of multidisciplinary goals of care discussion [family updated at bedside.  They are aware of poor prognosis and patient's prior health difficulties.] ? ?CRITICAL CARE ?Performed by: Lynnell Catalan ? ? ?Total critical care time: 40 minutes ? ?Critical care time was exclusive of separately billable procedures and treating other patients. ? ?Critical care was necessary to treat or prevent imminent or life-threatening deterioration. ? ?Critical care was time spent personally by me on the following activities: development of treatment plan with  patient and/or surrogate as well as nursing, discussions with consultants, evaluation of patient's response to treatment, examination of patient, obtaining history from patient or surrogate, ordering and performing treatments and interventions, ordering and review of laboratory studies, ordering and review of radiographic studies, pulse oximetry, re-evaluation of patient's condition and participation in multidisciplinary rounds. ? ?Lynnell Catalan, MD FRCPC ?ICU Physician ?[facility]  ?Pager: [phone] ?Mobile: [phone] ?After hours: [phone]. ? ? ? ? ?  ?

## 2021-07-26 NOTE — Progress Notes (Signed)
RT attempted to obtain sputum sample. Patient has scant amount of clear secretions. Unable to obtain sample at this time. Will leave sputum trap in line to try again. ?

## 2021-07-26 NOTE — TOC CM/SW Note (Signed)
Veteran's notification completed on 08/18/2021, auth # [id number]. Jonnie Finner RN3 CCM, Heart Failure TOC CM [phone]  ?

## 2021-07-26 NOTE — Progress Notes (Signed)
Cards Fellow textpaged requesting lyte replacement for K 3.8 Mag 1.9. Awaiting further orders. ?

## 2021-07-26 NOTE — TOC Initial Note (Signed)
Transition of Care (TOC) - Initial/Assessment Note  ? ? ?Patient Details  ?Name: [name] [name] ?MRN: [id number] ?Date of Birth: 04-Jul-1970 ? ?Transition of Care [facility]) CM/SW Contact:    ?[name] Grammes Rexene Alberts, RN ?Phone Number: [phone] ?07/26/2021, 12:33 PM ? ?Clinical Narrative:                 ?HF TOC CM spoke to pt's dtr, [name] [name] at bedside. States pt lives in home with roommate. States she lives in [place], but her brother here in [place] assist pt as needed. Pt was independent prior to hospital stay. Explained HF TOC CM/CSW will continue to follow for dc needs.  ? ?Expected Discharge Plan: [place] ?Barriers to Discharge: Continued Medical Work up ? ? ?Patient Goals and CMS Choice ?Patient states their goals for this hospitalization and ongoing recovery are:: wants father to get better ?CMS Medicare.gov Compare Post Acute Care list provided to:: Patient Represenative (must comment) (Daughter [name]) ?  ? ?Expected Discharge Plan and Services ?Expected Discharge Plan: [place] ?  ?Discharge Planning Services: CM Consult ?  ?Living arrangements for the past 2 months: [place] ?                ?  ?  ?  ?  ?  ?  ?  ?  ?  ?  ? ?Prior Living Arrangements/Services ?Living arrangements for the past 2 months: [place] ?Lives with:: Roommate ?Patient language and need for interpreter reviewed:: Yes ?       ?Need for Family Participation in Patient Care: Yes (Comment) ?Care giver support system in place?: Yes (comment) ?  ?Criminal Activity/Legal Involvement Pertinent to Current Situation/Hospitalization: No - Comment as needed ? ?Activities of Daily Living ?  ?  ? ?Permission Sought/Granted ?Permission sought to share information with : Case Manager, Family Supports, PCP ?  ?   ?   ?   ?   ? ?Emotional Assessment ?  ?Attitude/Demeanor/Rapport: Intubated (Following Commands or Not Following Commands) ?  ?  ?  ?Psych Involvement: No (comment) ? ?Admission diagnosis:  Precordial  chest pain [R07.2] ?Wide-complex tachycardia [R00.0] ?General weakness [R53.1] ?ACS (acute coronary syndrome) ([place]) [I24.9] ?Elevated troponin [R77.8] ?Patient Active Problem List  ? Diagnosis Date Noted  ? Cardiogenic shock ([place])   ? Elevated troponin   ? Acute on chronic systolic heart failure ([place])   ? Atherosclerosis of native coronary artery of native heart with angina pectoris ([place])   ? ACS (acute coronary syndrome) ([place]) 08/05/2021  ? Precordial chest pain   ? Wide-complex tachycardia   ? Uncontrolled type 2 diabetes mellitus with hyperglycemia ([place])   ? Severe sepsis ([place]) 01/09/2021  ? Acute lower UTI 01/09/2021  ? Hematuria 01/09/2021  ? STEMI involving left anterior descending coronary artery ([place]) 01/23/2020  ? STEMI (ST elevation myocardial infarction) ([place]) 01/23/2020  ? Non-STEMI (non-ST elevated myocardial infarction) ([place]) 04/12/2019  ? 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 Essential hypertension, benign 01/16/2017  ? Type II diabetes mellitus with neurological manifestations, uncontrolled 01/16/2017  ? Dyslipidemia associated with type 2 diabetes mellitus ([place]) 01/16/2017  ? Depression with anxiety 01/16/2017  ? Hemiparesis of dominant side due to recent cerebrovascular accident (CVA) ([place]) 01/04/2017  ? 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 Recent cerebrovascular accident (CVA) 01/04/2017  ? Stroke [facility]) -  acute infarct at left PLIC, likely small vessel disease due to poorly controlled diabetes and hyperlipidemia. 12/25/2016  ?  Chronic cerebrovascular accident (CVA) 12/25/2016  ? ?PCP:  [place] [place]:   ?[place], [address] ?[address] ?[place] [address] ?Phone: [phone] Fax: [phone] ? ?[place] [place] ?[address] ?[place] [address] ?Phone: [phone] Fax: [phone] ? ?[facility] ?[address] ?[place]  [address] ?Phone: [phone] Fax: [phone] ? ? ? ? ?Social Determinants of Health (SDOH) Interventions ?  ? ?Readmission Risk Interventions ?   ? View : No data to display.  ?  ?  ?  ? ? ? ?

## 2021-07-26 NOTE — Progress Notes (Addendum)
Patient ID: [name] [name], male   DOB: 05/09/1970, 51 y.o.   MRN: [id number] ?  ? ? Advanced Heart Failure Rounding Note ? ?PCP-Cardiologist: None  ? ?Subjective:   ?4/2 LHC : Occluded ostial LAD (culprit), occluded ramus with collaterals, 90% distal LCx prior to left PDA.  PCI to ostial LAD with DES.  ? ?4/3 IABP removed. S/P Impella 5.5. Diuresed with IV lasix. NSVT with amio bolus.  ? ?Remains on Dobutamine 2.5 mcg, Milrinone 0.25 mcg, Norepi 12 mcg, and amio drip.  ? ?Impella 5.5 P7 Flow 4.2  ? ?Overnight T Max 100.9. WBC 15. On vanc and azactam. Bld CX- NGTD.  ? ?Sedated on vent.  ? ?CO-OX 55%.  ?LDH 1510>1308 ?Creatinine 1.7>1.4   ? ?Swan numbers: ?PAP: (27-46)/(17-28) 34/24 ?CVP:  [7 mmHg-17 mmHg] 12 mmHg ?CO:  [4 L/min-4.2 L/min] 4.1 L/min ?CI:  [2 L/min/m2-2.1 L/min/m2] 2.1 L/min/m2 ?  ? ? ?Objective:   ?Weight Range: ?84.7 kg ?Body mass index is 27.58 kg/m?.  ? ?Vital Signs:   ?Temp:  [97.5 ?F (36.4 ?C)-100.9 ?F (38.3 ?C)] 100.9 ?F (38.3 ?C) (04/04 0700) ?Pulse Rate:  [111-147] 118 (04/04 0700) ?Resp:  [0-19] 14 (04/04 0700) ?BP: (54-156)/(28-93) 89/75 (04/04 0400) ?SpO2:  [89 %-100 %] 97 % (04/04 0700) ?Arterial Line BP: (66-146)/(52-105) 99/78 (04/04 0700) ?FiO2 (%):  [40 %] 40 % (04/04 0409) ?Weight:  [84.7 kg] 84.7 kg (04/04 0500) ?Last BM Date : 08/19/2021 ? ?Weight change: ?Filed Weights  ? 08/05/2021 2021 07/26/21 0500  ?Weight: 81.6 kg 84.7 kg  ? ? ?Intake/Output:  ? ?Intake/Output Summary (Last 24 hours) at 07/26/2021 0707 ?Last data filed at 07/26/2021 0700 ?Gross per 24 hour  ?Intake 4017.92 ml  ?Output 2585 ml  ?Net 1432.92 ml  ?  ? ? ?Physical Exam  ? General:  Intubated ?HEENT: ETT ?Neck: supple. JVP 9-10  Carotids 2+ bilat; no bruits. No lymphadenopathy or thryomegaly appreciated. ?Cor: PMI nondisplaced. Tachy Regular rate & rhythm. No rubs, gallops or murmurs. R axillary Impella + JP dressing saturated ?Lungs: clear ?Abdomen: soft, nontender, nondistended. No hepatosplenomegaly. No bruits or masses.  Good bowel sounds. ?Extremities: no cyanosis, clubbing, rash, R and LLE 1+ edema. R great toe tip black. LUE PICC ?Neuro: intubated/sedated  ?GU: foley yellow urine.  ? ? ?Telemetry  ?Sinus Tach with occasional PVCs ? ?Labs  ?  ?CBC ?Recent Labs  ?  07/28/2021 ?0438 08/15/2021 ?1103 08/03/2021 ?1737 07/31/2021 ?1946 07/26/21 ?6578 07/26/21 ?0419  ?WBC 19.5*   < > 14.6*  --   --  15.0*  ?NEUTROABS 15.1*  --  11.4*  --   --   --   ?HGB 12.1*   < > 11.1*   < > 9.5* 10.0*  ?HCT 35.5*   < > 33.4*   < > 28.0* 29.8*  ?MCV 84.5   < > 84.6  --   --  84.9  ?PLT 257   < > 189  --   --  192  ? < > = values in this interval not displayed.  ? ?Basic Metabolic Panel ?Recent Labs  ?  08/14/2021 ?1737 08/11/2021 ?1946 07/26/21 ?4696 07/26/21 ?0419  ?NA 136  137   < > 137 138  ?K 4.5  4.5   < > 3.9 3.8  ?CL 108  109  --   --  110  ?CO2 20*  19*  --   --  21*  ?GLUCOSE 164*  161*  --   --  156*  ?  BUN 27*  27*  --   --  24*  ?CREATININE 1.52*  1.47*  --   --  1.40*  ?CALCIUM 8.4*  8.4*  --   --  8.3*  ?MG 2.1  --   --  1.9  ?PHOS 5.0*  --   --  3.7  ? < > = values in this interval not displayed.  ? ?Liver Function Tests ?Recent Labs  ?  08/02/2021 ?1737 07/26/21 ?0419  ?AST 290* 194*  ?ALT 85* 69*  ?ALKPHOS 64 64  ?BILITOT 0.9 0.7  ?PROT 5.5* 5.3*  ?ALBUMIN 1.9* 1.8*  ? ?No results for input(s): LIPASE, AMYLASE in the last 72 hours. ?Cardiac Enzymes ?No results for input(s): CKTOTAL, CKMB, CKMBINDEX, TROPONINI in the last 72 hours. ? ?BNP: ?BNP (last 3 results) ?Recent Labs  ?  08/16/2021 ?0017  ?BNP 431.0*  ? ? ?ProBNP (last 3 results) ?No results for input(s): PROBNP in the last 8760 hours. ? ? ?D-Dimer ?Recent Labs  ?  08/01/2021 ?2030  ?DDIMER 2.08*  ? ?Hemoglobin A1C ?Recent Labs  ?  08/12/2021 ?0017  ?HGBA1C 11.8*  ? ?Fasting Lipid Panel ?Recent Labs  ?  07/31/2021 ?0017  ?CHOL 198  ?HDL 42  ?LDLCALC 131*  ?TRIG 124  ?CHOLHDL 4.7  ? ?Thyroid Function Tests ?No results for input(s): TSH, T4TOTAL, T3FREE, THYROIDAB in the last 72  hours. ? ?Invalid input(s): FREET3 ? ?Other results: ? ? ?Imaging  ? ? ?DG Chest Port 1 View ? ?Result Date: 08/03/2021 ?CLINICAL DATA:  Orogastric tube placement.  Pneumothorax. EXAM: PORTABLE CHEST 1 VIEW COMPARISON:  08/17/2021 FINDINGS: Endotracheal tube is in place, tip approximately 5.6 centimeters above the carina. New RIGHT-sided Swan-Ganz catheter tip overlies the level of the RIGHT main pulmonary artery. Nasogastric tube has been removed. Swan-Ganz catheter with inferior vena cava approach appears stable. There has been interval placement of Impella LEFT ventricular assist device. LEFT-sided PICC line tip overlies the UPPER RIGHT atrium. Heart size is normal. There has been some improvement in aeration of the LEFT lung base. There is persistent opacification MEDIAL aspect of the LEFT hemidiaphragm. Minimal patchy scattered opacities are identified within the lungs and appear stable. No pneumothorax. IMPRESSION: Interval placement of RIGHT-sided Swan-Ganz catheter and Impella device. Improved aeration in the LEFT lung base. Electronically Signed   By: Nolon Nations M.D.   On: 07/28/2021 16:49  ? ?DG CHEST PORT 1 VIEW ? ?Result Date: 08/13/2021 ?CLINICAL DATA:  Congestive heart failure EXAM: PORTABLE CHEST 1 VIEW COMPARISON:  Radiograph 08/13/2021 FINDINGS: Endotracheal tube unchanged. Introduction of NG tube which extends through the esophagus below the margin the film. PICC line unchanged. New central venous line from an inferior approach with tip in the main pulmonary artery. Aortic balloon pump tip noted in the proximal descending thoracic aorta. Mild central venous congestion. No overt pulmonary edema no pneumothorax. IMPRESSION: 1. Support apparatus in good position as above. 2. Mild central venous congestion.  No overt pulmonary edema Electronically Signed   By: Suzy Bouchard M.D.   On: 08/19/2021 09:19  ? ?DG Abd Portable 1V ? ?Result Date: 08/06/2021 ?CLINICAL DATA:  Orogastric tube placement. EXAM:  PORTABLE ABDOMEN - 1 VIEW COMPARISON:  08/04/2021 FINDINGS: Endotracheal tube in good position, 4.2 centimeters above the carina. Nasogastric tube is in place, side port in the region of the gastroesophageal junction. The tip of the catheter is not imaged. RIGHT IJ Swan-Ganz catheter tip overlies the RIGHT main pulmonary artery. Inferior vena cava Swan-Ganz catheter tip  overlies the pulmonary outflow tract. Impella device. LEFT-sided PICC line catheter is unchanged. Loop recorder overlies the LEFT chest. IMPRESSION: Nasogastric tube tip off the image, side port in the region the gastroesophageal junction. Electronically Signed   By: Nolon Nations M.D.   On: 07/31/2021 16:51  ? ?DG C-Arm 1-60 Min-No Report ? ?Result Date: 08/10/2021 ?Fluoroscopy was utilized by the requesting physician.  No radiographic interpretation.  ? ?DG C-Arm 1-60 Min-No Report ? ?Result Date: 08/13/2021 ?Fluoroscopy was utilized by the requesting physician.  No radiographic interpretation.  ? ?DG C-Arm 1-60 Min-No Report ? ?Result Date: 08/10/2021 ?Fluoroscopy was utilized by the requesting physician.  No radiographic interpretation.   ? ? ?Medications:   ? ? ?Scheduled Medications: ? aspirin  81 mg Per NG tube Daily  ? atorvastatin  80 mg Per NG tube Daily  ? buPROPion  75 mg Per Tube BID  ? chlorhexidine gluconate (MEDLINE KIT)  15 mL Mouth Rinse BID  ? Chlorhexidine Gluconate Cloth  6 each Topical Daily  ? docusate  100 mg Per Tube BID  ? feeding supplement (PROSource TF)  45 mL Per Tube QID  ? folic acid  1 mg Per Tube Daily  ? furosemide  40 mg Intravenous BID  ? mouth rinse  15 mL Mouth Rinse BID  ? mouth rinse  15 mL Mouth Rinse 10 times per day  ? multivitamin with minerals  1 tablet Per Tube Daily  ? mupirocin ointment  1 application. Nasal BID  ? pantoprazole (PROTONIX) IV  40 mg Intravenous Q24H  ? QUEtiapine  25 mg Per Tube BID  ? sodium chloride flush  10-40 mL Intracatheter Q12H  ? sodium chloride flush  3 mL Intravenous Q12H   ? ? ?Infusions: ? sodium chloride    ? sodium chloride 10 mL/hr at 07/26/21 0700  ? sodium chloride    ? amiodarone 30 mg/hr (07/26/21 0700)  ? aztreonam Stopped (07/26/21 0001)  ? cangrelor 50 mg in NS 250 mL 0.75 mcg/kg/min (

## 2021-07-26 NOTE — Progress Notes (Signed)
?  Echocardiogram ?2D Echocardiogram has been performed. ? ?Janalyn Harder ?07/26/2021, 9:58 AM ?

## 2021-07-26 NOTE — Op Note (Signed)
NAME: [name], [name] ?MEDICAL RECORD NO: [id number] ?ACCOUNT NO: [id number] ?DATE OF BIRTH: Jan 16, 1971 ?FACILITY: [facility] ?LOCATION: [facility] ?PHYSICIAN: Len Childs, MD ? ?Operative Report  ? ?DATE OF PROCEDURE: 07/24/2021 ? ?PROCEDURES PERFORMED:    ?1.  Placement of Impella 5.5 left ventricular assist device. ?2.  Removal of intraaortic balloon pump from the left femoral artery. ?3.  Placement of pulmonary artery Swan-Ganz catheter via right IJ. ? ?SURGEON:  Ivin Poot, M.D. ? ?CO-SURGEON:  Loralie Champagne, MD ? ?ANESTHESIA:  General. ? ?PREOPERATIVE DIAGNOSES:   ?1.  Acute myocardial infarction with LAD occlusion, opened with PCI. ?2.  Post-MI cardiogenic shock with marginal hemodynamics on balloon pump and pressors. ?3.  Poorly controlled diabetes mellitus type 2. ? ?POSTOPERATIVE DIAGNOSES:   ?1.  Acute myocardial infarction with LAD occlusion, opened with PCI. ?2.  Post-MI cardiogenic shock with marginal hemodynamics on balloon pump and pressors. ?3.  Poorly controlled diabetes mellitus type 2 ? ?CLINICAL NOTE:  I was asked to evaluate this 51 year old diabetic smoker for escalation in his mechanical cardiac support after he presented in cardiogenic shock with an anterior MI from LAD occlusion.  The LAD was opened with a stent and balloon pump  ?was placed.  However, his hemodynamics still remained borderline with rising creatinine and significant pressor requirements.  His cardiologist recommended a percutaneous placement of Impella 5.5 ventricular assist device to optimize the chance for LV  ?recovery and to help support end organ function.  I agreed with that recommendation and I spoke with the patient's family for informed consent, since the patient was intubated and sedated.  I discussed the benefits and risks of the procedure with the  ?patient's family. ? ?DESCRIPTION OF PROCEDURE:  The patient was brought to the operating room and placed supine on the operating table.  He remained stable.  A  transesophageal echo probe was placed by the anesthesia team.  The patient's chest, abdomen, and legs to the knees  ?were prepped and draped as a sterile field.  A proper timeout was performed. ? ?An incision was made beneath the right clavicle and the pectoralis major muscles were split.  A retractor was placed and the pectoralis minor muscle was retracted laterally.  The axillary artery was dissected from the surrounding vein and nerve  ?structures and encircled with a vessel loop.  Heparin 5000 units was administered and the artery was clamped proximally and distally with vascular clamps.  An incision was made.  A 10 mm woven Gelweave graft was sewn end-to-side with running 7-0 Prolene  ?and the clamps were then released and air was flushed from the graft. ? ?Next, the Impella 5.5 procedure tray was brought to the field and opened.  The introducer was placed into the graft and secured with the clasps.  Through the introducer, a 0.035 J-wire was passed under C-arm fluoroscopy into the left ventricle.  This was ? performed by Dr. [name].  The subsequent pigtail catheter and wire placements and placement of the Impella catheter was performed by Dr. [name].  The 0.035 wire was covered with a pigtail catheter and the wire was removed.  A 0.17 wire was then  ?inserted through the pigtail and the pigtail was removed.  Over the last wire, the Impella catheter was inserted.  It was difficult to place because of the tortuosity of the axillary, subclavian and innominate arteries.Also the LV was oriented horizontally.  After adequate placement, the  ?catheter was set at P7speed with a flow of 4.0  liters.  Positioning was optimized using TEE as well as the C-arm fluoroscopy. ? ?The sheath was then removed and the introducer inserted into the graft and secured with several heavy silk ties and the introducer was secured to the skin with four 2-0 silk sutures.  The wound was irrigated and a 15-French round JP drain was placed  beneath  ?the pectoralis muscle layer.  The pectoralis muscle was closed with interrupted #1 Vicryl.  Subcutaneous layer was closed with interrupted 3-0 Vicryl and the skin was closed with skin staples.  A sterile dressing was applied. ? ?After this was performed, the intraaortic balloon pump was removed from the left femoral artery.  The sutures for the introducer were removed and the balloon pump was pulled back into the introducer and then the introducer and the balloon pump removed in ? one unit.  Pressure was held on the groin for 30 minutes with good hemostasis. ? ?Next, a PA catheter was placed into the right IJ site under sterile prep and drape using Seldinger technique.  The  catheter balloon was advanced under hemodynamic monitoring until it reached the pulmonary artery.  It was secured to the skin and covered with a  ?sterile dressing.  Followup chest x-ray showed the PA catheter being in good position. ? ?The patient then returned to the ICU in a critical but stable condition. ? ? ?MUK ?D: 07/26/2021 5:14:24 pm T: 07/26/2021 1:52:00 am  ?JOB: [id number]  ?

## 2021-07-26 NOTE — Progress Notes (Addendum)
1 Day Post-Op Procedure(s) (LRB): ?PLACEMENT OF IMPELLA 5.5 LEFT VENTRICULAR ASSIST DEVICE (N/A) ?TRANSESOPHAGEAL ECHOCARDIOGRAM (TEE) (N/A) ?Subjective: ?Impella placed  08/12/2021 for posr MI cardiogenic shock ?Stable VAD flows on P7 - 4.2 liters/min ?CXR clear, Echo with adequate Impella placement ? ?Objective: ?Vital signs in last 24 hours: ?Temp:  [97.5 ?F (36.4 ?C)-100.9 ?F (38.3 ?C)] 100.9 ?F (38.3 ?C) (04/04 0745) ?Pulse Rate:  [111-147] 117 (04/04 0745) ?Cardiac Rhythm: Sinus tachycardia (04/04 0000) ?Resp:  [0-19] 12 (04/04 0745) ?BP: (54-156)/(28-93) 89/75 (04/04 0400) ?SpO2:  [89 %-100 %] 98 % (04/04 0745) ?Arterial Line BP: (66-146)/(59-105) 106/84 (04/04 0745) ?FiO2 (%):  [40 %] 40 % (04/04 0409) ?Weight:  [84.7 kg] 84.7 kg (04/04 0500) ? ?Hemodynamic parameters for last 24 hours: ?PAP: (27-38)/(17-28) 38/28 ?CVP:  [7 mmHg-17 mmHg] 15 mmHg ?CO:  [4.1 L/min-4.2 L/min] 4.1 L/min ?CI:  [2.1 L/min/m2] 2.1 L/min/m2 ? ?Intake/Output from previous day: ?04/03 0701 - 04/04 0700 ?In: 4017.9 [I.V.:2717.9; Blood:315; NG/GT:197.3; IV Piggyback:617.3] ?Out: 2595 [Urine:2110; Emesis/NG output:450; Drains:10; Blood:25] ?Intake/Output this shift: ?No intake/output data recorded. ? ?Sedated on vent ?Impella dressing changed and suture placed around drain exit site for skin bleeder ?Impella catheter is secure ?Lungs clear ?Sinus tach ?Lab Results: ?Recent Labs  ?  07/24/2021 ?1737 07/29/2021 ?1946 07/26/21 ?[id number] 07/26/21 ?0419  ?WBC 14.6*  --   --  15.0*  ?HGB 11.1*   < > 9.5* 10.0*  ?HCT 33.4*   < > 28.0* 29.8*  ?PLT 189  --   --  192  ? < > = values in this interval not displayed.  ? ?BMET:  ?Recent Labs  ?  08/08/2021 ?1737 08/21/2021 ?1946 07/26/21 ?[id number] 07/26/21 ?0419  ?NA 136  137   < > 137 138  ?K 4.5  4.5   < > 3.9 3.8  ?CL 108  109  --   --  110  ?CO2 20*  19*  --   --  21*  ?GLUCOSE 164*  161*  --   --  156*  ?BUN 27*  27*  --   --  24*  ?CREATININE 1.52*  1.47*  --   --  1.40*  ?CALCIUM 8.4*  8.4*  --   --  8.3*   ? < > = values in this interval not displayed.  ?  ?PT/INR:  ?Recent Labs  ?  08/09/2021 ?1103  ?LABPROT 16.4*  ?INR 1.3*  ? ?ABG ?   ?Component Value Date/Time  ? PHART 7.390 07/26/2021 0418  ? HCO3 20.1 07/26/2021 0418  ? TCO2 21 (L) 07/26/2021 0418  ? ACIDBASEDEF 4.0 (H) 07/26/2021 0418  ? O2SAT 55.5 07/26/2021 0419  ? ?CBG (last 3)  ?Recent Labs  ?  07/26/21 ?0320 07/26/21 ?[id number] 07/26/21 ?[id number]  ?GLUCAP 169* 165* 156*  ? ? ?Assessment/Plan: ?S/P Procedure(s) (LRB): ?PLACEMENT OF IMPELLA 5.5 LEFT VENTRICULAR ASSIST DEVICE (N/A) ?TRANSESOPHAGEAL ECHOCARDIOGRAM (TEE) (N/A) ?Cont heparin thru purge and po Brillinta for anticoagulation ?Diuresis before attempt at vent wean ?Tube feeds, Cortrak tomorrow ?Cont p* VAD speed ? LOS: 3 days  ? ? ?Dahlia Byes ?07/26/2021 ?  ?

## 2021-07-27 ENCOUNTER — Inpatient Hospital Stay (HOSPITAL_COMMUNITY): Payer: No Typology Code available for payment source

## 2021-07-27 DIAGNOSIS — I249 Acute ischemic heart disease, unspecified: Secondary | ICD-10-CM | POA: Diagnosis not present

## 2021-07-27 DIAGNOSIS — I5023 Acute on chronic systolic (congestive) heart failure: Secondary | ICD-10-CM | POA: Diagnosis not present

## 2021-07-27 DIAGNOSIS — R570 Cardiogenic shock: Secondary | ICD-10-CM | POA: Diagnosis not present

## 2021-07-27 LAB — COOXEMETRY PANEL
Carboxyhemoglobin: 0.9 % (ref 0.5–1.5)
Carboxyhemoglobin: 1.2 % (ref 0.5–1.5)
Methemoglobin: <0.7 % (ref 0.0–1.5)
Methemoglobin: <0.7 % (ref 0.0–1.5)
O2 Saturation: 54.3 %
O2 Saturation: 69.2 %
Total hemoglobin: 8.4 g/dL — ABNORMAL LOW (ref 12.0–16.0)
Total hemoglobin: 9.4 g/dL — ABNORMAL LOW (ref 12.0–16.0)

## 2021-07-27 LAB — CBC
HCT: 24.5 % — ABNORMAL LOW (ref 39.0–52.0)
Hemoglobin: 7.7 g/dL — ABNORMAL LOW (ref 13.0–17.0)
MCH: 27.9 pg (ref 26.0–34.0)
MCHC: 31.4 g/dL (ref 30.0–36.0)
MCV: 88.8 fL (ref 80.0–100.0)
Platelets: 128 10*3/uL — ABNORMAL LOW (ref 150–400)
RBC: 2.76 MIL/uL — ABNORMAL LOW (ref 4.22–5.81)
RDW: 13.9 % (ref 11.5–15.5)
WBC: 8.8 10*3/uL (ref 4.0–10.5)
nRBC: 0 % (ref 0.0–0.2)

## 2021-07-27 LAB — GLUCOSE, CAPILLARY
Glucose-Capillary: 195 mg/dL — ABNORMAL HIGH (ref 70–99)
Glucose-Capillary: 212 mg/dL — ABNORMAL HIGH (ref 70–99)
Glucose-Capillary: 161 mg/dL — ABNORMAL HIGH (ref 70–99)
Glucose-Capillary: 161 mg/dL — ABNORMAL HIGH (ref 70–99)
Glucose-Capillary: 153 mg/dL — ABNORMAL HIGH (ref 70–99)
Glucose-Capillary: 202 mg/dL — ABNORMAL HIGH (ref 70–99)
Glucose-Capillary: 227 mg/dL — ABNORMAL HIGH (ref 70–99)
Glucose-Capillary: 182 mg/dL — ABNORMAL HIGH (ref 70–99)
Glucose-Capillary: 176 mg/dL — ABNORMAL HIGH (ref 70–99)
Glucose-Capillary: 192 mg/dL — ABNORMAL HIGH (ref 70–99)
Glucose-Capillary: 180 mg/dL — ABNORMAL HIGH (ref 70–99)
Glucose-Capillary: 149 mg/dL — ABNORMAL HIGH (ref 70–99)
Glucose-Capillary: 140 mg/dL — ABNORMAL HIGH (ref 70–99)
Glucose-Capillary: 144 mg/dL — ABNORMAL HIGH (ref 70–99)
Glucose-Capillary: 131 mg/dL — ABNORMAL HIGH (ref 70–99)
Glucose-Capillary: 122 mg/dL — ABNORMAL HIGH (ref 70–99)
Glucose-Capillary: 149 mg/dL — ABNORMAL HIGH (ref 70–99)
Glucose-Capillary: 166 mg/dL — ABNORMAL HIGH (ref 70–99)
Glucose-Capillary: 167 mg/dL — ABNORMAL HIGH (ref 70–99)

## 2021-07-27 LAB — COMPREHENSIVE METABOLIC PANEL
ALT: 63 U/L — ABNORMAL HIGH (ref 0–44)
AST: 109 U/L — ABNORMAL HIGH (ref 15–41)
Albumin: 2.1 g/dL — ABNORMAL LOW (ref 3.5–5.0)
Alkaline Phosphatase: 91 U/L (ref 38–126)
Anion gap: 5 (ref 5–15)
BUN: 25 mg/dL — ABNORMAL HIGH (ref 6–20)
CO2: 20 mmol/L — ABNORMAL LOW (ref 22–32)
Calcium: 8.1 mg/dL — ABNORMAL LOW (ref 8.9–10.3)
Chloride: 110 mmol/L (ref 98–111)
Creatinine, Ser: 1.37 mg/dL — ABNORMAL HIGH (ref 0.61–1.24)
GFR, Estimated: >60 mL/min (ref >60)
Glucose, Bld: 158 mg/dL — ABNORMAL HIGH (ref 70–99)
Potassium: 3.6 mmol/L (ref 3.5–5.1)
Sodium: 135 mmol/L (ref 135–145)
Total Bilirubin: 0.9 mg/dL (ref 0.3–1.2)
Total Protein: 5.3 g/dL — ABNORMAL LOW (ref 6.5–8.1)

## 2021-07-27 LAB — BASIC METABOLIC PANEL
Anion gap: 7 (ref 5–15)
BUN: 22 mg/dL — ABNORMAL HIGH (ref 6–20)
CO2: 22 mmol/L (ref 22–32)
Calcium: 7.9 mg/dL — ABNORMAL LOW (ref 8.9–10.3)
Chloride: 110 mmol/L (ref 98–111)
Creatinine, Ser: 1.39 mg/dL — ABNORMAL HIGH (ref 0.61–1.24)
GFR, Estimated: >60 mL/min (ref >60)
Glucose, Bld: 129 mg/dL — ABNORMAL HIGH (ref 70–99)
Potassium: 3.4 mmol/L — ABNORMAL LOW (ref 3.5–5.1)
Sodium: 139 mmol/L (ref 135–145)

## 2021-07-27 LAB — POCT I-STAT 7, (LYTES, BLD GAS, ICA,H+H)
Acid-base deficit: 4 mmol/L — ABNORMAL HIGH (ref 0.0–2.0)
Bicarbonate: 20.8 mmol/L (ref 20.0–28.0)
Calcium, Ion: 1.28 mmol/L (ref 1.15–1.40)
HCT: 22 % — ABNORMAL LOW (ref 39.0–52.0)
Hemoglobin: 7.5 g/dL — ABNORMAL LOW (ref 13.0–17.0)
O2 Saturation: 97 %
Patient temperature: 37.8
Potassium: 4.1 mmol/L (ref 3.5–5.1)
Sodium: 139 mmol/L (ref 135–145)
TCO2: 22 mmol/L (ref 22–32)
pCO2 arterial: 37.7 mmHg (ref 32–48)
pH, Arterial: 7.354 (ref 7.35–7.45)
pO2, Arterial: 103 mmHg (ref 83–108)

## 2021-07-27 LAB — HEMOGLOBIN AND HEMATOCRIT, BLOOD
HCT: 24 % — ABNORMAL LOW (ref 39.0–52.0)
HCT: 28.8 % — ABNORMAL LOW (ref 39.0–52.0)
Hemoglobin: 7.9 g/dL — ABNORMAL LOW (ref 13.0–17.0)
Hemoglobin: 9.5 g/dL — ABNORMAL LOW (ref 13.0–17.0)

## 2021-07-27 LAB — HEPARIN LEVEL (UNFRACTIONATED): Heparin Unfractionated: <0.1 IU/mL — ABNORMAL LOW (ref 0.30–0.70)

## 2021-07-27 LAB — PREPARE RBC (CROSSMATCH)

## 2021-07-27 LAB — LACTATE DEHYDROGENASE: LDH: 941 U/L — ABNORMAL HIGH (ref 98–192)

## 2021-07-27 LAB — MAGNESIUM: Magnesium: 2.3 mg/dL (ref 1.7–2.4)

## 2021-07-27 MED ORDER — SORBITOL 70 % SOLN
30.0000 mL | Freq: Once | Status: AC
  Administered 2021-07-27: 30 mL
  Filled 2021-07-27: qty 30

## 2021-07-27 MED ORDER — FUROSEMIDE 10 MG/ML IJ SOLN
8.0000 mg/h | INTRAVENOUS | Status: DC
  Administered 2021-07-27 – 2021-07-29 (×3): 10 mg/h via INTRAVENOUS
  Filled 2021-07-27 (×3): qty 20

## 2021-07-27 MED ORDER — SODIUM CHLORIDE 0.9% IV SOLUTION: Freq: Once | INTRAVENOUS | Status: AC

## 2021-07-27 MED ORDER — POTASSIUM CHLORIDE 20 MEQ PO PACK
20.0000 meq | PACK | Freq: Once | ORAL | Status: AC
  Administered 2021-07-27: 20 meq
  Filled 2021-07-27: qty 1

## 2021-07-27 MED ORDER — PANTOPRAZOLE 2 MG/ML SUSPENSION
40.0000 mg | Freq: Every day | ORAL | Status: DC
  Administered 2021-07-27 – 2021-07-30 (×4): 40 mg
  Filled 2021-07-27 (×4): qty 20

## 2021-07-27 MED ORDER — FUROSEMIDE 10 MG/ML IJ SOLN
80.0000 mg | Freq: Once | INTRAMUSCULAR | Status: AC
  Administered 2021-07-27: 80 mg via INTRAVENOUS
  Filled 2021-07-27: qty 8

## 2021-07-27 MED ORDER — POLYETHYLENE GLYCOL 3350 17 G PO PACK
17.0000 g | PACK | Freq: Every day | ORAL | Status: DC
  Administered 2021-07-27 – 2021-07-30 (×4): 17 g
  Filled 2021-07-27 (×4): qty 1

## 2021-07-27 MED ORDER — POTASSIUM CHLORIDE 20 MEQ PO PACK
40.0000 meq | PACK | Freq: Once | ORAL | Status: AC
  Administered 2021-07-27: 40 meq
  Filled 2021-07-27: qty 2

## 2021-07-27 MED ORDER — DEXMEDETOMIDINE HCL IN NACL 400 MCG/100ML IV SOLN
0.4000 ug/kg/h | INTRAVENOUS | Status: DC
  Administered 2021-07-27: 0.4 ug/kg/h via INTRAVENOUS
  Administered 2021-07-28 (×2): 0.7 ug/kg/h via INTRAVENOUS
  Administered 2021-07-28: 0.2 ug/kg/h via INTRAVENOUS
  Administered 2021-07-29: 0.7 ug/kg/h via INTRAVENOUS
  Filled 2021-07-27 (×7): qty 100

## 2021-07-27 NOTE — Progress Notes (Addendum)
Brief Cortrak Note ?  ?Person Inserting Tube:  Sumeet Geter, Verdon Cummins, RD ?Tube Type:  Cortrak - 43 inches ?Tube Size:  10 ?Tube Location:  Left nare ?Secured by: Garrison Callas ?Technique Used to Measure Tube Placement:  Marking at nare/corner of mouth ?Cortrak Advanced to:  100 cm ?  ?Cortrak Tube Team Note: ?  ?Consult received to advance Cortrak feeding tube.  ?  ?X-ray is required, abdominal x-ray has been ordered by the Cortrak team. Please confirm tube placement before using the Cortrak tube.  ?  ?If the tube becomes dislodged please keep the tube and contact the Cortrak team at [email or web address] (password [email or web address]) for replacement.  ?If after hours and replacement cannot be delayed, place a NG tube and confirm placement with an abdominal x-ray.  ?  ?  ?  ?Rae Lips., MS, RD, LDN (she/her/hers) ?RD pager number and weekend/on-call pager number located in Amion. ?  ?

## 2021-07-27 NOTE — Progress Notes (Signed)
2 Days Post-Op Procedure(s) (LRB): ?PLACEMENT OF IMPELLA 5.5 LEFT VENTRICULAR ASSIST DEVICE (N/A) ?TRANSESOPHAGEAL ECHOCARDIOGRAM (TEE) (N/A) ?Subjective: ?Improved hemodynamics, norepi down to 8 mcg ?Vad flow 5L at  P8 ?PAD increasing- lasix drip ordered ?Opens eyes on less sedation ?Hb 7.7 with lower coox- will transfuse ?CXR stable, FiO2 at .40 ?Objective: ?Vital signs in last 24 hours: ?Temp:  [98.6 ?F (37 ?C)-101.8 ?F (38.8 ?C)] 100 ?F (37.8 ?C) (04/05 0900) ?Pulse Rate:  [39-117] 96 (04/05 0900) ?Cardiac Rhythm: Normal sinus rhythm (04/05 0400) ?Resp:  [9-22] 14 (04/05 0900) ?BP: (80-89)/(75-84) 80/75 (04/05 0900) ?SpO2:  [73 %-100 %] 100 % (04/05 0845) ?Arterial Line BP: (75-105)/(69-94) 80/76 (04/05 0900) ?FiO2 (%):  [40 %] 40 % (04/05 0845) ?Weight:  [87 kg] 87 kg (04/05 0422) ? ?Hemodynamic parameters for last 24 hours: ?PAP: (27-47)/(13-27) 41/24 ?CVP:  [8 mmHg-14 mmHg] 10 mmHg ?PCWP:  [26 mmHg] 26 mmHg ?CO:  [4.2 L/min-4.9 L/min] 4.9 L/min ?CI:  [2.1 L/min/m2-2.5 L/min/m2] 2.5 L/min/m2 ? ?Intake/Output from previous day: ?04/04 0701 - 04/05 0700 ?In: 4765.4 [I.V.:2528.6; NG/GT:1020; IV Piggyback:930.5] ?Out: 2180 [Urine:2165; Drains:15] ?Intake/Output this shift: ?Total I/O ?In: 336.5 [I.V.:194; Blood:30; Other:12.5; NG/GT:100] ?Out: 145 [Urine:145] ? ?EXAM ?Lungs clear ?Impella secure, dressing w/o drainage ?Extrem warm  ?+ bowel sounds ?Lab Results: ?Recent Labs  ?  07/26/21 ?[id number] 07/27/21 ?0407 07/27/21 ?[id number] 07/27/21 ?[id number]  ?WBC 15.0* 8.8  --   --   ?HGB 10.0* 7.7* 7.5* 7.9*  ?HCT 29.8* 24.5* 22.0* 24.0*  ?PLT 192 128*  --   --   ? ?BMET:  ?Recent Labs  ?  07/26/21 ?1700 07/27/21 ?0407 07/27/21 ?0453  ?NA 137 135 139  ?K 3.8 3.6 4.1  ?CL 112* 110  --   ?CO2 20* 20*  --   ?GLUCOSE 181* 158*  --   ?BUN 25* 25*  --   ?CREATININE 1.44* 1.37*  --   ?CALCIUM 8.2* 8.1*  --   ?  ?PT/INR:  ?Recent Labs  ?  08/14/2021 ?1103  ?LABPROT 16.4*  ?INR 1.3*  ? ?ABG ?   ?Component Value Date/Time  ? PHART 7.354  07/27/2021 0453  ? HCO3 20.8 07/27/2021 0453  ? TCO2 22 07/27/2021 0453  ? ACIDBASEDEF 4.0 (H) 07/27/2021 0453  ? O2SAT 97 07/27/2021 0453  ? ?CBG (last 3)  ?Recent Labs  ?  07/27/21 ?0702 07/27/21 ?0746 07/27/21 ?0909  ?GLUCAP 227* 182* 176*  ? ? ?Assessment/Plan: ?S/P Procedure(s) (LRB): ?PLACEMENT OF IMPELLA 5.5 LEFT VENTRICULAR ASSIST DEVICE (N/A) ?TRANSESOPHAGEAL ECHOCARDIOGRAM (TEE) (N/A) ?Diuresis ?Diabetes control ?Continue foley due to diuresing patient and urinary output monitoring ?Careful titration of peripheral heparin ?1 unit PRBCs today ? LOS: 4 days  ? ? ?Dahlia Byes ?07/27/2021 ?  ?

## 2021-07-27 NOTE — Progress Notes (Addendum)
Patient ID: [name] [name], male   DOB: June 23, 1970, 51 y.o.   MRN: [id number] ?  ? ? Advanced Heart Failure Rounding Note ? ?PCP-Cardiologist: None  ? ?Subjective:   ?4/2 LHC : Occluded ostial LAD (culprit), occluded ramus with collaterals, 90% distal LCx prior to left PDA.  PCI to ostial LAD with DES.  ? ?4/3 IABP removed. S/P Impella 5.5. Diuresed with IV lasix. NSVT with amio bolus.  ?4/4 Diuresed with IV lasix.  Remains + . Weight trending up. Milrinone weaned off. Impella increased to P8.  ? ?Remains on Dobutamine 2.5 mcg, Norepi  8 mcg, and amio drip.  ? ?Impella 5.5 P8 Flow 5  ? ?Overnight T Max 100. WBC 8.8 . On vanc, micafungin, and aztreonam. Bld CX- NGTD.  ? ?Sedated on vent.  ? ?CO-OX 54%.  ?Hgb 7.7 repeat now ?LDH [phone]  ?Creatinine 1.7>1.4 >1.37   ? ?Swan numbers: ?PAP: (27-47)/(13-32) 41/24 ?CVP:  [8 mmHg-15 mmHg] 10 mmHg ?PCWP:  [26 mmHg] 26 mmHg ?CO:  [4.2 L/min-4.9 L/min] 4.9 L/min ?CI:  [2.1 L/min/m2-2.5 L/min/m2] 2.5 L/min/m2 ?SVR 1149 ? ? ?Objective:   ?Weight Range: ?87 kg ?Body mass index is 28.32 kg/m?.  ? ?Vital Signs:   ?Temp:  [99.9 ?F (37.7 ?C)-101.8 ?F (38.8 ?C)] 100 ?F (37.8 ?C) (04/05 0700) ?Pulse Rate:  [39-118] 97 (04/05 0530) ?Resp:  [9-22] 14 (04/05 0700) ?BP: (82-89)/(75-84) 89/84 (04/05 0400) ?SpO2:  [73 %-100 %] 99 % (04/05 0530) ?Arterial Line BP: (75-107)/(67-94) 87/77 (04/05 0700) ?FiO2 (%):  [40 %] 40 % (04/05 0400) ?Weight:  [87 kg] 87 kg (04/05 0422) ?Last BM Date : 08/17/2021 ? ?Weight change: ?Filed Weights  ? 08/12/2021 2021 07/26/21 0500 07/27/21 0422  ?Weight: 81.6 kg 84.7 kg 87 kg  ? ? ?Intake/Output:  ? ?Intake/Output Summary (Last 24 hours) at 07/27/2021 0720 ?Last data filed at 07/27/2021 0700 ?Gross per 24 hour  ?Intake 4765.39 ml  ?Output 2180 ml  ?Net 2585.39 ml  ?  ? ? ?Physical Exam  ? General:  Intubated ?HEENT: ETT/ Sedated  ?Neck: supple. JVP 8-9. Carotids 2+ bilat; no bruits. No lymphadenopathy or thryomegaly appreciated. RIJ ?Cor: PMI nondisplaced. Regular  rate & rhythm. No rubs, gallops or murmurs. R axillary impella. JP drain ?Lungs: Coarse throughout.  ?Abdomen: soft, nontender, distended. No hepatosplenomegaly. No bruits or masses. Good bowel sounds. ?Extremities: no cyanosis, clubbing, rash, edema. R great toe tip black  ?Neuro: Intubated ?GU: foley  ? ? ?Telemetry  ?SR 90s with occasional PVCs. Personally checked  ? ?Labs  ?  ?CBC ?Recent Labs  ?  08/08/2021 ?0438 07/28/2021 ?1103 08/11/2021 ?1737 07/26/2021 ?1946 07/26/21 ?4081 07/27/21 ?0407 07/27/21 ?4481  ?WBC 19.5*   < > 14.6*  --  15.0* 8.8  --   ?NEUTROABS 15.1*  --  11.4*  --   --   --   --   ?HGB 12.1*   < > 11.1*   < > 10.0* 7.7* 7.5*  ?HCT 35.5*   < > 33.4*   < > 29.8* 24.5* 22.0*  ?MCV 84.5   < > 84.6  --  84.9 88.8  --   ?PLT 257   < > 189  --  192 128*  --   ? < > = values in this interval not displayed.  ? ?Basic Metabolic Panel ?Recent Labs  ?  07/26/21 ?0419 07/26/21 ?1700 07/27/21 ?0407 07/27/21 ?0453  ?NA 138 137 135 139  ?K 3.8 3.8 3.6 4.1  ?CL 110 112*  110  --   ?CO2 21* 20* 20*  --   ?GLUCOSE 156* 181* 158*  --   ?BUN 24* 25* 25*  --   ?CREATININE 1.40* 1.44* 1.37*  --   ?CALCIUM 8.3* 8.2* 8.1*  --   ?MG 1.9 2.2  --   --   ?PHOS 3.7 3.1  --   --   ? ?Liver Function Tests ?Recent Labs  ?  07/26/21 ?0419 07/27/21 ?0407  ?AST 194* 109*  ?ALT 69* 63*  ?ALKPHOS 64 91  ?BILITOT 0.7 0.9  ?PROT 5.3* 5.3*  ?ALBUMIN 1.8* 2.1*  ? ?No results for input(s): LIPASE, AMYLASE in the last 72 hours. ?Cardiac Enzymes ?No results for input(s): CKTOTAL, CKMB, CKMBINDEX, TROPONINI in the last 72 hours. ? ?BNP: ?BNP (last 3 results) ?Recent Labs  ?  08/10/2021 ?0017  ?BNP 431.0*  ? ? ?ProBNP (last 3 results) ?No results for input(s): PROBNP in the last 8760 hours. ? ? ?D-Dimer ?No results for input(s): DDIMER in the last 72 hours. ? ?Hemoglobin A1C ?No results for input(s): HGBA1C in the last 72 hours. ? ?Fasting Lipid Panel ?No results for input(s): CHOL, HDL, LDLCALC, TRIG, CHOLHDL, LDLDIRECT in the last 72  hours. ? ?Thyroid Function Tests ?No results for input(s): TSH, T4TOTAL, T3FREE, THYROIDAB in the last 72 hours. ? ?Invalid input(s): FREET3 ? ?Other results: ? ? ?Imaging  ? ? ?DG CHEST PORT 1 VIEW ? ?Result Date: 07/26/2021 ?CLINICAL DATA:  pneumonia EXAM: PORTABLE CHEST 1 VIEW COMPARISON:  Chest x-ray dated July 25, 2021 FINDINGS: Cardiac and mediastinal contours are unchanged. ETT is unchanged in position. Stable position of Impella device. Stable position of left arm PICC. Interval retraction of PA catheter with tip overlying the expected area of the right pulmonary artery. Interval removal of inferior approach PA catheter. Interval placement of enteric tube which is partially visualized coursing below the diaphragm. No focal consolidation. No large pleural effusion or pneumothorax. IMPRESSION: Interval retraction of right IJ approach PA catheter with tip overlying the expected area of the right pulmonary artery. Interval removal of inferior approach PA catheter. Electronically Signed   By: Yetta Glassman M.D.   On: 07/26/2021 08:09  ? ?ECHOCARDIOGRAM LIMITED ? ?Result Date: 07/26/2021 ?   ECHOCARDIOGRAM LIMITED REPORT   Patient Name:   [name] [name] Date of Exam: 07/26/2021 Medical Rec #:  [id number]      Height:       69.0 in Accession #:    [id number]     Weight:       186.7 lb Date of Birth:  [birth year]       BSA:          2.007 m? Patient Age:    77 years       BP:           89/28 mmHg Patient Gender: M              HR:           121 bpm. Exam Location:  Inpatient Procedure: Limited Echo, Color Doppler and 3D Echo Indications:    I51.9 Decreased LV function. Impella study  History:        Patient has prior history of Echocardiogram examinations, most                 recent 08/12/2021. CHF and LV DysFx, . Shock.  Sonographer:    Roseanna Rainbow RDCS Referring Phys: [place]  1. Left ventricular ejection fraction,  by estimation, is <20%. The left ventricle has severely decreased function. The left  ventricle demonstrates global hypokinesis. There is mild concentric left ventricular hypertrophy.  2. Right ventricular systolic function is normal. The right ventricular size is normal.  3. The pericardial effusion is circumferential. FINDINGS  Left Ventricle: Impella device is well positioned, roughly 4.8 cm across the aortic valve, but the pigtail extension appears "trapped" under the papillary muscle. Left ventricular ejection fraction, by estimation, is <20%. The left ventricle has severely decreased function. The left ventricle demonstrates global hypokinesis. The left ventricular internal cavity size was normal in size. There is mild concentric left ventricular hypertrophy. Right Ventricle: The right ventricular size is normal. Right ventricular systolic function is normal. Pericardium: Trivial pericardial effusion is present. The pericardial effusion is circumferential. Additional Comments: A venous catheter is visualized in the right ventricle. Sanda Klein MD Electronically signed by Sanda Klein MD Signature Date/Time: 07/26/2021/1:26:21 PM    Final    ? ? ?Medications:   ? ? ?Scheduled Medications: ? aspirin  81 mg Per NG tube Daily  ? atorvastatin  80 mg Per NG tube Daily  ? buPROPion  75 mg Per Tube BID  ? chlorhexidine gluconate (MEDLINE KIT)  15 mL Mouth Rinse BID  ? Chlorhexidine Gluconate Cloth  6 each Topical Daily  ? docusate  100 mg Per Tube BID  ? feeding supplement (PROSource TF)  45 mL Per Tube QID  ? folic acid  1 mg Per Tube Daily  ? mouth rinse  15 mL Mouth Rinse BID  ? mouth rinse  15 mL Mouth Rinse 10 times per day  ? multivitamin with minerals  1 tablet Per Tube Daily  ? mupirocin ointment  1 application. Nasal BID  ? pantoprazole (PROTONIX) IV  40 mg Intravenous Q24H  ? QUEtiapine  25 mg Per Tube BID  ? sodium chloride flush  10-40 mL Intracatheter Q12H  ? sodium chloride flush  3 mL Intravenous Q12H  ? ticagrelor  90 mg Per Tube BID  ? ? ?Infusions: ? sodium chloride    ? sodium  chloride 10 mL/hr at 07/27/21 0700  ? sodium chloride    ? amiodarone 30 mg/hr (07/27/21 0700)  ? aztreonam Stopped (07/27/21 0053)  ? dexmedetomidine (PRECEDEX) IV infusion 0.9 mcg/kg/hr (07/27/21 0700)  ? DOBUTamine

## 2021-07-27 NOTE — Progress Notes (Signed)
Inpatient Diabetes Program Recommendations ? ?AACE/ADA: New Consensus Statement on Inpatient Glycemic Control (2015) ? ?Target Ranges:  Prepandial:   less than 140 mg/dL ?     Peak postprandial:   less than 180 mg/dL (1-2 hours) ?     Critically ill patients:  140 - 180 mg/dL  ? ?Lab Results  ?Component Value Date  ? GLUCAP 176 (H) 07/27/2021  ? HGBA1C 11.8 (H) 08/10/2021  ? ? ?Review of Glycemic Control ? ?Inpatient Diabetes Program Recommendations:   ?Patient has high insulin needs ranging from 9 units/hr to 20 units/hr. ?Recommend remaining on insulin drip until insulin needs stabilize. ? ?Thank you, ?[name] [name] Alijah Akram, RN, MSN, CDE  ?Diabetes Coordinator ?Inpatient Glycemic Control Team ?Team Pager [phone] (8am-5pm) ?07/27/2021 9:41 AM ? ? ? ? ?

## 2021-07-27 NOTE — Procedures (Addendum)
Cortrak ? ?Person Inserting Tube:  Amaro Mangold, Verdon Cummins, RD ?Tube Type:  Cortrak - 43 inches ?Tube Size:  10 ?Tube Location:  Left nare ?Secured by: [place] Callas ?Technique Used to Measure Tube Placement:  Marking at nare/corner of mouth ?Cortrak Secured At:  73 cm ? ?Cortrak Tube Team Note: ? ?Consult received to place a Cortrak feeding tube.  ? ?X-ray is required, abdominal x-ray has been ordered by the Cortrak team. Please confirm tube placement before using the Cortrak tube.  ? ?If the tube becomes dislodged please keep the tube and contact the Cortrak team at [email or web address] (password [email or web address]) for replacement.  ?If after hours and replacement cannot be delayed, place a NG tube and confirm placement with an abdominal x-ray.  ? ? ? ?Rae Lips., MS, RD, LDN (she/her/hers) ?RD pager number and weekend/on-call pager number located in Amion. ? ? ?

## 2021-07-27 NOTE — Procedures (Signed)
Arterial Catheter Insertion Procedure Note ? ?Orville Govern  ?[id number]  ?07-15-1970 ? ?Date:07/27/21  ?Time:1:14 PM  ? ? ?Provider Performing: Memory Argue  ? ? ?Procedure: Insertion of Arterial Line (54627) without US guidance ? ?Indication(s) ?Blood pressure monitoring and/or need for frequent ABGs ? ?Consent ?Risks of the procedure as well as the alternatives and risks of each were explained to the patient and/or caregiver.  Consent for the procedure was obtained and is signed in the bedside chart ? ?Anesthesia ?None ? ? ?Time Out ?Verified patient identification, verified procedure, site/side was marked, verified correct patient position, special equipment/implants available, medications/allergies/relevant history reviewed, required imaging and test results available. ? ? ?Sterile Technique ?Maximal sterile technique including full sterile barrier drape, hand hygiene, sterile gown, sterile gloves, mask, hair covering, sterile ultrasound probe cover (if used). ? ? ?Procedure Description ?Area of catheter insertion was cleaned with chlorhexidine and draped in sterile fashion. Without real-time ultrasound guidance an arterial catheter was placed into the right radial artery.  Appropriate arterial tracings confirmed on monitor.   ? ? ?Complications/Tolerance ?None; patient tolerated the procedure well. ? ? ?EBL ?Minimal ? ? ?Specimen(s) ?None ? ?

## 2021-07-27 NOTE — Progress Notes (Addendum)
Reported drop in hgb from 10>7.7 to [place]. Impella site stable- no signs of bleeding. Urine yellow/clear.  ? ?Patient was started on hep gtt yesterday for his impella.  ? ?Cards paged for morning potassium of 3.6. MD ordered 32mEq replacement per OGT.  ?

## 2021-07-27 NOTE — Progress Notes (Signed)
? ?  PM round.  ? ?Currently on Impella 5.5 at P8 Flow 5. On norepi 6 mcg,and dobutamine 2.5 mcg.  ? ?Improved urine output with lasix drip. Continue reassess in am.  ? ? ?PAP: (30-47)/(14-27) 41/24 ?CVP:  [9 mmHg-13 mmHg] 12 mmHg ?PCWP:  [26 mmHg] 26 mmHg ?CO:  [4.5 L/min-4.9 L/min] 4.7 L/min ?CI:  [2.3 L/min/m2-2.5 L/min/m2] 2.4 L/min/m2 ? ?[name] Giambra NP-C  ?5:28 PM ? ? ?

## 2021-07-27 NOTE — Progress Notes (Signed)
? ?NAME:  [name] [name], MRN:  [id number], DOB:  1971/02/15, LOS: 4 ?ADMISSION DATE:  07/28/21, CONSULTATION DATE: 08/11/2021 ?REFERRING MD: Dr. Laren Everts, CHIEF COMPLAINT: Chest pain ? ?History of Present Illness:  ?This is a 51 year old white male who presented to the emergency room with new onset chest pain.  Patient states that he had substernal chest pain/pressure radiating to the left side of his chest.  He denies radiculopathy into the arm or into the jaw.  Prior to coming to the hospital he was short of breath and diaphoretic with chest pain.  He denies any nausea or vomiting.  Patient being seen at [facility] emergency room 2 days ago for a diabetic foot ulcer on the right foot.  He was sent home at that time on doxycycline.  His blood sugars generally out-of-control with a last hemoglobin A1c of 11.  Currently the patient states that he is a 1 out of 10 chest pressure.  Patient has a history of myocardial infarction with angioplasty in 2021.  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 ? ?Pertinent  Medical History  ?Uncontrolled diabetes mellitus type 2 ?Right great toe foot ulcer ?Myocardial infarction ?CVA ?Hypertension ? ?Significant Hospital Events: ?Including procedures, antibiotic start and stop dates in addition to other pertinent events   ?4/2 admitted with chest pain.  Suspect missed STEMI ?4/2 cardiac catheterization shows triple-vessel disease with total occlusion of an ostial LAD lesion with successful stent placement.  Continued occlusion of the distal LAD.  Elevated LVEDP at 24 mmHg ?4/2 insertion of intra-aortic balloon pump ?4/2 Foley catheter placed by urology.  Patient has prior stricture ?4/2 echocardiogram shows EF less than 20% with severely decreased LV function with global hypokinesis.  Inferolateral segment moves best. ?4/2 intubated for respiratory distress. ?4/3 Placement of Impella 5.5 left ventricular assist device.Removal  of intraaortic balloon pump from the left femoral artery. Placement of pulmonary artery Swan-Ganz catheter via right IJ. ? ? ?Subjective/overnight events:  ?Significantly improved vasopressor requirements since yesterday. ? ?Objective   ?Blood pressure (!) 80/75, pulse 96, temperature 100 ?F (37.8 ?C), temperature source Core, resp. rate 14, height 5\' 9"  (1.753 m), weight 87 kg, SpO2 100 %. ?PAP: (27-47)/(13-27) 41/24 ?CVP:  [8 mmHg-14 mmHg] 10 mmHg ?PCWP:  [26 mmHg] 26 mmHg ?CO:  [4.2 L/min-4.9 L/min] 4.9 L/min ?CI:  [2.1 L/min/m2-2.5 L/min/m2] 2.5 L/min/m2  ?Vent Mode: PRVC ?FiO2 (%):  [40 %] 40 % ?Set Rate:  [14 bmp] 14 bmp ?Vt Set:  [560 mL] 560 mL ?PEEP:  [5 cmH20] 5 cmH20 ?Plateau Pressure:  [18 cmH20-20 cmH20] 18 cmH20  ? ?Intake/Output Summary (Last 24 hours) at 07/27/2021 0934 ?Last data filed at 07/27/2021 0900 ?Gross per 24 hour  ?Intake 4787.76 ml  ?Output 2225 ml  ?Net 2562.76 ml  ? ? ?Filed Weights  ? 07-28-2021 2021 07/26/21 0500 07/27/21 0422  ?Weight: 81.6 kg 84.7 kg 87 kg  ? ? ?Examination: ?General: Appears older than stated age.  Intubated on fentanyl propofol for sedation.  Color has improved ?HENT: ET tube OG tube in place. ?Lungs: Clear to auscultation bilaterally.  No wheezing rales or rhonchi noted. ?Cardiovascular: Tachycardic, no rub murmur gallop appreciated.  Impella in right subclavian.  No further bleeding from insertion site ?Abdomen: Soft, nontender, nondistended, no rebound/rigidity/guarding.  Positive bowel sounds. ?Extremities: No edema of the lower extremities.  Distal pulses are weakly present x4 extremities.  Temperature is uniform across all extremities proximal and  distal.  The right great toe on the dorsal surface has a 1.5 x 1.5 area of eschar.  No significant erythema.  There is no change in pallor.  Prior groin sites are intact.  ?Neuro: Currently sedated, not following commands ?Skin: No rash ? ?POC echo shows Impella in suboptimal position abutting the MV  cordae. ? ?Assessment & Plan:  ?Critically ill due to acute cardiogenic shock requiring titration of inotropes and vasopressors and management of mechanical cardiac support due to acute coronary syndrome with STEMI ?HFrEF EF estimated 10 to 15% ?Critically ill due to acute hypoxic respiratory failure requiring mechanical ventilation ?Peripheral vascular disease ?Right great toe ulceration with eschar in place ?Uncontrolled diabetes mellitus ?Hypertension ?Wide-complex tachycardia ? ?Plan: ?-Improving hemodynamics.  Currently on low-dose dobutamine and low-dose norepinephrine, latter may be largely counteracting effects of sedation. ?-Aggressive attempt to diuresis today ?-Wean sedation to allow for SBT. ?-Goal would be to attempt extubation in 48 hours ?- Continue antibiotics, may still have a distributive component from diabetic foot infection.  Added micafungin and [place].  Fevers persist but hemodynamics are improving ?-Goal would eventually be to transition patient to guideline directed heart failure therapy as we await family to arrive from [place].  Patient does not have any good long-term salvage options apart from optimize medical therapy. ? ?Best Practice (right click and "Reselect all SmartList Selections" daily)  ? ?Diet/type: On tube feeds.  We will place core track today ?DVT prophylaxis: systemic heparin ?GI prophylaxis: PPI ?Lines: N/A ?Foley:  N/A ?Code Status:  full code ?Last date of multidisciplinary goals of care discussion [family updated at bedside.  They are aware of poor prognosis and patient's prior health difficulties.] ? ?CRITICAL CARE ?Performed by: Lynnell Catalan ? ? ?Total critical care time: 40 minutes ? ?Critical care time was exclusive of separately billable procedures and treating other patients. ? ?Critical care was necessary to treat or prevent imminent or life-threatening deterioration. ? ?Critical care was time spent personally by me on the following activities: development of  treatment plan with patient and/or surrogate as well as nursing, discussions with consultants, evaluation of patient's response to treatment, examination of patient, obtaining history from patient or surrogate, ordering and performing treatments and interventions, ordering and review of laboratory studies, ordering and review of radiographic studies, pulse oximetry, re-evaluation of patient's condition and participation in multidisciplinary rounds. ? ?Lynnell Catalan, MD FRCPC ?ICU Physician ?[facility]  ?Pager: [phone] ?Mobile: [phone] ?After hours: [phone]. ? ? ? ? ?  ?

## 2021-07-27 NOTE — Progress Notes (Signed)
ANTICOAGULATION CONSULT NOTE  ? ?Pharmacy Consult for heparin ?Indication: Impella ? ?Allergies  ?Allergen Reactions  ? Penicillins Other (See Comments) and Anaphylaxis  ?  Has patient had a PCN reaction causing immediate rash, facial/tongue/throat swelling, SOB or lightheadedness with hypotension: Yes ?Has patient had a PCN reaction causing severe rash involving mucus membranes or skin necrosis: No ?Has patient had a PCN reaction that required hospitalization: No ?Has patient had a PCN reaction occurring within the last 10 years: No ?If all of the above answers are "NO", then may proceed with Cephalosporin use. ?  ? Empagliflozin Other (See Comments)  ?  Urinary tract infectious disease  ? Liraglutide Nausea And Vomiting  ? ? ?Patient Measurements: ?Height: 5\' 9"  (175.3 cm) ?Weight: 87 kg (191 lb 12.8 oz) ?IBW/kg (Calculated) : 70.7 ?Heparin Dosing Weight: TBW ? ?Vital Signs: ?Temp: 100 ?F (37.8 ?C) (04/05 0900) ?Temp Source: Core (04/05 1200) ?BP: 80/75 (04/05 0900) ?Pulse Rate: 110 (04/05 1102) ? ?Labs: ?Recent Labs  ?  08/02/2021 ?1103 07/28/2021 ?1148 08/03/2021 ?1737 08/06/2021 ?1946 07/26/21 ?09/25/21 07/26/21 ?09/25/21 07/26/21 ?1700 07/26/21 ?1715 07/27/21 ?0407 07/27/21 ?09/26/21 07/27/21 ?09/26/21  ?HGB 11.8*   < > 11.1*   < > 10.0*  --   --   --  7.7* 7.5* 7.9*  ?HCT 33.5*   < > 33.4*   < > 29.8*  --   --   --  24.5* 22.0* 24.0*  ?PLT 208  --  189  --  192  --   --   --  128*  --   --   ?APTT 49*  --   --   --   --   --   --   --   --   --   --   ?LABPROT 16.4*  --   --   --   --   --   --   --   --   --   --   ?INR 1.3*  --   --   --   --   --   --   --   --   --   --   ?HEPARINUNFRC 0.12*  --   --   --   --  <0.10*  --  <0.10* <0.10*  --   --   ?CREATININE 1.65*   < > 1.52*  1.47*  --  1.40*  --  1.44*  --  1.37*  --   --   ? < > = values in this interval not displayed.  ? ? ? ?Estimated Creatinine Clearance: 71.2 mL/min (A) (by C-G formula based on SCr of 1.37 mg/dL (H)). ? ? ?Assessment: ?59 yoM admitted with ACS and  cardiogenic shock. Pt s/p LHC with DES and IABP placement 4/2. IABP upgraded to Impella 5.5 4/3. ? ?Heparin infusing through purge at ~12.5 ml/h (625 units/h) and systemic heparin drip 300 uts/hr.  ?Total heparin infusion 925 uts/hr  ?Heparin level < 0.1 undetectable. ?Purge pressure stable 400-500  ?Hgb fell 10>8 this am replace prbc  - no overt bleeding noted  ?No changes to systemic heparin today  - follow up in am  ?Patient on 1000 uts/hr prior to procedure and heparin level was only 0.12   ?   ?LDH trending down, CBC stable, JP drain and chest tube output stable. Slight oozing at impella insertion site resolved. Transitioned cangrelor infusion to ticagrelor.  ? ? ?Goal of Therapy:  ?Heparin level 0.2-0.5 units/ml  ?Monitor platelets  by anticoagulation protocol: Yes ?  ?Plan:  ?Continue heparinized purge ?Continue systemic heparin 300 units/h ?Daily heparin level and CBC  ? ? ?Leota Sauers Pharm.D. CPP, BCPS ?Clinical Pharmacist ?[phone] ?07/27/2021 3:34 PM  ? ?Please check AMION for all [facility] numbers ?07/27/2021 ? ? ? ?

## 2021-07-27 NOTE — Progress Notes (Signed)
Nutrition Follow-up ? ?DOCUMENTATION CODES:  ? ?Not applicable ? ?INTERVENTION:  ? ?Tube Feeding via Cortrak (post-pyloric):  ?Vital 1.5 at 60 ml/hr ?Pro-Source TF 45 mL QID ?This provides 141 g of protein, 2320 kcals and 1094 mL of free water ? ?If continues without BM, recommend adjusting bowel regimen ? ?NUTRITION DIAGNOSIS:  ? ?Inadequate oral intake related to acute illness as evidenced by NPO status. ? ?Being addressed via TF  ? ?GOAL:  ? ?Patient will meet greater than or equal to 90% of their needs ? ?Progressing ? ?MONITOR:  ? ?Vent status, Labs, Weight trends, TF tolerance ? ?REASON FOR ASSESSMENT:  ? ?Ventilator ?  ? ?ASSESSMENT:  ? ?51 yo male admitted with acute coronary syndrome with NSTEMI, CHF with EF 10-15%, shock-cardiogenic with possible sepsis as well. Pt has diabetic foot ulcer. PMH includes DM-poorly controlled (HgbA1c 11.8), HTN, stroke. Pt is wheelchair bound from residual right-sided weakness/deficits from previous stroke ? ?4/02 Admitted to ICU, Intubated for Heart Cath, PCI to LAD, IABP ?4/03 Impella 5.5 LVAD placed, IABP removed ? ?Pt remains on vent support, requiring dobutamine, levophed ?Pt with low grade fever ? ?Vital 1.5 at 50 ml/hr and pt tolerating thus far via OG; goal rate of 60 ml/hr ? ?Cortrak placed today, post pyloric position obtained ? ?Weight up to 87 kg; started on lasix drip ? ?No BM since admission; on miralax and colace. May need to adjust bowel regimen ? ?Labs: reviewed ?Meds: lasix gtt, insulin gtt, folic acid, miralax, MVI with Minerals, folic acid, thiamine ? ?Diet Order:   ?Diet Order   ? ?       ?  Diet NPO time specified  Diet effective now       ?  ? ?  ?  ? ?  ? ? ?EDUCATION NEEDS:  ? ?Not appropriate for education at this time ? ?Skin:  Skin Assessment: Skin Integrity Issues: ?Skin Integrity Issues:: Diabetic Ulcer ?Diabetic Ulcer: R. Great Toe, R. Foot (on top) ? ?Last BM:  4/1 ? ?Height:  ? ?Ht Readings from Last 1 Encounters:  ?08/03/2021 5\' 9"  (1.753 m)   ? ? ?Weight:  ? ?Wt Readings from Last 1 Encounters:  ?07/27/21 87 kg  ? ? ? ?BMI:  Body mass index is 28.32 kg/m?. ? ?Estimated Nutritional Needs:  ? ?Kcal:  09/26/21 kcals ? ?Protein:  125-150 g ? ?Fluid:  2L ? ?[id number] MS, RDN, LDN, CNSC ?Registered Dietitian III ?Clinical Nutrition ?RD Pager and On-Call Pager Number Located in [place]  ? ?

## 2021-07-27 NOTE — Progress Notes (Signed)
eLink Physician-Brief Progress Note ?Patient Name: [name] [name] ?DOB: 11/02/1970 ?MRN: [id number] ? ? ?Date of Service ? 07/27/2021  ?HPI/Events of Note ? Hgb = 7.7 this AM which is down from 10. Nursing denies visible active bleeding. Bleeding from Impella site yesterday.   ?eICU Interventions ? Plan: ?Repeat H/H at 11 AM. If Hgb continues to drop, me need CT Scan of Abdomen and Pelvis to look for retroperitoneal source of blood loss.   ? ? ? ?Intervention Category ?Major Interventions: Other: ? ?Sebron Mcmahill Cornelia Copa ?07/27/2021, 4:59 AM ?

## 2021-07-28 ENCOUNTER — Inpatient Hospital Stay (HOSPITAL_COMMUNITY): Payer: No Typology Code available for payment source

## 2021-07-28 DIAGNOSIS — I5023 Acute on chronic systolic (congestive) heart failure: Secondary | ICD-10-CM | POA: Diagnosis not present

## 2021-07-28 DIAGNOSIS — I249 Acute ischemic heart disease, unspecified: Secondary | ICD-10-CM | POA: Diagnosis not present

## 2021-07-28 DIAGNOSIS — R570 Cardiogenic shock: Secondary | ICD-10-CM | POA: Diagnosis not present

## 2021-07-28 LAB — POCT I-STAT 7, (LYTES, BLD GAS, ICA,H+H)
Acid-Base Excess: 0 mmol/L (ref 0.0–2.0)
Bicarbonate: 24.3 mmol/L (ref 20.0–28.0)
Calcium, Ion: 1.18 mmol/L (ref 1.15–1.40)
HCT: 26 % — ABNORMAL LOW (ref 39.0–52.0)
Hemoglobin: 8.8 g/dL — ABNORMAL LOW (ref 13.0–17.0)
O2 Saturation: 95 %
Patient temperature: 38
Potassium: 3.1 mmol/L — ABNORMAL LOW (ref 3.5–5.1)
Sodium: 140 mmol/L (ref 135–145)
TCO2: 25 mmol/L (ref 22–32)
pCO2 arterial: 40.6 mmHg (ref 32–48)
pH, Arterial: 7.389 (ref 7.35–7.45)
pO2, Arterial: 82 mmHg — ABNORMAL LOW (ref 83–108)

## 2021-07-28 LAB — COMPREHENSIVE METABOLIC PANEL
ALT: 67 U/L — ABNORMAL HIGH (ref 0–44)
AST: 87 U/L — ABNORMAL HIGH (ref 15–41)
Albumin: 1.9 g/dL — ABNORMAL LOW (ref 3.5–5.0)
Alkaline Phosphatase: 110 U/L (ref 38–126)
Anion gap: 8 (ref 5–15)
BUN: 21 mg/dL — ABNORMAL HIGH (ref 6–20)
CO2: 24 mmol/L (ref 22–32)
Calcium: 7.6 mg/dL — ABNORMAL LOW (ref 8.9–10.3)
Chloride: 106 mmol/L (ref 98–111)
Creatinine, Ser: 1.38 mg/dL — ABNORMAL HIGH (ref 0.61–1.24)
GFR, Estimated: >60 mL/min (ref >60)
Glucose, Bld: 197 mg/dL — ABNORMAL HIGH (ref 70–99)
Potassium: 3 mmol/L — ABNORMAL LOW (ref 3.5–5.1)
Sodium: 138 mmol/L (ref 135–145)
Total Bilirubin: 1 mg/dL (ref 0.3–1.2)
Total Protein: 5.6 g/dL — ABNORMAL LOW (ref 6.5–8.1)

## 2021-07-28 LAB — GLUCOSE, CAPILLARY
Glucose-Capillary: 121 mg/dL — ABNORMAL HIGH (ref 70–99)
Glucose-Capillary: 134 mg/dL — ABNORMAL HIGH (ref 70–99)
Glucose-Capillary: 147 mg/dL — ABNORMAL HIGH (ref 70–99)
Glucose-Capillary: 160 mg/dL — ABNORMAL HIGH (ref 70–99)
Glucose-Capillary: 171 mg/dL — ABNORMAL HIGH (ref 70–99)
Glucose-Capillary: 172 mg/dL — ABNORMAL HIGH (ref 70–99)
Glucose-Capillary: 178 mg/dL — ABNORMAL HIGH (ref 70–99)
Glucose-Capillary: 178 mg/dL — ABNORMAL HIGH (ref 70–99)
Glucose-Capillary: 180 mg/dL — ABNORMAL HIGH (ref 70–99)
Glucose-Capillary: 181 mg/dL — ABNORMAL HIGH (ref 70–99)
Glucose-Capillary: 183 mg/dL — ABNORMAL HIGH (ref 70–99)
Glucose-Capillary: 183 mg/dL — ABNORMAL HIGH (ref 70–99)
Glucose-Capillary: 184 mg/dL — ABNORMAL HIGH (ref 70–99)
Glucose-Capillary: 185 mg/dL — ABNORMAL HIGH (ref 70–99)
Glucose-Capillary: 185 mg/dL — ABNORMAL HIGH (ref 70–99)
Glucose-Capillary: 186 mg/dL — ABNORMAL HIGH (ref 70–99)
Glucose-Capillary: 189 mg/dL — ABNORMAL HIGH (ref 70–99)
Glucose-Capillary: 193 mg/dL — ABNORMAL HIGH (ref 70–99)
Glucose-Capillary: 194 mg/dL — ABNORMAL HIGH (ref 70–99)
Glucose-Capillary: 196 mg/dL — ABNORMAL HIGH (ref 70–99)
Glucose-Capillary: 199 mg/dL — ABNORMAL HIGH (ref 70–99)
Glucose-Capillary: 213 mg/dL — ABNORMAL HIGH (ref 70–99)

## 2021-07-28 LAB — BPAM RBC
Blood Product Expiration Date: 202305032359
Blood Product Expiration Date: 202305032359
Blood Product Expiration Date: 202305032359
Blood Product Expiration Date: 202305052359
ISSUE DATE / TIME: 202304031250
ISSUE DATE / TIME: 202304031250
ISSUE DATE / TIME: 202304050625
ISSUE DATE / TIME: 202304050831
Unit Type and Rh: 6200
Unit Type and Rh: 6200
Unit Type and Rh: 6200
Unit Type and Rh: 6200

## 2021-07-28 LAB — TYPE AND SCREEN
ABO/RH(D): A POS
Antibody Screen: NEGATIVE
Unit division: 0
Unit division: 0
Unit division: 0
Unit division: 0

## 2021-07-28 LAB — BASIC METABOLIC PANEL
Anion gap: 7 (ref 5–15)
BUN: 21 mg/dL — ABNORMAL HIGH (ref 6–20)
CO2: 23 mmol/L (ref 22–32)
Calcium: 7.9 mg/dL — ABNORMAL LOW (ref 8.9–10.3)
Chloride: 109 mmol/L (ref 98–111)
Creatinine, Ser: 1.4 mg/dL — ABNORMAL HIGH (ref 0.61–1.24)
GFR, Estimated: >60 mL/min (ref >60)
Glucose, Bld: 194 mg/dL — ABNORMAL HIGH (ref 70–99)
Potassium: 3.2 mmol/L — ABNORMAL LOW (ref 3.5–5.1)
Sodium: 139 mmol/L (ref 135–145)

## 2021-07-28 LAB — CBC
HCT: 27.2 % — ABNORMAL LOW (ref 39.0–52.0)
Hemoglobin: 9.1 g/dL — ABNORMAL LOW (ref 13.0–17.0)
MCH: 29.4 pg (ref 26.0–34.0)
MCHC: 33.5 g/dL (ref 30.0–36.0)
MCV: 87.7 fL (ref 80.0–100.0)
Platelets: 124 10*3/uL — ABNORMAL LOW (ref 150–400)
RBC: 3.1 MIL/uL — ABNORMAL LOW (ref 4.22–5.81)
RDW: 14.3 % (ref 11.5–15.5)
WBC: 9.5 10*3/uL (ref 4.0–10.5)
nRBC: 0 % (ref 0.0–0.2)

## 2021-07-28 LAB — COOXEMETRY PANEL
Carboxyhemoglobin: 1.6 % — ABNORMAL HIGH (ref 0.5–1.5)
Carboxyhemoglobin: 1 % (ref 0.5–1.5)
Carboxyhemoglobin: 1.4 % (ref 0.5–1.5)
Methemoglobin: <0.7 % (ref 0.0–1.5)
Methemoglobin: 0.8 % (ref 0.0–1.5)
Methemoglobin: <0.7 % (ref 0.0–1.5)
O2 Saturation: 67.4 %
O2 Saturation: 98.9 %
O2 Saturation: 58.2 %
Total hemoglobin: 9.4 g/dL — ABNORMAL LOW (ref 12.0–16.0)
Total hemoglobin: 9.5 g/dL — ABNORMAL LOW (ref 12.0–16.0)
Total hemoglobin: 13.9 g/dL (ref 12.0–16.0)

## 2021-07-28 LAB — HEPARIN LEVEL (UNFRACTIONATED): Heparin Unfractionated: <0.1 IU/mL — ABNORMAL LOW (ref 0.30–0.70)

## 2021-07-28 LAB — MAGNESIUM: Magnesium: 1.9 mg/dL (ref 1.7–2.4)

## 2021-07-28 LAB — LACTATE DEHYDROGENASE: LDH: 761 U/L — ABNORMAL HIGH (ref 98–192)

## 2021-07-28 MED ORDER — POTASSIUM CHLORIDE 20 MEQ PO PACK
20.0000 meq | PACK | ORAL | Status: DC
  Administered 2021-07-28: 20 meq
  Filled 2021-07-28: qty 1

## 2021-07-28 MED ORDER — SORBITOL 70 % SOLN
30.0000 mL | Freq: Once | Status: AC
  Administered 2021-07-28: 30 mL
  Filled 2021-07-28: qty 30

## 2021-07-28 MED ORDER — POTASSIUM CHLORIDE 10 MEQ/50ML IV SOLN
10.0000 meq | INTRAVENOUS | Status: AC
  Administered 2021-07-28 (×4): 10 meq via INTRAVENOUS
  Filled 2021-07-28 (×4): qty 50

## 2021-07-28 MED ORDER — DIGOXIN 125 MCG PO TABS: 0.1250 mg | ORAL_TABLET | Freq: Every day | ORAL | Status: DC

## 2021-07-28 MED ORDER — DIGOXIN 125 MCG PO TABS
0.1250 mg | ORAL_TABLET | Freq: Every day | ORAL | Status: DC
  Administered 2021-07-28 – 2021-07-30 (×3): 0.125 mg
  Filled 2021-07-28 (×3): qty 1

## 2021-07-28 MED ORDER — POTASSIUM CHLORIDE 20 MEQ PO PACK
40.0000 meq | PACK | Freq: Three times a day (TID) | ORAL | Status: DC
Start: 2021-07-28 — End: 2021-07-30
  Administered 2021-07-28 – 2021-07-29 (×6): 40 meq
  Filled 2021-07-28 (×6): qty 2

## 2021-07-28 MED ORDER — METHYLPREDNISOLONE SODIUM SUCC 125 MG IJ SOLR
80.0000 mg | Freq: Once | INTRAMUSCULAR | Status: AC
  Administered 2021-07-28: 80 mg via INTRAVENOUS
  Filled 2021-07-28: qty 2

## 2021-07-28 MED ORDER — MAGNESIUM SULFATE 2 GM/50ML IV SOLN
2.0000 g | Freq: Once | INTRAVENOUS | Status: AC
  Administered 2021-07-28: 2 g via INTRAVENOUS
  Filled 2021-07-28: qty 50

## 2021-07-28 MED ORDER — ACETAMINOPHEN 10 MG/ML IV SOLN
1000.0000 mg | Freq: Once | INTRAVENOUS | Status: AC
  Administered 2021-07-28: 1000 mg via INTRAVENOUS
  Filled 2021-07-28: qty 100

## 2021-07-28 MED ORDER — SENNOSIDES 8.8 MG/5ML PO SYRP
5.0000 mL | ORAL_SOLUTION | Freq: Two times a day (BID) | ORAL | Status: DC
  Administered 2021-07-28 – 2021-07-30 (×5): 5 mL
  Filled 2021-07-28 (×5): qty 5

## 2021-07-28 NOTE — Progress Notes (Addendum)
Patient ID: [name] [name], male   DOB: June 14, 1970, 51 y.o.   MRN: [id number] ?  ? ? Advanced Heart Failure Rounding Note ? ?PCP-Cardiologist: None  ? ?Subjective:   ?4/2 LHC : Occluded ostial LAD (culprit), occluded ramus with collaterals, 90% distal LCx prior to left PDA.  PCI to ostial LAD with DES.  ? ?4/3 IABP removed. S/P Impella 5.5. Diuresed with IV lasix. NSVT with amio bolus.  ?4/4 Diuresed with IV lasix.  Remains + . Weight trending up. Milrinone weaned off. Impella increased to P8.  ?4/5 Given 1UPRBC. Started on lasix drip.  ? ?Remains on Dobutamine 2.5 mcg, Norepi  2 mcg, and amio drip.  ? ?Improved urine output. Negative 1743.  ? ?Impella 5.5 P8 Flow 5  ? ?Overnight T Max 100.4 WBC 9.5 . On vanc, micafungin, and aztreonam. Bld CX- NGTD.  ? ? ?Following commands on the vent.   ? ?CO-OX 67%  ?Hgb 9.1  ?LDH [phone] ?Creatinine 1.7>1.4 >1.37  >1.38 ? ?Swan numbers: ?PAP: (29-52)/(15-37) 41/16 ?CVP:  [0 mmHg-27 mmHg] 4 mmHg ?CO:  [4.5 L/min-5.9 L/min] 5.6 L/min ?CI:  [2.3 L/min/m2-3 L/min/m2] 2.8 L/min/m2 ?SVR 1228  ? ? ?Objective:   ?Weight Range: ?85.7 kg ?Body mass index is 27.9 kg/m?.  ? ?Vital Signs:   ?Temp:  [98.6 ?F (37 ?C)-100.4 ?F (38 ?C)] 99 ?F (37.2 ?C) (04/06 0700) ?Pulse Rate:  [94-120] 101 (04/06 0700) ?Resp:  [0-24] 14 (04/06 0700) ?BP: (80-132)/(69-107) 119/98 (04/06 0700) ?SpO2:  [81 %-100 %] 99 % (04/06 0700) ?Arterial Line BP: (76-229)/(67-181) 96/73 (04/06 0700) ?FiO2 (%):  [40 %-50 %] 50 % (04/06 0411) ?Weight:  [85.7 kg] 85.7 kg (04/06 0500) ?Last BM Date : 08/06/2021 ? ?Weight change: ?Filed Weights  ? 07/26/21 0500 07/27/21 0422 07/28/21 0500  ?Weight: 84.7 kg 87 kg 85.7 kg  ? ? ?Intake/Output:  ? ?Intake/Output Summary (Last 24 hours) at 07/28/2021 0718 ?Last data filed at 07/28/2021 0700 ?Gross per 24 hour  ?Intake 4401.07 ml  ?Output 6145 ml  ?Net -1743.93 ml  ?  ? ? ?Physical Exam  ? CVP 11 ?General: Intubated  ?HEENT: ETT ?Neck: supple. JVP 10-11  Carotids 2+ bilat; no  bruits. No lymphadenopathy or thryomegaly appreciated. RIJ  ?Cor: PMI nondisplaced. Regular rate & rhythm. No rubs, gallops or murmurs. R axillary imeplla.  ?Lungs: Coarse throughout.  ?Abdomen: soft, nontender, nondistended. No hepatosplenomegaly. No bruits or masses. Good bowel sounds. ?Extremities: no cyanosis, clubbing, rash, edema. R toe black eschar tip of toe ?Neuro: awake. Following commands. No RUE/RLE movement.  ?GU: foley yellow urine  ? ?Telemetry  ?SR 90-100s personally checked.  ? ?Labs  ?  ?CBC ?Recent Labs  ?  08/10/2021 ?1737 08/20/2021 ?1946 07/27/21 ?0407 07/27/21 ?6063 07/28/21 ?0401 07/28/21 ?0407  ?WBC 14.6*   < > 8.8  --  9.5  --   ?NEUTROABS 11.4*  --   --   --   --   --   ?HGB 11.1*   < > 7.7*   < > 9.1* 8.8*  ?HCT 33.4*   < > 24.5*   < > 27.2* 26.0*  ?MCV 84.6   < > 88.8  --  87.7  --   ?PLT 189   < > 128*  --  124*  --   ? < > = values in this interval not displayed.  ? ?Basic Metabolic Panel ?Recent Labs  ?  07/26/21 ?0419 07/26/21 ?1700 07/27/21 ?0407 07/27/21 ?0747 07/27/21 ?1659 07/28/21 ?0401  07/28/21 ?0407  ?NA 138 137   < >  --  139 138 140  ?K 3.8 3.8   < >  --  3.4* 3.0* 3.1*  ?CL 110 112*   < >  --  110 106  --   ?CO2 21* 20*   < >  --  22 24  --   ?GLUCOSE 156* 181*   < >  --  129* 197*  --   ?BUN 24* 25*   < >  --  22* 21*  --   ?CREATININE 1.40* 1.44*   < >  --  1.39* 1.38*  --   ?CALCIUM 8.3* 8.2*   < >  --  7.9* 7.6*  --   ?MG 1.9 2.2  --  2.3  --  1.9  --   ?PHOS 3.7 3.1  --   --   --   --   --   ? < > = values in this interval not displayed.  ? ?Liver Function Tests ?Recent Labs  ?  07/27/21 ?0407 07/28/21 ?0401  ?AST 109* 87*  ?ALT 63* 67*  ?ALKPHOS 91 110  ?BILITOT 0.9 1.0  ?PROT 5.3* 5.6*  ?ALBUMIN 2.1* 1.9*  ? ?No results for input(s): LIPASE, AMYLASE in the last 72 hours. ?Cardiac Enzymes ?No results for input(s): CKTOTAL, CKMB, CKMBINDEX, TROPONINI in the last 72 hours. ? ?BNP: ?BNP (last 3 results) ?Recent Labs  ?  07/28/2021 ?0017  ?BNP 431.0*  ? ? ?ProBNP (last 3  results) ?No results for input(s): PROBNP in the last 8760 hours. ? ? ?D-Dimer ?No results for input(s): DDIMER in the last 72 hours. ? ?Hemoglobin A1C ?No results for input(s): HGBA1C in the last 72 hours. ? ?Fasting Lipid Panel ?No results for input(s): CHOL, HDL, LDLCALC, TRIG, CHOLHDL, LDLDIRECT in the last 72 hours. ? ?Thyroid Function Tests ?No results for input(s): TSH, T4TOTAL, T3FREE, THYROIDAB in the last 72 hours. ? ?Invalid input(s): FREET3 ? ?Other results: ? ? ?Imaging  ? ? ?DG Abd Portable 1V ? ?Result Date: 07/27/2021 ?CLINICAL DATA:  Post pyloric feeding tube placement EXAM: PORTABLE ABDOMEN - 1 VIEW COMPARISON:  08/15/2021 FINDINGS: No dilated loops of bowel to indicate ileus or obstruction. Feeding tube tip now located at the level of the distal duodenum. Swan Ganz catheter, implanted cardiac device, left ventricular pump partially visualized. IMPRESSION: Tip of feeding tube located in the distal duodenum. Electronically Signed   By: Miachel Roux M.D.   On: 07/27/2021 15:58  ? ?DG Abd Portable 1V ? ?Result Date: 07/27/2021 ?CLINICAL DATA:  Feeding tube placement EXAM: PORTABLE ABDOMEN - 1 VIEW COMPARISON:  None. FINDINGS: Feeding tube with tip in the gastric antrum. Stylet has been removed. IMPRESSION: Feeding tube with tip in the gastric antrum. Electronically Signed   By: Suzy Bouchard M.D.   On: 07/27/2021 11:30   ? ? ?Medications:   ? ? ?Scheduled Medications: ? aspirin  81 mg Per NG tube Daily  ? atorvastatin  80 mg Per NG tube Daily  ? buPROPion  75 mg Per Tube BID  ? chlorhexidine gluconate (MEDLINE KIT)  15 mL Mouth Rinse BID  ? Chlorhexidine Gluconate Cloth  6 each Topical Daily  ? docusate  100 mg Per Tube BID  ? feeding supplement (PROSource TF)  45 mL Per Tube QID  ? folic acid  1 mg Per Tube Daily  ? mouth rinse  15 mL Mouth Rinse BID  ? mouth rinse  15 mL Mouth  Rinse 10 times per day  ? multivitamin with minerals  1 tablet Per Tube Daily  ? mupirocin ointment  1 application. Nasal BID   ? pantoprazole sodium  40 mg Per Tube Daily  ? polyethylene glycol  17 g Per Tube Daily  ? potassium chloride  20 mEq Per Tube Q4H  ? QUEtiapine  25 mg Per Tube BID  ? sodium chloride flush  10-40 mL Intracatheter Q12H  ? sodium chloride flush  3 mL Intravenous Q12H  ? ticagrelor  90 mg Per Tube BID  ? ? ?Infusions: ? sodium chloride    ? sodium chloride 10 mL/hr at 07/28/21 0700  ? sodium chloride    ? amiodarone 30 mg/hr (07/28/21 0700)  ? aztreonam Stopped (07/28/21 0101)  ? dexmedetomidine (PRECEDEX) IV infusion 0.2 mcg/kg/hr (07/28/21 0700)  ? DOBUTamine 2.5 mcg/kg/min (07/28/21 0700)  ? feeding supplement (VITAL 1.5 CAL) 1,000 mL (07/28/21 0606)  ? fentaNYL infusion INTRAVENOUS 75 mcg/hr (07/28/21 0700)  ? furosemide (LASIX) 200 mg in dextrose 5% 100 mL (104m/mL) infusion 10 mg/hr (07/28/21 0700)  ? heparin 50 units/mL (Impella PURGE) in dextrose 5 % 1000 mL bag    ? heparin 300 Units/hr (07/28/21 0700)  ? insulin 15 Units/hr (07/28/21 0700)  ? micafungin ([facility] IV Stopped (07/27/21 1832)  ? norepinephrine (LEVOPHED) Adult infusion 2 mcg/min (07/28/21 0700)  ? potassium chloride 10 mEq (07/28/21 0702)  ? vancomycin Stopped (07/28/21 0303)  ? ? ?PRN Medications: ?sodium chloride, sodium chloride, sodium chloride, sodium chloride, acetaminophen (TYLENOL) oral liquid 160 mg/5 mL, alum & mag hydroxide-simeth, dextrose, fentaNYL, midazolam, ondansetron (ZOFRAN) IV, simethicone, sodium chloride flush, sodium chloride flush ? ?Assessment/Plan  ? ?1. CAD: History of anterior MI 10/21 with PTCA to distal LAD.  Poorly controlled diabetes.  Not sure he has been taking statin given elevated LDL.  This admission with delayed presentation anterior MI, chest pain began 3 days prior to admission. However, TnI jumped from 2234 => 18,731.  Patient now s/p coronary angiography on 4/2 with DES to ostial LAD (occluded, culprit).  Also with occluded ramus with collaterals and 90% distal LCX.  IABP removed with Impella 5.5  placed.  ?Continue ASA, Atorvastatin 80 mg daily, ticagrelor.   ?2. Acute systolic CHF/cardiogenic shock: Echo in 10/21 with EF 65%.  Echo 4/2 with EF <20% with apical and peri-apical akinesis, no LV thrombus, normal

## 2021-07-28 NOTE — Progress Notes (Signed)
3 Days Post-Op Procedure(s) (LRB): ?PLACEMENT OF IMPELLA 5.5 LEFT VENTRICULAR ASSIST DEVICE (N/A) ?TRANSESOPHAGEAL ECHOCARDIOGRAM (TEE) (N/A) ?Subjective: ?Low grade fever persisits, CXR with slightly more edema ?NSR, VAD flow 4.8, coox better post transfusion ?LV with improved pulsatility ?Responsive on vent - PS weaning trials in progress ?Objective: ?Vital signs in last 24 hours: ?Temp:  [99 ?F (37.2 ?C)-100.4 ?F (38 ?C)] 99.3 ?F (37.4 ?C) (04/06 0900) ?Pulse Rate:  [94-120] 100 (04/06 0900) ?Cardiac Rhythm: Sinus tachycardia (04/05 2000) ?Resp:  [0-24] 7 (04/06 0900) ?BP: (81-132)/(69-107) 111/85 (04/06 0900) ?SpO2:  [81 %-100 %] 95 % (04/06 0900) ?Arterial Line BP: (76-229)/(67-181) 106/85 (04/06 0900) ?FiO2 (%):  [40 %-50 %] 40 % (04/06 0802) ?Weight:  [85.7 kg] 85.7 kg (04/06 0500) ? ?Hemodynamic parameters for last 24 hours: ?PAP: (29-52)/(13-37) 30/16 ?CVP:  [4 mmHg-27 mmHg] 11 mmHg ?CO:  [4.7 L/min-5.9 L/min] 5.6 L/min ?CI:  [2.4 L/min/m2-3 L/min/m2] 2.8 L/min/m2 ? ?Intake/Output from previous day: ?04/05 0701 - 04/06 0700 ?In: 4401.1 [I.V.:1652.5; Blood:315; NG/GT:1389; IV Piggyback:759.3] ?Out: [id number]; Drains:10] ?Intake/Output this shift: ?Total I/O ?In: 318.1 [I.V.:112; Other:23.6; NG/GT:120; IV Piggyback:62.4] ?Out: -  ? ?EXAM ? ?No hematoma at Impella incision ?Warm exremities ?Nsr ?Abd nondistended ? ?Lab Results: ?Recent Labs  ?  07/27/21 ?0407 07/27/21 ?[id number] 07/28/21 ?0401 07/28/21 ?0407  ?WBC 8.8  --  9.5  --   ?HGB 7.7*   < > 9.1* 8.8*  ?HCT 24.5*   < > 27.2* 26.0*  ?PLT 128*  --  124*  --   ? < > = values in this interval not displayed.  ? ?BMET:  ?Recent Labs  ?  07/27/21 ?1659 07/28/21 ?0401 07/28/21 ?0407  ?NA 139 138 140  ?K 3.4* 3.0* 3.1*  ?CL 110 106  --   ?CO2 22 24  --   ?GLUCOSE 129* 197*  --   ?BUN 22* 21*  --   ?CREATININE 1.39* 1.38*  --   ?CALCIUM 7.9* 7.6*  --   ?  ?PT/INR:  ?Recent Labs  ?  08/06/2021 ?1103  ?LABPROT 16.4*  ?INR 1.3*  ? ?ABG ?   ?Component Value Date/Time   ? PHART 7.389 07/28/2021 0407  ? HCO3 24.3 07/28/2021 0407  ? TCO2 25 07/28/2021 0407  ? ACIDBASEDEF 4.0 (H) 07/27/2021 0453  ? O2SAT 95 07/28/2021 0407  ? ?CBG (last 3)  ?Recent Labs  ?  07/28/21 ?[id number] 07/28/21 ?0615 07/28/21 ?0818  ?GLUCAP 121* 199* 171*  ? ? ?Assessment/Plan: ?S/P Procedure(s) (LRB): ?PLACEMENT OF IMPELLA 5.5 LEFT VENTRICULAR ASSIST DEVICE (N/A) ?TRANSESOPHAGEAL ECHOCARDIOGRAM (TEE) (N/A) ?Cont lasix drip for net - fluid balance ?Keep Hb . 8.0 ?Gradually increase peripheral iv heparin , now 400u/hr ? ? LOS: 5 days  ? ? ?Dahlia Byes ?07/28/2021 ?  ?

## 2021-07-28 NOTE — Progress Notes (Signed)
ANTICOAGULATION CONSULT NOTE  ? ?Pharmacy Consult for heparin ?Indication: Impella ? ?Allergies  ?Allergen Reactions  ? Penicillins Other (See Comments) and Anaphylaxis  ?  Has patient had a PCN reaction causing immediate rash, facial/tongue/throat swelling, SOB or lightheadedness with hypotension: Yes ?Has patient had a PCN reaction causing severe rash involving mucus membranes or skin necrosis: No ?Has patient had a PCN reaction that required hospitalization: No ?Has patient had a PCN reaction occurring within the last 10 years: No ?If all of the above answers are "NO", then may proceed with Cephalosporin use. ?  ? Empagliflozin Other (See Comments)  ?  Urinary tract infectious disease  ? Liraglutide Nausea And Vomiting  ? ? ?Patient Measurements: ?Height: 5\' 9"  (175.3 cm) ?Weight: 85.7 kg (188 lb 15 oz) ?IBW/kg (Calculated) : 70.7 ?Heparin Dosing Weight: TBW ? ?Vital Signs: ?Temp: 99 ?F (37.2 ?C) (04/06 0700) ?Temp Source: Core (04/06 0400) ?BP: 119/98 (04/06 0700) ?Pulse Rate: 101 (04/06 0700) ? ?Labs: ?Recent Labs  ?  08/21/2021 ?1103 08/18/2021 ?1148 07/26/21 ?0419 07/26/21 ?0705 07/26/21 ?1715 07/27/21 ?0407 07/27/21 ?0453 07/27/21 ?1659 07/27/21 ?1705 07/28/21 ?0401 07/28/21 ?0407  ?HGB 11.8*   < > 10.0*  --   --  7.7*   < >  --  9.5* 9.1* 8.8*  ?HCT 33.5*   < > 29.8*  --   --  24.5*   < >  --  28.8* 27.2* 26.0*  ?PLT 208   < > 192  --   --  128*  --   --   --  124*  --   ?APTT 49*  --   --   --   --   --   --   --   --   --   --   ?LABPROT 16.4*  --   --   --   --   --   --   --   --   --   --   ?INR 1.3*  --   --   --   --   --   --   --   --   --   --   ?HEPARINUNFRC 0.12*  --   --    < > <0.10* <0.10*  --   --   --  <0.10*  --   ?CREATININE 1.65*   < > 1.40*   < >  --  1.37*  --  1.39*  --  1.38*  --   ? < > = values in this interval not displayed.  ? ? ? ?Estimated Creatinine Clearance: 70.2 mL/min (A) (by C-G formula based on SCr of 1.38 mg/dL (H)). ? ? ?Assessment: ?74 yoM admitted with ACS and  cardiogenic shock. Pt s/p LHC with DES and IABP placement 4/2. IABP upgraded to Impella 5.5 4/3. ? ?Heparin infusing through purge at ~11.8 ml/h (590 units/h) and systemic heparin drip 300 uts/hr.  ?Total heparin infusion 890 uts/hr  ?Heparin level < 0.1 undetectable. ?Purge pressure stable 400-500  ?Hgb low stable 8s after prbc 4/5- no overt bleeding noted  ?Patient on 1000 uts/hr prior to procedure and heparin level was only 0.12   ?   ?LDH trending down 1300>700, JP drain and chest tube output stable. Slight oozing at impella insertion site resolved. Transitioned cangrelor infusion to ticagrelor.  ? ? ?Goal of Therapy:  ?Heparin level 0.2-0.5 units/ml  ?Monitor platelets by anticoagulation protocol: Yes ?  ?Plan:  ?Continue heparinized purge ?Increase  systemic heparin  400 units/h ?Daily heparin level and CBC  ? ? ?Leota Sauers Pharm.D. CPP, BCPS ?Clinical Pharmacist ?[phone] ?07/28/2021 7:26 AM  ? ?Please check AMION for all [facility] numbers ?07/28/2021 ? ? ? ?

## 2021-07-28 NOTE — Progress Notes (Signed)
[facility] ADULT ICU REPLACEMENT PROTOCOL ? ? ?The patient does apply for the [facility] Adult ICU Electrolyte Replacment Protocol based on the criteria listed below:  ? ?1.Exclusion criteria: TCTS patients, ECMO patients, and Dialysis patients ?2. Is GFR >/= 30 ml/min? Yes.    ?Patient's GFR today is >60 ?3. Is SCr </= 2? Yes.   ?Patient's SCr is 1.38 mg/dL ?4. Did SCr increase >/= 0.5 in 24 hours? No. ?5.Pt's weight >40kg  Yes.   ?6. Abnormal electrolyte(s):   K 3.0  ?7. Electrolytes replaced per protocol ?8.  Call MD STAT for K+ </= 2.5, Phos </= 1, or Mag </= 1 ?Physician:  S. Sommer ? ?Wilburt Finlay 07/28/2021 5:47 AM ? ?

## 2021-07-28 NOTE — Progress Notes (Signed)
? ?NAME:  [name] [name], MRN:  [id number], DOB:  [birth year], LOS: 5 ?ADMISSION DATE:  08/16/2021, CONSULTATION DATE: 08/15/2021 ?REFERRING MD: Dr. Maryellen Pile, CHIEF COMPLAINT: Chest pain ? ?History of Present Illness:  ?This is a 51 year old white male who presented to the emergency room with new onset chest pain.  Patient states that he had substernal chest pain/pressure radiating to the left side of his chest.  He denies radiculopathy into the arm or into the jaw.  Prior to coming to the hospital he was short of breath and diaphoretic with chest pain.  He denies any nausea or vomiting.  Patient being seen at [facility] emergency room 2 days ago for a diabetic foot ulcer on the right foot.  He was sent home at that time on doxycycline.  His blood sugars generally out-of-control with a last hemoglobin A1c of 11.  Currently the patient states that he is a 1 out of 10 chest pressure.  Patient has a history of myocardial infarction with angioplasty in 2021.  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 ? ?Pertinent  Medical History  ?Uncontrolled diabetes mellitus type 2 ?Right great toe foot ulcer ?Myocardial infarction ?CVA ?Hypertension ? ?Significant Hospital Events: ?Including procedures, antibiotic start and stop dates in addition to other pertinent events   ?4/2 admitted with chest pain.  Suspect missed STEMI ?4/2 cardiac catheterization shows triple-vessel disease with total occlusion of an ostial LAD lesion with successful stent placement.  Continued occlusion of the distal LAD.  Elevated LVEDP at 24 mmHg ?4/2 insertion of intra-aortic balloon pump ?4/2 Foley catheter placed by urology.  Patient has prior stricture ?4/2 echocardiogram shows EF less than 20% with severely decreased LV function with global hypokinesis.  Inferolateral segment moves best. ?4/2 intubated for respiratory distress. ?4/3 Placement of Impella 5.5 left ventricular assist device.Removal  of intraaortic balloon pump from the left femoral artery. Placement of pulmonary artery Swan-Ganz catheter via right IJ. ?4/6 negative fluid balance, overall decrease in pressor requirements, on SBT.  ? ?Subjective/overnight events:  ?On SBT, follows commands but very weak.  ? ?Objective   ?Blood pressure 111/85, pulse 100, temperature 99.3 ?F (37.4 ?C), resp. rate (!) 7, height 5\' 9"  (1.753 m), weight 85.7 kg, SpO2 95 %. ?PAP: (29-52)/(13-37) 30/16 ?CVP:  [4 mmHg-27 mmHg] 11 mmHg ?CO:  [4.7 L/min-5.9 L/min] 5.6 L/min ?CI:  [2.4 L/min/m2-3 L/min/m2] 2.8 L/min/m2  ?Vent Mode: PSV;CPAP ?FiO2 (%):  [40 %-50 %] 40 % ?Set Rate:  [14 bmp] 14 bmp ?Vt Set:  [560 mL] 560 mL ?PEEP:  [5 cmH20] 5 cmH20 ?Pressure Support:  [5 cmH20] 5 cmH20 ?Plateau Pressure:  [13 cmH20-20 cmH20] 20 cmH20  ? ?Intake/Output Summary (Last 24 hours) at 07/28/2021 0927 ?Last data filed at 07/28/2021 0900 ?Gross per 24 hour  ?Intake 4232.66 ml  ?Output 6000 ml  ?Net -1767.34 ml  ? ? ?Filed Weights  ? 07/26/21 0500 07/27/21 0422 07/28/21 0500  ?Weight: 84.7 kg 87 kg 85.7 kg  ? ? ?Examination: ?General: Appears older than stated age.  Intubated on fentanyl propofol for sedation.  Color has improved ?HENT: ET tube OG tube in place. ?Lungs: Clear to auscultation bilaterally.  No wheezing rales or rhonchi noted. Tolerating SBT 5/5 with strong cough.  ?Cardiovascular: Tachycardic, no rub murmur gallop appreciated.  Impella in right subclavian.  No further bleeding from insertion site ?Abdomen: Soft, nontender, nondistended, no rebound/rigidity/guarding.  Positive bowel sounds. ?Extremities: No edema of the  lower extremities.  Distal pulses are weakly present x4 extremities.  Temperature is uniform across all extremities proximal and distal.  The right great toe on the dorsal surface has a 1.5 x 1.5 area of eschar.  No significant erythema.  There is no change in pallor.  Prior groin sites are intact.  ?Neuro: Currently sedated, not following commands ?Skin: No  rash ? ?POC echo shows Impella in suboptimal position abutting the MV cordae. ? ?Assessment & Plan:  ?Critically ill due to acute cardiogenic shock requiring titration of inotropes and vasopressors and management of mechanical cardiac support due to acute coronary syndrome with STEMI ?HFrEF EF estimated 10 to 15% ?Critically ill due to acute hypoxic respiratory failure requiring mechanical ventilation ?Peripheral vascular disease ?Right great toe ulceration with eschar in place ?Uncontrolled diabetes mellitus ?Hypertension ?Wide-complex tachycardia ? ?Plan: ?-Improving hemodynamics.  Currently on low-dose dobutamine and low-dose norepinephrine, latter may be largely counteracting effects of sedation. ?-Aggressive attempt to diuresis today ?- Trial of extubation today.  ?- Continue antibiotics, may still have a distributive component from diabetic foot infection.  Added micafungin and Peraglie.  Fevers persist but hemodynamics are improving ?-Goal would eventually be to transition patient to guideline directed heart failure therapy as we await family to arrive from [place].  Patient does not have any good long-term salvage options apart from optimize medical therapy. ? ?Best Practice (right click and "Reselect all SmartList Selections" daily)  ? ?Diet/type: On tube feeds.  We will place core track today ?DVT prophylaxis: systemic heparin ?GI prophylaxis: PPI ?Lines: N/A ?Foley:  N/A ?Code Status:  full code ?Last date of multidisciplinary goals of care discussion [family updated at bedside.  They are aware of poor prognosis and patient's prior health difficulties.] ? ?CRITICAL CARE ?Performed by: Kipp Brood ? ? ?Total critical care time: 40 minutes ? ?Critical care time was exclusive of separately billable procedures and treating other patients. ? ?Critical care was necessary to treat or prevent imminent or life-threatening deterioration. ? ?Critical care was time spent personally by me on the following activities:  development of treatment plan with patient and/or surrogate as well as nursing, discussions with consultants, evaluation of patient's response to treatment, examination of patient, obtaining history from patient or surrogate, ordering and performing treatments and interventions, ordering and review of laboratory studies, ordering and review of radiographic studies, pulse oximetry, re-evaluation of patient's condition and participation in multidisciplinary rounds. ? ?Kipp Brood, MD FRCPC ?ICU Physician ?[place]  ?Pager: [phone] ?Mobile: [phone] ?After hours: [phone]. ? ? ? ? ?  ?

## 2021-07-29 ENCOUNTER — Inpatient Hospital Stay (HOSPITAL_COMMUNITY): Payer: No Typology Code available for payment source

## 2021-07-29 DIAGNOSIS — R570 Cardiogenic shock: Secondary | ICD-10-CM | POA: Diagnosis not present

## 2021-07-29 LAB — CBC
HCT: 28.4 % — ABNORMAL LOW (ref 39.0–52.0)
HCT: 29.1 % — ABNORMAL LOW (ref 39.0–52.0)
HCT: 26.7 % — ABNORMAL LOW (ref 39.0–52.0)
Hemoglobin: 9.2 g/dL — ABNORMAL LOW (ref 13.0–17.0)
Hemoglobin: 9.4 g/dL — ABNORMAL LOW (ref 13.0–17.0)
Hemoglobin: 8.7 g/dL — ABNORMAL LOW (ref 13.0–17.0)
MCH: 28.3 pg (ref 26.0–34.0)
MCH: 28.1 pg (ref 26.0–34.0)
MCH: 28.8 pg (ref 26.0–34.0)
MCHC: 32.4 g/dL (ref 30.0–36.0)
MCHC: 32.3 g/dL (ref 30.0–36.0)
MCHC: 32.6 g/dL (ref 30.0–36.0)
MCV: 87.4 fL (ref 80.0–100.0)
MCV: 87.1 fL (ref 80.0–100.0)
MCV: 88.4 fL (ref 80.0–100.0)
Platelets: 127 10*3/uL — ABNORMAL LOW (ref 150–400)
Platelets: 156 10*3/uL (ref 150–400)
Platelets: 169 10*3/uL (ref 150–400)
RBC: 3.25 MIL/uL — ABNORMAL LOW (ref 4.22–5.81)
RBC: 3.34 MIL/uL — ABNORMAL LOW (ref 4.22–5.81)
RBC: 3.02 MIL/uL — ABNORMAL LOW (ref 4.22–5.81)
RDW: 14.5 % (ref 11.5–15.5)
RDW: 14.6 % (ref 11.5–15.5)
RDW: 14.6 % (ref 11.5–15.5)
WBC: 9 10*3/uL (ref 4.0–10.5)
WBC: 14.8 10*3/uL — ABNORMAL HIGH (ref 4.0–10.5)
WBC: 17.3 10*3/uL — ABNORMAL HIGH (ref 4.0–10.5)
nRBC: 0 % (ref 0.0–0.2)
nRBC: 0.3 % — ABNORMAL HIGH (ref 0.0–0.2)
nRBC: 0.5 % — ABNORMAL HIGH (ref 0.0–0.2)

## 2021-07-29 LAB — COMPREHENSIVE METABOLIC PANEL
ALT: 72 U/L — ABNORMAL HIGH (ref 0–44)
AST: 94 U/L — ABNORMAL HIGH (ref 15–41)
Albumin: 1.9 g/dL — ABNORMAL LOW (ref 3.5–5.0)
Alkaline Phosphatase: 129 U/L — ABNORMAL HIGH (ref 38–126)
Anion gap: 7 (ref 5–15)
BUN: 27 mg/dL — ABNORMAL HIGH (ref 6–20)
CO2: 25 mmol/L (ref 22–32)
Calcium: 8.6 mg/dL — ABNORMAL LOW (ref 8.9–10.3)
Chloride: 110 mmol/L (ref 98–111)
Creatinine, Ser: 1.44 mg/dL — ABNORMAL HIGH (ref 0.61–1.24)
GFR, Estimated: 60 mL/min — ABNORMAL LOW (ref >60)
Glucose, Bld: 180 mg/dL — ABNORMAL HIGH (ref 70–99)
Potassium: 3.3 mmol/L — ABNORMAL LOW (ref 3.5–5.1)
Sodium: 142 mmol/L (ref 135–145)
Total Bilirubin: 1 mg/dL (ref 0.3–1.2)
Total Protein: 6.3 g/dL — ABNORMAL LOW (ref 6.5–8.1)

## 2021-07-29 LAB — POCT I-STAT 7, (LYTES, BLD GAS, ICA,H+H)
Acid-Base Excess: 3 mmol/L — ABNORMAL HIGH (ref 0.0–2.0)
Bicarbonate: 25.1 mmol/L (ref 20.0–28.0)
Calcium, Ion: 1.2 mmol/L (ref 1.15–1.40)
HCT: 27 % — ABNORMAL LOW (ref 39.0–52.0)
Hemoglobin: 9.2 g/dL — ABNORMAL LOW (ref 13.0–17.0)
O2 Saturation: 97 %
Patient temperature: 37
Potassium: 3.4 mmol/L — ABNORMAL LOW (ref 3.5–5.1)
Sodium: 143 mmol/L (ref 135–145)
TCO2: 26 mmol/L (ref 22–32)
pCO2 arterial: 30.6 mmHg — ABNORMAL LOW (ref 32–48)
pH, Arterial: 7.522 — ABNORMAL HIGH (ref 7.35–7.45)
pO2, Arterial: 76 mmHg — ABNORMAL LOW (ref 83–108)

## 2021-07-29 LAB — CULTURE, RESPIRATORY W GRAM STAIN

## 2021-07-29 LAB — GLUCOSE, CAPILLARY
Glucose-Capillary: 151 mg/dL — ABNORMAL HIGH (ref 70–99)
Glucose-Capillary: 152 mg/dL — ABNORMAL HIGH (ref 70–99)
Glucose-Capillary: 158 mg/dL — ABNORMAL HIGH (ref 70–99)
Glucose-Capillary: 165 mg/dL — ABNORMAL HIGH (ref 70–99)
Glucose-Capillary: 170 mg/dL — ABNORMAL HIGH (ref 70–99)
Glucose-Capillary: 173 mg/dL — ABNORMAL HIGH (ref 70–99)
Glucose-Capillary: 178 mg/dL — ABNORMAL HIGH (ref 70–99)
Glucose-Capillary: 182 mg/dL — ABNORMAL HIGH (ref 70–99)
Glucose-Capillary: 183 mg/dL — ABNORMAL HIGH (ref 70–99)
Glucose-Capillary: 185 mg/dL — ABNORMAL HIGH (ref 70–99)
Glucose-Capillary: 186 mg/dL — ABNORMAL HIGH (ref 70–99)
Glucose-Capillary: 186 mg/dL — ABNORMAL HIGH (ref 70–99)
Glucose-Capillary: 191 mg/dL — ABNORMAL HIGH (ref 70–99)
Glucose-Capillary: 196 mg/dL — ABNORMAL HIGH (ref 70–99)
Glucose-Capillary: 197 mg/dL — ABNORMAL HIGH (ref 70–99)
Glucose-Capillary: 198 mg/dL — ABNORMAL HIGH (ref 70–99)
Glucose-Capillary: 199 mg/dL — ABNORMAL HIGH (ref 70–99)
Glucose-Capillary: 207 mg/dL — ABNORMAL HIGH (ref 70–99)
Glucose-Capillary: 244 mg/dL — ABNORMAL HIGH (ref 70–99)
Glucose-Capillary: 97 mg/dL (ref 70–99)
Glucose-Capillary: 97 mg/dL (ref 70–99)

## 2021-07-29 LAB — BASIC METABOLIC PANEL
Anion gap: 9 (ref 5–15)
BUN: 35 mg/dL — ABNORMAL HIGH (ref 6–20)
CO2: 25 mmol/L (ref 22–32)
Calcium: 8.8 mg/dL — ABNORMAL LOW (ref 8.9–10.3)
Chloride: 108 mmol/L (ref 98–111)
Creatinine, Ser: 1.48 mg/dL — ABNORMAL HIGH (ref 0.61–1.24)
GFR, Estimated: 58 mL/min — ABNORMAL LOW (ref >60)
Glucose, Bld: 225 mg/dL — ABNORMAL HIGH (ref 70–99)
Potassium: 3.5 mmol/L (ref 3.5–5.1)
Sodium: 142 mmol/L (ref 135–145)

## 2021-07-29 LAB — COOXEMETRY PANEL
Carboxyhemoglobin: 0.9 % (ref 0.5–1.5)
Carboxyhemoglobin: 1.6 % — ABNORMAL HIGH (ref 0.5–1.5)
Carboxyhemoglobin: 1.3 % (ref 0.5–1.5)
Methemoglobin: <0.7 % (ref 0.0–1.5)
Methemoglobin: <0.7 % (ref 0.0–1.5)
Methemoglobin: <0.7 % (ref 0.0–1.5)
O2 Saturation: 55.6 %
O2 Saturation: 65 %
O2 Saturation: 50.9 %
Total hemoglobin: 9.6 g/dL — ABNORMAL LOW (ref 12.0–16.0)
Total hemoglobin: 9.1 g/dL — ABNORMAL LOW (ref 12.0–16.0)
Total hemoglobin: 7.8 g/dL — ABNORMAL LOW (ref 12.0–16.0)

## 2021-07-29 LAB — CULTURE, BLOOD (ROUTINE X 2)
Culture: NO GROWTH
Culture: NO GROWTH

## 2021-07-29 LAB — HEMOGLOBIN AND HEMATOCRIT, BLOOD
HCT: 23.9 % — ABNORMAL LOW (ref 39.0–52.0)
Hemoglobin: 7.8 g/dL — ABNORMAL LOW (ref 13.0–17.0)

## 2021-07-29 LAB — LACTATE DEHYDROGENASE: LDH: 732 U/L — ABNORMAL HIGH (ref 98–192)

## 2021-07-29 LAB — VANCOMYCIN, TROUGH: Vancomycin Tr: 14 ug/mL — ABNORMAL LOW (ref 15–20)

## 2021-07-29 LAB — LACTIC ACID, PLASMA: Lactic Acid, Venous: 1.6 mmol/L (ref 0.5–1.9)

## 2021-07-29 LAB — HEPARIN LEVEL (UNFRACTIONATED): Heparin Unfractionated: 0.22 IU/mL — ABNORMAL LOW (ref 0.30–0.70)

## 2021-07-29 LAB — CORTISOL: Cortisol, Plasma: 12.6 ug/dL

## 2021-07-29 MED ORDER — LACTATED RINGERS IV BOLUS
250.0000 mL | Freq: Once | INTRAVENOUS | Status: AC
Start: 2021-07-29 — End: 2021-07-29
  Administered 2021-07-29: 250 mL via INTRAVENOUS

## 2021-07-29 MED ORDER — POTASSIUM CHLORIDE 20 MEQ PO PACK
40.0000 meq | PACK | Freq: Once | ORAL | Status: AC
  Administered 2021-07-29: 40 meq
  Filled 2021-07-29: qty 2

## 2021-07-29 MED ORDER — CHLORHEXIDINE GLUCONATE 0.12 % MT SOLN
15.0000 mL | Freq: Two times a day (BID) | OROMUCOSAL | Status: DC
  Administered 2021-07-29 – 2021-07-30 (×2): 15 mL via OROMUCOSAL
  Filled 2021-07-29 (×2): qty 15

## 2021-07-29 MED ORDER — SPIRONOLACTONE 12.5 MG HALF TABLET
12.5000 mg | ORAL_TABLET | Freq: Every day | ORAL | Status: DC
  Administered 2021-07-29: 12.5 mg
  Filled 2021-07-29: qty 1

## 2021-07-29 MED ORDER — ORAL CARE MOUTH RINSE
15.0000 mL | Freq: Two times a day (BID) | OROMUCOSAL | Status: DC
  Administered 2021-07-29 (×2): 15 mL via OROMUCOSAL

## 2021-07-29 NOTE — Progress Notes (Deleted)
? ?NAME:  [name] [name], MRN:  [id number], DOB:  1970-05-15, LOS: 6 ?ADMISSION DATE:  07/24/2021, CONSULTATION DATE: 07/28/2021 ?REFERRING MD: Dr. Maryellen Pile, CHIEF COMPLAINT: Chest pain ? ?History of Present Illness:  ?This is a 51 year old white male who presented to the emergency room with new onset chest pain.  Patient states that he had substernal chest pain/pressure radiating to the left side of his chest.  He denies radiculopathy into the arm or into the jaw.  Prior to coming to the hospital he was short of breath and diaphoretic with chest pain.  He denies any nausea or vomiting.  Patient being seen at [facility] emergency room 2 days ago for a diabetic foot ulcer on the right foot.  He was sent home at that time on doxycycline.  His blood sugars generally out-of-control with a last hemoglobin A1c of 11.  Currently the patient states that he is a 1 out of 10 chest pressure.  Patient has a history of myocardial infarction with angioplasty in 2021.  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 ? ?Pertinent  Medical History  ?Uncontrolled diabetes mellitus type 2 ?Right great toe foot ulcer ?Myocardial infarction ?CVA ?Hypertension ? ?Significant Hospital Events: ?Including procedures, antibiotic start and stop dates in addition to other pertinent events   ?4/2 admitted with chest pain.  Suspect missed STEMI ?4/2 cardiac catheterization shows triple-vessel disease with total occlusion of an ostial LAD lesion with successful stent placement.  Continued occlusion of the distal LAD.  Elevated LVEDP at 24 mmHg ?4/2 insertion of intra-aortic balloon pump ?4/2 Foley catheter placed by urology.  Patient has prior stricture ?4/2 echocardiogram shows EF less than 20% with severely decreased LV function with global hypokinesis.  Inferolateral segment moves best. ?4/2 intubated for respiratory distress. ?4/3 Placement of Impella 5.5 left ventricular assist device.Removal  of intraaortic balloon pump from the left femoral artery. Placement of pulmonary artery Swan-Ganz catheter via right IJ. ?4/6 negative fluid balance, overall decrease in pressor requirements, on SBT.  ?4/6 tolerated SBT for 2h before tiring ?4/7 successfully extubated following 2-hour SBT ? ?Subjective/overnight events:  ?Extubated today. ? ?Objective   ?Blood pressure (!) 126/92, pulse 80, temperature 99 ?F (37.2 ?C), temperature source Core, resp. rate 15, height 5\' 9"  (1.753 m), weight 81.9 kg, SpO2 94 %. ?PAP: (30-46)/(16-30) 41/20 ?CVP:  [5 mmHg-15 mmHg] 9 mmHg ?CO:  [4.8 L/min-5.3 L/min] 4.8 L/min ?CI:  [2.5 L/min/m2-2.7 L/min/m2] 2.5 L/min/m2  ?Vent Mode: PSV;CPAP ?FiO2 (%):  [40 %] 40 % ?Set Rate:  [14 bmp] 14 bmp ?Vt Set:  [490 mL-560 mL] 490 mL ?PEEP:  [5 cmH20] 5 cmH20 ?Pressure Support:  [10 cmH20] 10 cmH20 ?Plateau Pressure:  [16 cmH20-20 cmH20] 18 cmH20  ? ?Intake/Output Summary (Last 24 hours) at 07/29/2021 1110 ?Last data filed at 07/29/2021 1000 ?Gross per 24 hour  ?Intake 4582.36 ml  ?Output 6714 ml  ?Net -2131.64 ml  ? ? ?Filed Weights  ? 07/27/21 0422 07/28/21 0500 07/29/21 0444  ?Weight: 87 kg 85.7 kg 81.9 kg  ? ? ?Examination: ?General: Appears older than stated age.  Intubated on dexmedetomidine alone for sedation.  Color has improved ?HENT: ET tube OG tube in place. ?Lungs: Clear to auscultation bilaterally.  No wheezing rales or rhonchi noted. Tolerating SBT 5/5 with strong cough.  ?Cardiovascular: Tachycardic, no rub murmur gallop appreciated.  Impella in right subclavian.  No further bleeding from insertion site ?Abdomen: Soft, nontender, nondistended, no  rebound/rigidity/guarding.  Positive bowel sounds. ?Extremities: No edema of the lower extremities.  Distal pulses are weakly present x4 extremities.  Temperature is uniform across all extremities proximal and distal.  The right great toe on the dorsal surface has a 1.5 x 1.5 area of eschar.  No significant erythema.  There is no change in  pallor.  Prior groin sites are intact.  ?Neuro: Currently sedated, not following commands ?Skin: No rash ? ?POC echo shows Impella in suboptimal position abutting the MV cordae. ? ?Assessment & Plan:  ?Critically ill due to acute cardiogenic shock requiring titration of inotropes and vasopressors and management of mechanical cardiac support due to acute coronary syndrome with STEMI ?HFrEF EF estimated 10 to 15% ?Critically ill due to acute hypoxic respiratory failure requiring mechanical ventilation ?Peripheral vascular disease ?Right great toe ulceration with eschar in place ?Uncontrolled diabetes mellitus ?Hypertension ?Wide-complex tachycardia ? ?Plan: ?-Improving hemodynamics.  Currently on low-dose dobutamine and low-dose norepinephrine, latter may be largely counteracting effects of sedation. ?-Slow diuresis today, drop furosemide infusion from 10-8 as creatinine increasing slightly and CVP in single digits. ?-Decreased dobutamine to 1.25 today.  Consider stopping tomorrow. ?-Continue Impella support at P8.  Gradual Impella wean starting tomorrow. ?- Trial of extubation today.  ?-Fever has now resolved.  May be a result of improving perfusion.  5 more days of antibacterial agents and complete further 10 days of micafungin. ? ?-Goal would eventually be to transition patient to guideline directed heart failure therapy as we await family to arrive from [place].  Patient does not have any good long-term salvage options apart from optimize medical therapy. ? ?Best Practice (right click and "Reselect all SmartList Selections" daily)  ? ?Diet/type: On tube feeds.  We will place core track today ?DVT prophylaxis: systemic heparin ?GI prophylaxis: PPI ?Lines: N/A ?Foley:  N/A ?Code Status:  full code ?Last date of multidisciplinary goals of care discussion [family updated at bedside.  They are aware of poor prognosis and patient's prior health difficulties.] ? ?CRITICAL CARE ?Performed by: Kipp Brood ? ? ?Total  critical care time: 40 minutes ? ?Critical care time was exclusive of separately billable procedures and treating other patients. ? ?Critical care was necessary to treat or prevent imminent or life-threatening deterioration. ? ?Critical care was time spent personally by me on the following activities: development of treatment plan with patient and/or surrogate as well as nursing, discussions with consultants, evaluation of patient's response to treatment, examination of patient, obtaining history from patient or surrogate, ordering and performing treatments and interventions, ordering and review of laboratory studies, ordering and review of radiographic studies, pulse oximetry, re-evaluation of patient's condition and participation in multidisciplinary rounds. ? ?Kipp Brood, MD FRCPC ?ICU Physician ?[place]  ?Pager: [phone] ?Mobile: [phone] ?After hours: [phone]. ? ? ? ? ?  ?

## 2021-07-29 NOTE — Progress Notes (Addendum)
ANTICOAGULATION CONSULT NOTE  ? ?Pharmacy Consult for heparin ?Indication: Impella ? ?Allergies  ?Allergen Reactions  ? Penicillins Other (See Comments) and Anaphylaxis  ?  Has patient had a PCN reaction causing immediate rash, facial/tongue/throat swelling, SOB or lightheadedness with hypotension: Yes ?Has patient had a PCN reaction causing severe rash involving mucus membranes or skin necrosis: No ?Has patient had a PCN reaction that required hospitalization: No ?Has patient had a PCN reaction occurring within the last 10 years: No ?If all of the above answers are "NO", then may proceed with Cephalosporin use. ?  ? Empagliflozin Other (See Comments)  ?  Urinary tract infectious disease  ? Liraglutide Nausea And Vomiting  ? ? ?Patient Measurements: ?Height: 5\' 9"  (175.3 cm) ?Weight: 81.9 kg (180 lb 8.9 oz) ?IBW/kg (Calculated) : 70.7 ?Heparin Dosing Weight: TBW ? ?Vital Signs: ?Temp: 99 ?F (37.2 ?C) (04/07 0800) ?Temp Source: Core (04/07 0800) ?BP: 126/92 (04/07 0400) ?Pulse Rate: 83 (04/07 0800) ? ?Labs: ?Recent Labs  ?  07/27/21 ?0407 07/27/21 ?[id number] 07/28/21 ?0401 07/28/21 ?0407 07/28/21 ?1509 07/29/21 ?[id number] 07/29/21 ?0315  ?HGB 7.7*   < > 9.1* 8.8*  --  9.2* 9.2*  ?HCT 24.5*   < > 27.2* 26.0*  --  27.0* 28.4*  ?PLT 128*  --  124*  --   --   --  127*  ?HEPARINUNFRC <0.10*  --  <0.10*  --   --   --  0.22*  ?CREATININE 1.37*   < > 1.38*  --  1.40*  --  1.44*  ? < > = values in this interval not displayed.  ? ? ? ?Estimated Creatinine Clearance: 62.1 mL/min (A) (by C-G formula based on SCr of 1.44 mg/dL (H)). ? ? ?Assessment: ?39 yoM admitted with ACS and cardiogenic shock. Pt s/p LHC with DES and IABP placement 4/2. IABP upgraded to Impella 5.5 4/3. ? ?Heparin infusing through purge at ~11.8 ml/h (590 units/h) and systemic heparin drip 400 uts/hr.  ?Total heparin infusion 990 uts/hr  ?Heparin level 0.22 at goal. ?Purge pressure stable 400-500  ?Hgb low stable 8s after prbc 4/5- no overt bleeding noted   ?   ?LDH  trending down 1300>700, JP drain and chest tube output stable. Slight oozing at impella insertion site resolved. Transitioned cangrelor infusion to ticagrelor.  ? ? ?Goal of Therapy:  ?Heparin level 0.2-0.5 units/ml  ?Monitor platelets by anticoagulation protocol: Yes ?  ?Plan:  ?Continue heparinized purge ?Increase  systemic heparin 400 units/h ?Daily heparin level and CBC  ? ? ?Bonnita Nasuti Pharm.D. CPP, BCPS ?Clinical Pharmacist ?[phone] ?07/29/2021 9:20 AM  ? ?Please check AMION for all [facility] numbers ?07/29/2021 ? ? ? ?

## 2021-07-29 NOTE — Progress Notes (Signed)
Pharmacy Antibiotic Note ? ?[name] [name] is a 51 y.o. male admitted on 08/04/2021 with  diabetic foot ulcer .  Pharmacy has been consulted for Vancomycin and Aztreonam dosing. Appears pt may have started doxycycline for this a few days ago. ? ?Vanc trough came back at 14 tonight. The level is 2 hrs away from dose but should still be in range. Scr stable at 1.48. Stop date in place. ? ?Plan: ?Aztreonam 1gm IV q8h ?Vancomycin 1500 mg IV Q 24 hrs ? ? ?Height: 5\' 9"  (175.3 cm) ?Weight: 81.9 kg (180 lb 8.9 oz) ?IBW/kg (Calculated) : 70.7 ? ?Temp (24hrs), Avg:98.8 ?F (37.1 ?C), Min:98.4 ?F (36.9 ?C), Max:99.1 ?F (37.3 ?C) ? ?Recent Labs  ?Lab 08/15/2021 ?0017 08/04/2021 ?0350 07/28/2021 ?09/23/21 08-Aug-2021 ?0021 2021/08/08 ?09/24/21 07/27/21 ?0407 07/27/21 ?1659 07/28/21 ?0401 07/28/21 ?1509 07/29/21 ?0315 07/29/21 ?1410 07/29/21 ?1655 07/29/21 ?1959 07/29/21 ?2157  ?WBC 16.6*   < >  --   --    < > 8.8  --  9.5  --  9.0 14.8*  --  17.3*  --   ?CREATININE 1.54*  --  1.76* 1.62*   < > 1.37* 1.39* 1.38* 1.40* 1.44*  --  1.48*  --   --   ?LATICACIDVEN 1.9  --  2.3* 1.6  --   --   --   --   --   --   --   --   --   --   ?VANCOTROUGH  --   --   --   --   --   --   --   --   --   --   --   --   --  14*  ? < > = values in this interval not displayed.  ? ?  ?Estimated Creatinine Clearance: 60.4 mL/min (A) (by C-G formula based on SCr of 1.48 mg/dL (H)).   ? ?Allergies  ?Allergen Reactions  ? Penicillins Other (See Comments) and Anaphylaxis  ?  Has patient had a PCN reaction causing immediate rash, facial/tongue/throat swelling, SOB or lightheadedness with hypotension: Yes ?Has patient had a PCN reaction causing severe rash involving mucus membranes or skin necrosis: No ?Has patient had a PCN reaction that required hospitalization: No ?Has patient had a PCN reaction occurring within the last 10 years: No ?If all of the above answers are "NO", then may proceed with Cephalosporin use. ?  ? Empagliflozin Other (See Comments)  ?  Urinary tract  infectious disease  ? Liraglutide Nausea And Vomiting  ? ? ?Antimicrobials this admission: ?4/1 Zosyn >>  ?4/1 Vanc >>  ? ?Microbiology results: ?3/28 BCx: negative ?4/2 BCx: NGTD ?4/2 MRSA PCR: positive ? ?4/28, PharmD, BCIDP, AAHIVP, CPP ?Infectious Disease Pharmacist ?07/29/2021 10:38 PM ? ? ? ? ? ? ?

## 2021-07-29 NOTE — Procedures (Signed)
Extubation Procedure Note ? ?Patient Details:   ?Name: [name] [name] ?DOB: Aug 26, 1970 ?MRN: [id number] ?  ?Airway Documentation:  ?  ?Vent end date: 07/29/21 Vent end time: 1038  ? ?Evaluation ? O2 sats: stable throughout ?Complications: No apparent complications ?Patient did tolerate procedure well. ?Bilateral Breath Sounds: Clear, Diminished ?  ?Yes ? ?Patient extubated per MD order. Positive cuff leak. No stridor noted. Vitals are stable on 6L [place]. RN at bedside. ? Harmon Dun Claus Silvestro ?07/29/2021, 10:40 AM ? ?

## 2021-07-29 NOTE — Progress Notes (Signed)
CT Surgery PM rounds ? ?Drainage through JP drain in Impella pocket increased after extubation. About 250 cc, prior drainage  was scant. Appears to be lysis of old hematoma. Since JP bulb lost suction  with increased output the drain was converted to cont wall suction to a cannister. ? ?This increased output related to increased peripheral iv heparin in addition to [place] for recent PCI and heparin in purge in Impella circuit. ? ?No sig drop in Hb but will monitor, ?Peripheral heparin off until am ? ?New sterile dressing applied to incision site ?Fluid from the pocket drain sent for culture ?

## 2021-07-29 NOTE — Progress Notes (Signed)
Paged that MAPs are low, tachycardic, CVP 0-2. JP drain output 600 cc dark blood throughout the day from impella site. Hb recheck at 19:59 (8.7) from bl 8.8-9.4. Off levophed most of the day and MAPs stable 80-90s until past few hours. Still on low dose dobutamine 1.25 mcg, amio at 30 mg/h. Will give small IVF bolus, 250 cc LR now. Recheck Hb/lactate/co-ox and zero'd CVP at MN. If Hb worsening transfuse. If co-ox/lactate worsening and Hb stable then restart levophed if MAPs still low. If fluid responsive then can give another 250 cc if all other labwork reassuring. Will f/u after labs checked.  ?

## 2021-07-29 NOTE — Progress Notes (Addendum)
Patient ID: [name] [name], male   DOB: 04/16/1971, 51 y.o.   MRN: [id number] ?  ? ? Advanced Heart Failure Rounding Note ? ?PCP-Cardiologist: None  ? ?Subjective:   ?4/2 LHC : Occluded ostial LAD (culprit), occluded ramus with collaterals, 90% distal LCx prior to left PDA.  PCI to ostial LAD with DES.  ?4/3 IABP removed. S/P Impella 5.5. Diuresed with IV lasix. NSVT with amio bolus.  ?4/4 Diuresed with IV lasix.  Remains + . Weight trending up. Milrinone weaned off. Impella increased to P8.  ?4/5 Given 1UPRBC. Started on lasix drip.  ? ?Now off NE. Remains on DBA 2.5 + Impella P-8, Flow 4.5, CI 2.5, Co-ox 56%.   ? ?MAPs elevated in upper 90s. SVR 1494 ? ?Lasix gtt 10/hr w/ good UOP, 7 L out yesterday though intake high. Only net negative 2.4L. Wt down 8 lb. CVP 9  ? ?Remains intubated. FiO2 40%.  ? ?Fevers resolved . On vanc, micafungin, and aztreonam. Bld CX- NGTD.  ? ?Hgb 9.2  ?LDH 716 837 3531 >239>532 ?Creatinine 1.7>1.4 >1.37  >1.38>1.44  ? ?Swan Numbers  ?PAP: (30-46)/(9-30) 41/20 ?CVP:  [5 mmHg-15 mmHg] 9 mmHg ?CO:  [4.8 L/min-5.3 L/min] 4.8 L/min ?CI:  [2.5 L/min/m2-2.7 L/min/m2] 2.5 L/min/m2 ?SVR 1494  ? ? ?Objective:   ?Weight Range: ?81.9 kg ?Body mass index is 26.66 kg/m?.  ? ?Vital Signs:   ?Temp:  [98.4 ?F (36.9 ?C)-102 ?F (38.9 ?C)] 99 ?F (37.2 ?C) (04/07 0700) ?Pulse Rate:  [76-110] 79 (04/07 0739) ?Resp:  [0-27] 17 (04/07 0739) ?BP: (97-126)/(79-92) 126/92 (04/07 0400) ?SpO2:  [90 %-99 %] 96 % (04/07 0739) ?Arterial Line BP: (85-131)/(70-96) 123/89 (04/07 0700) ?FiO2 (%):  [40 %] 40 % (04/07 0800) ?Weight:  [81.9 kg] 81.9 kg (04/07 0444) ?Last BM Date : 07/26/2021 ? ?Weight change: ?Filed Weights  ? 07/27/21 0422 07/28/21 0500 07/29/21 0444  ?Weight: 87 kg 85.7 kg 81.9 kg  ? ? ?Intake/Output:  ? ?Intake/Output Summary (Last 24 hours) at 07/29/2021 0839 ?Last data filed at 07/29/2021 0800 ?Gross per 24 hour  ?Intake 4579.09 ml  ?Output 6904 ml  ?Net -2324.91 ml  ?  ? ? ?Physical Exam  ?CVP 9  ?General:  Intubated, awake and following commands   ?HEENT: + ETT ?Neck: supple. JVP 9  Carotids 2+ bilat; no bruits. No lymphadenopathy or thryomegaly appreciated. RIJ  ?Cor: PMI nondisplaced. Regular rate & rhythm. No rubs, gallops or murmurs. R axillary imeplla.  ?Lungs: intubated and course  ?Abdomen: soft, nontender, nondistended. No hepatosplenomegaly. No bruits or masses. Good bowel sounds. ?Extremities: no cyanosis, clubbing, rash, edema. R toe black eschar tip of toe ?Neuro: intubated, awake on vent and following commands.  ?GU: + foley, clear urine  ? ?Telemetry  ?SR 90-100s personally checked.  ? ?Labs  ?  ?CBC ?Recent Labs  ?  07/28/21 ?0401 07/28/21 ?0407 07/29/21 ?0313 07/29/21 ?0315  ?WBC 9.5  --   --  9.0  ?HGB 9.1*   < > 9.2* 9.2*  ?HCT 27.2*   < > 27.0* 28.4*  ?MCV 87.7  --   --  87.4  ?PLT 124*  --   --  127*  ? < > = values in this interval not displayed.  ? ?Basic Metabolic Panel ?Recent Labs  ?  07/26/21 ?1700 07/27/21 ?0407 07/27/21 ?0747 07/27/21 ?1659 07/28/21 ?0401 07/28/21 ?0407 07/28/21 ?1509 07/29/21 ?0233 07/29/21 ?0315  ?NA 137   < >  --    < > 138   < >  139 143 142  ?K 3.8   < >  --    < > 3.0*   < > 3.2* 3.4* 3.3*  ?CL 112*   < >  --    < > 106  --  109  --  110  ?CO2 20*   < >  --    < > 24  --  23  --  25  ?GLUCOSE 181*   < >  --    < > 197*  --  194*  --  180*  ?BUN 25*   < >  --    < > 21*  --  21*  --  27*  ?CREATININE 1.44*   < >  --    < > 1.38*  --  1.40*  --  1.44*  ?CALCIUM 8.2*   < >  --    < > 7.6*  --  7.9*  --  8.6*  ?MG 2.2  --  2.3  --  1.9  --   --   --   --   ?PHOS 3.1  --   --   --   --   --   --   --   --   ? < > = values in this interval not displayed.  ? ?Liver Function Tests ?Recent Labs  ?  07/28/21 ?0401 07/29/21 ?0315  ?AST 87* 94*  ?ALT 67* 72*  ?ALKPHOS 110 129*  ?BILITOT 1.0 1.0  ?PROT 5.6* 6.3*  ?ALBUMIN 1.9* 1.9*  ? ?No results for input(s): LIPASE, AMYLASE in the last 72 hours. ?Cardiac Enzymes ?No results for input(s): CKTOTAL, CKMB, CKMBINDEX, TROPONINI in the  last 72 hours. ? ?BNP: ?BNP (last 3 results) ?Recent Labs  ?  08/13/2021 ?0017  ?BNP 431.0*  ? ? ?ProBNP (last 3 results) ?No results for input(s): PROBNP in the last 8760 hours. ? ? ?D-Dimer ?No results for input(s): DDIMER in the last 72 hours. ? ?Hemoglobin A1C ?No results for input(s): HGBA1C in the last 72 hours. ? ?Fasting Lipid Panel ?No results for input(s): CHOL, HDL, LDLCALC, TRIG, CHOLHDL, LDLDIRECT in the last 72 hours. ? ?Thyroid Function Tests ?No results for input(s): TSH, T4TOTAL, T3FREE, THYROIDAB in the last 72 hours. ? ?Invalid input(s): FREET3 ? ?Other results: ? ? ?Imaging  ? ? ?DG Chest Port 1 View ? ?Result Date: 07/29/2021 ?CLINICAL DATA:  LVAD EXAM: PORTABLE CHEST 1 VIEW COMPARISON:  Yesterday FINDINGS: Endotracheal tube with tip at the clavicular heads. Swan-Ganz catheter with tip directed to the right main pulmonary artery. Left ventricular assist device in unchanged positioning. Left PICC with tip at the SVC. Feeding tube which at least reaches the stomach. Improved aeration. There is still low lung volumes with indistinct density at the bases. No definite or significant pleural effusion. No pneumothorax. IMPRESSION: 1. Stable hardware positioning as described. 2. Mildly improved aeration with residual opacity primarily attributed to atelectasis. Electronically Signed   By: Jorje Guild M.D.   On: 07/29/2021 06:49   ? ? ?Medications:   ? ? ?Scheduled Medications: ? aspirin  81 mg Per NG tube Daily  ? atorvastatin  80 mg Per NG tube Daily  ? buPROPion  75 mg Per Tube BID  ? chlorhexidine gluconate (MEDLINE KIT)  15 mL Mouth Rinse BID  ? Chlorhexidine Gluconate Cloth  6 each Topical Daily  ? digoxin  0.125 mg Per Tube Daily  ? docusate  100 mg Per Tube BID  ? feeding supplement (PROSource  TF)  45 mL Per Tube QID  ? folic acid  1 mg Per Tube Daily  ? mouth rinse  15 mL Mouth Rinse 10 times per day  ? multivitamin with minerals  1 tablet Per Tube Daily  ? mupirocin ointment  1 application.  Nasal BID  ? pantoprazole sodium  40 mg Per Tube Daily  ? polyethylene glycol  17 g Per Tube Daily  ? potassium chloride  40 mEq Per Tube TID  ? QUEtiapine  25 mg Per Tube BID  ? sennosides  5 mL Per Tube BID  ? sodium chloride flush  10-40 mL Intracatheter Q12H  ? sodium chloride flush  3 mL Intravenous Q12H  ? ticagrelor  90 mg Per Tube BID  ? ? ?Infusions: ? sodium chloride    ? sodium chloride Stopped (07/29/21 0754)  ? sodium chloride    ? amiodarone 30 mg/hr (07/29/21 0800)  ? aztreonam 200 mL/hr at 07/29/21 0800  ? dexmedetomidine (PRECEDEX) IV infusion 0.2 mcg/kg/hr (07/29/21 0800)  ? DOBUTamine 2.5 mcg/kg/min (07/29/21 0800)  ? feeding supplement (VITAL 1.5 CAL) 1,000 mL (07/29/21 0400)  ? fentaNYL infusion INTRAVENOUS Stopped (07/28/21 0734)  ? furosemide (LASIX) 200 mg in dextrose 5% 100 mL (69m/mL) infusion 10 mg/hr (07/29/21 0800)  ? heparin 50 units/mL (Impella PURGE) in dextrose 5 % 1000 mL bag    ? heparin 400 Units/hr (07/29/21 0800)  ? insulin 15 Units/hr (07/29/21 0800)  ? micafungin ([facility] IV Stopped (07/28/21 1818)  ? norepinephrine (LEVOPHED) Adult infusion Stopped (07/28/21 0847)  ? vancomycin Stopped (07/29/21 0202)  ? ? ?PRN Medications: ?sodium chloride, sodium chloride, sodium chloride, sodium chloride, acetaminophen (TYLENOL) oral liquid 160 mg/5 mL, alum & mag hydroxide-simeth, dextrose, fentaNYL, midazolam, ondansetron (ZOFRAN) IV, simethicone, sodium chloride flush, sodium chloride flush ? ?Assessment/Plan  ? ?1. CAD: History of anterior MI 10/21 with PTCA to distal LAD.  Poorly controlled diabetes.  Not sure he has been taking statin given elevated LDL.  This admission with delayed presentation anterior MI, chest pain began 3 days prior to admission. However, TnI jumped from 2234 => 18,731.  Patient now s/p coronary angiography on 4/2 with DES to ostial LAD (occluded, culprit).  Also with occluded ramus with collaterals and 90% distal LCX.  IABP removed with Impella 5.5 placed.   ?Continue ASA, Atorvastatin 80 mg daily, ticagrelor.   ?2. Acute systolic CHF/cardiogenic shock: Echo in 10/21 with EF 65%.  Echo 4/2 with EF <20% with apical and peri-apical akinesis, no LV thrombus, normal

## 2021-07-29 NOTE — Progress Notes (Signed)
4 Days Post-Op Procedure(s) (LRB): ?PLACEMENT OF IMPELLA 5.5 LEFT VENTRICULAR ASSIST DEVICE (N/A) ?TRANSESOPHAGEAL ECHOCARDIOGRAM (TEE) (N/A) ?Subjective: ?Continues to slowly improve following STEMI with delayed presentation, successful PCI of occluded LAD, cardiogenic shock needing Impella 5.5 April 3. ?Afebrile now with CXR clearing- extubation planned ?Hemodynamics improved- weaning inotropes while maintaining adequate perfusion with P8 setting 4.5 flow ?Peripheral heparin slowly titrated up- no hematoma at insertion site.On Brillinta for PCI ?Objective: ?Vital signs in last 24 hours: ?Temp:  [98.4 ?F (36.9 ?C)-102 ?F (38.9 ?C)] 99 ?F (37.2 ?C) (04/07 0800) ?Pulse Rate:  [76-110] 80 (04/07 0924) ?Cardiac Rhythm: Normal sinus rhythm (04/07 0800) ?Resp:  [0-27] 15 (04/07 0800) ?BP: (97-126)/(79-92) 126/92 (04/07 0400) ?SpO2:  [91 %-99 %] 97 % (04/07 0800) ?Arterial Line BP: (85-131)/(70-96) 129/91 (04/07 0800) ?FiO2 (%):  [40 %] 40 % (04/07 0800) ?Weight:  [81.9 kg] 81.9 kg (04/07 0444) ? ?Hemodynamic parameters for last 24 hours: ?PAP: (30-46)/(16-30) 41/20 ?CVP:  [5 mmHg-15 mmHg] 9 mmHg ?CO:  [4.8 L/min-5.3 L/min] 4.8 L/min ?CI:  [2.5 L/min/m2-2.7 L/min/m2] 2.5 L/min/m2 ? ?Intake/Output from previous day: ?04/06 0701 - 04/07 0700 ?In: 4611.2 [I.V.:1539.8; NG/GT:1620; IV Piggyback:1210.2] ?Out: 7004 [id number] ?Intake/Output this shift: ?Total I/O ?In: 550.4 [I.V.:175.5; Other:34.8; NG/GT:240; IV Piggyback:100.1] ?Out: 710 [Urine:710] ? ?Exam ?Responsive on vent ?Lungs clear ?Extrem warm ?Impella secured, min output from JP drain ? ?Lab Results: ?Recent Labs  ?  07/28/21 ?0401 07/28/21 ?0407 07/29/21 ?0313 07/29/21 ?0315  ?WBC 9.5  --   --  9.0  ?HGB 9.1*   < > 9.2* 9.2*  ?HCT 27.2*   < > 27.0* 28.4*  ?PLT 124*  --   --  127*  ? < > = values in this interval not displayed.  ? ?BMET:  ?Recent Labs  ?  07/28/21 ?1509 07/29/21 ?[id number] 07/29/21 ?0315  ?NA 139 143 142  ?K 3.2* 3.4* 3.3*  ?CL 109  --  110  ?CO2 23  --   25  ?GLUCOSE 194*  --  180*  ?BUN 21*  --  27*  ?CREATININE 1.40*  --  1.44*  ?CALCIUM 7.9*  --  8.6*  ?  ?PT/INR: No results for input(s): LABPROT, INR in the last 72 hours. ?ABG ?   ?Component Value Date/Time  ? PHART 7.522 (H) 07/29/2021 [id number]  ? HCO3 25.1 07/29/2021 0313  ? TCO2 26 07/29/2021 0313  ? ACIDBASEDEF 4.0 (H) 07/27/2021 0453  ? O2SAT 55.6 07/29/2021 0315  ? ?CBG (last 3)  ?Recent Labs  ?  07/29/21 ?0701 07/29/21 ?0815 07/29/21 ?1015  ?GLUCAP 158* 173* 152*  ? ? ?Assessment/Plan: ?S/P Procedure(s) (LRB): ?PLACEMENT OF IMPELLA 5.5 LEFT VENTRICULAR ASSIST DEVICE (N/A) ?TRANSESOPHAGEAL ECHOCARDIOGRAM (TEE) (N/A) ?Wean vent ?Low dose peripheral heparin ?Impella wean per HF team ? ? LOS: 6 days  ? ? ?Dahlia Byes ?07/29/2021 ?  ?

## 2021-07-29 NOTE — Progress Notes (Signed)
eLink Physician-Brief Progress Note ?Patient Name: [name] [name] ?DOB: August 11, 1970 ?MRN: [id number] ? ? ?Date of Service ? 07/29/2021  ?HPI/Events of Note ? ABG with respiratory alkalosis  ?eICU Interventions ? Reduce TV to 7cc (490cc)  ? ? ? ?Intervention Category ?Intermediate Interventions: Diagnostic test evaluation ? ?Bradey Luzier Mechele Collin ?07/29/2021, 3:35 AM ?

## 2021-07-29 NOTE — Progress Notes (Signed)
eLink Physician-Brief Progress Note ?Patient Name: [name] [name] ?DOB: 1971/02/03 ?MRN: [id number] ? ? ?Date of Service ? 07/29/2021  ?HPI/Events of Note ? K 3.3 ?Prior labs with mild hypokalemia ?Patient on lasix gtt  ?eICU Interventions ? Replete Kcl 68meq once ?Continue scheduled KCL 19meq TID  ? ? ? ?Intervention Category ?Intermediate Interventions: Electrolyte abnormality - evaluation and management ? ?[name] [name] ?07/29/2021, 4:41 AM ?

## 2021-07-29 NOTE — Progress Notes (Signed)
? ?NAME:  [name] [name], MRN:  [id number], DOB:  1971-03-25, LOS: 6 ?ADMISSION DATE:  08/02/2021, CONSULTATION DATE: 08/14/2021 ?REFERRING MD: Dr. Maryellen Pile, CHIEF COMPLAINT: Chest pain ? ?History of Present Illness:  ?This is a 51 year old white male who presented to the emergency room with new onset chest pain.  Patient states that he had substernal chest pain/pressure radiating to the left side of his chest.  He denies radiculopathy into the arm or into the jaw.  Prior to coming to the hospital he was short of breath and diaphoretic with chest pain.  He denies any nausea or vomiting.  Patient being seen at [facility] emergency room 2 days ago for a diabetic foot ulcer on the right foot.  He was sent home at that time on doxycycline.  His blood sugars generally out-of-control with a last hemoglobin A1c of 11.  Currently the patient states that he is a 1 out of 10 chest pressure.  Patient has a history of myocardial infarction with angioplasty in 2021.  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 ? ?Pertinent  Medical History  ?Uncontrolled diabetes mellitus type 2 ?Right great toe foot ulcer ?Myocardial infarction ?CVA ?Hypertension ? ?Significant Hospital Events: ?Including procedures, antibiotic start and stop dates in addition to other pertinent events   ?4/2 admitted with chest pain.  Suspect missed STEMI ?4/2 cardiac catheterization shows triple-vessel disease with total occlusion of an ostial LAD lesion with successful stent placement.  Continued occlusion of the distal LAD.  Elevated LVEDP at 24 mmHg ?4/2 insertion of intra-aortic balloon pump ?4/2 Foley catheter placed by urology.  Patient has prior stricture ?4/2 echocardiogram shows EF less than 20% with severely decreased LV function with global hypokinesis.  Inferolateral segment moves best. ?4/2 intubated for respiratory distress. ?4/3 Placement of Impella 5.5 left ventricular assist device.Removal  of intraaortic balloon pump from the left femoral artery. Placement of pulmonary artery Swan-Ganz catheter via right IJ. ?4/6 negative fluid balance, overall decrease in pressor requirements, on SBT.  ?4/6 tolerated SBT for 2h before tiring ?4/7 successfully extubated following 2-hour SBT ? ?Subjective/overnight events:  ?Extubated today. ? ?Objective   ?Blood pressure (!) 126/92, pulse 80, temperature 99 ?F (37.2 ?C), temperature source Core, resp. rate 15, height 5\' 9"  (1.753 m), weight 81.9 kg, SpO2 94 %. ?PAP: (30-46)/(16-30) 41/20 ?CVP:  [5 mmHg-15 mmHg] 9 mmHg ?CO:  [4.8 L/min-5.3 L/min] 4.8 L/min ?CI:  [2.5 L/min/m2-2.7 L/min/m2] 2.5 L/min/m2  ?Vent Mode: PSV;CPAP ?FiO2 (%):  [40 %] 40 % ?Set Rate:  [14 bmp] 14 bmp ?Vt Set:  [490 mL-560 mL] 490 mL ?PEEP:  [5 cmH20] 5 cmH20 ?Pressure Support:  [10 cmH20] 10 cmH20 ?Plateau Pressure:  [16 cmH20-20 cmH20] 18 cmH20  ? ?Intake/Output Summary (Last 24 hours) at 07/29/2021 1114 ?Last data filed at 07/29/2021 1000 ?Gross per 24 hour  ?Intake 4582.36 ml  ?Output 6714 ml  ?Net -2131.64 ml  ? ? ?Filed Weights  ? 07/27/21 0422 07/28/21 0500 07/29/21 0444  ?Weight: 87 kg 85.7 kg 81.9 kg  ? ? ?Examination: ?General: Appears older than stated age.  Intubated on dexmedetomidine alone for sedation.  Color has improved ?HENT: ET tube OG tube in place. ?Lungs: Clear to auscultation bilaterally.  No wheezing rales or rhonchi noted. Tolerating SBT 5/5 with strong cough.  ?Cardiovascular: Tachycardic, no rub murmur gallop appreciated.  Impella in right subclavian.  No further bleeding from insertion site ?Abdomen: Soft, nontender, nondistended, no  rebound/rigidity/guarding.  Positive bowel sounds. ?Extremities: No edema of the lower extremities.  Distal pulses are weakly present x4 extremities.  Temperature is uniform across all extremities proximal and distal.  The right great toe on the dorsal surface has a 1.5 x 1.5 area of eschar.  No significant erythema.  There is no change in  pallor.  Prior groin sites are intact.  ?Neuro: Currently sedated, not following commands ?Skin: No rash ? ?POC echo shows Impella in suboptimal position abutting the MV cordae. ? ?Assessment & Plan:  ?Critically ill due to acute cardiogenic shock requiring titration of inotropes and vasopressors and management of mechanical cardiac support due to acute coronary syndrome with STEMI ?HFrEF EF estimated 10 to 15% ?Critically ill due to acute hypoxic respiratory failure requiring mechanical ventilation ?Peripheral vascular disease ?Right great toe ulceration with eschar in place ?Uncontrolled diabetes mellitus ?Hypertension ?Wide-complex tachycardia ? ?Plan: ?-Improving hemodynamics.  Currently on low-dose dobutamine and low-dose norepinephrine, latter may be largely counteracting effects of sedation. ?-Slow diuresis today, drop furosemide infusion from 10-8 as creatinine increasing slightly and CVP in single digits.  Consider further wean of infusion and transition to intermittent furosemide once extubated ?-Decreased dobutamine to 1.25 today.  Consider stopping tomorrow. ?-Continue Impella support at P8.  Gradual Impella wean starting tomorrow. ?- Trial of extubation today.  ?-Fever has now resolved.  May be a result of improving perfusion.  5 more days of antibacterial agents and complete further 10 days of micafungin. ? ?-Goal would eventually be to transition patient to guideline directed heart failure therapy as we await family to arrive from [place].  Patient does not have any good long-term salvage options apart from optimize medical therapy. ? ?Best Practice (right click and "Reselect all SmartList Selections" daily)  ? ?Diet/type: On tube feeds.  We will place core track today ?DVT prophylaxis: systemic heparin ?GI prophylaxis: PPI ?Lines: N/A ?Foley:  N/A ?Code Status:  full code ?Last date of multidisciplinary goals of care discussion [family updated at bedside.  They are aware of poor prognosis and patient's  prior health difficulties.] ? ?CRITICAL CARE ?Performed by: Kipp Brood ? ? ?Total critical care time: 40 minutes ? ?Critical care time was exclusive of separately billable procedures and treating other patients. ? ?Critical care was necessary to treat or prevent imminent or life-threatening deterioration. ? ?Critical care was time spent personally by me on the following activities: development of treatment plan with patient and/or surrogate as well as nursing, discussions with consultants, evaluation of patient's response to treatment, examination of patient, obtaining history from patient or surrogate, ordering and performing treatments and interventions, ordering and review of laboratory studies, ordering and review of radiographic studies, pulse oximetry, re-evaluation of patient's condition and participation in multidisciplinary rounds. ? ?Kipp Brood, MD FRCPC ?ICU Physician ?[place]  ?Pager: [phone] ?Mobile: [phone] ?After hours: [phone]. ? ? ? ? ?  ?

## 2021-07-30 ENCOUNTER — Inpatient Hospital Stay (HOSPITAL_COMMUNITY): Payer: No Typology Code available for payment source

## 2021-07-30 DIAGNOSIS — R570 Cardiogenic shock: Secondary | ICD-10-CM | POA: Diagnosis not present

## 2021-07-30 LAB — CBC
HCT: 24.5 % — ABNORMAL LOW (ref 39.0–52.0)
HCT: 26 % — ABNORMAL LOW (ref 39.0–52.0)
Hemoglobin: 8 g/dL — ABNORMAL LOW (ref 13.0–17.0)
Hemoglobin: 9.1 g/dL — ABNORMAL LOW (ref 13.0–17.0)
MCH: 28.6 pg (ref 26.0–34.0)
MCH: 30.2 pg (ref 26.0–34.0)
MCHC: 32.7 g/dL (ref 30.0–36.0)
MCHC: 35 g/dL (ref 30.0–36.0)
MCV: 87.5 fL (ref 80.0–100.0)
MCV: 86.4 fL (ref 80.0–100.0)
Platelets: 162 10*3/uL (ref 150–400)
Platelets: 160 10*3/uL (ref 150–400)
RBC: 2.8 MIL/uL — ABNORMAL LOW (ref 4.22–5.81)
RBC: 3.01 MIL/uL — ABNORMAL LOW (ref 4.22–5.81)
RDW: 14.3 % (ref 11.5–15.5)
RDW: 13.8 % (ref 11.5–15.5)
WBC: 16.6 10*3/uL — ABNORMAL HIGH (ref 4.0–10.5)
WBC: 19.3 10*3/uL — ABNORMAL HIGH (ref 4.0–10.5)
nRBC: 1.4 % — ABNORMAL HIGH (ref 0.0–0.2)
nRBC: 4.2 % — ABNORMAL HIGH (ref 0.0–0.2)

## 2021-07-30 LAB — POCT I-STAT 7, (LYTES, BLD GAS, ICA,H+H)
Acid-Base Excess: 0 mmol/L (ref 0.0–2.0)
Acid-base deficit: 1 mmol/L (ref 0.0–2.0)
Bicarbonate: 21.8 mmol/L (ref 20.0–28.0)
Bicarbonate: 20.1 mmol/L (ref 20.0–28.0)
Calcium, Ion: 1.22 mmol/L (ref 1.15–1.40)
Calcium, Ion: 1.2 mmol/L (ref 1.15–1.40)
HCT: 24 % — ABNORMAL LOW (ref 39.0–52.0)
HCT: 24 % — ABNORMAL LOW (ref 39.0–52.0)
Hemoglobin: 8.2 g/dL — ABNORMAL LOW (ref 13.0–17.0)
Hemoglobin: 8.2 g/dL — ABNORMAL LOW (ref 13.0–17.0)
O2 Saturation: 96 %
O2 Saturation: 93 %
Patient temperature: 38.2
Patient temperature: 38.3
Potassium: 4.3 mmol/L (ref 3.5–5.1)
Potassium: 4.4 mmol/L (ref 3.5–5.1)
Sodium: 146 mmol/L — ABNORMAL HIGH (ref 135–145)
Sodium: 145 mmol/L (ref 135–145)
TCO2: 23 mmol/L (ref 22–32)
TCO2: 21 mmol/L — ABNORMAL LOW (ref 22–32)
pCO2 arterial: 24.8 mmHg — ABNORMAL LOW (ref 32–48)
pCO2 arterial: 21.6 mmHg — ABNORMAL LOW (ref 32–48)
pH, Arterial: 7.555 — ABNORMAL HIGH (ref 7.35–7.45)
pH, Arterial: 7.58 — ABNORMAL HIGH (ref 7.35–7.45)
pO2, Arterial: 71 mmHg — ABNORMAL LOW (ref 83–108)
pO2, Arterial: 57 mmHg — ABNORMAL LOW (ref 83–108)

## 2021-07-30 LAB — COOXEMETRY PANEL
Carboxyhemoglobin: 0.8 % (ref 0.5–1.5)
Carboxyhemoglobin: 1.2 % (ref 0.5–1.5)
Carboxyhemoglobin: 1.6 % — ABNORMAL HIGH (ref 0.5–1.5)
Methemoglobin: <0.7 % (ref 0.0–1.5)
Methemoglobin: <0.7 % (ref 0.0–1.5)
Methemoglobin: <0.7 % (ref 0.0–1.5)
O2 Saturation: 37.6 %
O2 Saturation: 49.5 %
O2 Saturation: 45.5 %
Total hemoglobin: 7.4 g/dL — ABNORMAL LOW (ref 12.0–16.0)
Total hemoglobin: 7.9 g/dL — ABNORMAL LOW (ref 12.0–16.0)
Total hemoglobin: <6.9 g/dL — CL (ref 12.0–16.0)

## 2021-07-30 LAB — COMPREHENSIVE METABOLIC PANEL
ALT: 75 U/L — ABNORMAL HIGH (ref 0–44)
AST: 94 U/L — ABNORMAL HIGH (ref 15–41)
Albumin: 1.6 g/dL — ABNORMAL LOW (ref 3.5–5.0)
Alkaline Phosphatase: 105 U/L (ref 38–126)
Anion gap: 6 (ref 5–15)
BUN: 40 mg/dL — ABNORMAL HIGH (ref 6–20)
CO2: 24 mmol/L (ref 22–32)
Calcium: 8.4 mg/dL — ABNORMAL LOW (ref 8.9–10.3)
Chloride: 116 mmol/L — ABNORMAL HIGH (ref 98–111)
Creatinine, Ser: 1.46 mg/dL — ABNORMAL HIGH (ref 0.61–1.24)
GFR, Estimated: 59 mL/min — ABNORMAL LOW (ref >60)
Glucose, Bld: 173 mg/dL — ABNORMAL HIGH (ref 70–99)
Potassium: 4.4 mmol/L (ref 3.5–5.1)
Sodium: 146 mmol/L — ABNORMAL HIGH (ref 135–145)
Total Bilirubin: 0.9 mg/dL (ref 0.3–1.2)
Total Protein: 4.9 g/dL — ABNORMAL LOW (ref 6.5–8.1)

## 2021-07-30 LAB — LACTIC ACID, PLASMA
Lactic Acid, Venous: 1.3 mmol/L (ref 0.5–1.9)
Lactic Acid, Venous: 1.3 mmol/L (ref 0.5–1.9)

## 2021-07-30 LAB — GLUCOSE, CAPILLARY
Glucose-Capillary: 117 mg/dL — ABNORMAL HIGH (ref 70–99)
Glucose-Capillary: 135 mg/dL — ABNORMAL HIGH (ref 70–99)
Glucose-Capillary: 137 mg/dL — ABNORMAL HIGH (ref 70–99)
Glucose-Capillary: 164 mg/dL — ABNORMAL HIGH (ref 70–99)
Glucose-Capillary: 166 mg/dL — ABNORMAL HIGH (ref 70–99)
Glucose-Capillary: 167 mg/dL — ABNORMAL HIGH (ref 70–99)
Glucose-Capillary: 168 mg/dL — ABNORMAL HIGH (ref 70–99)
Glucose-Capillary: 176 mg/dL — ABNORMAL HIGH (ref 70–99)
Glucose-Capillary: 186 mg/dL — ABNORMAL HIGH (ref 70–99)

## 2021-07-30 LAB — LACTATE DEHYDROGENASE: LDH: 548 U/L — ABNORMAL HIGH (ref 98–192)

## 2021-07-30 LAB — HEPARIN LEVEL (UNFRACTIONATED): Heparin Unfractionated: <0.1 IU/mL — ABNORMAL LOW (ref 0.30–0.70)

## 2021-07-30 LAB — PREPARE RBC (CROSSMATCH)

## 2021-07-30 MED ORDER — NOREPINEPHRINE 16 MG/250ML-% IV SOLN
0.0000 ug/min | INTRAVENOUS | Status: DC
  Administered 2021-07-30: 2 ug/min via INTRAVENOUS
  Filled 2021-07-30 (×2): qty 250

## 2021-07-30 MED ORDER — IPRATROPIUM-ALBUTEROL 0.5-2.5 (3) MG/3ML IN SOLN
RESPIRATORY_TRACT | Status: AC
  Filled 2021-07-30: qty 3

## 2021-07-30 MED ORDER — SODIUM CHLORIDE 0.9% IV SOLUTION: Freq: Once | INTRAVENOUS | Status: AC

## 2021-07-30 MED ORDER — MIDAZOLAM-SODIUM CHLORIDE 100-0.9 MG/100ML-% IV SOLN
5.0000 mg/h | INTRAVENOUS | Status: DC
  Administered 2021-07-30: 5 mg/h via INTRAVENOUS
  Filled 2021-07-30: qty 100

## 2021-07-30 MED ORDER — HYDROMORPHONE HCL 1 MG/ML IJ SOLN
2.0000 mg | INTRAMUSCULAR | Status: DC | PRN
  Filled 2021-07-30: qty 4

## 2021-07-30 MED ORDER — SCOPOLAMINE 1 MG/3DAYS TD PT72
1.0000 | MEDICATED_PATCH | TRANSDERMAL | Status: DC
  Administered 2021-07-30: 1.5 mg via TRANSDERMAL
  Filled 2021-07-30: qty 1

## 2021-07-30 MED ORDER — SODIUM CHLORIDE 0.9 % IV SOLN
2.0000 mg/h | INTRAVENOUS | Status: DC
  Administered 2021-07-30: 4 mg/h via INTRAVENOUS
  Administered 2021-07-30: 2 mg/h via INTRAVENOUS
  Filled 2021-07-30 (×3): qty 2.5

## 2021-07-30 MED ORDER — MIDAZOLAM BOLUS VIA INFUSION
2.0000 mg | Freq: Once | INTRAVENOUS | Status: AC
  Administered 2021-07-30: 2 mg via INTRAVENOUS
  Filled 2021-07-30: qty 2

## 2021-07-30 MED ORDER — HYDROMORPHONE HCL 1 MG/ML IJ SOLN
INTRAMUSCULAR | Status: AC
  Administered 2021-07-30: 1 mg via INTRAVENOUS
  Filled 2021-07-30: qty 1

## 2021-07-30 MED ORDER — SODIUM BICARBONATE 8.4 % IV SOLN
INTRAVENOUS | Status: DC
  Filled 2021-07-30: qty 25

## 2021-07-30 MED ORDER — HYDROMORPHONE BOLUS VIA INFUSION
2.0000 mg | Freq: Once | INTRAVENOUS | Status: AC
  Administered 2021-07-30: 2 mg via INTRAVENOUS
  Filled 2021-07-30: qty 2

## 2021-07-31 LAB — BPAM RBC
Blood Product Expiration Date: 202305032359
Blood Product Expiration Date: 202305032359
Blood Product Expiration Date: 202305032359
ISSUE DATE / TIME: 202304080100
ISSUE DATE / TIME: 202304080652
ISSUE DATE / TIME: 202304081101
Unit Type and Rh: 6200
Unit Type and Rh: 6200
Unit Type and Rh: 6200

## 2021-07-31 LAB — TYPE AND SCREEN
ABO/RH(D): A POS
Antibody Screen: NEGATIVE
Unit division: 0
Unit division: 0
Unit division: 0

## 2021-08-01 LAB — AEROBIC CULTURE W GRAM STAIN (SUPERFICIAL SPECIMEN)
Culture: NO GROWTH
Gram Stain: NONE SEEN

## 2021-08-01 LAB — PATHOLOGIST SMEAR REVIEW

## 2021-08-22 NOTE — Progress Notes (Signed)
?  Patient remains unresponsive with continuous Cheyne-Stokes respirations. ? ?Family at bedside. I also d/w patient;s daughter by phone. ? ?Will start comfort gtts and discontinue Impella support.  ? ?D/w Dr. Laneta Simmers as well.  ? ?Additional CCT 45 minutes ? ?Arvilla Meres, MD  ?2:55 PM ? ?

## 2021-08-22 NOTE — Progress Notes (Signed)
Patient deceased at 66. Family and friends present at bedside. Confirmed via auscultation by Dr. Haroldine Laws and this RN. Pt belongings returned to sister Trudee Grip. ?

## 2021-08-22 NOTE — Progress Notes (Addendum)
Patient ID: [name] [name], male   DOB: [birth year], 51 y.o.   MRN: [id number] ?  ? ? Advanced Heart Failure Rounding Note ? ?PCP-Cardiologist: None  ? ?Subjective:   ?4/2 LHC : Occluded ostial LAD (culprit), occluded ramus with collaterals, 90% distal LCx prior to left PDA.  PCI to ostial LAD with DES.  ?4/3 IABP removed. S/P Impella 5.5. Diuresed with IV lasix. NSVT with amio bolus.  ?4/4 Diuresed with IV lasix.  Remains + . Weight trending up. Milrinone weaned off. Impella increased to P8.  ?4/5 Given 1UPRBC. Started on lasix drip.  ? ?Has had eventful 24 hours. NE stopped yesterday and DBA weaned to 1.25 by CCM. Lasix gtt stopped. Developed bleeding at Impella site. Co-ox dropped. Given IVF and RBCs. Back on NE and DBA 2.5  ? ?Currently unresponsive with Cheyne-stokes breathing. Still draining from Impella site ? ?Impella P-7, Flow 4.0, CI 2.5, Co-ox 50%.  Minimal pulsativity ? ?Hgb 8.0 ? ?LDH [phone] ?Creatinine 1.46 ? ?Swan Numbers  ?PAP: (14-24)/(4-12) 24/4 ?CVP:  [2 mmHg-7 mmHg] 5 mmHg ?CO:  [4 L/min-5.2 L/min] 4.5 L/min ?CI:  [2 L/min/m2-2.6 L/min/m2] 2.3 L/min/m2 ? ? ? ?Objective:   ?Weight Range: ?81.9 kg ?Body mass index is 26.66 kg/m?.  ? ?Vital Signs:   ?Temp:  [99 ?F (37.2 ?C)-100.6 ?F (38.1 ?C)] 100.4 ?F (38 ?C) (04/08 0730) ?Pulse Rate:  [80-128] 128 (04/08 0645) ?Resp:  [13-32] 32 (04/08 0730) ?SpO2:  [94 %-98 %] 96 % (04/08 0645) ?Arterial Line BP: (61-104)/(56-83) 72/64 (04/08 0730) ?Last BM Date : 07/24/2021 ? ?Weight change: ?Filed Weights  ? 07/27/21 0422 07/28/21 0500 07/29/21 0444  ?Weight: 87 kg 85.7 kg 81.9 kg  ? ? ?Intake/Output:  ? ?Intake/Output Summary (Last 24 hours) at August 13, 2021 0847 ?Last data filed at 13-Aug-2021 0800 ?Gross per 24 hour  ?Intake 3984.52 ml  ?Output 6170 ml  ?Net -2185.48 ml  ?  ? ? ?Physical Exam  ? ?General:  Lying in bed  Ill-appearing. Eyes open but unresponsive +Cheyne stokes respiration ?HEENT: normal ?Neck: supple. no JVD.  ?Cor: PMI nondisplaced.  Regular tachy No rubs, gallops or murmurs. Impella site with drainage to suction ?Lungs: coarse ?Abdomen: soft, nontender, nondistended. No hepatosplenomegaly. No bruits or masses. Good bowel sounds. ?Extremities: no cyanosis, clubbing, rash, edema  cool  R great toe with eschar ?Neuro: awake but not responding to pain or commands ? ? ?Telemetry  ? ?Sinus 120s Personally reviewed ? ? ?Labs  ?  ?CBC ?Recent Labs  ?  07/29/21 ?1959 07/29/21 ?2250 August 13, 2021 ?0253  ?WBC 17.3*  --  16.6*  ?HGB 8.7* 7.8* 8.0*  ?HCT 26.7* 23.9* 24.5*  ?MCV 88.4  --  87.5  ?PLT 169  --  162  ? ?Basic Metabolic Panel ?Recent Labs  ?  07/28/21 ?0401 07/28/21 ?0407 07/29/21 ?1655 August 13, 2021 ?[id number]  ?NA 138   < > 142 146*  ?K 3.0*   < > 3.5 4.4  ?CL 106   < > 108 116*  ?CO2 24   < > 25 24  ?GLUCOSE 197*   < > 225* 173*  ?BUN 21*   < > 35* 40*  ?CREATININE 1.38*   < > 1.48* 1.46*  ?CALCIUM 7.6*   < > 8.8* 8.4*  ?MG 1.9  --   --   --   ? < > = values in this interval not displayed.  ? ?Liver Function Tests ?Recent Labs  ?  07/29/21 ?0315 August 13, 2021 ?0253  ?AST 94*  94*  ?ALT 72* 75*  ?ALKPHOS 129* 105  ?BILITOT 1.0 0.9  ?PROT 6.3* 4.9*  ?ALBUMIN 1.9* 1.6*  ? ?No results for input(s): LIPASE, AMYLASE in the last 72 hours. ?Cardiac Enzymes ?No results for input(s): CKTOTAL, CKMB, CKMBINDEX, TROPONINI in the last 72 hours. ? ?BNP: ?BNP (last 3 results) ?Recent Labs  ?  08/04/2021 ?0017  ?BNP 431.0*  ? ? ?ProBNP (last 3 results) ?No results for input(s): PROBNP in the last 8760 hours. ? ? ?D-Dimer ?No results for input(s): DDIMER in the last 72 hours. ? ?Hemoglobin A1C ?No results for input(s): HGBA1C in the last 72 hours. ? ?Fasting Lipid Panel ?No results for input(s): CHOL, HDL, LDLCALC, TRIG, CHOLHDL, LDLDIRECT in the last 72 hours. ? ?Thyroid Function Tests ?No results for input(s): TSH, T4TOTAL, T3FREE, THYROIDAB in the last 72 hours. ? ?Invalid input(s): FREET3 ? ?Other results: ? ? ?Imaging  ? ? ?DG Chest Port 1 View ? ?Result Date: August 17, 2021 ?CLINICAL  DATA:  Left ventricular assist device EXAM: PORTABLE CHEST 1 VIEW COMPARISON:  Prior chest x-ray yesterday FINDINGS: Interval extubation. Feeding tube remains present. The tip is off the field of view, presumably within the stomach. Right IJ vascular sheath conveys a Swan-Ganz catheter into the heart. The tip of the Swan overlies the right lower lobe pulmonary artery. In implantable loop recorder projects over the left chest. An Impella device is present via a right subclavian approach. The device appears well positioned across the mitral valve. Left upper extremity PICC. Catheter tip in good position at the superior cavoatrial junction. Improved bibasilar atelectasis. Lung volumes remain low. No pneumothorax. No acute osseous abnormality. IMPRESSION: 1. Interval extubation. 2. The tip of the Swan-Ganz catheter overlies the right lower lobe pulmonary artery. 3. Otherwise, stable and satisfactory support apparatus. 4. Improved bibasilar atelectasis. Electronically Signed   By: Jacqulynn Cadet M.D.   On: August 17, 2021 07:25   ? ? ?Medications:   ? ? ?Scheduled Medications: ? aspirin  81 mg Per NG tube Daily  ? atorvastatin  80 mg Per NG tube Daily  ? buPROPion  75 mg Per Tube BID  ? chlorhexidine  15 mL Mouth Rinse BID  ? Chlorhexidine Gluconate Cloth  6 each Topical Daily  ? digoxin  0.125 mg Per Tube Daily  ? docusate  100 mg Per Tube BID  ? feeding supplement (PROSource TF)  45 mL Per Tube QID  ? folic acid  1 mg Per Tube Daily  ? mouth rinse  15 mL Mouth Rinse q12n4p  ? multivitamin with minerals  1 tablet Per Tube Daily  ? mupirocin ointment  1 application. Nasal BID  ? pantoprazole sodium  40 mg Per Tube Daily  ? polyethylene glycol  17 g Per Tube Daily  ? QUEtiapine  25 mg Per Tube BID  ? sennosides  5 mL Per Tube BID  ? sodium chloride flush  10-40 mL Intracatheter Q12H  ? sodium chloride flush  3 mL Intravenous Q12H  ? ticagrelor  90 mg Per Tube BID  ? ? ?Infusions: ? sodium chloride    ? sodium chloride  Stopped (07/29/21 2350)  ? sodium chloride    ? amiodarone 30 mg/hr (08-17-2021 0700)  ? aztreonam 1 g (Aug 17, 2021 0809)  ? dexmedetomidine (PRECEDEX) IV infusion 0.3 mcg/kg/hr (2021-08-17 0700)  ? DOBUTamine 2.5 mcg/kg/min (2021/08/17 0809)  ? feeding supplement (VITAL 1.5 CAL) 60 mL/hr at 17-Aug-2021 0400  ? fentaNYL infusion INTRAVENOUS Stopped (07/28/21 0734)  ? insulin 15 Units/hr (08/17/21  0700)  ? micafungin [facility]) IV Stopped (07/29/21 1716)  ? norepinephrine (LEVOPHED) Adult infusion 2 mcg/min (08-19-21 [id number])  ? sodium bicarbonate 25 mEq (Impella PURGE) in dextrose 5 % 1000 mL bag    ? vancomycin Stopped (08-19-21 0150)  ? ? ?PRN Medications: ?sodium chloride, sodium chloride, sodium chloride, sodium chloride, acetaminophen (TYLENOL) oral liquid 160 mg/5 mL, alum & mag hydroxide-simeth, dextrose, fentaNYL, midazolam, ondansetron (ZOFRAN) IV, simethicone, sodium chloride flush, sodium chloride flush ? ?Assessment/Plan  ? ?1. CAD: History of anterior MI 10/21 with PTCA to distal LAD.  Poorly controlled diabetes.  Not sure he has been taking statin given elevated LDL.  This admission with delayed presentation anterior MI, chest pain began 3 days prior to admission. However, TnI jumped from 2234 => 18,731.  Patient now s/p coronary angiography on 4/2 with DES to ostial LAD (occluded, culprit).  Also with occluded ramus with collaterals and 90% distal LCX.  IABP removed with Impella 5.5 placed.  ?Continue ASA, Atorvastatin 80 mg daily, ticagrelor.   ?2. Acute systolic CHF/cardiogenic shock: Echo in 10/21 with EF 65%.  Echo 4/2 with EF <20% with apical and peri-apical akinesis, no LV thrombus, normal RV size and systolic function, normal  ?IABP removed 4/3. Impella 5.5 placed. Currently at P8 with flow 4.5. NE stopped and DBA cut back on 4/7 by CCM. Concurrently developed bleeding at Impella site. Now back on NE and DBA 2.5. Co-ox remains low at 50%. Volume status low. Heparin being held ?- Continue NE and DBA  for  hemodynamic support ?- He is not a durable LVAD candidate given mobility issues (right-sided weakness, in wheelchair at home) and poor diabetes control with diabetic ulcer.  ?- spiro off due to low BP  ?- contin

## 2021-08-22 NOTE — Progress Notes (Signed)
ANTICOAGULATION CONSULT NOTE  ? ?Pharmacy Consult for heparin ?Indication: Impella 5.5  ? ?Allergies  ?Allergen Reactions  ? Penicillins Other (See Comments) and Anaphylaxis  ?  Has patient had a PCN reaction causing immediate rash, facial/tongue/throat swelling, SOB or lightheadedness with hypotension: Yes ?Has patient had a PCN reaction causing severe rash involving mucus membranes or skin necrosis: No ?Has patient had a PCN reaction that required hospitalization: No ?Has patient had a PCN reaction occurring within the last 10 years: No ?If all of the above answers are "NO", then may proceed with Cephalosporin use. ?  ? Empagliflozin Other (See Comments)  ?  Urinary tract infectious disease  ? Liraglutide Nausea And Vomiting  ? ? ?Patient Measurements: ?Height: 5\' 9"  (175.3 cm) ?Weight: 81.9 kg (180 lb 8.9 oz) ?IBW/kg (Calculated) : 70.7 ?Heparin Dosing Weight: TBW ? ?Vital Signs: ?Temp: 100.4 ?F (38 ?C) (04/08 0730) ?Temp Source: Core (04/08 0800) ?Pulse Rate: 113 (04/08 0800) ? ?Labs: ?Recent Labs  ?  07/28/21 ?0401 07/28/21 ?0407 07/29/21 ?0315 07/29/21 ?1410 07/29/21 ?1655 07/29/21 ?1959 07/29/21 ?2250 08/14/2021 ?0253  ?HGB 9.1*   < > 9.2* 9.4*  --  8.7* 7.8* 8.0*  ?HCT 27.2*   < > 28.4* 29.1*  --  26.7* 23.9* 24.5*  ?PLT 124*  --  127* 156  --  169  --  162  ?HEPARINUNFRC <0.10*  --  0.22*  --   --   --   --  <0.10*  ?CREATININE 1.38*   < > 1.44*  --  1.48*  --   --  1.46*  ? < > = values in this interval not displayed.  ? ? ? ?Estimated Creatinine Clearance: 61.2 mL/min (A) (by C-G formula based on SCr of 1.46 mg/dL (H)). ? ? ?Assessment: ?28 yoM admitted with ACS and cardiogenic shock. Pt s/p LHC with DES and IABP placement 4/2. IABP upgraded to Impella 5.5 4/3. ? ?Heparin infusing through purge at ~11.8 ml/h (590 units/h) and systemic heparin drip 400 uts/hr.  ?Total heparin infusion 990 uts/hr  ?Heparin level at goal yesterday - bleeding overnight from JP drain > hold systemic heparin, change purge to  sodium bicarb  ?Purge pressure stable 400-500  ?Hgb low  8s   > PRBC  ?LDH trending down 1300>500  ?Transitioned cangrelor infusion to ticagrelor 4/4 ? ? ?Goal of Therapy:  ?Heparin level 0.2-0.5 units/ml  ?Monitor platelets by anticoagulation protocol: Yes ?  ?Plan:  ?Stop systemic heparin ?Change heparin purge solution to sodium bicarb purge  ?Monitor s/s bleeding and thrombosis ? ? ?Bonnita Nasuti Pharm.D. CPP, BCPS ?Clinical Pharmacist ?[phone] ?2021/08/14 8:58 AM  ? ?Please check AMION for all [place] numbers ?08-14-2021 ? ? ? ?

## 2021-08-22 NOTE — Progress Notes (Signed)
?   07/31/2021 1240  ?Clinical Encounter Type  ?Visited With Patient and family together;Health care provider  ?Visit Type Initial;Critical Care;Patient actively dying  ?Referral From Nurse;Family  ?Spiritual Encounters  ?Spiritual Needs Grief support;Prayer  ? ?Chaplain responded to a request from the family and RN for a visit. Patient is transitioning to comfort care, and has been very religious. Chaplain offered prayer and support. Chaplain introduced spiritual care services. Spiritual care services available as needed.  ? ?Alda Ponder, Chaplain ? ?

## 2021-08-22 NOTE — Progress Notes (Signed)
? ?NAME:  [name] [name], MRN:  [id number], DOB:  06-26-1970, LOS: 7 ?ADMISSION DATE:  08/08/2021, CONSULTATION DATE: 08/12/2021 ?REFERRING MD: Dr. Maryellen Pile, CHIEF COMPLAINT: Chest pain ? ?History of Present Illness:  ?This is a 51 year old white male who presented to the emergency room with new onset chest pain.  Patient states that he had substernal chest pain/pressure radiating to the left side of his chest.  He denies radiculopathy into the arm or into the jaw.  Prior to coming to the hospital he was short of breath and diaphoretic with chest pain.  He denies any nausea or vomiting.  Patient being seen at [facility] emergency room 2 days ago for a diabetic foot ulcer on the right foot.  He was sent home at that time on doxycycline.  His blood sugars generally out-of-control with a last hemoglobin A1c of 11.  Currently the patient states that he is a 1 out of 10 chest pressure.  Patient has a history of myocardial infarction with angioplasty in 2021.  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 ? ?Pertinent  Medical History  ?Uncontrolled diabetes mellitus type 2 ?Right great toe foot ulcer ?Myocardial infarction ?CVA ?Hypertension ? ?Significant Hospital Events: ?Including procedures, antibiotic start and stop dates in addition to other pertinent events   ?4/2 admitted with chest pain.  Suspect missed STEMI ?4/2 cardiac catheterization shows triple-vessel disease with total occlusion of an ostial LAD lesion with successful stent placement.  Continued occlusion of the distal LAD.  Elevated LVEDP at 24 mmHg ?4/2 insertion of intra-aortic balloon pump ?4/2 Foley catheter placed by urology.  Patient has prior stricture ?4/2 echocardiogram shows EF less than 20% with severely decreased LV function with global hypokinesis.  Inferolateral segment moves best. ?4/2 intubated for respiratory distress. ?4/3 Placement of Impella 5.5 left ventricular assist device.Removal  of intraaortic balloon pump from the left femoral artery. Placement of pulmonary artery Swan-Ganz catheter via right IJ. ?4/6 negative fluid balance, overall decrease in pressor requirements, on SBT.  ?4/6 tolerated SBT for 2h before tiring ?4/7 successfully extubated following 2-hour SBT ? ? ?Subjective/overnight events:  ?Extubated yesterday, Draining copiously from Impella insertion site. Hypotension overnight responsive to fluids. Furosemide infusion stopped.  ? ?Objective   ?Blood pressure (!) 126/92, pulse (!) 113, temperature (!) 100.4 ?F (38 ?C), temperature source Core, resp. rate (!) 32, height 5\' 9"  (1.753 m), weight 81.9 kg, SpO2 96 %. ?PAP: (14-24)/(4-12) 24/4 ?CVP:  [2 mmHg-7 mmHg] 4 mmHg ?CO:  [4 L/min-5.2 L/min] 4.5 L/min ?CI:  [2 L/min/m2-2.6 L/min/m2] 2.3 L/min/m2  ?   ? ?Intake/Output Summary (Last 24 hours) at 08-26-21 0902 ?Last data filed at Aug 26, 2021 0800 ?Gross per 24 hour  ?Intake 3655.34 ml  ?Output 5870 ml  ?Net -2214.66 ml  ? ? ?Filed Weights  ? 07/27/21 0422 07/28/21 0500 07/29/21 0444  ?Weight: 87 kg 85.7 kg 81.9 kg  ? ? ?Examination: ?General: Appears older than stated age. Extubated ? HENT: Cortrak feeding tube in place.  ?Lungs: Cheyne-Stokes breathing. Chest clear  ?Cardiovascular: Tachycardic, no rub murmur gallop appreciated.  Impella in right subclavian.  No further bleeding from insertion site - JP still draining. No pulsatility on P7 ?Abdomen: Soft, nontender, nondistended, no rebound/rigidity/guarding.  Positive bowel sounds. ?Extremities: No edema of the lower extremities.  Distal pulses are weakly present x4 extremities.  Temperature is uniform across all extremities proximal and distal.  The right great toe on the dorsal surface  has a 1.5 x 1.5 area of eschar.  No significant erythema.  There is no change in pallor.  Prior groin sites are intact.  ?Neuro: Currently sedated, not following commands ?Skin: No rash ? ?POC echo shows Impella in suboptimal position abutting the MV  cordae. ? ?Assessment & Plan:  ?Critically ill due to acute cardiogenic shock requiring titration of inotropes and vasopressors and management of mechanical cardiac support due to acute coronary syndrome with STEMI ?HFrEF EF estimated 10 to 15% ?Critically ill due to acute hypoxic respiratory failure requiring mechanical ventilation ?Peripheral vascular disease ?Right great toe ulceration with eschar in place ?Uncontrolled diabetes mellitus ?Hypertension ?Wide-complex tachycardia ? ?Plan: ?- Maintain O2 saturation post extubation but remains weak with poor cough. Cheyne-Stokes respiration from prior stroke, HF gives the appearance of respiratory distress. ?- Of greater concern is the apparent lack of LV recovery despite adequate diuresis and period of ' ventricular rest '. There are no other options for long-term support and at this point gradual wean of Impella, with possible transition to comfort care likely the most reasonable option.  Will discuss with family.  ? ? ?Best Practice (right click and "Reselect all SmartList Selections" daily)  ? ?Diet/type: On tube feeds.  We will place core track today ?DVT prophylaxis: systemic heparin ?GI prophylaxis: PPI ?Lines: N/A ?Foley:  N/A ?Code Status:  full code ?Last date of multidisciplinary goals of care discussion [family updated at bedside.  They are aware of poor prognosis and patient's prior health difficulties.] ? ?CRITICAL CARE ?Performed by: Kipp Brood ? ? ?Total critical care time: 40 minutes ? ?Critical care time was exclusive of separately billable procedures and treating other patients. ? ?Critical care was necessary to treat or prevent imminent or life-threatening deterioration. ? ?Critical care was time spent personally by me on the following activities: development of treatment plan with patient and/or surrogate as well as nursing, discussions with consultants, evaluation of patient's response to treatment, examination of patient, obtaining history  from patient or surrogate, ordering and performing treatments and interventions, ordering and review of laboratory studies, ordering and review of radiographic studies, pulse oximetry, re-evaluation of patient's condition and participation in multidisciplinary rounds. ? ?Kipp Brood, MD FRCPC ?ICU Physician ?[place]  ?Pager: [phone] ?Mobile: [phone] ?After hours: [phone]. ? ? ? ? ?  ?

## 2021-08-22 NOTE — Progress Notes (Signed)
Date and time results received: 08/06/2021 1020 ?(use smartphrase ".now" to insert current time) ? ?Test: Hgb  ?Critical Value: 6.9 ? ?Name of Provider Notified: Bensimhon ? ?Orders Received? Or Actions Taken?: Orders Received - See Orders for details and Actions Taken: 1 RBC ordered. Will continue to monitor.  ?

## 2021-08-22 NOTE — Discharge Summary (Addendum)
?Advanced Heart Failure Death Summary ? ?Death Summary  ? ?Patient ID: [name] [name] ?MRN: [id number], DOB/AGE: [birth year] 51 y.o. Admit date: 08/05/2021 ?D/C date:    2021-08-13 Time of Death 49   ? ?Primary Discharge Diagnoses:  ?1.CAD with acute anterior MI ?2. Acute HFrEF /cardiogenic shock ?3. AKI ?4. Diabetes: Poor control, hgbA1c 11.8.   ?5. H/O CVA: Residual right-sided weakness and is wheelchair-bound at baseline.  ?6. Diabetic foot ulcer:  ?7. Ventricular tachycardia ?8. Sepsis ?9. Elevated LFTs: Suspect due to cardiogenic shock ?10. Acute hypoxemic respiratory failure:  ?11. NSVT:  On amio drip. Keep K> 4.0 Mg >2.0 ?12. Acute blood loss anemia ? ?Hospital Course:  ?[name] [name] was a  51 year old with history of uncontrolled diabetes, CAV with r sided weakness, and CAD. Had previous MI with occluded distal LAD, treated with PTCA.  Echo that time showed EF 65% with apical akinesis and an apical thrombus.   ? ?Presented to [facility] with chest pain that started 3 days prior to admit. There was concern for sepsis from the diabetic foot ulcer and aztreonam/vancomycin was started.  PCT, howevever, was only 0.31. Developed WCT and EKG with  ST changes concerning for acute MI. HS Trop  2234 => 18,731. Placed on NTG drip and Milrinone. CCM intubated prior to cath. Taken for emeergent cath on 4/2 DES to ostial LAD (occluded, culprit).  Also with occluded ramus with collaterals and 90% distal LCX.  IABP placed at that time in addition to multiple pressors. Persistently low cardiac index with worsening renal function. CT surgery consulted for additional mechanical support. On 4/3 IABP removed and upgraded to Impella 5.5. Improved LDH/creatinine with Impella support. Gradually pressors weaned to low dose dobutamine.  Diuresed with IV lasix.  ? ?Developed fever. Felt to have sepsis/distributive component from diabetic foot ulcer.  Placed on broad spectrum antibiotics. Blood cultures had no growth.  WBC peaked at 17.3.  ? ?Extubated  on 07/29/21. JP pocket drainage increased after extubation.   Worsened throughout the day with 600 cc bloody drainage. Hgb started trending down. Given IV fluids + blood. Developed hypotension and recurrent shock with mixed venous saturation down to 50%. Pressors increased.  ? ?On 2022-08-14 unresponsive and Cheyne-Stoke breathing. Minimal to no pulsativity on Impella. Situation also c/b by bleeding at Impella site. Dr Haroldine Laws discussed with family that there were no additional options. Family elected transition to comfort drips and discontinuation of impella. [name] [name] passed 08/13/21 at 1505.  ? ?See progress notes for additional details.  ?  ?Echo 07/23/2021  ? 1. Left ventricular ejection fraction, by estimation, is <20%. The left  ?ventricle has severely decreased function. The left ventricle demonstrates  ?global hypokinesis. Left ventricular diastolic parameters are  ?indeterminate. No LV thrombus. On the basal  ?inferolateral segment is moving.  ? 2. The aortic valve was not well visualized. Aortic valve regurgitation  ?is not visualized. No aortic stenosis is present.  ? 3. Right ventricular systolic function is low normal. The right  ?ventricular size is normal.  ? 4. The mitral valve is normal in structure. Mild mitral valve  ?regurgitation. No evidence of mitral stenosis.  ? 5. The inferior vena cava is normal in size with greater than 50%  ?respiratory variability, suggesting right atrial pressure of 3 mmHg.  ?Comparison(s): LV function is significantly worse.  ? ?RHC/LHC 08/08/2021  ?Total occlusion ostial LAD treated with PCI and stent implantation with 0% residual stenosis and a 12 mm Onyx stent dilated to  3.5 mm in diameter at high pressure.  TIMI grade 0 to TIMI grade III flow noted. ?Widely patent left main; total occlusion ostial ramus which receives left to left collaterals; 90% distal circumflex beyond the third obtuse marginal prior to the origin of the PDA; and nondominant right coronary. ?LVEDP 23 mmHg ?Mean  PA pressure 17 mmHg with mean wedge pressure 10 mmHg. ?Cardiac index 1.71 L/min/m?; cardiac output 3.38 L/min; mixed venous O2 saturation from pulmonary artery 58%; aortic O2 saturation 98%.  ?Acute left heart failure with cardiogenic shock based upon cardiac index although blood pressure is greater than 90 mmHg  ?  ? ?Consultations  ?CCM  ?CT Surgery  ?Urology  ? ? ?Duration of Discharge Encounter: Greater than 35 minutes  ? ?Signed, ?Darrick Grinder, NP ?08/04/2021, 2:23 PM ? ?Agree with above. ? ?Glori Bickers, MD  ?6:18 PM ? ? ? ?

## 2021-08-22 NOTE — Progress Notes (Addendum)
HB referral # [id number] ? ?HB requested that they be called back if patient goes back on the ventilator. ?

## 2021-08-22 NOTE — Progress Notes (Signed)
Wasted 25 cc Dilaudid in stericycle witnessed by Elayne Guerin RN  ?

## 2021-08-22 NOTE — Progress Notes (Addendum)
Hb (7.8) at 22:50 down from (8.7) the day prior. Fluid responsive to 250 cc LR prior to blood, MAPs into the mid 70s from low 60s. I don't think Hb was dilutional based off small bolus. Coox dropping however (50.9 from 65). This was also in the setting of levophed stopped 04/07 and dobutamine dropped from 2.5 mcg to 1.25 mcg at 0900 (04/07). 1 unit PRBCs transfused and repeat CBC without appropriate response (8.0 from 7.8). Lactate normal (1.3). over 1.5 L JP drain output over shift. Became mix of bright and dark red blood later in morning from what was predominately darker blood. Currently on P8 with MAPs back in low 60s and CVP 2-4 that has been zero'd. Coox persistently low (49.5, initial 37.6 but thought to be outlier). Hb on coox 7.9. Will transfuse 1 additional unit. Concerned about change in color and continuous JP drain output with significant volume. I think he may have small arterial bleed within pocket. Exam without hematoma now. Patient appears pale. Dobutamine increased back to 2.5 mcg. Cortrak feeds stopped ~2 hours ago. Repeat CBC/co-ox/lactate for 10:00 ordered. Continue ASA/ticag with PCI on 04/02. Heparin through impella purge but no systemic heparin.  ?

## 2021-08-22 DEATH — deceased

## 2022-08-18 IMAGING — DX DG CHEST 1V PORT
1 series · 1 of 1 positions shown · non-contrast
Comparison: Prior chest x-ray yesterday

CLINICAL DATA: Left ventricular assist device

EXAM:
PORTABLE CHEST 1 VIEW

[chest]
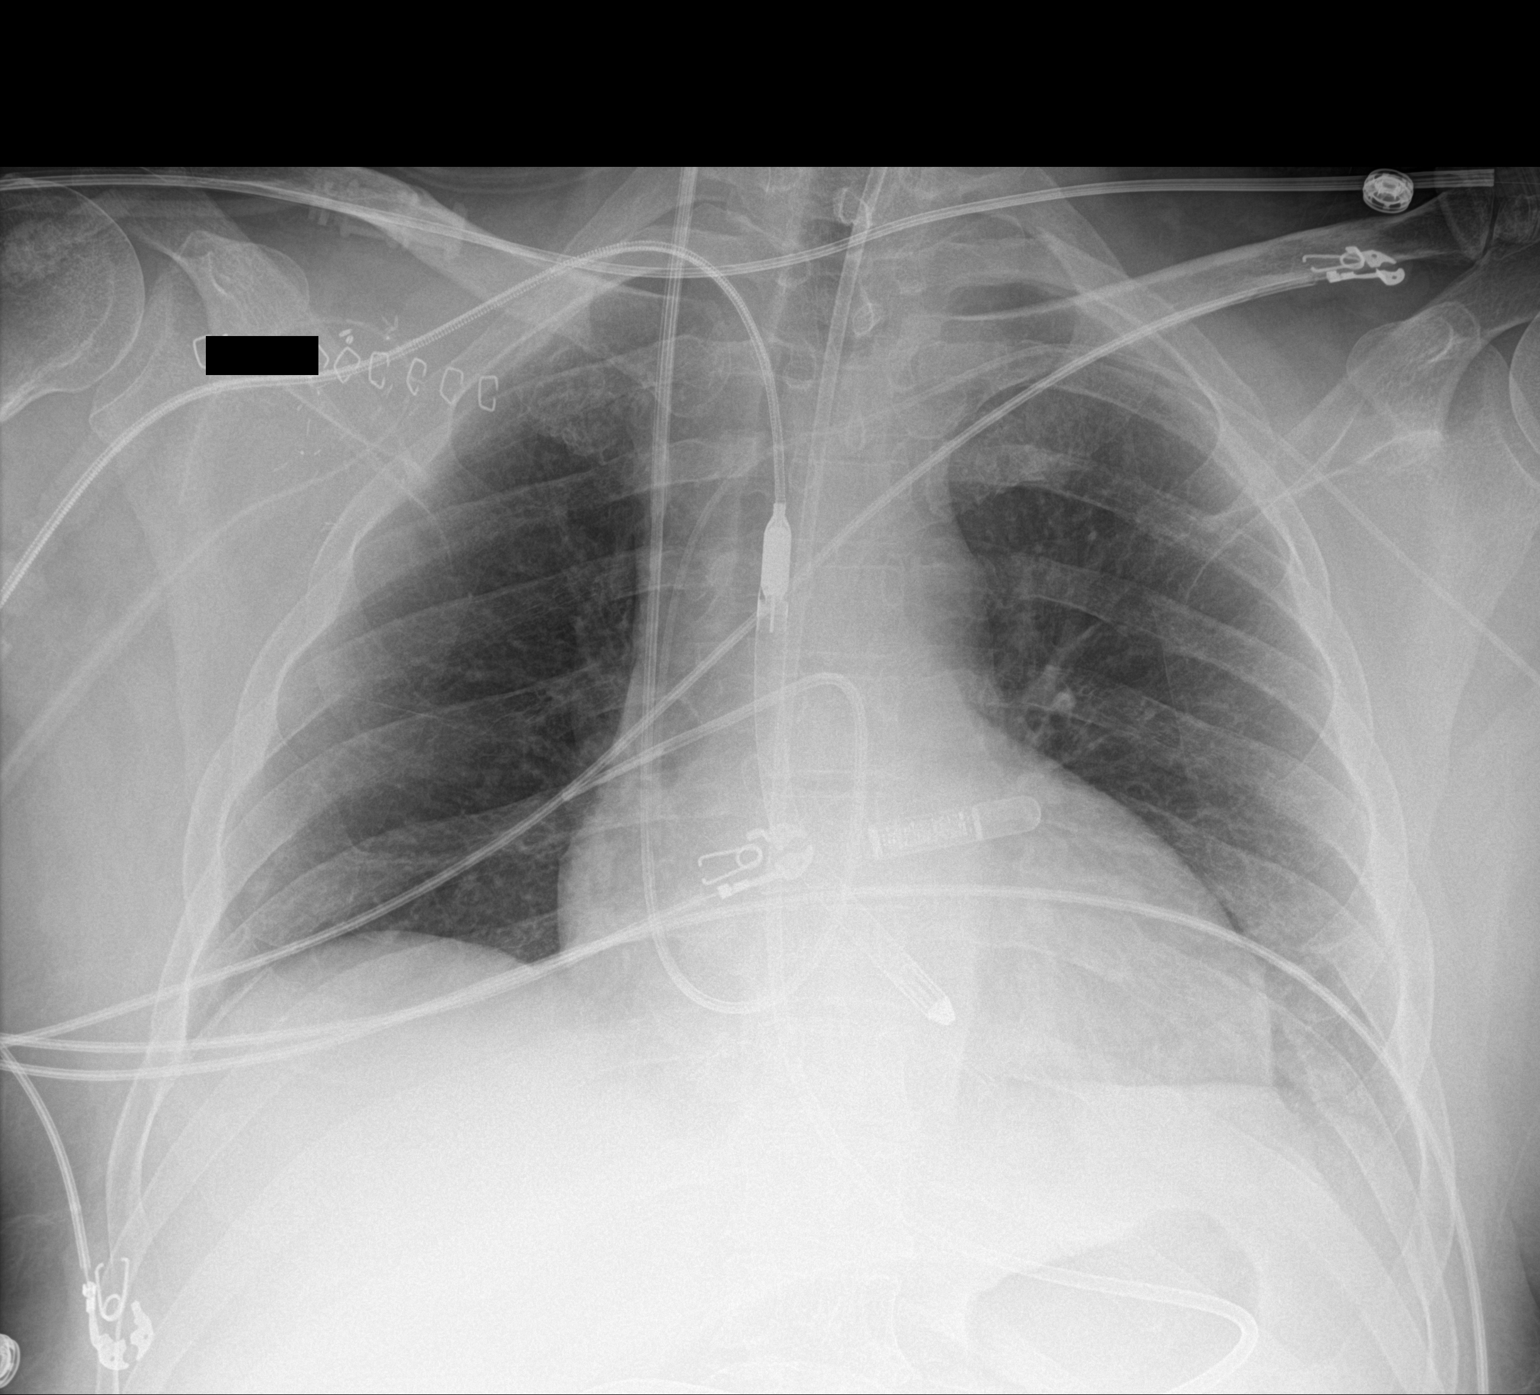

[1 of 1 positions shown; findings below may reference images not displayed]

FINDINGS: Interval extubation. Feeding tube remains present. The tip is off
the field of view, presumably within the stomach. Right IJ vascular
sheath conveys a Swan-Ganz catheter into the heart. The tip of the
[place] overlies the right lower lobe pulmonary artery. In implantable
loop recorder projects over the left chest. An Impella device is
present via a right subclavian approach. The device appears well
positioned across the mitral valve. Left upper extremity PICC.
Catheter tip in good position at the superior cavoatrial junction.

Improved bibasilar atelectasis. Lung volumes remain low. No
pneumothorax. No acute osseous abnormality.
IMPRESSION: 1. Interval extubation.
2. The tip of the Swan-Ganz catheter overlies the right lower lobe
pulmonary artery.
3. Otherwise, stable and satisfactory support apparatus.
4. Improved bibasilar atelectasis.
